# Patient Record
Sex: Female | Born: 1945 | Race: White | Hispanic: No | Smoking: Never smoker
Health system: Southern US, Community
[De-identification: ages and names within clinical notes are randomized; demographics above are authoritative.]

## PROBLEM LIST (undated history)

## (undated) DIAGNOSIS — G4730 Sleep apnea, unspecified: Secondary | ICD-10-CM

## (undated) DIAGNOSIS — M3500 Sicca syndrome, unspecified: Secondary | ICD-10-CM

## (undated) DIAGNOSIS — I509 Heart failure, unspecified: Secondary | ICD-10-CM

## (undated) DIAGNOSIS — E039 Hypothyroidism, unspecified: Secondary | ICD-10-CM

## (undated) DIAGNOSIS — F419 Anxiety disorder, unspecified: Secondary | ICD-10-CM

## (undated) DIAGNOSIS — H9190 Unspecified hearing loss, unspecified ear: Secondary | ICD-10-CM

## (undated) DIAGNOSIS — K219 Gastro-esophageal reflux disease without esophagitis: Secondary | ICD-10-CM

## (undated) DIAGNOSIS — M797 Fibromyalgia: Secondary | ICD-10-CM

## (undated) DIAGNOSIS — K629 Disease of anus and rectum, unspecified: Secondary | ICD-10-CM

## (undated) DIAGNOSIS — I7300 Raynaud's syndrome without gangrene: Secondary | ICD-10-CM

## (undated) DIAGNOSIS — R87629 Unspecified abnormal cytological findings in specimens from vagina: Secondary | ICD-10-CM

## (undated) DIAGNOSIS — R5383 Other fatigue: Secondary | ICD-10-CM

## (undated) DIAGNOSIS — F319 Bipolar disorder, unspecified: Secondary | ICD-10-CM

## (undated) DIAGNOSIS — M069 Rheumatoid arthritis, unspecified: Secondary | ICD-10-CM

## (undated) DIAGNOSIS — R102 Pelvic and perineal pain: Secondary | ICD-10-CM

## (undated) DIAGNOSIS — E785 Hyperlipidemia, unspecified: Secondary | ICD-10-CM

## (undated) DIAGNOSIS — I519 Heart disease, unspecified: Secondary | ICD-10-CM

## (undated) DIAGNOSIS — K589 Irritable bowel syndrome without diarrhea: Secondary | ICD-10-CM

## (undated) DIAGNOSIS — I209 Angina pectoris, unspecified: Secondary | ICD-10-CM

## (undated) DIAGNOSIS — F32A Depression, unspecified: Secondary | ICD-10-CM

## (undated) DIAGNOSIS — I2510 Atherosclerotic heart disease of native coronary artery without angina pectoris: Secondary | ICD-10-CM

## (undated) DIAGNOSIS — F329 Major depressive disorder, single episode, unspecified: Secondary | ICD-10-CM

## (undated) HISTORY — DX: Disease of anus and rectum, unspecified: K62.9

## (undated) HISTORY — DX: Hyperlipidemia, unspecified: E78.5

## (undated) HISTORY — DX: Pelvic and perineal pain: R10.2

## (undated) HISTORY — DX: Sleep apnea, unspecified: G47.30

## (undated) HISTORY — DX: Hypothyroidism, unspecified: E03.9

## (undated) HISTORY — DX: Heart failure, unspecified: I50.9

## (undated) HISTORY — DX: Bipolar disorder, unspecified: F31.9

## (undated) HISTORY — DX: Raynaud's syndrome without gangrene: I73.00

## (undated) HISTORY — DX: Heart disease, unspecified: I51.9

## (undated) HISTORY — DX: Fibromyalgia: M79.7

## (undated) HISTORY — DX: Gastro-esophageal reflux disease without esophagitis: K21.9

## (undated) HISTORY — DX: Other fatigue: R53.83

## (undated) HISTORY — DX: Major depressive disorder, single episode, unspecified: F32.9

## (undated) HISTORY — DX: Rheumatoid arthritis, unspecified: M06.9

## (undated) HISTORY — PX: TONSILLECTOMY: SUR1361

## (undated) HISTORY — DX: Depression, unspecified: F32.A

## (undated) HISTORY — PX: EYE SURGERY: SHX253

## (undated) HISTORY — DX: Unspecified abnormal cytological findings in specimens from vagina: R87.629

## (undated) HISTORY — DX: Anxiety disorder, unspecified: F41.9

---

## 2003-12-28 ENCOUNTER — Ambulatory Visit: Payer: Self-pay | Admitting: Internal Medicine

## 2004-04-24 ENCOUNTER — Ambulatory Visit: Payer: Self-pay | Admitting: Psychiatry

## 2004-05-14 ENCOUNTER — Ambulatory Visit: Payer: Self-pay | Admitting: Internal Medicine

## 2004-05-22 ENCOUNTER — Ambulatory Visit: Payer: Self-pay | Admitting: Internal Medicine

## 2004-05-28 ENCOUNTER — Ambulatory Visit: Payer: Self-pay | Admitting: Psychiatry

## 2004-06-03 ENCOUNTER — Ambulatory Visit: Payer: Self-pay | Admitting: Psychiatry

## 2004-06-04 ENCOUNTER — Inpatient Hospital Stay: Payer: Self-pay | Admitting: Psychiatry

## 2004-06-28 ENCOUNTER — Ambulatory Visit: Payer: Self-pay | Admitting: Psychiatry

## 2004-07-16 ENCOUNTER — Ambulatory Visit: Payer: Self-pay | Admitting: Psychiatry

## 2004-08-21 ENCOUNTER — Ambulatory Visit: Payer: Self-pay | Admitting: Psychiatry

## 2004-09-24 ENCOUNTER — Ambulatory Visit: Payer: Self-pay | Admitting: Psychiatry

## 2004-10-06 ENCOUNTER — Ambulatory Visit: Payer: Self-pay | Admitting: Internal Medicine

## 2004-12-01 ENCOUNTER — Ambulatory Visit: Payer: Self-pay | Admitting: Psychiatry

## 2004-12-04 ENCOUNTER — Ambulatory Visit: Payer: Self-pay | Admitting: Psychiatry

## 2004-12-22 ENCOUNTER — Ambulatory Visit: Payer: Self-pay

## 2005-01-05 ENCOUNTER — Ambulatory Visit: Payer: Self-pay | Admitting: Internal Medicine

## 2005-01-22 ENCOUNTER — Ambulatory Visit: Payer: Self-pay | Admitting: Internal Medicine

## 2005-01-22 ENCOUNTER — Ambulatory Visit: Payer: Self-pay | Admitting: Psychiatry

## 2005-02-24 ENCOUNTER — Ambulatory Visit: Payer: Self-pay

## 2005-03-18 ENCOUNTER — Ambulatory Visit: Payer: Self-pay | Admitting: Psychiatry

## 2005-03-23 ENCOUNTER — Inpatient Hospital Stay: Payer: Self-pay | Admitting: Psychiatry

## 2005-04-24 ENCOUNTER — Ambulatory Visit: Payer: Self-pay

## 2005-04-24 ENCOUNTER — Ambulatory Visit: Payer: Self-pay | Admitting: Internal Medicine

## 2005-04-30 ENCOUNTER — Ambulatory Visit: Payer: Self-pay | Admitting: Internal Medicine

## 2005-05-14 ENCOUNTER — Inpatient Hospital Stay: Payer: Self-pay | Admitting: Psychiatry

## 2005-05-27 ENCOUNTER — Ambulatory Visit: Payer: Self-pay | Admitting: Psychiatry

## 2005-07-25 ENCOUNTER — Ambulatory Visit: Payer: Self-pay | Admitting: Internal Medicine

## 2005-07-31 ENCOUNTER — Ambulatory Visit: Payer: Self-pay | Admitting: Internal Medicine

## 2005-08-27 ENCOUNTER — Ambulatory Visit: Payer: Self-pay | Admitting: Psychiatry

## 2005-09-02 ENCOUNTER — Ambulatory Visit: Payer: Self-pay

## 2005-09-29 ENCOUNTER — Ambulatory Visit: Payer: Self-pay | Admitting: Psychiatry

## 2005-10-27 ENCOUNTER — Ambulatory Visit: Payer: Self-pay | Admitting: Psychiatry

## 2005-11-17 ENCOUNTER — Ambulatory Visit: Payer: Self-pay | Admitting: Internal Medicine

## 2005-12-04 ENCOUNTER — Ambulatory Visit: Payer: Self-pay | Admitting: Psychiatry

## 2006-01-06 ENCOUNTER — Inpatient Hospital Stay: Payer: Self-pay | Admitting: Psychiatry

## 2006-01-19 ENCOUNTER — Emergency Department: Payer: Self-pay | Admitting: Emergency Medicine

## 2006-02-16 ENCOUNTER — Ambulatory Visit: Payer: Self-pay | Admitting: Internal Medicine

## 2006-03-29 ENCOUNTER — Ambulatory Visit: Payer: Self-pay | Admitting: Internal Medicine

## 2006-04-23 ENCOUNTER — Ambulatory Visit: Payer: Self-pay

## 2006-05-21 ENCOUNTER — Ambulatory Visit: Payer: Self-pay | Admitting: Psychiatry

## 2006-07-14 ENCOUNTER — Ambulatory Visit: Payer: Self-pay | Admitting: Psychiatry

## 2006-08-27 ENCOUNTER — Ambulatory Visit: Payer: Self-pay | Admitting: Internal Medicine

## 2006-10-09 ENCOUNTER — Inpatient Hospital Stay: Payer: Self-pay | Admitting: Unknown Physician Specialty

## 2006-10-28 ENCOUNTER — Inpatient Hospital Stay: Payer: Self-pay | Admitting: Psychiatry

## 2006-10-28 ENCOUNTER — Other Ambulatory Visit: Payer: Self-pay

## 2006-11-19 ENCOUNTER — Ambulatory Visit: Payer: Self-pay | Admitting: Podiatry

## 2006-11-26 ENCOUNTER — Ambulatory Visit: Payer: Self-pay | Admitting: Internal Medicine

## 2007-02-13 ENCOUNTER — Inpatient Hospital Stay: Payer: Self-pay | Admitting: Psychiatry

## 2007-03-21 ENCOUNTER — Ambulatory Visit: Payer: Self-pay

## 2007-04-11 ENCOUNTER — Ambulatory Visit: Payer: Self-pay

## 2007-04-17 ENCOUNTER — Emergency Department: Payer: Self-pay | Admitting: Emergency Medicine

## 2007-05-31 ENCOUNTER — Encounter: Payer: Self-pay | Admitting: Sports Medicine

## 2007-06-07 ENCOUNTER — Ambulatory Visit: Payer: Self-pay | Admitting: Neurology

## 2007-06-08 ENCOUNTER — Encounter: Payer: Self-pay | Admitting: Sports Medicine

## 2007-07-08 ENCOUNTER — Encounter: Payer: Self-pay | Admitting: Sports Medicine

## 2007-08-02 ENCOUNTER — Ambulatory Visit: Payer: Self-pay | Admitting: Cardiovascular Disease

## 2007-08-06 ENCOUNTER — Emergency Department: Payer: Self-pay | Admitting: Emergency Medicine

## 2007-08-06 ENCOUNTER — Other Ambulatory Visit: Payer: Self-pay

## 2007-09-02 ENCOUNTER — Inpatient Hospital Stay: Payer: Self-pay | Admitting: Podiatry

## 2008-02-23 ENCOUNTER — Ambulatory Visit: Payer: Self-pay | Admitting: Internal Medicine

## 2008-03-27 ENCOUNTER — Ambulatory Visit: Payer: Self-pay | Admitting: Internal Medicine

## 2008-06-27 ENCOUNTER — Inpatient Hospital Stay: Payer: Self-pay | Admitting: Psychiatry

## 2008-07-20 ENCOUNTER — Ambulatory Visit: Payer: Self-pay | Admitting: Urology

## 2008-07-25 ENCOUNTER — Ambulatory Visit: Payer: Self-pay | Admitting: Pain Medicine

## 2008-08-16 ENCOUNTER — Ambulatory Visit: Payer: Self-pay | Admitting: Internal Medicine

## 2008-08-21 ENCOUNTER — Ambulatory Visit: Payer: Self-pay | Admitting: Pain Medicine

## 2008-08-23 ENCOUNTER — Ambulatory Visit: Payer: Self-pay | Admitting: Unknown Physician Specialty

## 2008-09-14 ENCOUNTER — Inpatient Hospital Stay: Payer: Self-pay | Admitting: Podiatry

## 2008-10-23 ENCOUNTER — Ambulatory Visit: Payer: Self-pay | Admitting: Pain Medicine

## 2008-10-28 ENCOUNTER — Emergency Department: Payer: Self-pay | Admitting: Unknown Physician Specialty

## 2008-11-07 ENCOUNTER — Ambulatory Visit: Payer: Self-pay | Admitting: Physician Assistant

## 2008-11-27 ENCOUNTER — Ambulatory Visit: Payer: Self-pay | Admitting: Pain Medicine

## 2009-03-05 ENCOUNTER — Ambulatory Visit: Payer: Self-pay | Admitting: Internal Medicine

## 2010-04-07 ENCOUNTER — Ambulatory Visit: Payer: Self-pay | Admitting: Internal Medicine

## 2010-10-03 ENCOUNTER — Observation Stay: Payer: Self-pay | Admitting: Internal Medicine

## 2011-04-01 ENCOUNTER — Encounter: Payer: Self-pay | Admitting: Rheumatology

## 2011-04-10 ENCOUNTER — Encounter: Payer: Self-pay | Admitting: Rheumatology

## 2011-04-22 ENCOUNTER — Ambulatory Visit: Payer: Self-pay | Admitting: Internal Medicine

## 2011-05-08 ENCOUNTER — Encounter: Payer: Self-pay | Admitting: Rheumatology

## 2011-10-15 DIAGNOSIS — S61209A Unspecified open wound of unspecified finger without damage to nail, initial encounter: Secondary | ICD-10-CM | POA: Insufficient documentation

## 2011-10-15 DIAGNOSIS — L259 Unspecified contact dermatitis, unspecified cause: Secondary | ICD-10-CM | POA: Insufficient documentation

## 2011-12-14 ENCOUNTER — Ambulatory Visit: Payer: Self-pay | Admitting: Internal Medicine

## 2011-12-14 DIAGNOSIS — R05 Cough: Secondary | ICD-10-CM | POA: Insufficient documentation

## 2011-12-14 DIAGNOSIS — R059 Cough, unspecified: Secondary | ICD-10-CM | POA: Insufficient documentation

## 2012-01-07 DIAGNOSIS — Z23 Encounter for immunization: Secondary | ICD-10-CM | POA: Insufficient documentation

## 2012-01-07 DIAGNOSIS — E039 Hypothyroidism, unspecified: Secondary | ICD-10-CM | POA: Insufficient documentation

## 2012-03-15 ENCOUNTER — Ambulatory Visit: Payer: Self-pay | Admitting: Internal Medicine

## 2012-03-17 ENCOUNTER — Ambulatory Visit: Payer: Self-pay | Admitting: Internal Medicine

## 2012-04-28 ENCOUNTER — Ambulatory Visit: Payer: Self-pay | Admitting: Internal Medicine

## 2012-05-05 DIAGNOSIS — K219 Gastro-esophageal reflux disease without esophagitis: Secondary | ICD-10-CM | POA: Insufficient documentation

## 2012-07-19 ENCOUNTER — Encounter: Payer: Self-pay | Admitting: Rheumatology

## 2013-05-01 ENCOUNTER — Ambulatory Visit: Payer: Self-pay | Admitting: Family Medicine

## 2013-05-30 ENCOUNTER — Ambulatory Visit: Payer: Self-pay | Admitting: Gastroenterology

## 2013-06-22 DIAGNOSIS — M069 Rheumatoid arthritis, unspecified: Secondary | ICD-10-CM | POA: Insufficient documentation

## 2013-06-29 DIAGNOSIS — Z79899 Other long term (current) drug therapy: Secondary | ICD-10-CM | POA: Insufficient documentation

## 2014-01-10 DIAGNOSIS — I10 Essential (primary) hypertension: Secondary | ICD-10-CM | POA: Insufficient documentation

## 2014-01-10 DIAGNOSIS — I2510 Atherosclerotic heart disease of native coronary artery without angina pectoris: Secondary | ICD-10-CM | POA: Insufficient documentation

## 2014-05-02 ENCOUNTER — Ambulatory Visit: Payer: Self-pay | Admitting: Family Medicine

## 2014-05-28 ENCOUNTER — Emergency Department: Payer: Self-pay | Admitting: Emergency Medicine

## 2014-08-29 ENCOUNTER — Other Ambulatory Visit: Payer: Self-pay

## 2014-08-29 DIAGNOSIS — G894 Chronic pain syndrome: Secondary | ICD-10-CM | POA: Insufficient documentation

## 2014-08-29 DIAGNOSIS — M797 Fibromyalgia: Secondary | ICD-10-CM | POA: Insufficient documentation

## 2014-08-29 DIAGNOSIS — Z8719 Personal history of other diseases of the digestive system: Secondary | ICD-10-CM | POA: Insufficient documentation

## 2014-08-29 DIAGNOSIS — M069 Rheumatoid arthritis, unspecified: Secondary | ICD-10-CM | POA: Insufficient documentation

## 2014-08-29 DIAGNOSIS — M3500 Sicca syndrome, unspecified: Secondary | ICD-10-CM | POA: Insufficient documentation

## 2014-08-29 DIAGNOSIS — F411 Generalized anxiety disorder: Secondary | ICD-10-CM | POA: Insufficient documentation

## 2014-08-29 DIAGNOSIS — F3112 Bipolar disorder, current episode manic without psychotic features, moderate: Secondary | ICD-10-CM | POA: Insufficient documentation

## 2014-08-29 DIAGNOSIS — F3174 Bipolar disorder, in full remission, most recent episode manic: Secondary | ICD-10-CM | POA: Insufficient documentation

## 2014-08-29 DIAGNOSIS — F605 Obsessive-compulsive personality disorder: Secondary | ICD-10-CM | POA: Insufficient documentation

## 2014-08-29 DIAGNOSIS — F3173 Bipolar disorder, in partial remission, most recent episode manic: Secondary | ICD-10-CM | POA: Insufficient documentation

## 2014-08-29 DIAGNOSIS — F450 Somatization disorder: Secondary | ICD-10-CM | POA: Insufficient documentation

## 2014-08-29 DIAGNOSIS — Z8639 Personal history of other endocrine, nutritional and metabolic disease: Secondary | ICD-10-CM | POA: Insufficient documentation

## 2014-08-29 DIAGNOSIS — F319 Bipolar disorder, unspecified: Secondary | ICD-10-CM

## 2014-08-29 DIAGNOSIS — G4700 Insomnia, unspecified: Secondary | ICD-10-CM | POA: Insufficient documentation

## 2014-08-29 MED ORDER — DIVALPROEX SODIUM ER 500 MG PO TB24: 500.0000 mg | ORAL_TABLET | Freq: Two times a day (BID) | ORAL | Status: DC

## 2014-08-29 MED ORDER — CARBAMAZEPINE 100 MG PO CHEW: 600.0000 mg | CHEWABLE_TABLET | Freq: Every day | ORAL | Status: DC

## 2014-08-29 MED ORDER — GABAPENTIN 300 MG PO CAPS: 300.0000 mg | ORAL_CAPSULE | Freq: Two times a day (BID) | ORAL | Status: DC

## 2014-08-29 NOTE — Telephone Encounter (Signed)
Have provided 90 day refills on patient's gabapentin, carbamazepine and divalproex the patient has follow-up appointments scheduled.

## 2014-08-29 NOTE — Telephone Encounter (Signed)
pt needs refill on gabapentin , carbamazepine, divalproex.  pt made appt for 10-03-14

## 2014-08-30 ENCOUNTER — Other Ambulatory Visit: Payer: Self-pay

## 2014-08-30 MED ORDER — GABAPENTIN 300 MG PO CAPS: 300.0000 mg | ORAL_CAPSULE | Freq: Two times a day (BID) | ORAL | Status: DC

## 2014-08-30 MED ORDER — DIVALPROEX SODIUM ER 500 MG PO TB24: 500.0000 mg | ORAL_TABLET | Freq: Two times a day (BID) | ORAL | Status: DC

## 2014-08-30 MED ORDER — ILOPERIDONE 6 MG PO TABS: 0.5000 | ORAL_TABLET | Freq: Every day | ORAL | Status: DC

## 2014-08-30 NOTE — Telephone Encounter (Signed)
needs refill on divalproex sodium er 500mg   take 1 table two times a day

## 2014-09-12 ENCOUNTER — Telehealth: Payer: Self-pay | Admitting: Obstetrics and Gynecology

## 2014-09-12 NOTE — Telephone Encounter (Signed)
WANTED TO FIND OUT HER DIAGNOSES OF WHAT DR DE TOLD HER ABOUT HER BX. [place] PHONE FIRST

## 2014-09-12 NOTE — Telephone Encounter (Signed)
Pt given info about her gluteal bx from 04/2013. Per her request I mailed a copy of the pathology.

## 2014-09-24 ENCOUNTER — Telehealth: Payer: Self-pay

## 2014-09-24 DIAGNOSIS — R0681 Apnea, not elsewhere classified: Secondary | ICD-10-CM | POA: Insufficient documentation

## 2014-09-24 NOTE — Telephone Encounter (Signed)
pt called states that she has a up coming dental appt where she had got to get some work done and she states she did not have a good experience with one she had done before but she going to see a different dentist this time but she still feels increase anxiety, fatigue, and weakness and she wonders if it is her anxiety working on her can you advise if she needs to increase her medication during this time period ?

## 2014-09-25 NOTE — Telephone Encounter (Signed)
Spoke with patient today about her issue. She indicated her plan would be to use the lorazepam twice a day as it is written. He states she might make this increase sometime around July 21 because her dental appointment is on July 22. She states in the past she was typically taking 1 pill once a day. She stated as the message indicated that she is having some increased anxiety from her upcoming dental appointment given she has had bad experience with a dentist recently. She does state she is under the care of her urologist and they're planning some workup fair. She states that her symptoms of anxiety are fatigue, anxiety. Was aware that the symptoms she is having could be symptoms of both, however she has some upcoming tests with her cardiologist for which she states her be a chemically induced stress test. I encouraged her to call any questions concerns prior to her next point. AW

## 2014-10-03 ENCOUNTER — Encounter: Payer: Self-pay | Admitting: Psychiatry

## 2014-10-03 ENCOUNTER — Ambulatory Visit (INDEPENDENT_AMBULATORY_CARE_PROVIDER_SITE_OTHER): Payer: 59 | Admitting: Psychiatry

## 2014-10-03 VITALS — BP 104/70 | HR 78 | Temp 98.5°F | Ht 60.0 in | Wt 114.4 lb

## 2014-10-03 DIAGNOSIS — E782 Mixed hyperlipidemia: Secondary | ICD-10-CM | POA: Insufficient documentation

## 2014-10-03 DIAGNOSIS — F3110 Bipolar disorder, current episode manic without psychotic features, unspecified: Secondary | ICD-10-CM | POA: Diagnosis not present

## 2014-10-03 NOTE — Progress Notes (Signed)
[facility] MD/PA/NP OP Progress Note  10/03/2014 11:06 AM Stephanie [name]  MRN:  [id number]  Subjective:  Patient returns for follow-up of her bipolar disorder and generalized anxiety disorder. She states overall things are going well and she denies any side effects or medications. She stated the most recent stressor is that last week on the 21st she fell in a ditch while she was trying to retrieve her mail. She states that the mailboxes and a ditch. I did spend some time discussing with her benzodiazepines and potentially affecting balance. However, it appears the current situation was secondary to positioning issue related to her mailbox. She asked about perhaps getting some physician support for having her mailbox moved. I encouraged her to investigate with the post office what options there are and if there possibilities for handicapped access to [place]. I stated if she did discover any paperwork we would then have to decide whether it would make more sense for her primary care to address any physical limitations versus this Probation officer.  He related that her dental experience last week was one of the best of her life. She states that she felt like the [place] she saw saved one of her teeth and cleaned stains off of some other teeth without any charge. She states she was pleased with the experience. She states otherwise she continues her routine of swimming and being active. Chief Complaint: "fell" Chief Complaint    Follow-up; Anxiety     Visit Diagnosis:  No diagnosis found.  Past Medical History:  Past Medical History  Diagnosis Date  . Anxiety   . Bipolar disorder     Past Surgical History  Procedure Laterality Date  . Tonsillectomy     Family History:  Family History  Problem Relation Age of Onset  . Heart failure Father   . Parkinson's disease Mother   . Bipolar disorder Mother   . Depression Sister   . Schizophrenia Paternal Grandmother    Social History:  History   Social History   . Marital Status: Divorced    Spouse Name: N/A  . Number of Children: N/A  . Years of Education: N/A   Social History Main Topics  . Smoking status: Never Smoker   . Smokeless tobacco: Former Systems developer  . Alcohol Use: No  . Drug Use: No  . Sexual Activity: No   Other Topics Concern  . None   Social History Narrative  . None   Additional History:   Assessment:   Musculoskeletal: Strength & Muscle Tone: within normal limits Gait & Station: unsteady, Walking with a cane Patient leans: N/A  Psychiatric Specialty Exam: HPI  ROS  Blood pressure 104/70, pulse 78, temperature 98.5 F (36.9 C), temperature source Tympanic, height 5' (1.524 m), weight 114 lb 6.4 oz (51.891 kg), SpO2 92 %.Body mass index is 22.34 kg/(m^2).  General Appearance: Neat and Well Groomed  Eye Contact:  Good  Speech:  Normal Rate  Volume:  Normal  Mood:  Good  Affect:  Congruent  Thought Process:  Linear and Logical  Orientation:  Full (Time, Place, and Person)  Thought Content:  Negative  Suicidal Thoughts:  No  Homicidal Thoughts:  No  Memory:  Immediate;   Good Recent;   Good Remote;   Good  Judgement:  Good  Insight:  Good  Psychomotor Activity:  Negative  Concentration:  Good  Recall:  Good  Fund of Knowledge: Good  Language: Good  Akathisia:  Negative  Handed:  Right unknown  AIMS (if indicated):  Done in April 2016, normal  Assets:  Armed forces logistics/support/administrative officer Desire for Improvement Social Support  ADL's:  Intact  Cognition: WNL  Sleep:  good   Is the patient at risk to self?  No. Has the patient been a risk to self in the past 6 months?  No. Has the patient been a risk to self within the distant past?  No. Is the patient a risk to others?  No. Has the patient been a risk to others in the past 6 months?  No. Has the patient been a risk to others within the distant past?  No.  Current Medications: Current Outpatient Prescriptions  Medication Sig Dispense Refill  . abatacept  (ORENCIA) 250 MG injection Inject into the vein.    Marland Kitchen amoxicillin (AMOXIL) 500 MG capsule     . aspirin 81 MG tablet Take by mouth.    Marland Kitchen atorvastatin (LIPITOR) 20 MG tablet Take by mouth.    . Calcium Citrate-Vitamin D ([employer] CALCIUM CITRATE +D3 MINI) 200-250 MG-UNIT TABS Take by mouth.    Marland Kitchen CALCIUM CITRATE-VITAMIN D Take by mouth.    . carbamazepine (TEGRETOL) 100 MG chewable tablet Chew 6 tablets (600 mg total) by mouth at bedtime. 540 tablet 0  . divalproex (DEPAKOTE ER) 500 MG 24 hr tablet Take 1 tablet (500 mg total) by mouth 2 (two) times daily. 465 tablet 0  . folic acid (FOLVITE) 1 MG tablet     . gabapentin (NEURONTIN) 300 MG capsule Take 1 capsule (300 mg total) by mouth 2 (two) times daily. 180 capsule 0  . Iloperidone (FANAPT) 6 MG TABS Take 0.5 tablets (3 mg total) by mouth at bedtime. 45 tablet 0  . levothyroxine (SYNTHROID) 75 MCG tablet Take by mouth.    Marland Kitchen LORazepam (ATIVAN) 0.5 MG tablet Take by mouth.    . methotrexate 2.5 MG tablet Take by mouth.    . MULTIPLE VITAMIN PO Take by mouth.    . nitroGLYCERIN (NITROSTAT) 0.4 MG SL tablet Place under the tongue.    . ondansetron (ZOFRAN) 8 MG tablet Take by mouth.    . ranitidine (ZANTAC) 150 MG tablet Take by mouth.    . risedronate (ACTONEL) 150 MG tablet Take by mouth.    . traZODone (DESYREL) 50 MG tablet Take by mouth.    . carbamazepine (TEGRETOL-XR) 100 MG 12 hr tablet Take by mouth.     No current facility-administered medications for this visit.    Medical Decision Making:  Established Problem, Stable/Improving (1) and Review of Medication Regimen & Side Effects (2)  Treatment Plan Summary:Medication management and Plan We'll continue patient on her medications without any changes. She will continue the trazodone 75 mg at bedtime, Neurontin 300 mg twice daily, Ativan 0.5 mg twice daily as needed, Fanapt 3 mg daily, Tegretol chewable tablet 600 mg at bedtime and she'll follow up in 3 months. She's been encouraged call  any questions or concerns prior to her next appointment.   [name] 10/03/2014, 11:06 AM

## 2014-10-15 ENCOUNTER — Other Ambulatory Visit: Payer: Self-pay

## 2014-10-15 DIAGNOSIS — G4700 Insomnia, unspecified: Secondary | ICD-10-CM

## 2014-10-15 MED ORDER — TRAZODONE HCL 50 MG PO TABS: ORAL_TABLET | ORAL | Status: DC

## 2014-10-15 NOTE — Telephone Encounter (Signed)
Received another request for trazondone.

## 2014-10-15 NOTE — Telephone Encounter (Signed)
Ordered 90 day supply of trazodone. Trazodone 50 mg #135, no refills.

## 2014-10-15 NOTE — Telephone Encounter (Signed)
received a request for medication refill on trazodone hcl 50 mg tabe #135 take 1 and 1/2 tabets by mouth at bedtimes. pt last seen o 10-03-14 next appt  01-02-15.

## 2014-10-17 ENCOUNTER — Other Ambulatory Visit: Payer: Self-pay

## 2014-10-17 MED ORDER — ILOPERIDONE 6 MG PO TABS: 0.5000 | ORAL_TABLET | Freq: Every day | ORAL | Status: DC

## 2014-10-17 NOTE — Telephone Encounter (Signed)
left message that rx was sent in

## 2014-10-17 NOTE — Telephone Encounter (Signed)
pt called left message on lea phone that she needed refill on fanapt -generic illiperidone.  Pt last seen on 10-03-14 next appt 01-02-15

## 2014-10-18 ENCOUNTER — Telehealth: Payer: Self-pay | Admitting: Psychiatry

## 2014-10-19 MED ORDER — ILOPERIDONE 6 MG PO TABS: 0.5000 | ORAL_TABLET | Freq: Every day | ORAL | Status: DC

## 2014-10-19 NOTE — Telephone Encounter (Signed)
Patient has indicated she's aware that this is generic available for her [place]. He would like to try generic due to cost saving issues. I've put a note to use generic on her prescription. She does not need any of the medication at this time as she recently got it refilled one week ago. However the note is on her prescription when it is sent through the next time. AW

## 2014-10-22 ENCOUNTER — Telehealth: Payer: Self-pay

## 2014-10-22 MED ORDER — QUETIAPINE FUMARATE 25 MG PO TABS: 25.0000 mg | ORAL_TABLET | Freq: Every day | ORAL | Status: DC

## 2014-10-22 NOTE — Telephone Encounter (Signed)
pt states that the fanapt it is $120 and she can not afford it.  the pharmacy said that seroquel maybe more affordable if dr. Jimmye Norman wanted to try that instead.  please advise what to do about pt fanapt can not afford.?

## 2014-10-22 NOTE — Telephone Encounter (Signed)
Spoke with patient and she indicated that the cost of the [place] is too much for her. She explored the other tier 1 options with her insurance coming in these included olanzapine, haloperidol and quetiapine. 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 I discussed that of the 3 at this time the best choice would be quetiapine. We'll start 25 mg at bedtime. Patient is been encouraged to contact the clinic and make an earlier appointment than are currently scheduled on October appointment. She indicated she would do this. AW

## 2014-10-22 NOTE — Telephone Encounter (Signed)
the prior authorization was approved but the tier reductions was not approved. Pt will have to pay full price for medication

## 2014-10-22 NOTE — Telephone Encounter (Signed)
spoke with patient she has about a 8 day supply left.

## 2014-11-02 ENCOUNTER — Telehealth: Payer: Self-pay

## 2014-11-02 NOTE — Telephone Encounter (Signed)
pt wants to find out if she should stay on the ativan or start tapering off of it.

## 2014-11-05 NOTE — Telephone Encounter (Signed)
pt was given instructions

## 2014-11-08 ENCOUNTER — Other Ambulatory Visit: Payer: Self-pay

## 2014-11-08 MED ORDER — CARBAMAZEPINE 100 MG PO CHEW: 600.0000 mg | CHEWABLE_TABLET | Freq: Every day | ORAL | Status: DC

## 2014-11-08 NOTE — Telephone Encounter (Signed)
request for refill on carbamazepine 100mg 

## 2014-11-08 NOTE — Telephone Encounter (Signed)
Medication has been ordered, carbamazepine chewable 100 mg, #540 with no refills. AW

## 2014-11-09 NOTE — Telephone Encounter (Signed)
busy no message could be left.

## 2014-12-05 ENCOUNTER — Ambulatory Visit (INDEPENDENT_AMBULATORY_CARE_PROVIDER_SITE_OTHER): Payer: 59 | Admitting: Psychiatry

## 2014-12-05 ENCOUNTER — Encounter: Payer: Self-pay | Admitting: Psychiatry

## 2014-12-05 VITALS — BP 118/76 | HR 67 | Temp 97.5°F | Ht 60.0 in | Wt 116.6 lb

## 2014-12-05 DIAGNOSIS — G4700 Insomnia, unspecified: Secondary | ICD-10-CM

## 2014-12-05 MED ORDER — TRAZODONE HCL 50 MG PO TABS
ORAL_TABLET | ORAL | Status: DC
Start: 2014-12-05 — End: 2015-02-20

## 2014-12-05 MED ORDER — QUETIAPINE FUMARATE 25 MG PO TABS: 25.0000 mg | ORAL_TABLET | Freq: Every day | ORAL | Status: DC

## 2014-12-05 MED ORDER — LORAZEPAM 0.5 MG PO TABS: ORAL_TABLET | ORAL | Status: DC

## 2014-12-05 NOTE — Progress Notes (Signed)
[facility] MD/PA/NP OP Progress Note  12/05/2014 10:53 AM Stephanie [name]  MRN:  [id number]  Subjective:  Patient returns for follow-up of her bipolar disorder and generalized anxiety disorder. She states things continue to go well. She states she continues to be physically active doing swimming 5 times a week. She states that her sleep is good. Due to cost we had switched the patient from her for nap to [place] ". She states things are going well with the Seroquel. She does state that when she took the Seroquel with her trazodone she was too sedated and groggy the next day and that she cut her trazodone dose down to 25 mg at bedtime.  I discussed with her that ideally it would be best if we could get her off of the Ativan. We had increased the dose when she was dealing with significant anxiety related to dental care. However she states she's found a new dentist and is no longer having that issue. I discussed with her that she could perhaps take a half of her 0.5 mg tablet for 2 weeks and then simply have it to use as needed.  I discussed with her I need to get some metabolic labs, Depakote level and Tegretol level. I've given her a laboratory slip to do so. She did she has an upcoming physical with her primary care physician and perhaps could get these done then. I stated that if she had this exam coming up within the next month or 2 that would be fine but if not she should go have the labs assessed regardless of the appointment. Chief Complaint: good  Visit Diagnosis:     ICD-9-CM ICD-10-CM   1. 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 Anxiety   . Bipolar disorder     Past Surgical History  Procedure Laterality Date  . Tonsillectomy     Family History:  Family History  Problem Relation Age of Onset  . Heart failure Father   . Parkinson's disease Mother   . Bipolar  disorder Mother   . Depression Sister   . Schizophrenia Paternal Grandmother    Social History:  Social History   Social History  . Marital Status: Divorced    Spouse Name: N/A  . Number of Children: N/A  . Years of Education: N/A   Social History Main Topics  . Smoking status: Never Smoker   . Smokeless tobacco: Former Systems developer  . Alcohol Use: No  . Drug Use: No  . Sexual Activity: No   Other Topics Concern  . None   Social History Narrative   Additional History:   Assessment:   Musculoskeletal: Strength & Muscle Tone: within normal limits Gait & Station: unsteady, Walking with a cane Patient leans: N/A  Psychiatric Specialty Exam: HPI  Review of Systems  Psychiatric/Behavioral: Negative for depression, suicidal ideas, hallucinations, memory loss and substance abuse. The patient is not nervous/anxious and does not have insomnia.     Blood pressure 118/76, pulse 67, temperature 97.5 F (36.4 C), temperature source Tympanic, height 5' (1.524 m), weight 116 lb 9.6 oz (52.889 kg), SpO2 93 %.Body mass index is 22.77 kg/(m^2).  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  No. Has the patient been a risk to self in the past 6 months?  No. Has the patient been a risk  to self within the distant past?  No. Is the patient a risk to others?  No. Has the patient been a risk to others in the past 6 months?  No. Has the patient been a risk to others within the distant past?  No.  Current Medications: Current Outpatient Prescriptions  Medication Sig Dispense Refill  . abatacept (ORENCIA) 250 MG injection Inject into the vein.    Marland Kitchen aspirin 81 MG tablet Take by mouth.    . Calcium Citrate-Vitamin D (CVS CALCIUM CITRATE +D3 MINI) 200-250 MG-UNIT TABS Take by mouth.    . carbamazepine (TEGRETOL) 100 MG chewable tablet Chew 6 tablets (600 mg total) by mouth at bedtime. 540 tablet 0  . divalproex (DEPAKOTE ER) 500 MG 24 hr tablet Take 1 tablet (500 mg total) by mouth 2 (two) times daily. 161 tablet 0  . folic acid (FOLVITE) 1 MG tablet     . gabapentin (NEURONTIN) 300 MG capsule Take 1 capsule (300 mg total) by mouth 2 (two) times daily. 180 capsule 0  . levothyroxine (SYNTHROID) 75 MCG tablet Take by mouth.    . methotrexate 2.5 MG tablet Take by mouth.    . MULTIPLE VITAMIN PO Take by mouth.    . nitroGLYCERIN (NITROSTAT) 0.4 MG SL tablet Place under the tongue.    . ondansetron (ZOFRAN) 8 MG tablet Take by mouth.    . QUEtiapine (SEROQUEL) 25 MG tablet Take 1 tablet (25 mg total) by mouth at bedtime. 90 tablet 0  . ranitidine (ZANTAC) 150 MG tablet Take by mouth.    . risedronate (ACTONEL) 150 MG tablet Take by mouth.    . traZODone (DESYREL) 50 MG tablet Take one half a tablet at bedtime as needed for sleep. 90 tablet 0  . LORazepam (ATIVAN) 0.5 MG tablet Take one half a tablet daily for 14 days, THEN use one half a tablet daily AS NEEDED 90 tablet 0   No current facility-administered medications for this visit.    Medical Decision Making:  Established Problem, Stable/Improving (1) and Review of Medication Regimen & Side Effects (2)  Treatment Plan Summary:Medication management and Plan We'll continue patient on her medications without any  changes.  Bipolar disorder- is artery decreased her trazodone 25 mg at bedtime. Continue Neurontin 300 mg twice daily,  Fanapt 3 mg daily, Tegretol chewable tablet 600 mg at bedtime, Depakote 500 mg twice daily and she'll follow up in 3 months. We will continue her at [place] 25 mg at bedtime. Continue this low dose that she reports some sedation and she is on several other mood stabilizers at this time for adequate mood stability.  GAD-I've instructed her to decrease her dose of lorazepam  from 0.5 mg daily to 0.25 millions daily for 14 days and then discontinue regular use of it but continue with a 0.25 dose as needed  Given her a laboratory slip for metabolic labs, Depakote level and Tegretol level.  She's been encouraged call any questions or concerns prior to her next appointment.   [name] 12/05/2014, 10:53 AM

## 2014-12-26 ENCOUNTER — Ambulatory Visit (INDEPENDENT_AMBULATORY_CARE_PROVIDER_SITE_OTHER): Payer: 59 | Admitting: Obstetrics and Gynecology

## 2014-12-26 ENCOUNTER — Encounter: Payer: Self-pay | Admitting: Obstetrics and Gynecology

## 2014-12-26 VITALS — BP 102/68 | HR 68 | Ht 61.0 in | Wt 114.1 lb

## 2014-12-26 DIAGNOSIS — D071 Carcinoma in situ of vulva: Secondary | ICD-10-CM

## 2014-12-26 NOTE — Patient Instructions (Signed)
1. Normal vulvar colposcopy. 2. Return in 1 year for final colposcopy.

## 2014-12-26 NOTE — Progress Notes (Signed)
Chief complaint: 1.  History of severe vulvar dysplasia, status post wide local excision.  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  The patient remains asymptomatic without vulvar itching, burning, or pain.  She has not noted any new lesions on her perineum.  Past medical history, past surgical history, problem list, medications, and allergies are reviewed.  [place] it.  Review of systems: Per HPI.  OBJECTIVE: BP 102/68 mmHg  Pulse 68  Ht 5\' 1"  (1.549 m)  Wt 114 lb 1.6 oz (51.755 kg)  BMI 21.57 kg/m2 Patient is a pleasant elderly female in no acute distress. Pelvic exam: External genitalia-with atrophic changes; no gross lesions. BUS: Normal. Vagina: Atrophic changes.  PROCEDURE: Vulvar colposcopy. Indications: History of VIN 3; status post wide local excision 2015 Findings: No lesions on gross inspection; normal vulvar colposcopy without evidence of abnormality  IMPRESSION: 1.  History of VIN 3, status post wide local excision. 2.  Adequate colposcopy today without evidence of disease.  PLAN: 1.  Return in 1 year for final vulvar colposcopy.  If normal, will return patient to routine annual screening.  A total of 15 minutes were spent face-to-face with the patient during this encounter and over half of that time dealt with counseling and coordination of care.   Brayton Mars, MD  Note: This dictation was prepared with Dragon dictation along with smaller phrase technology. Any transcriptional errors that result from this process are unintentional.

## 2015-01-02 ENCOUNTER — Ambulatory Visit: Payer: Self-pay | Admitting: Psychiatry

## 2015-01-07 ENCOUNTER — Telehealth: Payer: Self-pay | Admitting: Psychiatry

## 2015-01-21 NOTE — Telephone Encounter (Signed)
the [facility] fax # [phone]

## 2015-01-21 NOTE — Telephone Encounter (Signed)
lab orders faxed - confirmed

## 2015-02-05 ENCOUNTER — Telehealth: Payer: Self-pay

## 2015-02-05 NOTE — Telephone Encounter (Signed)
pt wanted to make sure that it was ok to increase her trazodone to 50mg  . pt states that she wake up at night and can not get back to sleep.  pt states that her mind is racing and she has racing thoughts.

## 2015-02-07 NOTE — Telephone Encounter (Signed)
spoke with patient.  pt states that she will increase only on the night she can get to sleep good.

## 2015-02-20 ENCOUNTER — Encounter: Payer: Self-pay | Admitting: Psychiatry

## 2015-02-20 ENCOUNTER — Ambulatory Visit (INDEPENDENT_AMBULATORY_CARE_PROVIDER_SITE_OTHER): Payer: 59 | Admitting: Psychiatry

## 2015-02-20 ENCOUNTER — Telehealth: Payer: Self-pay

## 2015-02-20 ENCOUNTER — Telehealth: Payer: Self-pay | Admitting: Psychiatry

## 2015-02-20 ENCOUNTER — Ambulatory Visit: Payer: 59 | Admitting: Psychiatry

## 2015-02-20 VITALS — BP 122/78 | HR 77 | Temp 97.7°F | Ht 61.0 in | Wt 112.2 lb

## 2015-02-20 DIAGNOSIS — G4700 Insomnia, unspecified: Secondary | ICD-10-CM | POA: Diagnosis not present

## 2015-02-20 DIAGNOSIS — M3500 Sicca syndrome, unspecified: Secondary | ICD-10-CM | POA: Insufficient documentation

## 2015-02-20 MED ORDER — CARBAMAZEPINE 100 MG PO CHEW: 600.0000 mg | CHEWABLE_TABLET | Freq: Every day | ORAL | Status: DC

## 2015-02-20 MED ORDER — TRAZODONE HCL 50 MG PO TABS: ORAL_TABLET | ORAL | Status: DC

## 2015-02-20 MED ORDER — LORAZEPAM 0.5 MG PO TABS: 0.2500 mg | ORAL_TABLET | Freq: Every evening | ORAL | Status: DC | PRN

## 2015-02-20 MED ORDER — QUETIAPINE FUMARATE 25 MG PO TABS: 25.0000 mg | ORAL_TABLET | Freq: Every day | ORAL | Status: DC

## 2015-02-20 MED ORDER — GABAPENTIN 300 MG PO CAPS: 300.0000 mg | ORAL_CAPSULE | Freq: Two times a day (BID) | ORAL | Status: DC

## 2015-02-20 MED ORDER — DIVALPROEX SODIUM ER 500 MG PO TB24: 500.0000 mg | ORAL_TABLET | Freq: Two times a day (BID) | ORAL | Status: DC

## 2015-02-20 NOTE — Progress Notes (Signed)
BH MD/PA/NP OP Progress Note  02/20/2015 12:10 PM Stephanie [name]  MRN:  [id number]  Subjective:  Patient returns for follow-up of her bipolar disorder and generalized anxiety disorder. Psych she feels like the medications have kept her mood stable. She discusses some stress from medical issues. She states that she was having some pain and she thought it was her osteoporosis however she states she's found out it scoliosis. She is getting follow-up for that and does have appointments for some physical therapy.  She states that at this time she is continue take her carbamazepine and Depakote. She states that the trazodone she is only requiring 25 mg at bedtime. We discussed the lorazepam. She states she is only taking a half of a 0.5 mg tablet at bedtime. Because of her issues with falls we have discussed in the past that ultimately been good for her to get off of the medicine. We have agreed that she would continue to have a prescription but only use the 0.25 mg as needed. Patient is agreeable to this and feels since the trazodone is working she may not need any lorazepam at night. Chief Complaint: good Chief Complaint    Follow-up; Medication Refill     Visit Diagnosis:     ICD-9-CM ICD-10-CM   1. 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 Anxiety   . Bipolar disorder ([place])   . Fibromyalgia   . Hypothyroidism   . Heart failure ([place])   . Sleep apnea   . Fatigue   . Vaginal Pap smear, abnormal   . Heart disease   . HLD (hyperlipidemia)   . Raynaud phenomenon   . Bipolar 1 disorder ([place])   . Depression   . GERD (gastroesophageal reflux disease)   . RA (rheumatoid arthritis) ([place])   . Perianal lesion     high grade sil lesion- keratinizing type  . Pelvic pain in female     Past Surgical History  Procedure Laterality Date  . Tonsillectomy     Family  History:  Family History  Problem Relation Age of Onset  . Heart failure Father   . Parkinson's disease Mother   . Bipolar disorder Mother   . Depression Sister   . Schizophrenia Paternal Grandmother   . Diabetes Maternal Grandmother   . Pancreatic cancer Maternal Grandmother   . Cancer Neg Hx   . Heart disease Neg Hx    Social History:  Social History   Social History  . Marital Status: Divorced    Spouse Name: N/A  . Number of Children: N/A  . Years of Education: N/A   Social History Main Topics  . Smoking status: Never Smoker   . Smokeless tobacco: Former Systems developer  . Alcohol Use: No  . Drug Use: No  . Sexual Activity: No   Other Topics Concern  . None   Social History Narrative   Additional History:   Assessment:   Musculoskeletal: Strength & Muscle Tone: within normal limits Gait & Station: unsteady, Walking with a cane Patient leans: N/A  Psychiatric Specialty Exam: HPI  Review of Systems  Psychiatric/Behavioral: Negative for depression, suicidal ideas, hallucinations, memory loss and substance abuse. The patient is not nervous/anxious and does not have insomnia.   All other systems reviewed and are negative.   Blood pressure 122/78, pulse 77, temperature 97.7 F (36.5 C), temperature source Tympanic, height  5\' 1"  (1.549 m), weight 112 lb 3.2 oz (50.894 kg), SpO2 96 %.Body mass index is 21.21 kg/(m^2).  General Appearance: Neat and Well Groomed  Eye Contact:  Good  Speech:  Normal Rate  Volume:  Normal  Mood:  Good  Affect:  Congruent  Thought Process:  Linear and Logical  Orientation:  Full (Time, Place, and Person)  Thought Content:  Negative  Suicidal Thoughts:  No  Homicidal Thoughts:  No  Memory:  Immediate;   Good Recent;   Good Remote;   Good  Judgement:  Good  Insight:  Good  Psychomotor Activity:  Negative  Concentration:  Good  Recall:  Good  Fund of Knowledge: Good  Language: Good  Akathisia:  Negative  Handed:  Right unknown    AIMS (if indicated):  Done in April 2016, normal  Assets:  Armed forces logistics/support/administrative officer Desire for Improvement Social Support  ADL's:  Intact  Cognition: WNL  Sleep:  good   Is the patient at risk to self?  No. Has the patient been a risk to self in the past 6 months?  No. Has the patient been a risk to self within the distant past?  No. Is the patient a risk to others?  No. Has the patient been a risk to others in the past 6 months?  No. Has the patient been a risk to others within the distant past?  No.  Current Medications: Current Outpatient Prescriptions  Medication Sig Dispense Refill  . abatacept (ORENCIA) 250 MG injection Inject into the vein.    Marland Kitchen aspirin 81 MG tablet Take by mouth.    . Calcium Citrate-Vitamin D ([employer] CALCIUM CITRATE +D3 MINI) 200-250 MG-UNIT TABS Take by mouth.    . carbamazepine (TEGRETOL) 100 MG chewable tablet Chew 6 tablets (600 mg total) by mouth at bedtime. 540 tablet 1  . divalproex (DEPAKOTE ER) 500 MG 24 hr tablet Take 1 tablet (500 mg total) by mouth 2 (two) times daily. [id number] tablet 1  . folic acid (FOLVITE) 1 MG tablet     . gabapentin (NEURONTIN) 300 MG capsule Take 1 capsule (300 mg total) by mouth 2 (two) times daily. 180 capsule 0  . levothyroxine (SYNTHROID) 75 MCG tablet Take by mouth.    Marland Kitchen LORazepam (ATIVAN) 0.5 MG tablet Take 0.5 tablets (0.25 mg total) by mouth at bedtime as needed for anxiety. 45 tablet 1  . methotrexate 2.5 MG tablet Take by mouth.    . MULTIPLE VITAMIN PO Take by mouth.    . nitroGLYCERIN (NITROSTAT) 0.4 MG SL tablet Place under the tongue.    . ondansetron (ZOFRAN) 8 MG tablet Take by mouth.    . QUEtiapine (SEROQUEL) 25 MG tablet Take 1 tablet (25 mg total) by mouth at bedtime. 90 tablet 1  . ranitidine (ZANTAC) 150 MG tablet Take by mouth.    . traZODone (DESYREL) 50 MG tablet Take one half a tablet at bedtime as needed for sleep. 45 tablet 1   No current facility-administered medications for this visit.    Medical  Decision Making:  Established Problem, Stable/Improving (1) and Review of Medication Regimen & Side Effects (2)  Treatment Plan Summary:Medication management and Plan We'll continue patient on her medications without any changes.  Bipolar disorder- is artery decreased her trazodone 25 mg at bedtime. Continue Neurontin 300 mg twice daily,  Tegretol chewable tablet 600 mg at bedtime, Depakote 500 mg twice daily and she'll follow up in 3 months. We will continue her Seroquel 25  mg at bedtime. Continue this low dose that she reports some sedation and she is on several other mood stabilizers at this time for adequate mood stability.  GAD-I've instructed her to decrease her dose of lorazepam 0.25 mg at bedtime as needed.  She's been encouraged call any questions or concerns prior to her next appointment.   [name] 02/20/2015, 12:10 PM

## 2015-02-20 NOTE — Telephone Encounter (Signed)
faxed and confirmed rx for ativan .5mg  id # [id number] order # [id number] sent to pharmacy.

## 2015-02-25 DIAGNOSIS — M810 Age-related osteoporosis without current pathological fracture: Secondary | ICD-10-CM | POA: Insufficient documentation

## 2015-02-25 NOTE — Telephone Encounter (Signed)
left patient a message to call our office back.  (need to find out if patient had labwork done at [facility] if so pt need to sign a release)

## 2015-02-25 NOTE — Telephone Encounter (Signed)
check the care everywhere labwork to see if it is there

## 2015-02-25 NOTE — Telephone Encounter (Signed)
called again about labwork looking for a liver / lipid panel and epakoke level.  per the lab. pt had not had it done there.  the only thing they have is on  01-25-15 a carbamazepine and then on aug 6 and 9th pt had a urine culture done.

## 2015-02-25 NOTE — Telephone Encounter (Signed)
called [facility] about labwork results, they are suppose to be faxing again

## 2015-02-25 NOTE — Telephone Encounter (Signed)
labwork received- pending your review

## 2015-02-25 NOTE — Telephone Encounter (Signed)
pt was wanting to find out if you got the results of her labwork yet.

## 2015-02-26 ENCOUNTER — Telehealth: Payer: Self-pay | Admitting: Psychiatry

## 2015-02-26 NOTE — Telephone Encounter (Signed)
Spoke with patient discussed her laboratory values. Her carbamazepine was in the normal range at 8.1, her Depakote level was 73. CBC was within normal range except for slightly elevated MCV at 104. However it appears she's had a slightly elevated MCV over the past year. Her LFTs were within normal range. Her glucose was 83. Her lipid panel was within normal limits except for elevated total cholesterol 243. He indicated her primary care was aware of the elevated cholesterol levels. AW

## 2015-02-27 ENCOUNTER — Ambulatory Visit: Payer: 59 | Admitting: Psychiatry

## 2015-03-06 ENCOUNTER — Ambulatory Visit: Payer: Medicare Other | Attending: Rheumatology

## 2015-03-06 ENCOUNTER — Ambulatory Visit: Payer: 59 | Admitting: Psychiatry

## 2015-03-06 DIAGNOSIS — M545 Low back pain, unspecified: Secondary | ICD-10-CM

## 2015-03-06 NOTE — Therapy (Signed)
[place] MAIN [facility] SERVICES [address] [place], [place], [address] Phone: [phone]   Fax:  [phone]  Physical Therapy Evaluation  Patient Details  Name: Stephanie [name] MRN: [id number] Date of Birth: 1945-09-11 Referring Provider: Merita Norton   Encounter Date: 03/06/2015      PT End of Session - 03/06/15 1258    Visit Number 1   Number of Visits 9   Date for PT Re-Evaluation 04/03/15   Authorization Type 1/10   PT Start Time 1104   PT Stop Time 1205   PT Time Calculation (min) 61 min   Activity Tolerance Patient tolerated treatment well   Behavior During Therapy [facility] for tasks assessed/performed      Past Medical History  Diagnosis Date  . Anxiety   . Bipolar disorder ([place])   . Fibromyalgia   . Hypothyroidism   . Heart failure ([place])   . Sleep apnea   . Fatigue   . Vaginal Pap smear, abnormal   . Heart disease   . HLD (hyperlipidemia)   . Raynaud phenomenon   . Bipolar 1 disorder ([place])   . Depression   . GERD (gastroesophageal reflux disease)   . RA (rheumatoid arthritis) ([place])   . Perianal lesion     high grade sil lesion- keratinizing type  . Pelvic pain in female     Past Surgical History  Procedure Laterality Date  . Tonsillectomy      There were no vitals filed for this visit.  Visit Diagnosis:  Midline low back pain without sciatica - Plan: PT plan of care cert/re-cert      Subjective Assessment - 03/06/15 1114    Subjective pt has history of RA and chroninc hand/neck and wrist pain. she reports no particular injury in particular. she reports she does swim 2 x / week and walks 3 x / week for fitness. pt reports no change in bowel and bladder. pt denies any numbness/ tingling and no significant sciatic- like pain. pt describes the lower back pain  as a dull ache in the middle part of her lower back.    Currently in Pain? Yes   Pain Score 5    Pain Location Back   Pain Orientation Lower   Aggravating  Factors  standing long periods: about 10 minutes. sitting long periods. bending    Pain Relieving Factors heat, biofreeze            [facility] PT Assessment - 03/06/15 1127    Assessment   Medical Diagnosis chronic lower back pain with sciatica   Referring Provider [facility]    Onset Date/Surgical Date 12/05/14   Precautions   Precautions None   Balance Screen   Has the patient fallen in the past 6 months No   Has the patient had a decrease in activity level because of a fear of falling?  No   Is the patient reluctant to leave their home because of a fear of falling?  No   Home Social worker Private residence   Living Arrangements Alone   Prior Function   Level of Independence Independent  without back pain    Cognition   Overall Cognitive Status Within Functional Limits for tasks assessed   Attention Focused   Sensation   Light Touch Appears Intact   Posture/Postural Control   Posture/Postural Control Postural limitations   Postural Limitations Increased thoracic kyphosis  R convex curve of T spine  POSTURE/OBSERVATION: Slight kyphotic posture. R convex thoracic curve. + rib hump with forward flexion on the R  PROM/AROM: lumbars flexion WNL repeated flexion x 5 increased pain slightly Lumbar extension with moderate loss. Reduced ROM by 50% Side-bending WNL bilaterally   STRENGTH:  Graded on a 0-5 scale Muscle Group Left Right  Hip Flex 4 4  Hip Abd 4 4  Hip Add 4 4  Hip Ext 3+ 3+  Hip IR/ER 4 4  Hamstrings  4 4  Knee Ext 4 4  Ankle DF 4 4  Rectus abdominis 2-/5    SENSATION: Dermatomes WNL bilaterally  Myotomes WNL bilaterally  Reflexes 2+ patellar and achilles bilaterally (-) clonus  SPECIAL TESTS: + SLR test for reduced hamstring length bilateraly - figure 4 test + sacral thrust  R PSIS and ASIS is elevated.   FUNCTIONAL MOBILITY: Pt is independent with bed mobility and transfers  BALANCE: good  GAIT: mildly  shuffled  OUTCOME MEASURES: Modified oswestry 48% disability  moderate                                            PT Education - 03/06/15 1257    Education provided Yes   Education Details plan of care, exam findings, heel lift use   Person(s) Educated Patient   Methods Explanation   Comprehension Verbalized understanding             PT Long Term Goals - 03/06/15 1303    PT LONG TERM GOAL #1   Title pt will be able to stand x 20 min without increase LBP to return to church duties    Time 4   Period Weeks   Status New   PT LONG TERM GOAL #2   Title pt will reduce oswestry disability score by 15%    Baseline 48% disability at eval   Time 4   Period Weeks   Status New   PT LONG TERM GOAL #3   Title pt will be able to sit x 1 hr without incresaed LBP   Time 4   Period Weeks   Status New   PT LONG TERM GOAL #4   Title pt will demonstrate proper lifting mechanics to lift 10lb item off the floor with pain <3/10 to the lower back   Time 4   Period Weeks   Status New               Plan - 03/06/15 1259    Clinical Impression Statement pt presents with 3 mo history of back pain with no MOI. pt has history of RA and scoliosis. She is relatively active. upon exam pt is limited with lumbar extension and tender L4-S1. she does have pelvic malalignment likely due to scoliosis with R hip elevated ~1/2 in. pt was issued heel lift today. pt also demonstrates fair hip strength, but poor core strength. pt would benefit from skilled PT services to improve impairments and reduce pain to maximize function,    Pt will benefit from skilled therapeutic intervention in order to improve on the following deficits Decreased range of motion;Hypomobility;Decreased strength;Impaired flexibility;Pain;Improper body mechanics   Rehab Potential Good   Clinical Impairments Affecting Rehab Potential co-morbidities    PT Frequency 2x / week   PT Duration 4 weeks   PT  Treatment/Interventions Aquatic Therapy;Cryotherapy;Electrical Stimulation;Moist Heat;Patient/family education;Neuromuscular re-education;Balance training;Therapeutic exercise;Therapeutic activities;Functional mobility training;Stair  training;Gait training;Manual techniques;Dry needling;Taping   PT Next Visit Plan HEP          G-Codes - 04-01-15 1305    Functional Assessment Tool Used oswestry, clinical judgement   Functional Limitation Changing and maintaining body position   Changing and Maintaining Body Position Current Status [id number]) At least 40 percent but less than 60 percent impaired, limited or restricted   Changing and Maintaining Body Position Goal Status [id number]) At least 1 percent but less than 20 percent impaired, limited or restricted       Problem List Patient Active Problem List   Diagnosis Date Noted  . Sjogren's syndrome ([place]) 02/20/2015  . Combined fat and carbohydrate induced hyperlipemia 10/03/2014  . Breathlessness on exertion 09/24/2014  . Bipolar 1 disorder with moderate mania ([place]) 08/29/2014  . Affective bipolar disorder ([place]) 08/29/2014  . H/O gastrointestinal disease 08/29/2014  . Anxiety, generalized 08/29/2014  . Fibromyalgia 08/29/2014  . H/O elevated lipids 08/29/2014  . H/O: hypothyroidism 08/29/2014  . Insomnia, persistent 08/29/2014  . 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 Chronic pain associated with significant psychosocial dysfunction 08/29/2014  . 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 Rheumatoid arthritis ([place]) 08/29/2014  . Benign essential HTN 01/10/2014  . 3-vessel CAD 01/10/2014  . Polypharmacy 06/29/2013  . Other long term (current) drug therapy 06/29/2013  . Arthritis or polyarthritis, rheumatoid ([place]) 06/22/2013  . Acid reflux 05/05/2012  . Adult hypothyroidism 01/07/2012  . Flu vaccine need 01/07/2012  . Cough 12/14/2011  . CD (contact dermatitis) 10/15/2011  . Finger  wound, simple, open 10/15/2011   Caryl Pina C. Ivin Rosenbloom, PT, DPT [phone]  Nakira Litzau 04-01-15, 1:07 PM  [place] MAIN [facility] SERVICES [address] [place], [place], [address] Phone: [phone]   Fax:  [phone]  Name: Stephanie [name] MRN: [id number] Date of Birth: 23-May-1945

## 2015-03-13 ENCOUNTER — Ambulatory Visit: Payer: Medicare Other | Attending: Rheumatology

## 2015-03-13 DIAGNOSIS — M545 Low back pain, unspecified: Secondary | ICD-10-CM

## 2015-03-13 NOTE — Patient Instructions (Signed)
[email or web address] Mini prone press up x 10 every other hour OR standing lumbar extension at counter top x 10 Lumbar pillow when sitting

## 2015-03-13 NOTE — Therapy (Signed)
[place] MAIN [facility] SERVICES [address] [place], [place], [address] Phone: [phone]   Fax:  [phone]  Physical Therapy Treatment  Patient Details  Name: Stephanie [name] MRN: [id number] Date of Birth: 1946/01/29 Referring Provider: Merita Norton   Encounter Date: 03/13/2015      PT End of Session - 03/13/15 1244    Visit Number 2   Number of Visits 9   Date for PT Re-Evaluation 04/03/15   Authorization Type 2/10   PT Start Time 1104   PT Stop Time 1156   PT Time Calculation (min) 52 min   Activity Tolerance Patient tolerated treatment well   Behavior During Therapy [facility] for tasks assessed/performed  apparent STM loss      Past Medical History  Diagnosis Date  . Anxiety   . Bipolar disorder ([place])   . Fibromyalgia   . Hypothyroidism   . Heart failure ([place])   . Sleep apnea   . Fatigue   . Vaginal Pap smear, abnormal   . Heart disease   . HLD (hyperlipidemia)   . Raynaud phenomenon   . Bipolar 1 disorder ([place])   . Depression   . GERD (gastroesophageal reflux disease)   . RA (rheumatoid arthritis) ([place])   . Perianal lesion     high grade sil lesion- keratinizing type  . Pelvic pain in female     Past Surgical History  Procedure Laterality Date  . Tonsillectomy      There were no vitals filed for this visit.  Visit Diagnosis:  Midline low back pain without sciatica      Subjective Assessment - 03/13/15 1110    Subjective pt reports her back pain is a little worse. she reports she has more pain in her R hip now in addition to her lower back. she identifies that bending particularily to care for her sick dog seems to be aggrevating her back. she also reports sitting is worse than standing today. she denies numbness/tingling and any pain extending past the hip.    How long can you sit comfortably? 20min   How long can you stand comfortably? 48min   Patient Stated Goals reduce pain   Pain Score 7    Pain Location --   back and R hip   Aggravating Factors  sitting long periods, bending, riding in the car      Therex: PT reassessed history.pt reports more pain and peripheral LE pain with sitting and bending ROM: today pt demonstrates severe loss of lumbar flexion prior to treatment with reported increased pain to 7-9/10 in the lower back with flexion ROM post treatment: improved lumbar flexion to mod loss (about 50%) with less pain (5/10) Posture correction with towel roll in place during pt education. Education included principles of peripheralization/centralization and directional preference as well as HEP x 8 min Prone lying with pillow under hips x 2 min, progressed to same position but on elbows 3x79min.  No pillow under hips, prone lying x 2 min. Progressed to prone on elbows 1 min x 3. Then mini prone press up 2x10 Pts symptoms constantly monitored. Prone lying immediately reduced pain from 7 to 6/10. Further reduced to 5/10 with therex with reduced hip pain. Pt able to perform sit to stand and had reduced pain with standing following therex in addition to improved ROM  PT Long Term Goals - 03/06/15 1303    PT LONG TERM GOAL #1   Title pt will be able to stand x 20 min without increase LBP to return to church duties    Time 4   Period Weeks   Status New   PT LONG TERM GOAL #2   Title pt will reduce oswestry disability score by 15%    Baseline 48% disability at eval   Time 4   Period Weeks   Status New   PT LONG TERM GOAL #3   Title pt will be able to sit x 1 hr without incresaed LBP   Time 4   Period Weeks   Status New   PT LONG TERM GOAL #4   Title pt will demonstrate proper lifting mechanics to lift 10lb item off the floor with pain <3/10 to the lower back   Time 4   Period Weeks   Status New               Plan - 03/13/15 1245    Clinical Impression Statement upon exam and treatment today pt demonstrates mechanical  lumbar extension preference. extension based exercises. centralized her pain to her back with resulting 5/10 back pain today with also immediate improvement in lumbar ROM following therex. pt recieved extensive education regarding extesion preference, precautions, activity modificaitons, POC during session as well, but did demonstrate STM loss by repeating her self and questions mutliple times. This may negatively impact her ability to be compliant with HEP.    Pt will benefit from skilled therapeutic intervention in order to improve on the following deficits Decreased range of motion;Hypomobility;Decreased strength;Impaired flexibility;Pain;Improper body mechanics   Rehab Potential Good   Clinical Impairments Affecting Rehab Potential co-morbidities    PT Frequency 2x / week   PT Duration 4 weeks   PT Treatment/Interventions Aquatic Therapy;Cryotherapy;Electrical Stimulation;Moist Heat;Patient/family education;Neuromuscular re-education;Balance training;Therapeutic exercise;Therapeutic activities;Functional mobility training;Stair training;Gait training;Manual techniques;Dry needling;Taping   PT Next Visit Plan reassess ROM, symptoms, progress therex as tolerated        Problem List Patient Active Problem List   Diagnosis Date Noted  . Sjogren's syndrome ([place]) 02/20/2015  . Combined fat and carbohydrate induced hyperlipemia 10/03/2014  . Breathlessness on exertion 09/24/2014  . Bipolar 1 disorder with moderate mania ([place]) 08/29/2014  . Affective bipolar disorder ([place]) 08/29/2014  . H/O gastrointestinal disease 08/29/2014  . Anxiety, generalized 08/29/2014  . Fibromyalgia 08/29/2014  . H/O elevated lipids 08/29/2014  . H/O: hypothyroidism 08/29/2014  . Insomnia, persistent 08/29/2014  . 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 Chronic pain associated with significant psychosocial dysfunction 08/29/2014  . 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 Severe somatoform disorder  08/29/2014  . Rheumatoid arthritis ([place]) 08/29/2014  . Benign essential HTN 01/10/2014  . 3-vessel CAD 01/10/2014  . Polypharmacy 06/29/2013  . Other long term (current) drug therapy 06/29/2013  . Arthritis or polyarthritis, rheumatoid ([place]) 06/22/2013  . Acid reflux 05/05/2012  . Adult hypothyroidism 01/07/2012  . Flu vaccine need 01/07/2012  . Cough 12/14/2011  . CD (contact dermatitis) 10/15/2011  . Finger wound, simple, open 10/15/2011   Caryl Pina C. Wylee Dorantes, PT, DPT [phone]  Caelie Remsburg 03/13/2015, 12:48 PM  [place] MAIN [facility] SERVICES [address] [place], [place], [address] Phone: [phone]   Fax:  [phone]  Name: Stephanie [name] MRN: [id number] Date of Birth: Jul 14, 1945

## 2015-03-18 ENCOUNTER — Ambulatory Visit: Payer: Medicare Other

## 2015-03-20 ENCOUNTER — Ambulatory Visit: Payer: Medicare Other

## 2015-03-20 DIAGNOSIS — M545 Low back pain, unspecified: Secondary | ICD-10-CM

## 2015-03-20 NOTE — Patient Instructions (Signed)
[email or web address]: Diaphragmatic breathing (initiating TA contraction) x 2 min Diaphragmatic breathing (initiating TA contraction) + alt LE march 2x10 Diaphragmatic breathing (initiating TA contraction) + bridge 2x10 scap retractions x 10

## 2015-03-20 NOTE — Therapy (Signed)
[place] MAIN [facility] SERVICES [address] [place], [place], [address] Phone: [phone]   Fax:  [phone]  Physical Therapy Treatment  Patient Details  Name: Stephanie [name] MRN: [id number] Date of Birth: [birth year] Referring Provider: Merita Norton   Encounter Date: 03/20/2015      PT End of Session - 03/20/15 1252    Visit Number 3   Number of Visits 9   Date for PT Re-Evaluation 04/03/15   Authorization Type 3/10   PT Start Time 1030   PT Stop Time 1125   PT Time Calculation (min) 55 min   Activity Tolerance Patient tolerated treatment well   Behavior During Therapy [facility] for tasks assessed/performed  apparent STM loss      Past Medical History  Diagnosis Date  . Anxiety   . Bipolar disorder ([place])   . Fibromyalgia   . Hypothyroidism   . Heart failure ([place])   . Sleep apnea   . Fatigue   . Vaginal Pap smear, abnormal   . Heart disease   . HLD (hyperlipidemia)   . Raynaud phenomenon   . Bipolar 1 disorder ([place])   . Depression   . GERD (gastroesophageal reflux disease)   . RA (rheumatoid arthritis) ([place])   . Perianal lesion     high grade sil lesion- keratinizing type  . Pelvic pain in female     Past Surgical History  Procedure Laterality Date  . Tonsillectomy      There were no vitals filed for this visit.  Visit Diagnosis:  Midline low back pain without sciatica      Subjective Assessment - 03/20/15 1037    Subjective pt reports most of her peripheral symptoms have resolved. she reports her back pain is less now at 5/10.    How long can you sit comfortably? 50min   How long can you stand comfortably? 69min   Patient Stated Goals reduce pain   Currently in Pain? Yes   Pain Score 5    Pain Location Back   Pain Orientation Lower      therex:   Diaphragmatic breathing (initiating TA contraction) x 10 min with pt education  Diaphragmatic breathing (initiating TA contraction) + alt LE march  2x10 Diaphragmatic breathing (initiating TA contraction) + bridge 2x10 scap retractions x 10 with yellow band (low row) 2x10 Pt requires mod to max verbal and tactile cues for proper exercise performance    lumbar extension ROM: mod loss Flexion min loss R/L sidebend WNL  Pt reported reduced pain to 4/10 following treatment                        PT Education - 03/20/15 1252    Education provided Yes   Education Details core activation importance for lumbar support and pain reduction   Person(s) Educated Patient   Methods Explanation   Comprehension Verbalized understanding             PT Long Term Goals - 03/06/15 1303    PT LONG TERM GOAL #1   Title pt will be able to stand x 20 min without increase LBP to return to church duties    Time 4   Period Weeks   Status New   PT LONG TERM GOAL #2   Title pt will reduce oswestry disability score by 15%    Baseline 48% disability at eval   Time 4   Period Weeks   Status New  PT LONG TERM GOAL #3   Title pt will be able to sit x 1 hr without incresaed LBP   Time 4   Period Weeks   Status New   PT LONG TERM GOAL #4   Title pt will demonstrate proper lifting mechanics to lift 10lb item off the floor with pain <3/10 to the lower back   Time 4   Period Weeks   Status New               Plan - 03/20/15 1253    Clinical Impression Statement extensive cues and education needed for pt do perform exercises properly with emphasis on TA contraction with diaphragmatic breathing during exercises. pt has good motivation, but difficutly with body awareness and multitasking, she will need further instruction. pt responded well to mechanical therapy with extension preference with reduced sciatic pain and improved ROM.    Pt will benefit from skilled therapeutic intervention in order to improve on the following deficits Decreased range of motion;Hypomobility;Decreased strength;Impaired flexibility;Pain;Improper  body mechanics   Rehab Potential Good   Clinical Impairments Affecting Rehab Potential co-morbidities    PT Frequency 2x / week   PT Duration 4 weeks   PT Treatment/Interventions Aquatic Therapy;Cryotherapy;Electrical Stimulation;Moist Heat;Patient/family education;Neuromuscular re-education;Balance training;Therapeutic exercise;Therapeutic activities;Functional mobility training;Stair training;Gait training;Manual techniques;Dry needling;Taping   PT Next Visit Plan reassess ROM, symptoms, progress therex as tolerated        Problem List Patient Active Problem List   Diagnosis Date Noted  . Sjogren's syndrome ([place]) 02/20/2015  . Combined fat and carbohydrate induced hyperlipemia 10/03/2014  . Breathlessness on exertion 09/24/2014  . Bipolar 1 disorder with moderate mania ([place]) 08/29/2014  . Affective bipolar disorder ([place]) 08/29/2014  . H/O gastrointestinal disease 08/29/2014  . Anxiety, generalized 08/29/2014  . Fibromyalgia 08/29/2014  . H/O elevated lipids 08/29/2014  . H/O: hypothyroidism 08/29/2014  . Insomnia, persistent 08/29/2014  . 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 Chronic pain associated with significant psychosocial dysfunction 08/29/2014  . 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 Rheumatoid arthritis ([place]) 08/29/2014  . Benign essential HTN 01/10/2014  . 3-vessel CAD 01/10/2014  . Polypharmacy 06/29/2013  . Other long term (current) drug therapy 06/29/2013  . Arthritis or polyarthritis, rheumatoid ([place]) 06/22/2013  . Acid reflux 05/05/2012  . Adult hypothyroidism 01/07/2012  . Flu vaccine need 01/07/2012  . Cough 12/14/2011  . CD (contact dermatitis) 10/15/2011  . Finger wound, simple, open 10/15/2011    Stephanie [name] 03/20/2015, 12:55 PM  [place] MAIN [facility] SERVICES [address] [place], [place], [address] Phone: [phone]   Fax:   [phone]  Name: Stephanie [name] MRN: [id number] Date of Birth: February 13, 1946

## 2015-03-25 ENCOUNTER — Ambulatory Visit: Payer: Medicare Other

## 2015-03-25 DIAGNOSIS — M545 Low back pain, unspecified: Secondary | ICD-10-CM

## 2015-03-26 NOTE — Therapy (Signed)
[place] MAIN [facility] SERVICES [address] [place], [place], [address] Phone: [phone]   Fax:  [phone]  Physical Therapy Treatment  Patient Details  Name: Stephanie [name] MRN: [id number] Date of Birth: Aug 03, 1945 Referring Provider: Merita Norton   Encounter Date: 03/25/2015      PT End of Session - 03/26/15 0849    Visit Number 4   Number of Visits 9   Date for PT Re-Evaluation 04/03/15   Authorization Type 4/10   PT Start Time 1030   PT Stop Time 1115   PT Time Calculation (min) 45 min   Activity Tolerance Patient tolerated treatment well   Behavior During Therapy [facility] for tasks assessed/performed      Past Medical History  Diagnosis Date  . Anxiety   . Bipolar disorder ([place])   . Fibromyalgia   . Hypothyroidism   . Heart failure ([place])   . Sleep apnea   . Fatigue   . Vaginal Pap smear, abnormal   . Heart disease   . HLD (hyperlipidemia)   . Raynaud phenomenon   . Bipolar 1 disorder ([place])   . Depression   . GERD (gastroesophageal reflux disease)   . RA (rheumatoid arthritis) ([place])   . Perianal lesion     high grade sil lesion- keratinizing type  . Pelvic pain in female     Past Surgical History  Procedure Laterality Date  . Tonsillectomy      There were no vitals filed for this visit.  Visit Diagnosis:  Midline low back pain without sciatica      Subjective Assessment - 03/25/15 1049    Subjective Patient reports she continues to have lower back pain with functional activities such as sit to stand, exiting bed, and ascending steps. She reports pain initially with moving around if she has been in one position for too long.    How long can you sit comfortably? 21min   How long can you stand comfortably? 52min   Patient Stated Goals reduce pain   Currently in Pain? Yes   Pain Score 6    Pain Location Back   Pain Orientation Lower   Pain Onset More than a month ago   Pain Frequency Intermittent   Aggravating  Factors  Sitting for too long, standing for too long.    Pain Relieving Factors Moving around, heat, biofreeze.         Supine bridging 2 sets x 10 (cuing for abdominal contraction and pushing through her heels). Felt appropriately in hamstring, hip extensors, not in lumbar paraspinals.   Sit to stands with bodyweight x 10, with 2# DB x 12, with 3# DB x 5 (painful in back) x 7 with 2# DB  Lateral step ups 2 sets x 10 repetitions bilaterally progressing from rail assist to 1 finger.   Educated patient on log rolling to enter/exit bed to decrease abdominal strain.  Step ups - forward x 12, x 10 bilaterally decreasing from rail assist to 1 finger, to HHA with minimal if any pressure applied.                           PT Education - 03/26/15 0856    Education provided Yes   Education Details Log-rolling for supine to sit transfer as well as progression of ther-ex in this session and techniques to improve NM control.    Person(s) Educated Patient   Methods Explanation;Demonstration;Verbal cues  Comprehension Verbalized understanding;Returned demonstration;Verbal cues required             PT Long Term Goals - 03/06/15 1303    PT LONG TERM GOAL #1   Title pt will be able to stand x 20 min without increase LBP to return to church duties    Time 4   Period Weeks   Status New   PT LONG TERM GOAL #2   Title pt will reduce oswestry disability score by 15%    Baseline 48% disability at eval   Time 4   Period Weeks   Status New   PT LONG TERM GOAL #3   Title pt will be able to sit x 1 hr without incresaed LBP   Time 4   Period Weeks   Status New   PT LONG TERM GOAL #4   Title pt will demonstrate proper lifting mechanics to lift 10lb item off the floor with pain <3/10 to the lower back   Time 4   Period Weeks   Status New               Plan - 03/26/15 0849    Clinical Impression Statement Patient appears to have altered NM control with  preferential activation of lumbar paraspinals vs hip extensors for common tasks such as sit to stand and initially with ascending steps. Patient cued for pushing through heels with appropriate trunk positiion and noted decreased symptoms with the above activities. Patient also noted to perform abdominal crunch with supine to sit transfer, educated patient on technique and benefits of logrolling to minimize anterior stress on her lumbar vertebra.    Pt will benefit from skilled therapeutic intervention in order to improve on the following deficits Decreased range of motion;Hypomobility;Decreased strength;Impaired flexibility;Pain;Improper body mechanics   Rehab Potential Good   Clinical Impairments Affecting Rehab Potential co-morbidities    PT Frequency 2x / week   PT Duration 4 weeks   PT Treatment/Interventions Aquatic Therapy;Cryotherapy;Electrical Stimulation;Moist Heat;Patient/family education;Neuromuscular re-education;Balance training;Therapeutic exercise;Therapeutic activities;Functional mobility training;Stair training;Gait training;Manual techniques;Dry needling;Taping   PT Next Visit Plan reassess ROM, symptoms, progress therex as tolerated        Problem List Patient Active Problem List   Diagnosis Date Noted  . Sjogren's syndrome ([place]) 02/20/2015  . Combined fat and carbohydrate induced hyperlipemia 10/03/2014  . Breathlessness on exertion 09/24/2014  . Bipolar 1 disorder with moderate mania ([place]) 08/29/2014  . Affective bipolar disorder ([place]) 08/29/2014  . H/O gastrointestinal disease 08/29/2014  . Anxiety, generalized 08/29/2014  . Fibromyalgia 08/29/2014  . H/O elevated lipids 08/29/2014  . H/O: hypothyroidism 08/29/2014  . Insomnia, persistent 08/29/2014  . 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 Chronic pain associated with significant psychosocial dysfunction 08/29/2014  . 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  Rheumatoid arthritis ([place]) 08/29/2014  . Benign essential HTN 01/10/2014  . 3-vessel CAD 01/10/2014  . Polypharmacy 06/29/2013  . Other long term (current) drug therapy 06/29/2013  . Arthritis or polyarthritis, rheumatoid ([place]) 06/22/2013  . Acid reflux 05/05/2012  . Adult hypothyroidism 01/07/2012  . Flu vaccine need 01/07/2012  . Cough 12/14/2011  . CD (contact dermatitis) 10/15/2011  . Finger wound, simple, open 10/15/2011    Kerman Passey, PT, DPT    03/26/2015, 9:00 AM  [place] MAIN [facility] SERVICES [address] [place], [place], [address] Phone: [phone]   Fax:  [phone]  Name: Stephanie [name] MRN: [id number] Date of Birth:  01/07/1946     

## 2015-03-27 ENCOUNTER — Ambulatory Visit: Payer: Medicare Other

## 2015-04-01 ENCOUNTER — Ambulatory Visit: Payer: Medicare Other

## 2015-04-01 DIAGNOSIS — M545 Low back pain, unspecified: Secondary | ICD-10-CM

## 2015-04-01 NOTE — Therapy (Signed)
[place] MAIN [facility] SERVICES [address] [place], [place], [address] Phone: [phone]   Fax:  [phone]  Physical Therapy Treatment  Patient Details  Name: Stephanie [name] MRN: [id number] Date of Birth: May 26, 1945 Referring Provider: Merita Norton   Encounter Date: 04/01/2015      PT End of Session - 04/01/15 1142    Visit Number 5   Number of Visits 9   Date for PT Re-Evaluation 04/03/15   Authorization Type 5/10   PT Start Time 1030   PT Stop Time 1114   PT Time Calculation (min) 44 min   Activity Tolerance Patient tolerated treatment well   Behavior During Therapy [facility] for tasks assessed/performed      Past Medical History  Diagnosis Date  . Anxiety   . Bipolar disorder ([place])   . Fibromyalgia   . Hypothyroidism   . Heart failure ([place])   . Sleep apnea   . Fatigue   . Vaginal Pap smear, abnormal   . Heart disease   . HLD (hyperlipidemia)   . Raynaud phenomenon   . Bipolar 1 disorder ([place])   . Depression   . GERD (gastroesophageal reflux disease)   . RA (rheumatoid arthritis) ([place])   . Perianal lesion     high grade sil lesion- keratinizing type  . Pelvic pain in female     Past Surgical History  Procedure Laterality Date  . Tonsillectomy      There were no vitals filed for this visit.  Visit Diagnosis:  Midline low back pain without sciatica      Subjective Assessment - 04/01/15 1140    Subjective pt reports she has been stressed out with having to put her dog to sleep which she thinks has increased her LBP to a degree. pt reports she has not had sciatic symptoms however.    How long can you sit comfortably? 82min   How long can you stand comfortably? 54min   Patient Stated Goals reduce pain   Currently in Pain? Yes   Pain Score 4    Pain Location Back   Pain Orientation Lower   Pain Onset More than a month ago      Therex: Diaphragmatic breathing (initiating TA contraction) x 2 min Diaphragmatic  breathing (initiating TA contraction) + bridging 2x10 Diaphragmatic breathing (initiating TA contraction) + alt Le march 2x10 Diaphragmatic breathing (initiating TA contraction) + sidelying clamshell x 10 each leg Supine LTR x10 Standing Diaphragmatic breathing (initiating TA contraction) with hip ext and abduction x 10 each bilaterally Diaphragmatic breathing (initiating TA contraction) with scapular retraction no resistance 2x10 Pt requires mod-max verbal and tactile cues for proper exercise performance   shoulder horiz abduction- initialy with yellow band which was too hard, reduced to no resistance 2 x 10 Pt did not have increased pain with therex.  She does have difficulty with short term recall of exercises and multitasking.                           PT Education - 04/01/15 1141    Education provided Yes   Education Details HEP review. Re-instruction of diaphragmatic breathing to initiate TA activty   Person(s) Educated Patient   Methods Explanation   Comprehension Verbalized understanding;Tactile cues required;Verbal cues required;Need further instruction             PT Long Term Goals - 03/06/15 1303    PT LONG TERM GOAL #1  Title pt will be able to stand x 20 min without increase LBP to return to church duties    Time 4   Period Weeks   Status New   PT LONG TERM GOAL #2   Title pt will reduce oswestry disability score by 15%    Baseline 48% disability at eval   Time 4   Period Weeks   Status New   PT LONG TERM GOAL #3   Title pt will be able to sit x 1 hr without incresaed LBP   Time 4   Period Weeks   Status New   PT LONG TERM GOAL #4   Title pt will demonstrate proper lifting mechanics to lift 10lb item off the floor with pain <3/10 to the lower back   Time 4   Period Weeks   Status New               Plan - 04/01/15 1143    Clinical Impression Statement pt needed re-instruction of HEP to include diaphragmatic breathing with  bridging, alt LE marching and scapular retractions. PT also progressed hip and core strengtheing to reduce LBP and improve strength. pt in general needs mod-verbal and tactile cuing.    Pt will benefit from skilled therapeutic intervention in order to improve on the following deficits Decreased range of motion;Hypomobility;Decreased strength;Impaired flexibility;Pain;Improper body mechanics   Rehab Potential Good   Clinical Impairments Affecting Rehab Potential co-morbidities    PT Frequency 2x / week   PT Duration 4 weeks   PT Treatment/Interventions Aquatic Therapy;Cryotherapy;Electrical Stimulation;Moist Heat;Patient/family education;Neuromuscular re-education;Balance training;Therapeutic exercise;Therapeutic activities;Functional mobility training;Stair training;Gait training;Manual techniques;Dry needling;Taping   PT Next Visit Plan reassess ROM, symptoms, progress therex as tolerated        Problem List Patient Active Problem List   Diagnosis Date Noted  . Sjogren's syndrome ([place]) 02/20/2015  . Combined fat and carbohydrate induced hyperlipemia 10/03/2014  . Breathlessness on exertion 09/24/2014  . Bipolar 1 disorder with moderate mania ([place]) 08/29/2014  . Affective bipolar disorder ([place]) 08/29/2014  . H/O gastrointestinal disease 08/29/2014  . Anxiety, generalized 08/29/2014  . Fibromyalgia 08/29/2014  . H/O elevated lipids 08/29/2014  . H/O: hypothyroidism 08/29/2014  . Insomnia, persistent 08/29/2014  . 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 Chronic pain associated with significant psychosocial dysfunction 08/29/2014  . 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 Rheumatoid arthritis ([place]) 08/29/2014  . Benign essential HTN 01/10/2014  . 3-vessel CAD 01/10/2014  . Polypharmacy 06/29/2013  . Other long term (current) drug therapy 06/29/2013  . Arthritis or polyarthritis, rheumatoid ([place]) 06/22/2013  . Acid reflux  05/05/2012  . Adult hypothyroidism 01/07/2012  . Flu vaccine need 01/07/2012  . Cough 12/14/2011  . CD (contact dermatitis) 10/15/2011  . Finger wound, simple, open 10/15/2011   Caryl Pina C. Ryler Laskowski, PT, DPT [phone]  Eloisa Chokshi 04/01/2015, 11:51 AM  [place] MAIN [facility] SERVICES [address] [place], [place], [address] Phone: [phone]   Fax:  [phone]  Name: Stephanie [name] MRN: [id number] Date of Birth: 1945/05/01

## 2015-04-03 ENCOUNTER — Ambulatory Visit: Payer: Medicare Other

## 2015-04-03 DIAGNOSIS — M545 Low back pain, unspecified: Secondary | ICD-10-CM

## 2015-04-03 NOTE — Therapy (Signed)
[place] MAIN [facility] SERVICES [address] [place], [place], [address] Phone: [phone]   Fax:  [phone]  Physical Therapy Treatment  Patient Details  Name: Stephanie [name] MRN: [id number] Date of Birth: [birth year] Referring Provider: Merita Norton   Encounter Date: 04/03/2015      PT End of Session - 04/03/15 1204    Visit Number 6   Number of Visits 9   Date for PT Re-Evaluation 04/03/15   Authorization Type 6/10   PT Start Time 1101   PT Stop Time 1147   PT Time Calculation (min) 46 min   Activity Tolerance Patient tolerated treatment well   Behavior During Therapy [facility] for tasks assessed/performed      Past Medical History  Diagnosis Date  . Anxiety   . Bipolar disorder ([place])   . Fibromyalgia   . Hypothyroidism   . Heart failure ([place])   . Sleep apnea   . Fatigue   . Vaginal Pap smear, abnormal   . Heart disease   . HLD (hyperlipidemia)   . Raynaud phenomenon   . Bipolar 1 disorder ([place])   . Depression   . GERD (gastroesophageal reflux disease)   . RA (rheumatoid arthritis) ([place])   . Perianal lesion     high grade sil lesion- keratinizing type  . Pelvic pain in female     Past Surgical History  Procedure Laterality Date  . Tonsillectomy      There were no vitals filed for this visit.  Visit Diagnosis:  Midline low back pain without sciatica      Subjective Assessment - 04/03/15 1151    Subjective pt reports she has started attending silver sneakers and is swimming a few times each week. pt reports feeling a little sore from these activities. pt reports doing some of the breathing exercises at home.   How long can you sit comfortably? 70min   How long can you stand comfortably? 25min   Patient Stated Goals reduce pain   Currently in Pain? Yes   Pain Score 4    Pain Location --  LBP   Pain Onset More than a month ago         Therapeutic exercise:  Diaphragmatic breathing in supine with bridges with  yellow theraband at knees 10 x 2 sets pt required mod cues in breathing pattern Prone with pillow under hips and knee bent hip extension with diaphragmatic breathing 10 x 2 sets pt required min cues in breathing pattern and appropriate movement  Sit to stand with yellow theraband at knees and diaphragmatic breathing 10 x 3 sets pt required min cues in breathing pattern and appropriate form Low row with yellow theraband at door 10 x 2 sets pt required mod cues for decreased UT activation, keeping arms straight and chin tucked Standing shoulder abduction with yellow band 5 x 3 sets pt required mod cues for UT activation, arm movement and chin tucked            PT Long Term Goals - 03/06/15 1303    PT LONG TERM GOAL #1   Title pt will be able to stand x 20 min without increase LBP to return to church duties    Time 4   Period Weeks   Status New   PT LONG TERM GOAL #2   Title pt will reduce oswestry disability score by 15%    Baseline 48% disability at eval   Time 4   Period Weeks  Status New   PT LONG TERM GOAL #3   Title pt will be able to sit x 1 hr without incresaed LBP   Time 4   Period Weeks   Status New   PT LONG TERM GOAL #4   Title pt will demonstrate proper lifting mechanics to lift 10lb item off the floor with pain <3/10 to the lower back   Time 4   Period Weeks   Status New               Plan - 04/03/15 1205    Clinical Impression Statement pt is continuing progression of exercises and demonstrates good carry over of correct breathing pattern from previous session. Pt is making a gradual improvement in her lower back pain level as well   Pt will benefit from skilled therapeutic intervention in order to improve on the following deficits Decreased range of motion;Hypomobility;Decreased strength;Impaired flexibility;Pain;Improper body mechanics   Rehab Potential Good   Clinical Impairments Affecting Rehab Potential co-morbidities    PT Frequency 2x / week    PT Duration 4 weeks   PT Treatment/Interventions Aquatic Therapy;Cryotherapy;Electrical Stimulation;Moist Heat;Patient/family education;Neuromuscular re-education;Balance training;Therapeutic exercise;Therapeutic activities;Functional mobility training;Stair training;Gait training;Manual techniques;Dry needling;Taping   PT Next Visit Plan reassess ROM, symptoms, progress therex as tolerated        Problem List Patient Active Problem List   Diagnosis Date Noted  . Sjogren's syndrome ([place]) 02/20/2015  . Combined fat and carbohydrate induced hyperlipemia 10/03/2014  . Breathlessness on exertion 09/24/2014  . Bipolar 1 disorder with moderate mania ([place]) 08/29/2014  . Affective bipolar disorder ([place]) 08/29/2014  . H/O gastrointestinal disease 08/29/2014  . Anxiety, generalized 08/29/2014  . Fibromyalgia 08/29/2014  . H/O elevated lipids 08/29/2014  . H/O: hypothyroidism 08/29/2014  . Insomnia, persistent 08/29/2014  . 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 Chronic pain associated with significant psychosocial dysfunction 08/29/2014  . 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 Rheumatoid arthritis ([place]) 08/29/2014  . Benign essential HTN 01/10/2014  . 3-vessel CAD 01/10/2014  . Polypharmacy 06/29/2013  . Other long term (current) drug therapy 06/29/2013  . Arthritis or polyarthritis, rheumatoid ([place]) 06/22/2013  . Acid reflux 05/05/2012  . Adult hypothyroidism 01/07/2012  . Flu vaccine need 01/07/2012  . Cough 12/14/2011  . CD (contact dermatitis) 10/15/2011  . Finger wound, simple, open 10/15/2011   Domingo Pulse, SPT This entire session was performed under direct supervision and direction of a licensed therapist/therapist assistant . I have personally read, edited and approve of the note as written. Gorden Harms. Tortorici, PT, DPT [phone]  Tortorici,Ashley 04/03/2015, 1:14 PM  [place] MAIN  [facility] SERVICES [address] [place], [place], [address] Phone: [phone]   Fax:  [phone]  Name: Stephanie [name] MRN: [id number] Date of Birth: January 09, 1946

## 2015-04-08 ENCOUNTER — Telehealth: Payer: Self-pay

## 2015-04-08 ENCOUNTER — Other Ambulatory Visit: Payer: Self-pay

## 2015-04-08 ENCOUNTER — Ambulatory Visit: Payer: Medicare Other

## 2015-04-08 DIAGNOSIS — M545 Low back pain, unspecified: Secondary | ICD-10-CM

## 2015-04-08 DIAGNOSIS — G4700 Insomnia, unspecified: Secondary | ICD-10-CM

## 2015-04-08 MED ORDER — TRAZODONE HCL 50 MG PO TABS: ORAL_TABLET | ORAL | Status: DC

## 2015-04-08 MED ORDER — LORAZEPAM 0.5 MG PO TABS: 0.2500 mg | ORAL_TABLET | Freq: Every evening | ORAL | Status: DC | PRN

## 2015-04-08 NOTE — Telephone Encounter (Signed)
Busy

## 2015-04-08 NOTE — Telephone Encounter (Signed)
received a fax about the trazodone.  looks like rx was sent (not printed)(not confirmed) but for trazodone 50mg  take one half a tablet at bedtime as needed for sleep.  was it suppose to be take one and half  1 1/2 tablet at bedtime as needed for sleep. #45?

## 2015-04-08 NOTE — Telephone Encounter (Signed)
rx faxed and confirmed- ativan .5mg  id # [id number] order # [id number]

## 2015-04-08 NOTE — Telephone Encounter (Signed)
pt left a message on voice mail that she was almost out of medication and that she needed refills she will not have enought to make until for her next appt.

## 2015-04-08 NOTE — Therapy (Signed)
[place] MAIN [facility] SERVICES [address] [place], [place], [address] Phone: [phone]   Fax:  [phone]  Physical Therapy Treatment/ Physical Therapy Progress Note 03/05/16 to 04/08/15 / Patient Details  Name: Stephanie [name] MRN: [id number] Date of Birth: Dec 25, 1945 Referring Provider: Merita Norton   Encounter Date: 04/08/2015      PT End of Session - 04/08/15 1325    Visit Number 7   Number of Visits 9   Date for PT Re-Evaluation 04/03/15   Authorization Type 1/10   PT Start Time 1030   PT Stop Time 1115   PT Time Calculation (min) 45 min   Activity Tolerance Patient tolerated treatment well   Behavior During Therapy [facility] for tasks assessed/performed      Past Medical History  Diagnosis Date  . Anxiety   . Bipolar disorder ([place])   . Fibromyalgia   . Hypothyroidism   . Heart failure ([place])   . Sleep apnea   . Fatigue   . Vaginal Pap smear, abnormal   . Heart disease   . HLD (hyperlipidemia)   . Raynaud phenomenon   . Bipolar 1 disorder ([place])   . Depression   . GERD (gastroesophageal reflux disease)   . RA (rheumatoid arthritis) ([place])   . Perianal lesion     high grade sil lesion- keratinizing type  . Pelvic pain in female     Past Surgical History  Procedure Laterality Date  . Tonsillectomy      There were no vitals filed for this visit.  Visit Diagnosis:  Midline low back pain without sciatica - Plan: PT plan of care cert/re-cert      Subjective Assessment - 04/08/15 1324    Subjective pt reports her pain is slowly improving. she is compliant with her HEP   How long can you sit comfortably? 71mn   How long can you stand comfortably? 127m   Patient Stated Goals reduce pain   Currently in Pain? Yes   Pain Score 5    Pain Location Back   Pain Orientation Lower   Pain Onset More than a month ago      ThereX:    Diaphragmatic breathing in supine with Tball  bridges10 x 2 sets pt required min cues in  breathing pattern Standing alternating hip extension with diaphragmatic breathing 10 x 2 sets with yellow band pt required min cues in breathing pattern and appropriate movement  Low row with yellow theraband at door 10 x 2 sets pt required mod cues for decreased UT activation, keeping arms straight and chin tucked Standing shoulder horiz abduction with yellow band 2x10 sets pt required mod cues for UT activation, arm movement and chin tucked Tball LTR x 10 each side Tball alt LE march on Tball 2x10 Pt overall requiring less cues for therex                             PT Long Term Goals - 04/08/15 1327    PT LONG TERM GOAL #1   Title pt will be able to stand x 20 min without increase LBP to return to church duties    Time 4   Period Weeks   Status Partially Met   PT LONG TERM GOAL #2   Title pt will reduce oswestry disability score by 15%    Baseline 48% disability at eval   Time 4   Period Weeks  Status Partially Met   PT LONG TERM GOAL #3   Title pt will be able to sit x 1 hr without incresaed LBP   Time 4   Period Weeks   Status Partially Met   PT LONG TERM GOAL #4   Title pt will demonstrate proper lifting mechanics to lift 10lb item off the floor with pain <3/10 to the lower back   Time 4   Period Weeks   Status Partially Met               Plan - 2015-05-08 1326    Clinical Impression Statement pt is gradually making progress towards goals. she is gradually improving her ability to perform therex needing less cues. her pain is also slowly improving as well and she is no longer having sciatic pain. pt would benefit from conitnued skilled PT services to further address pain, strength to return to PLOF   Pt will benefit from skilled therapeutic intervention in order to improve on the following deficits Decreased range of motion;Hypomobility;Decreased strength;Impaired flexibility;Pain;Improper body mechanics   Rehab Potential Good   Clinical  Impairments Affecting Rehab Potential co-morbidities    PT Frequency 2x / week   PT Duration 4 weeks   PT Treatment/Interventions Aquatic Therapy;Cryotherapy;Electrical Stimulation;Moist Heat;Patient/family education;Neuromuscular re-education;Balance training;Therapeutic exercise;Therapeutic activities;Functional mobility training;Stair training;Gait training;Manual techniques;Dry needling;Taping   PT Next Visit Plan reassess ROM, symptoms, progress therex as tolerated          G-Codes - 08-May-2015 1328    Functional Assessment Tool Used oswestry, clinical judgement   Functional Limitation Changing and maintaining body position   Changing and Maintaining Body Position Current Status (V6720) At least 40 percent but less than 60 percent impaired, limited or restricted   Changing and Maintaining Body Position Goal Status (N4709) At least 20 percent but less than 40 percent impaired, limited or restricted      Problem List Patient Active Problem List   Diagnosis Date Noted  . Sjogren's syndrome ([place]) 02/20/2015  . Combined fat and carbohydrate induced hyperlipemia 10/03/2014  . Breathlessness on exertion 09/24/2014  . Bipolar 1 disorder with moderate mania ([place]) 08/29/2014  . Affective bipolar disorder ([place]) 08/29/2014  . H/O gastrointestinal disease 08/29/2014  . Anxiety, generalized 08/29/2014  . Fibromyalgia 08/29/2014  . H/O elevated lipids 08/29/2014  . H/O: hypothyroidism 08/29/2014  . Insomnia, persistent 08/29/2014  . 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 Chronic pain associated with significant psychosocial dysfunction 08/29/2014  . 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 Rheumatoid arthritis ([place]) 08/29/2014  . Benign essential HTN 01/10/2014  . 3-vessel CAD 01/10/2014  . Polypharmacy 06/29/2013  . Other long term (current) drug therapy 06/29/2013  . Arthritis or polyarthritis, rheumatoid ([place]) 06/22/2013  . Acid  reflux 05/05/2012  . Adult hypothyroidism 01/07/2012  . Flu vaccine need 01/07/2012  . Cough 12/14/2011  . CD (contact dermatitis) 10/15/2011  . Finger wound, simple, open 10/15/2011    Stephanie [name] May 08, 2015, 1:29 PM  [place] MAIN [facility] SERVICES [address] [place], [place], [address] Phone: [phone]   Fax:  [phone]  Name: ELENE DOWNUM MRN: [id number] Date of Birth: 11-04-1945

## 2015-04-08 NOTE — Telephone Encounter (Signed)
received a fax request for refill on both trazodone and on  lorazepam for a 90 day supply.  pt last seen on  02-20-15 next appt  05-22-15

## 2015-04-08 NOTE — Telephone Encounter (Signed)
left message that rx faxed over.

## 2015-04-09 MED ORDER — TRAZODONE HCL 50 MG PO TABS: ORAL_TABLET | ORAL | Status: DC

## 2015-04-10 ENCOUNTER — Ambulatory Visit: Payer: Medicare Other | Attending: Rheumatology

## 2015-04-10 DIAGNOSIS — M545 Low back pain, unspecified: Secondary | ICD-10-CM

## 2015-04-10 NOTE — Therapy (Signed)
[place] MAIN [facility] SERVICES [address] [place], [place], [address] Phone: [phone]   Fax:  [phone]  Physical Therapy Treatment/Physical Therapy Progress Note 03/05/16 to 04/10/15  Patient Details  Name: Stephanie [name] MRN: [id number] Date of Birth: 1945/12/23 Referring Provider: Merita Norton   Encounter Date: 04/10/2015      PT End of Session - 04/10/15 1203    Visit Number 8   Number of Visits 17   Date for PT Re-Evaluation 05/07/15   Authorization Type 1/10   PT Start Time 1101   PT Stop Time 1150   PT Time Calculation (min) 49 min   Activity Tolerance Patient tolerated treatment well   Behavior During Therapy [facility] for tasks assessed/performed      Past Medical History  Diagnosis Date  . Anxiety   . Bipolar disorder ([place])   . Fibromyalgia   . Hypothyroidism   . Heart failure ([place])   . Sleep apnea   . Fatigue   . Vaginal Pap smear, abnormal   . Heart disease   . HLD (hyperlipidemia)   . Raynaud phenomenon   . Bipolar 1 disorder ([place])   . Depression   . GERD (gastroesophageal reflux disease)   . RA (rheumatoid arthritis) ([place])   . Perianal lesion     high grade sil lesion- keratinizing type  . Pelvic pain in female     Past Surgical History  Procedure Laterality Date  . Tonsillectomy      There were no vitals filed for this visit.  Visit Diagnosis:  Midline low back pain without sciatica      Subjective Assessment - 04/10/15 1200    Subjective pt reports feeling some fatigue from silver sneakers and swimming earlier in the day.   How long can you sit comfortably? 38mn   How long can you stand comfortably? 174m   Patient Stated Goals reduce pain   Currently in Pain? Yes   Pain Score 5    Pain Location --  low back   Pain Orientation Lower   Pain Descriptors / Indicators --   Pain Onset More than a month ago      SPT re-assessed outcome measures and progress toward goals as  follows:  POSTURE/OBSERVATION: Pt demonstrates moderate kyphotic posture  PROM/AROM:  AROM of lumbar RSB: 12 deg LSB: 15 deg Flex: 35 deg Ext: 22 deg  STRENGTH:  Graded on a 0-5 scale Muscle Group  Left  Right    Shoulder Flex       Shoulder Abd       Shoulder Ex       Horizontal Abd       Horizontal Add       Elbow Flex      Elbow Ex    Wrist Flex    Rectus Abd 4   Hip Flex  4 4+  Hip Abd  4+ 4+  Hip Add  4+ 4+  Hip Ext  3+ 3+  Hip IR/ER   4-  4-  Knee Flex  4 4+  Knee Ext  4+ 5  Ankle DF  4 4-  Ankle PF         OUTCOME MEASURES:  Oswestry at 28% disability           PT Education - 04/10/15 1203    Education provided Yes   Education Details patient was educated on progress note findings and plan of care   Person(s) Educated Patient  Methods Explanation   Comprehension Verbalized understanding             PT Long Term Goals - 04/10/15 1110    PT LONG TERM GOAL #1   Title pt will be able to stand x 20 min without increase LBP to return to church duties    Time 4   Period Weeks   Status Achieved   PT LONG TERM GOAL #2   Title pt will reduce oswestry disability score by 15%    Baseline 48% disability at eval   Time 4   Period Weeks   Status Partially Met   PT LONG TERM GOAL #3   Title pt will be able to sit x 1 hr without incresaed LBP   Time 4   Period Weeks   Status Achieved   PT LONG TERM GOAL #4   Title pt will demonstrate proper lifting mechanics to lift 10lb item off the floor with pain <3/10 to the lower back   Time 4   Period Weeks   Status Achieved   PT LONG TERM GOAL #5   Title pt will have decreased LBP to <3/10 at rest in order to return to PLOF   Baseline 5/10   Time 4   Period Weeks   Status New   Additional Long Term Goals   Additional Long Term Goals Yes   PT LONG TERM GOAL #6   Title pt will be able to bend to put her socks on with <4/10 low back pain   Time 4   Period Weeks   Status New                Plan - 04/10/15 1205    Clinical Impression Statement progress note was performed as per protocol. pt has demonstrated progress and achievement of all goals with decreased pain in ADLs and improved percieved disability. pt has also demonstrated improved ROM of lumbar flexion, extension and bilateral sidebending with improved strength of core musculature. pt continues to experience some LBP  and limitations in ROM and will benefit from continued threapy   Pt will benefit from skilled therapeutic intervention in order to improve on the following deficits Decreased range of motion;Hypomobility;Decreased strength;Impaired flexibility;Pain;Improper body mechanics   Rehab Potential Good   Clinical Impairments Affecting Rehab Potential co-morbidities    PT Frequency 2x / week   PT Duration 4 weeks   PT Treatment/Interventions Aquatic Therapy;Cryotherapy;Electrical Stimulation;Moist Heat;Patient/family education;Neuromuscular re-education;Balance training;Therapeutic exercise;Therapeutic activities;Functional mobility training;Stair training;Gait training;Manual techniques;Dry needling;Taping   PT Next Visit Plan reassess ROM, symptoms, progress therex as tolerated   Consulted and Agree with Plan of Care Patient          G-Codes - 04/20/15 0839    Functional Assessment Tool Used oswestry, clinical judgement   Functional Limitation Changing and maintaining body position   Changing and Maintaining Body Position Current Status [phone]) At least 20 percent but less than 40 percent impaired, limited or restricted   Changing and Maintaining Body Position Goal Status (U5427) At least 1 percent but less than 20 percent impaired, limited or restricted      Problem List Patient Active Problem List   Diagnosis Date Noted  . Sjogren's syndrome ([place]) 02/20/2015  . Combined fat and carbohydrate induced hyperlipemia 10/03/2014  . Breathlessness on exertion 09/24/2014  . Bipolar 1 disorder with moderate mania  ([place]) 08/29/2014  . Affective bipolar disorder ([place]) 08/29/2014  . H/O gastrointestinal disease 08/29/2014  . Anxiety, generalized 08/29/2014  .  Fibromyalgia 08/29/2014  . H/O elevated lipids 08/29/2014  . H/O: hypothyroidism 08/29/2014  . Insomnia, persistent 08/29/2014  . 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 Chronic pain associated with significant psychosocial dysfunction 08/29/2014  . 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 Rheumatoid arthritis ([place]) 08/29/2014  . Benign essential HTN 01/10/2014  . 3-vessel CAD 01/10/2014  . Polypharmacy 06/29/2013  . Other long term (current) drug therapy 06/29/2013  . Arthritis or polyarthritis, rheumatoid ([place]) 06/22/2013  . Acid reflux 05/05/2012  . Adult hypothyroidism 01/07/2012  . Flu vaccine need 01/07/2012  . Cough 12/14/2011  . CD (contact dermatitis) 10/15/2011  . Finger wound, simple, open 10/15/2011   Domingo Pulse, SPT This entire session was performed under direct supervision and direction of a licensed therapist/therapist assistant . I have personally read, edited and approve of the note as written. Gorden Harms. Tortorici, PT, DPT [phone]  Tortorici,Ashley 04/11/2015, 8:40 AM  [place] MAIN [facility] SERVICES [address] [place], [place], [address] Phone: [phone]   Fax:  [phone]  Name: Stephanie [name] MRN: [id number] Date of Birth: Sep 29, 1945

## 2015-04-11 ENCOUNTER — Telehealth: Payer: Self-pay

## 2015-04-11 NOTE — Telephone Encounter (Signed)
Spoke with patient today. She indicated that her dog of 8 years died on April 10, 2022. Patient did inquire as to whether instead of taking lorazepam 0.25 mg at bedtime she could on occasion take 0.5 mg at bedtime. He indicates she does have support through her church and from family. AW

## 2015-04-11 NOTE — Telephone Encounter (Signed)
pt states that she got her rx for the lorazepam, but she was wondering if it was ok to take a whole pill instead of takin a half of a pill.  pt states that she had to put her dog to sleep and she is having a hard time with the loss of her dog.

## 2015-04-17 ENCOUNTER — Ambulatory Visit: Payer: Medicare Other

## 2015-04-17 DIAGNOSIS — M545 Low back pain, unspecified: Secondary | ICD-10-CM

## 2015-04-17 NOTE — Patient Instructions (Addendum)
[email or web address] SKTC 2x10 LTR 2x10  Diaphragmatic breathing in supine with bridges10 x 2 sets pt required min cues in breathing pattern Resisted hip ER with yellow band in hooklying with Diaphragmatic breathing (initiating TA contraction) 2x10 Standing alternating hip extension with diaphragmatic breathing 10 x 2 sets with yellow band pt required min cues in breathing pattern and appropriate movement  Low row with yellow theraband at door 10 x 2 sets pt required mod cues for decreased UT activation, keeping arms straight and chin tucked Standing shoulder horiz abduction with yellow band 2x10 sets pt required mod cues for UT activation, arm movement and chin tucked LTR x 10 each side Tball alt LE march on Tball 2x10 Pt overall requiring less cues for therex

## 2015-04-17 NOTE — Therapy (Signed)
[place] MAIN [facility] SERVICES [address] [place], [place], [address] Phone: [phone]   Fax:  [phone]  Physical Therapy Treatment  Patient Details  Name: Stephanie [name] MRN: [id number] Date of Birth: October 16, 1945 Referring Provider: Merita Norton   Encounter Date: 04/17/2015      PT End of Session - 04/17/15 1032    Visit Number 9   Number of Visits 17   Date for PT Re-Evaluation 05/07/15   Authorization Type 2/10   PT Start Time 0932   PT Stop Time 1020   PT Time Calculation (min) 48 min   Activity Tolerance Patient tolerated treatment well   Behavior During Therapy [facility] for tasks assessed/performed      Past Medical History  Diagnosis Date  . Anxiety   . Bipolar disorder ([place])   . Fibromyalgia   . Hypothyroidism   . Heart failure ([place])   . Sleep apnea   . Fatigue   . Vaginal Pap smear, abnormal   . Heart disease   . HLD (hyperlipidemia)   . Raynaud phenomenon   . Bipolar 1 disorder ([place])   . Depression   . GERD (gastroesophageal reflux disease)   . RA (rheumatoid arthritis) ([place])   . Perianal lesion     high grade sil lesion- keratinizing type  . Pelvic pain in female     Past Surgical History  Procedure Laterality Date  . Tonsillectomy      There were no vitals filed for this visit.  Visit Diagnosis:  Midline low back pain without sciatica      Subjective Assessment - 04/17/15 0932    Subjective pt reports she had a bad day with her back over the weekend but its better now.    How long can you sit comfortably? 61mn   How long can you stand comfortably? 177m   Patient Stated Goals reduce pain   Pain Score 4    Pain Location --  lower back   Pain Onset More than a month ago     Therex:   SKTC 2x10 LTR 2x10  Diaphragmatic breathing in supine with bridges10 x 2 sets pt required min cues in breathing pattern Resisted hip ER with yellow band in hooklying with Diaphragmatic breathing (initiating TA  contraction) 2x10 Standing alternating hip extension with diaphragmatic breathing 10 x 2 sets with yellow band pt required min cues in breathing pattern and appropriate movement  Standing hip abduction with yellow band 2x10 Low row with yellow theraband at door 10 x 2 sets pt required mod cues for decreased UT activation, keeping arms straight and chin tucked LTR x 10 each side Pt requires min-mod cues for proper exercise performance, however has poor recall of the exercises she performed today after finishing                                PT Long Term Goals - 04/10/15 1110    PT LONG TERM GOAL #1   Title pt will be able to stand x 20 min without increase LBP to return to church duties    Time 4   Period Weeks   Status Achieved   PT LONG TERM GOAL #2   Title pt will reduce oswestry disability score by 15%    Baseline 48% disability at eval   Time 4   Period Weeks   Status Partially Met   PT LONG TERM  GOAL #3   Title pt will be able to sit x 1 hr without incresaed LBP   Time 4   Period Weeks   Status Achieved   PT LONG TERM GOAL #4   Title pt will demonstrate proper lifting mechanics to lift 10lb item off the floor with pain <3/10 to the lower back   Time 4   Period Weeks   Status Achieved   PT LONG TERM GOAL #5   Title pt will have decreased LBP to <3/10 at rest in order to return to PLOF   Baseline 5/10   Time 4   Period Weeks   Status New   Additional Long Term Goals   Additional Long Term Goals Yes   PT LONG TERM GOAL #6   Title pt will be able to bend to put her socks on with <4/10 low back pain   Time 4   Period Weeks   Status New               Plan - 04/17/15 1032    Clinical Impression Statement progressed HEP today with extensive instruction, visual demonstration, verbal and tactile cuing needed. pt was issued adv. HEP with T bands with instruction to return next visit with any questions or concerns. HEP will likely need review-  re-instruction next visit. pt will be DC once indepenent proper HEP is achieved. pt has difficulty with this due to short term memory loss. pt has overall reduced pain and no longer has sciatic pain. re-introduced flexion into the lumbar spine for stretching today without increased pain.    Pt will benefit from skilled therapeutic intervention in order to improve on the following deficits Decreased range of motion;Hypomobility;Decreased strength;Impaired flexibility;Pain;Improper body mechanics   Rehab Potential Good   Clinical Impairments Affecting Rehab Potential co-morbidities    PT Frequency 2x / week   PT Duration 4 weeks   PT Treatment/Interventions Aquatic Therapy;Cryotherapy;Electrical Stimulation;Moist Heat;Patient/family education;Neuromuscular re-education;Balance training;Therapeutic exercise;Therapeutic activities;Functional mobility training;Stair training;Gait training;Manual techniques;Dry needling;Taping   PT Next Visit Plan review HEP   Consulted and Agree with Plan of Care Patient        Problem List Patient Active Problem List   Diagnosis Date Noted  . Sjogren's syndrome ([place]) 02/20/2015  . Combined fat and carbohydrate induced hyperlipemia 10/03/2014  . Breathlessness on exertion 09/24/2014  . Bipolar 1 disorder with moderate mania ([place]) 08/29/2014  . Affective bipolar disorder ([place]) 08/29/2014  . H/O gastrointestinal disease 08/29/2014  . Anxiety, generalized 08/29/2014  . Fibromyalgia 08/29/2014  . H/O elevated lipids 08/29/2014  . H/O: hypothyroidism 08/29/2014  . Insomnia, persistent 08/29/2014  . 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 Chronic pain associated with significant psychosocial dysfunction 08/29/2014  . 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 Rheumatoid arthritis ([place]) 08/29/2014  . Benign essential HTN 01/10/2014  . 3-vessel CAD 01/10/2014  . Polypharmacy 06/29/2013  . Other long term  (current) drug therapy 06/29/2013  . Arthritis or polyarthritis, rheumatoid ([place]) 06/22/2013  . Acid reflux 05/05/2012  . Adult hypothyroidism 01/07/2012  . Flu vaccine need 01/07/2012  . Cough 12/14/2011  . CD (contact dermatitis) 10/15/2011  . Finger wound, simple, open 10/15/2011   Caryl Pina C. Deserea Bordley, PT, DPT [phone]  Raahim Shartzer 04/17/2015, 10:37 AM  [place] MAIN [facility] SERVICES [address] [place], [place], [address] Phone: [phone]   Fax:  [phone]  Name: Stephanie [name] MRN: [id number] Date of Birth: 03/24/1945

## 2015-04-23 ENCOUNTER — Ambulatory Visit: Payer: Medicare Other

## 2015-04-23 DIAGNOSIS — M545 Low back pain, unspecified: Secondary | ICD-10-CM

## 2015-04-23 NOTE — Therapy (Signed)
[place] MAIN [facility] SERVICES [address] [place], [place], [address] Phone: [phone]   Fax:  [phone]  Physical Therapy Treatment  Patient Details  Name: Stephanie [name] MRN: [id number] Date of Birth: 09-01-1945 Referring Provider: Merita Norton   Encounter Date: 04/23/2015      PT End of Session - 04/23/15 1313    Visit Number 10   Number of Visits 17   Date for PT Re-Evaluation 05/07/15   Authorization Type 3/10   PT Start Time 0945   PT Stop Time 1020   PT Time Calculation (min) 35 min   Activity Tolerance Patient tolerated treatment well   Behavior During Therapy [facility] for tasks assessed/performed      Past Medical History  Diagnosis Date  . Anxiety   . Bipolar disorder ([place])   . Fibromyalgia   . Hypothyroidism   . Heart failure ([place])   . Sleep apnea   . Fatigue   . Vaginal Pap smear, abnormal   . Heart disease   . HLD (hyperlipidemia)   . Raynaud phenomenon   . Bipolar 1 disorder ([place])   . Depression   . GERD (gastroesophageal reflux disease)   . RA (rheumatoid arthritis) ([place])   . Perianal lesion     high grade sil lesion- keratinizing type  . Pelvic pain in female     Past Surgical History  Procedure Laterality Date  . Tonsillectomy      There were no vitals filed for this visit.  Visit Diagnosis:  Midline low back pain without sciatica      Subjective Assessment - 04/23/15 1312    Subjective pt reports she has been compliant with her new HEP, and "it has really helped!" she reports she has very minimal LBP and understands her exercises well   How long can you sit comfortably? 53min   How long can you stand comfortably? 59min   Patient Stated Goals reduce pain   Currently in Pain? Yes   Pain Score 1    Pain Location --  lower back   Pain Onset More than a month ago      Therex:   SKTC 2x10 LTR 2x10 Diaphragmatic breathing in supine with bridges10 x 2 sets pt required min cues in breathing  pattern Resisted hip ER with yellow band in hooklying with Diaphragmatic breathing (initiating TA contraction) 2x10 Standing alternating hip extension with diaphragmatic breathing 10 x 2 sets with yellow band Standing hip abduction with yellow band 2x10 Low row with yellow theraband at door 10 x 2 sets pt required very little LTR x 10 each side pt required very little cuing with exercise performance today. Did req. Re- instruction of resisted hip ER.                         PT Education - 04/23/15 1313    Education provided Yes   Education Details progression of therex regarding sets and reps in the future   Person(s) Educated Patient   Methods Explanation   Comprehension Verbalized understanding             PT Long Term Goals - 04/23/15 1316    PT LONG TERM GOAL #1   Title pt will be able to stand x 20 min without increase LBP to return to church duties    Time 4   Period Weeks   Status Achieved   PT LONG TERM GOAL #2  Title pt will reduce oswestry disability score by 15%    Baseline 20% 2022-05-17   Time 4   Period Weeks   Status Achieved   PT LONG TERM GOAL #3   Title pt will be able to sit x 1 hr without incresaed LBP   Time 4   Period Weeks   Status Achieved   PT LONG TERM GOAL #4   Title pt will demonstrate proper lifting mechanics to lift 10lb item off the floor with pain <3/10 to the lower back   Time 4   Period Weeks   Status Achieved   PT LONG TERM GOAL #5   Title pt will have decreased LBP to <3/10 at rest in order to return to PLOF   Baseline 5/10   Time 4   Period Weeks   Status New   PT LONG TERM GOAL #6   Title pt will be able to bend to put her socks on with <4/10 low back pain   Time 4   Period Weeks   Status New               Plan - 18-May-2015 1313    Clinical Impression Statement pt has achieved PT goals at this time and is independent with HEP. pt has had great reduction in pain symptoms since beginning PT. pt will be  DC to independent home program at this time as skilled PT is no longer needed.    Pt will benefit from skilled therapeutic intervention in order to improve on the following deficits Decreased range of motion;Hypomobility;Decreased strength;Impaired flexibility;Pain;Improper body mechanics   Rehab Potential Good   Clinical Impairments Affecting Rehab Potential co-morbidities    PT Frequency 2x / week   PT Duration 4 weeks   PT Treatment/Interventions Aquatic Therapy;Cryotherapy;Electrical Stimulation;Moist Heat;Patient/family education;Neuromuscular re-education;Balance training;Therapeutic exercise;Therapeutic activities;Functional mobility training;Stair training;Gait training;Manual techniques;Dry needling;Taping   PT Next Visit Plan review HEP   Consulted and Agree with Plan of Care Patient          G-Codes - May 18, 2015 1316    Functional Assessment Tool Used oswestry, clinical judgement   Functional Limitation Changing and maintaining body position   Changing and Maintaining Body Position Current Status [id number]) At least 1 percent but less than 20 percent impaired, limited or restricted   Changing and Maintaining Body Position Goal Status [id number]) At least 1 percent but less than 20 percent impaired, limited or restricted   Changing and Maintaining Body Position Discharge Status [id number]) At least 1 percent but less than 20 percent impaired, limited or restricted      Problem List Patient Active Problem List   Diagnosis Date Noted  . Sjogren's syndrome ([place]) 02/20/2015  . Combined fat and carbohydrate induced hyperlipemia 10/03/2014  . Breathlessness on exertion 09/24/2014  . Bipolar 1 disorder with moderate mania ([place]) 08/29/2014  . Affective bipolar disorder ([place]) 08/29/2014  . H/O gastrointestinal disease 08/29/2014  . Anxiety, generalized 08/29/2014  . Fibromyalgia 08/29/2014  . H/O elevated lipids 08/29/2014  . H/O: hypothyroidism 08/29/2014  . Insomnia, persistent 08/29/2014   . 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 Chronic pain associated with significant psychosocial dysfunction 08/29/2014  . 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 Rheumatoid arthritis ([place]) 08/29/2014  . Benign essential HTN 01/10/2014  . 3-vessel CAD 01/10/2014  . Polypharmacy 06/29/2013  . Other long term (current) drug therapy 06/29/2013  . Arthritis or polyarthritis, rheumatoid ([place]) 06/22/2013  . Acid reflux 05/05/2012  . Adult  hypothyroidism 01/07/2012  . Flu vaccine need 01/07/2012  . Cough 12/14/2011  . CD (contact dermatitis) 10/15/2011  . Finger wound, simple, open 10/15/2011   Stephanie [name], PT, DPT [phone]  Stephanie [name] 04/23/2015, 1:19 PM  [place] MAIN [facility] SERVICES [address] [place], [place], [address] Phone: [phone]   Fax:  [phone]  Name: Stephanie [name] MRN: [id number] Date of Birth: [birth year]

## 2015-04-25 ENCOUNTER — Ambulatory Visit: Payer: Medicare Other

## 2015-05-16 ENCOUNTER — Emergency Department: Payer: Medicare Other

## 2015-05-16 ENCOUNTER — Emergency Department
Admission: EM | Admit: 2015-05-16 | Discharge: 2015-05-17 | Disposition: A | Discharge status: Home / Self Care | Payer: Medicare Other | Attending: Emergency Medicine | Admitting: Emergency Medicine

## 2015-05-16 ENCOUNTER — Encounter: Payer: Self-pay | Admitting: Emergency Medicine

## 2015-05-16 DIAGNOSIS — Z7982 Long term (current) use of aspirin: Secondary | ICD-10-CM | POA: Diagnosis not present

## 2015-05-16 DIAGNOSIS — Z880 Allergy status to penicillin: Secondary | ICD-10-CM | POA: Diagnosis not present

## 2015-05-16 DIAGNOSIS — Z79899 Other long term (current) drug therapy: Secondary | ICD-10-CM | POA: Insufficient documentation

## 2015-05-16 DIAGNOSIS — R079 Chest pain, unspecified: Secondary | ICD-10-CM | POA: Insufficient documentation

## 2015-05-16 DIAGNOSIS — I10 Essential (primary) hypertension: Secondary | ICD-10-CM | POA: Insufficient documentation

## 2015-05-16 DIAGNOSIS — I509 Heart failure, unspecified: Secondary | ICD-10-CM | POA: Diagnosis not present

## 2015-05-16 LAB — BASIC METABOLIC PANEL
ANION GAP: 6 (ref 5–15)
BUN: 16 mg/dL (ref 6–20)
CHLORIDE: 103 mmol/L (ref 101–111)
CO2: 30 mmol/L (ref 22–32)
Calcium: 9 mg/dL (ref 8.9–10.3)
Creatinine, Ser: 0.53 mg/dL (ref 0.44–1.00)
GFR calc Af Amer: >60 mL/min (ref >60)
GLUCOSE: 104 mg/dL — AB (ref 65–99)
POTASSIUM: 3.6 mmol/L (ref 3.5–5.1)
Sodium: 139 mmol/L (ref 135–145)

## 2015-05-16 LAB — CBC
HEMATOCRIT: 41.3 % (ref 35.0–47.0)
HEMOGLOBIN: 13.9 g/dL (ref 12.0–16.0)
MCH: 34.2 pg — ABNORMAL HIGH (ref 26.0–34.0)
MCHC: 33.7 g/dL (ref 32.0–36.0)
MCV: 101.5 fL — AB (ref 80.0–100.0)
Platelets: 138 10*3/uL — ABNORMAL LOW (ref 150–440)
RBC: 4.07 MIL/uL (ref 3.80–5.20)
RDW: 14.1 % (ref 11.5–14.5)
WBC: 6.2 10*3/uL (ref 3.6–11.0)

## 2015-05-16 LAB — TROPONIN I: Troponin I: <0.03 ng/mL (ref <0.031)

## 2015-05-16 MED ORDER — GI COCKTAIL ~~LOC~~
30.0000 mL | Freq: Once | ORAL | Status: AC
  Administered 2015-05-16: 30 mL via ORAL
  Filled 2015-05-16: qty 30

## 2015-05-16 NOTE — ED Provider Notes (Signed)
[facility] Emergency Department Provider Note    ____________________________________________  Time seen: ~2205  I have reviewed the triage vital signs and the nursing notes.   HISTORY  Chief Complaint Chest Pain   History limited by: Not Limited   HPI Stephanie [name] is a 70 y.o. female who presents to the emergency department today because of concerns for chest pain. She describes as being located in the center chest. She describes it as sharp. She states that it did improve slightly with nitroglycerin however it did come back. She denied any significant associated shortness of breath. No diaphoresis. Patient states that she has had this pain in the past and was told she had angina. She denies any history of heart attacks. Denies any recent illnesses, fevers, nausea vomiting or diarrhea.    Past Medical History  Diagnosis Date  . Anxiety   . Bipolar disorder ([place])   . Fibromyalgia   . Hypothyroidism   . Heart failure ([place])   . Sleep apnea   . Fatigue   . Vaginal Pap smear, abnormal   . Heart disease   . HLD (hyperlipidemia)   . Raynaud phenomenon   . Bipolar 1 disorder ([place])   . Depression   . GERD (gastroesophageal reflux disease)   . RA (rheumatoid arthritis) ([place])   . Perianal lesion     high grade sil lesion- keratinizing type  . Pelvic pain in female     Patient Active Problem List   Diagnosis Date Noted  . Sjogren's syndrome ([place]) 02/20/2015  . Combined fat and carbohydrate induced hyperlipemia 10/03/2014  . Breathlessness on exertion 09/24/2014  . Bipolar 1 disorder with moderate mania ([place]) 08/29/2014  . Affective bipolar disorder ([place]) 08/29/2014  . H/O gastrointestinal disease 08/29/2014  . Anxiety, generalized 08/29/2014  . Fibromyalgia 08/29/2014  . H/O elevated lipids 08/29/2014  . H/O: hypothyroidism 08/29/2014  . Insomnia, persistent 08/29/2014  . 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 Chronic pain associated  with significant psychosocial dysfunction 08/29/2014  . 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 Rheumatoid arthritis ([place]) 08/29/2014  . Benign essential HTN 01/10/2014  . 3-vessel CAD 01/10/2014  . Polypharmacy 06/29/2013  . Other long term (current) drug therapy 06/29/2013  . Arthritis or polyarthritis, rheumatoid ([place]) 06/22/2013  . Acid reflux 05/05/2012  . Adult hypothyroidism 01/07/2012  . Flu vaccine need 01/07/2012  . Cough 12/14/2011  . CD (contact dermatitis) 10/15/2011  . Finger wound, simple, open 10/15/2011    Past Surgical History  Procedure Laterality Date  . Tonsillectomy      Current Outpatient Rx  Name  Route  Sig  Dispense  Refill  . abatacept (ORENCIA) 250 MG injection   Intravenous   Inject into the vein.         Marland Kitchen aspirin 81 MG tablet   Oral   Take by mouth.         . Calcium Citrate-Vitamin D ([employer] CALCIUM CITRATE +D3 MINI) 200-250 MG-UNIT TABS   Oral   Take by mouth.         . carbamazepine (TEGRETOL) 100 MG chewable tablet   Oral   Chew 6 tablets (600 mg total) by mouth at bedtime.   540 tablet   1   . divalproex (DEPAKOTE ER) 500 MG 24 hr tablet   Oral   Take 1 tablet (500 mg total) by mouth 2 (two) times daily.   180 tablet   1   .  folic acid (FOLVITE) 1 MG tablet               . gabapentin (NEURONTIN) 300 MG capsule   Oral   Take 1 capsule (300 mg total) by mouth 2 (two) times daily.   180 capsule   1   . levothyroxine (SYNTHROID) 75 MCG tablet   Oral   Take by mouth.         Marland Kitchen LORazepam (ATIVAN) 0.5 MG tablet   Oral   Take 0.5 tablets (0.25 mg total) by mouth at bedtime as needed for anxiety.   45 tablet   1   . methotrexate 2.5 MG tablet   Oral   Take by mouth.         . MULTIPLE VITAMIN PO   Oral   Take by mouth.         . nitroGLYCERIN (NITROSTAT) 0.4 MG SL tablet   Sublingual   Place under the tongue.         . ondansetron (ZOFRAN) 8 MG  tablet   Oral   Take by mouth.         . QUEtiapine (SEROQUEL) 25 MG tablet   Oral   Take 1 tablet (25 mg total) by mouth at bedtime.   90 tablet   1   . ranitidine (ZANTAC) 150 MG tablet   Oral   Take by mouth.         . traZODone (DESYREL) 50 MG tablet      Take one half a tablet at bedtime as needed for sleep.   45 tablet   1     Allergies Amoxicillin-pot clavulanate; Erythromycin; Azithromycin; Codeine; Lamotrigine; Macrolides and ketolides; Oxycodone-acetaminophen; Penicillins; Sulfa antibiotics; and Tramadol  Family History  Problem Relation Age of Onset  . Heart failure Father   . Parkinson's disease Mother   . Bipolar disorder Mother   . Depression Sister   . Schizophrenia Paternal Grandmother   . Diabetes Maternal Grandmother   . Pancreatic cancer Maternal Grandmother   . Cancer Neg Hx   . Heart disease Neg Hx     Social History Social History  Substance Use Topics  . Smoking status: Never Smoker   . Smokeless tobacco: Former Systems developer  . Alcohol Use: No    Review of Systems  Constitutional: Negative for fever. Cardiovascular: Positive for chest pain. Respiratory: Negative for shortness of breath. Gastrointestinal: Negative for abdominal pain, vomiting and diarrhea. Neurological: Negative for headaches, focal weakness or numbness.  10-point ROS otherwise negative.  ____________________________________________   PHYSICAL EXAM:  VITAL SIGNS: ED Triage Vitals  Enc Vitals Group     BP 05/16/15 2105 117/75 mmHg     Pulse Rate 05/16/15 2105 66     Resp 05/16/15 2105 10     Temp 05/16/15 2105 97.8 F (36.6 C)     Temp src --      SpO2 05/16/15 2105 99 %     Weight 05/16/15 2105 107 lb (48.535 kg)     Height 05/16/15 2105 5' (1.524 m)     Head Cir --      Peak Flow --      Pain Score 05/16/15 2108 4   Constitutional: Alert and oriented. Well appearing and in no distress. Eyes: Conjunctivae are normal. PERRL. Normal extraocular  movements. ENT   Head: Normocephalic and atraumatic.   Nose: No congestion/rhinnorhea.   Mouth/Throat: Mucous membranes are moist.   Neck: No stridor. Hematological/Lymphatic/Immunilogical: No cervical lymphadenopathy. Cardiovascular: Normal  rate, regular rhythm.  No murmurs, rubs, or gallops. Respiratory: Normal respiratory effort without tachypnea nor retractions. Breath sounds are clear and equal bilaterally. No wheezes/rales/rhonchi. Gastrointestinal: Soft and nontender. No distention. There is no CVA tenderness. Genitourinary: Deferred Musculoskeletal: Normal range of motion in all extremities. No joint effusions.  No lower extremity tenderness nor edema. Neurologic:  Normal speech and language. No gross focal neurologic deficits are appreciated.  Skin:  Skin is warm, dry and intact. No rash noted. Psychiatric: Mood and affect are normal. Speech and behavior are normal. Patient exhibits appropriate insight and judgment.  ____________________________________________    LABS (pertinent positives/negatives)  Labs Reviewed  BASIC METABOLIC PANEL - Abnormal; Notable for the following:    Glucose, Bld 104 (*)    All other components within normal limits  CBC - Abnormal; Notable for the following:    MCV 101.5 (*)    MCH 34.2 (*)    Platelets 138 (*)    All other components within normal limits  TROPONIN I  TROPONIN I    ____________________________________________   EKG  I, Nance Pear, attending physician, personally viewed and interpreted this EKG  EKG Time: 2111 Rate: 64 Rhythm: normal sinus rhythm Axis: normal Intervals: qtc 420 QRS: narrow, q waves V1 ST changes: no st elevation Impression: abnormal ekg   ____________________________________________    RADIOLOGY  CXR IMPRESSION: Hyperinflation and bronchitic changes.  ____________________________________________   PROCEDURES  Procedure(s) performed: None  Critical Care performed:  No  ____________________________________________   INITIAL IMPRESSION / ASSESSMENT AND PLAN / ED COURSE  Pertinent labs & imaging results that were available during my care of the patient were reviewed by me and considered in my medical decision making (see chart for details).  Patient presents to the emergency department today because of concerns for central sharp chest pain. On exam patient appears well. EKG without any acute findings. Initial troponin was negative. Patient does not have a history of heart attack in the past. The patient is suitable for a second troponin. If negative at the patient is safe for discharge to follow up with established cardiologist.  ____________________________________________   FINAL CLINICAL IMPRESSION(S) / ED DIAGNOSES  Final diagnoses:  Chest pain, unspecified chest pain type     Nance Pear, MD 05/16/15 2351

## 2015-05-16 NOTE — Discharge Instructions (Signed)
Please seek medical attention for any high fevers, chest pain, shortness of breath, change in behavior, persistent vomiting, bloody stool or any other new or concerning symptoms. ° ° °Nonspecific Chest Pain °It is often hard to find the cause of chest pain. There is always a chance that your pain could be related to something serious, such as a heart attack or a blood clot in your lungs. Chest pain can also be caused by conditions that are not life-threatening. If you have chest pain, it is very important to follow up with your doctor. ° °HOME CARE °· If you were prescribed an antibiotic medicine, finish it all even if you start to feel better. °· Avoid any activities that cause chest pain. °· Do not use any tobacco products, including cigarettes, chewing tobacco, or electronic cigarettes. If you need help quitting, ask your doctor. °· Do not drink alcohol. °· Take medicines only as told by your doctor. °· Keep all follow-up visits as told by your doctor. This is important. This includes any further testing if your chest pain does not go away. °· Your doctor may tell you to keep your head raised (elevated) while you sleep. °· Make lifestyle changes as told by your doctor. These may include: °¨ Getting regular exercise. Ask your doctor to suggest some activities that are safe for you. °¨ Eating a heart-healthy diet. Your doctor or a diet specialist (dietitian) can help you to learn healthy eating options. °¨ Maintaining a healthy weight. °¨ Managing diabetes, if necessary. °¨ Reducing stress. °GET HELP IF: °· Your chest pain does not go away, even after treatment. °· You have a rash with blisters on your chest. °· You have a fever. °GET HELP RIGHT AWAY IF: °· Your chest pain is worse. °· You have an increasing cough, or you cough up blood. °· You have severe belly (abdominal) pain. °· You feel extremely weak. °· You pass out (faint). °· You have chills. °· You have sudden, unexplained chest discomfort. °· You have  sudden, unexplained discomfort in your arms, back, neck, or jaw. °· You have shortness of breath at any time. °· You suddenly start to sweat, or your skin gets clammy. °· You feel nauseous. °· You vomit. °· You suddenly feel light-headed or dizzy. °· Your heart begins to beat quickly, or it feels like it is skipping beats. °These symptoms may be an emergency. Do not wait to see if the symptoms will go away. Get medical help right away. Call your local emergency services (911 in the [place] Do not drive yourself to the hospital. °  °This information is not intended to replace advice given to you by your health care provider. Make sure you discuss any questions you have with your health care provider. °  °Document Released: 08/12/2007 Document Revised: 03/16/2014 Document Reviewed: 09/29/2013 °Elsevier Interactive Patient Education ©2016 [employer]vier Inc. ° °

## 2015-05-16 NOTE — ED Notes (Signed)
AAOx3.  Skin warm and dry.  Respirations regular and non labored.  Called to patient room for c/o chest pain.  C/O right sided chest pain that is reproducible with light palpation.  No SOB/ DOE.   Will alert Dr. Archie Balboa.  Continue to monitor.

## 2015-05-16 NOTE — ED Notes (Signed)
Assisted pt to the restroom 

## 2015-05-16 NOTE — ED Notes (Signed)
Pt to rm 18 via EMS from home.  Pt report central CP starting about 40 min ago.  Pt took 1 nitro at home.  EMS report unable to complete 12 lead due to tremor, which is normal per pt, states essential tremor.  Pt NAD upon arrival, respirations equal and unlabored, skin warm and dry.

## 2015-05-17 DIAGNOSIS — R079 Chest pain, unspecified: Secondary | ICD-10-CM | POA: Insufficient documentation

## 2015-05-17 LAB — TROPONIN I

## 2015-05-17 NOTE — ED Provider Notes (Signed)
-----------------------------------------   1:18 AM on 05/17/2015 -----------------------------------------  Patient care assumed from Dr. Archie Balboa. Second troponin negative. We will discharge patient with cardiology follow-up as soon as possible. Patient states she will call later today for an appointment.  Harvest Dark, MD 05/17/15 [phone]

## 2015-05-22 ENCOUNTER — Encounter: Payer: Self-pay | Admitting: Psychiatry

## 2015-05-22 ENCOUNTER — Ambulatory Visit (INDEPENDENT_AMBULATORY_CARE_PROVIDER_SITE_OTHER): Payer: 59 | Admitting: Psychiatry

## 2015-05-22 DIAGNOSIS — F411 Generalized anxiety disorder: Secondary | ICD-10-CM | POA: Diagnosis not present

## 2015-05-22 DIAGNOSIS — F319 Bipolar disorder, unspecified: Secondary | ICD-10-CM

## 2015-05-22 MED ORDER — CARBAMAZEPINE ER 300 MG PO CP12: 300.0000 mg | ORAL_CAPSULE | Freq: Two times a day (BID) | ORAL | Status: DC

## 2015-05-22 MED ORDER — QUETIAPINE FUMARATE 50 MG PO TABS: 25.0000 mg | ORAL_TABLET | Freq: Every day | ORAL | Status: DC

## 2015-05-22 MED ORDER — QUETIAPINE FUMARATE 50 MG PO TABS: 50.0000 mg | ORAL_TABLET | Freq: Every day | ORAL | Status: DC

## 2015-05-22 MED ORDER — DIVALPROEX SODIUM ER 500 MG PO TB24: 500.0000 mg | ORAL_TABLET | Freq: Two times a day (BID) | ORAL | Status: DC

## 2015-05-22 MED ORDER — GABAPENTIN 300 MG PO CAPS: 300.0000 mg | ORAL_CAPSULE | Freq: Two times a day (BID) | ORAL | Status: DC

## 2015-05-22 NOTE — Addendum Note (Signed)
Addended byElvin So on: 05/22/2015 12:07 PM   Modules accepted: Orders

## 2015-05-22 NOTE — Progress Notes (Signed)
Patient ID: Stephanie [name], female   DOB: [birth year], 70 y.o.   MRN: [id number] [facility] MD/PA/NP OP Progress Note  05/22/2015 11:04 AM Stephanie [name]  MRN:  [id number]  Subjective:  Patient returns for follow-up of her bipolar disorder and generalized anxiety disorder. Patient was previously seen by Dr. Jimmye Norman. This is the first visit for this patient with this clinician. Patient reports that though she was initially depressed since her dog died in 04-05-2022. States that she is trying to get over that. However most recently she feels like she is having more manic symptoms. Her children have noticed that she is been writing long [employer]. Patient also reports she's been having trouble with sleeping more than usual. She also stated that at church she got up and started dancing. Stated that it was not wild but this is not like her usual behavior. She denies feeling grandiose or spending money excessively. She denies any irritability. She states that at this time she continues take her carbamazepine and Depakote.  She reports that the Seroquel has been helping her to feel calmer and more stabilized. She also states it helps somewhat with her sleep. Discussed with her that Xanax could lead to falls and increased sedation with memory impairment. She is okay with discontinuing the Xanax. Also discussed holding off on the trazodone since she's been feeling somewhat groggy throughout the day.   Chief Complaint: having manic symptoms Chief Complaint    Follow-up; Medication Refill     Visit Diagnosis:   No diagnosis found.  Past Medical History:  Past Medical History  Diagnosis Date  . Anxiety   . Bipolar disorder ([place])   . Fibromyalgia   . Hypothyroidism   . Heart failure ([place])   . Sleep apnea   . Fatigue   . Vaginal Pap smear, abnormal   . Heart disease   . HLD (hyperlipidemia)   . Raynaud phenomenon   . Bipolar 1 disorder ([place])   . Depression   . GERD (gastroesophageal reflux disease)   .  RA (rheumatoid arthritis) ([place])   . Perianal lesion     high grade sil lesion- keratinizing type  . Pelvic pain in female     Past Surgical History  Procedure Laterality Date  . Tonsillectomy     Family History:  Family History  Problem Relation Age of Onset  . Heart failure Father   . Parkinson's disease Mother   . Bipolar disorder Mother   . Depression Sister   . Schizophrenia Paternal Grandmother   . Diabetes Maternal Grandmother   . Pancreatic cancer Maternal Grandmother   . Cancer Neg Hx   . Heart disease Neg Hx    Social History:  Social History   Social History  . Marital Status: Divorced    Spouse Name: N/A  . Number of Children: N/A  . Years of Education: N/A   Social History Main Topics  . Smoking status: Never Smoker   . Smokeless tobacco: Former Systems developer  . Alcohol Use: No  . Drug Use: No  . Sexual Activity: No   Other Topics Concern  . None   Social History Narrative   Additional History:   Assessment:   Musculoskeletal: Strength & Muscle Tone: within normal limits Gait & Station: unsteady, Walking with a cane Patient leans: N/A  Psychiatric Specialty Exam: HPI  Review of Systems  Psychiatric/Behavioral: Negative for depression, suicidal ideas, hallucinations, memory loss and substance abuse. The patient is not nervous/anxious and does not have  insomnia.   All other systems reviewed and are negative.   Blood pressure 122/76, pulse 69, temperature 97.7 F (36.5 C), temperature source Tympanic, height 5' (1.524 m), weight 107 lb 6.4 oz (48.716 kg), SpO2 98 %.Body mass index is 20.97 kg/(m^2).  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  No. Has the patient been a risk to self in the past 6 months?  No. Has the patient been a risk to self within the distant past?  No. Is the patient a risk to others?  No. Has the patient been a risk to others in the past 6 months?  No. Has the patient been a risk to others within the distant past?  No.  Current Medications: Current Outpatient Prescriptions  Medication Sig Dispense Refill  . abatacept (ORENCIA) 250 MG injection Inject into the vein.    Marland Kitchen aspirin 81 MG tablet Take by mouth.    . Calcium Citrate-Vitamin D ([employer] CALCIUM CITRATE +D3 MINI) 200-250 MG-UNIT TABS Take by mouth.    . carbamazepine (TEGRETOL) 100 MG chewable tablet Chew 6 tablets (600 mg total) by mouth at bedtime. 540 tablet 1  . divalproex (DEPAKOTE ER) 500 MG 24 hr tablet Take 1 tablet (500 mg total) by mouth 2 (two) times daily. 99991111 tablet 1  . folic acid (FOLVITE) 1 MG tablet     . gabapentin (NEURONTIN) 300 MG capsule Take 1 capsule (300 mg total) by mouth 2 (two) times daily. 180 capsule 1  . levothyroxine (SYNTHROID) 75 MCG tablet Take by mouth.    Marland Kitchen LORazepam (ATIVAN) 0.5 MG tablet Take 0.5 tablets (0.25 mg total) by mouth at bedtime as needed for anxiety. 45 tablet 1  . methotrexate 2.5 MG tablet Take by mouth.    . MULTIPLE VITAMIN PO Take by mouth.    . nitroGLYCERIN (NITROSTAT) 0.4 MG SL tablet Place under the tongue.    . ondansetron (ZOFRAN) 8 MG tablet Take by mouth.    . QUEtiapine (SEROQUEL) 25 MG tablet Take 1 tablet (25 mg total) by mouth at bedtime. 90 tablet 1  . ranitidine (ZANTAC) 150 MG  tablet Take by mouth.    . traZODone (DESYREL) 50 MG tablet Take one half a tablet at bedtime as needed for sleep. 45 tablet 1   No current facility-administered medications for this visit.    Medical Decision Making:  Established Problem, Stable/Improving (1) and Review of Medication Regimen & Side Effects (2)  Treatment Plan Summary:Medication management and Plan We'll continue patient on her medications without any changes.  Bipolar disorder-   Continue Neurontin 300 mg twice daily,  Change the tegretol to 300mg  po bid,  Depakote 500 mg twice daily.  Increase  Seroquel to 50 mg at bedtime.  Discontinue the Alprazolam and Trazodone. Patient given a lab slip to obtain Depakote level and Tegretol level at her primary care physician's office. Patient reports this is where she gets some labs done. She is also also given a lab slip to get her liver function tests done. Return to clinic in 2 weeks time or call before if necessary.  She's been encouraged call any questions or concerns prior to her next appointment.   Alliene Klugh 05/22/2015, 11:04 AM

## 2015-05-24 ENCOUNTER — Telehealth: Payer: Self-pay

## 2015-05-24 NOTE — Telephone Encounter (Signed)
spoke with patient and advised pt on what to do.  pt wanted me to call pharmacy and let them know

## 2015-05-24 NOTE — Telephone Encounter (Signed)
Called dr. Einar Grad and explained what was going on  and she advised to tell patient to take seroquel 50mg  bid instead of at bedtime and if she needed to she can take a ativan only if she really needed it

## 2015-05-24 NOTE — Telephone Encounter (Signed)
called the [facility] office to see if Stephanie [name] could advise since we have no doctor here in the office today.  Per Stephanie [name] they would wait until they got the labwork should call dr.ravi

## 2015-05-24 NOTE — Telephone Encounter (Signed)
pt called again and ast that she not doing good. she feels wired up not sleeping.

## 2015-05-24 NOTE — Telephone Encounter (Signed)
pt called left message with answering services that she was losing track of time and that she is manic and that her medication change was not working.  She has been chronic fatigue and verbal diarrhea keeps texting and talking to friends.  thank that her medications need to be adjusted.

## 2015-05-24 NOTE — Telephone Encounter (Signed)
spoke with pharmacy they will fix patient medication with the changes.

## 2015-06-06 ENCOUNTER — Encounter: Payer: Self-pay | Admitting: Psychiatry

## 2015-06-06 ENCOUNTER — Ambulatory Visit (INDEPENDENT_AMBULATORY_CARE_PROVIDER_SITE_OTHER): Payer: 59 | Admitting: Psychiatry

## 2015-06-06 VITALS — BP 122/86 | HR 69 | Temp 97.9°F | Ht 60.0 in | Wt 109.0 lb

## 2015-06-06 DIAGNOSIS — F3110 Bipolar disorder, current episode manic without psychotic features, unspecified: Secondary | ICD-10-CM

## 2015-06-06 NOTE — Progress Notes (Signed)
Patient ID: Stephanie [name], female   DOB: Oct 20, 1945, 70 y.o.   MRN: [id number] [facility] MD/PA/NP OP Progress Note  06/06/2015 11:51 AM Stephanie [name]  MRN:  [id number]  Subjective:  Patient returns for follow-up of her bipolar disorder and generalized anxiety disorder.  Patient reports she was able to tolerate the Seroquel . It has helped with decreasing the manic symptoms. States she took the ativan sometimes though she was told not to. She states that at this time she continues to take her carbamazepine and Depakote. She got her labs done.  States they were normal. She just got started on Pravachol and states she has had several side effects with muscle aches, fatigue , dry mouth and difficulty sleeping. Patient reports that she ordered something off the Internet called [employer], and then she took it she started having diarrhea and stomach pains. She reports reporting this medication to [facility].  Overall she reports doing better. She denies any suicidal thoughts. She did get her lab work done.  Chief Complaint: doing better Chief Complaint    Follow-up; Medication Refill     Visit Diagnosis:   No diagnosis found.  Past Medical History:  Past Medical History  Diagnosis Date  . Anxiety   . Bipolar disorder ([place])   . Fibromyalgia   . Hypothyroidism   . Heart failure ([place])   . Sleep apnea   . Fatigue   . Vaginal Pap smear, abnormal   . Heart disease   . HLD (hyperlipidemia)   . Raynaud phenomenon   . Bipolar 1 disorder ([place])   . Depression   . GERD (gastroesophageal reflux disease)   . RA (rheumatoid arthritis) ([place])   . Perianal lesion     high grade sil lesion- keratinizing type  . Pelvic pain in female     Past Surgical History  Procedure Laterality Date  . Tonsillectomy     Family History:  Family History  Problem Relation Age of Onset  . Heart failure Father   . Parkinson's disease Mother   . Bipolar disorder Mother   . Depression Sister   . Schizophrenia  Paternal Grandmother   . Diabetes Maternal Grandmother   . Pancreatic cancer Maternal Grandmother   . Cancer Neg Hx   . Heart disease Neg Hx    Social History:  Social History   Social History  . Marital Status: Divorced    Spouse Name: N/A  . Number of Children: N/A  . Years of Education: N/A   Social History Main Topics  . Smoking status: Never Smoker   . Smokeless tobacco: Former Systems developer  . Alcohol Use: No  . Drug Use: No  . Sexual Activity: No   Other Topics Concern  . None   Social History Narrative   Additional History:   Assessment:   Musculoskeletal: Strength & Muscle Tone: within normal limits Gait & Station: unsteady, Walking with a cane Patient leans: N/A  Psychiatric Specialty Exam: HPI  Review of Systems  Psychiatric/Behavioral: Negative for depression, suicidal ideas, hallucinations, memory loss and substance abuse. The patient is not nervous/anxious and does not have insomnia.   All other systems reviewed and are negative.   Blood pressure 122/86, pulse 69, temperature 97.9 F (36.6 C), temperature source Tympanic, height 5' (1.524 m), weight 109 lb (49.442 kg), SpO2 93 %.Body mass index is 21.29 kg/(m^2).  General Appearance: Neat and Well Groomed  Eye Contact:  Good  Speech:  Normal Rate  Volume:  Normal  Mood:  Good  Affect:  Congruent  Thought Process:  Linear and Logical  Orientation:  Full (Time, Place, and Person)  Thought Content:  Negative  Suicidal Thoughts:  No  Homicidal Thoughts:  No  Memory:  Immediate;   Good Recent;   Good Remote;   Good  Judgement:  Good  Insight:  Good  Psychomotor Activity:  Negative  Concentration:  Good  Recall:  Good  Fund of Knowledge: Good  Language: Good  Akathisia:  Negative  Handed:  Right unknown   AIMS (if indicated):    Assets:  Communication Skills Desire for Improvement Social Support  ADL's:  Intact  Cognition: WNL  Sleep:  good   Is the patient at risk to self?  No. Has the  patient been a risk to self in the past 6 months?  No. Has the patient been a risk to self within the distant past?  No. Is the patient a risk to others?  No. Has the patient been a risk to others in the past 6 months?  No. Has the patient been a risk to others within the distant past?  No.  Current Medications: Current Outpatient Prescriptions  Medication Sig Dispense Refill  . abatacept (ORENCIA) 250 MG injection Inject into the vein.    Marland Kitchen aspirin 81 MG tablet Take by mouth.    . Calcium Citrate-Vitamin D ([employer]S CALCIUM CITRATE +D3 MINI) 200-250 MG-UNIT TABS Take by mouth.    . carbamazepine (CARBATROL) 300 MG 12 hr capsule Take 1 capsule (300 mg total) by mouth 2 (two) times daily. 60 capsule 2  . divalproex (DEPAKOTE ER) 500 MG 24 hr tablet Take 1 tablet (500 mg total) by mouth 2 (two) times daily. 60 tablet 2  . folic acid (FOLVITE) 1 MG tablet     . gabapentin (NEURONTIN) 300 MG capsule Take 1 capsule (300 mg total) by mouth 2 (two) times daily. 60 capsule 2  . levothyroxine (SYNTHROID) 75 MCG tablet Take by mouth.    Marland Kitchen LORazepam (ATIVAN) 0.5 MG tablet     . methotrexate 2.5 MG tablet Take by mouth.    . MULTIPLE VITAMIN PO Take by mouth.    . nitroGLYCERIN (NITROSTAT) 0.4 MG SL tablet Place under the tongue.    . ondansetron (ZOFRAN) 8 MG tablet Take by mouth.    . pravastatin (PRAVACHOL) 10 MG tablet     . QUEtiapine (SEROQUEL) 50 MG tablet Take 1 tablet (50 mg total) by mouth at bedtime. (Patient taking differently: Take 50 mg by mouth 2 (two) times daily. ) 30 tablet 2  . ranitidine (ZANTAC) 150 MG tablet Take by mouth.     No current facility-administered medications for this visit.    Medical Decision Making:  Established Problem, Stable/Improving (1) and Review of Medication Regimen & Side Effects (2)  Treatment Plan Summary:Medication management and Plan We'll continue patient on her medications without any changes.  Bipolar disorder-   Continue Neurontin 300 mg twice  daily,  Change the tegretol to 300mg  po bid, Depakote 500 mg twice daily.  Continue  Seroquel to 50 mg po bid. Patient states that she got her lab work done. We will obtain her lab work results.  Return to clinic in 2 months.  She's been encouraged call any questions or concerns prior to her next appointment.   Aidon Klemens 06/06/2015, 11:51 AM

## 2015-06-12 ENCOUNTER — Other Ambulatory Visit: Payer: Self-pay

## 2015-06-12 NOTE — Telephone Encounter (Signed)
breanna called from [facility], they states that they need a new rx for pt ativan

## 2015-06-13 NOTE — Telephone Encounter (Signed)
She has been discontinued on the ativan. I will not be prescribing this medication any more.

## 2015-07-08 ENCOUNTER — Other Ambulatory Visit: Payer: Self-pay | Admitting: Psychiatry

## 2015-07-08 ENCOUNTER — Other Ambulatory Visit: Payer: Self-pay

## 2015-07-08 NOTE — Telephone Encounter (Signed)
PT STATES SHE DOESN'T HAVE ANY REFILL ON MEDICATION,  RX FOR QUEtiapine (SEROQUEL) 50 MG tablet

## 2015-07-09 ENCOUNTER — Other Ambulatory Visit (HOSPITAL_COMMUNITY): Payer: Self-pay | Admitting: Psychiatry

## 2015-07-09 MED ORDER — QUETIAPINE FUMARATE 50 MG PO TABS: 50.0000 mg | ORAL_TABLET | Freq: Two times a day (BID) | ORAL | Status: DC

## 2015-07-09 NOTE — Telephone Encounter (Signed)
per dr. Einar Grad order ok to call in rx for seroquel 50mg  bid #60 . rx called in

## 2015-08-06 ENCOUNTER — Ambulatory Visit: Payer: Medicare Other | Attending: Rheumatology | Admitting: Occupational Therapy

## 2015-08-06 ENCOUNTER — Encounter: Payer: Self-pay | Admitting: Occupational Therapy

## 2015-08-06 DIAGNOSIS — M79641 Pain in right hand: Secondary | ICD-10-CM | POA: Insufficient documentation

## 2015-08-06 DIAGNOSIS — R278 Other lack of coordination: Secondary | ICD-10-CM | POA: Diagnosis present

## 2015-08-06 DIAGNOSIS — R609 Edema, unspecified: Secondary | ICD-10-CM | POA: Insufficient documentation

## 2015-08-06 DIAGNOSIS — R600 Localized edema: Secondary | ICD-10-CM

## 2015-08-06 DIAGNOSIS — M25541 Pain in joints of right hand: Secondary | ICD-10-CM | POA: Diagnosis present

## 2015-08-06 NOTE — Patient Instructions (Signed)
Flexor Tendon Gliding (Active Full Fist)    Straighten all fingers, then make a fist, bending all joints. Repeat ____ times. Do ____ sessions per day.  Flexor Tendon Gliding (Active Hook Fist)    With fingers and knuckles straight, bend middle and tip joints. Do not bend large knuckles. Repeat ____ times. Do ____ sessions per day.  Copyright  VHI. All rights reserved.  Opposition (Active)    Touch tip of thumb to nail tip of each finger in turn, making an "O" shape. Repeat ____ times. Do ____ sessions per day.   Thumb RD exercises with hand flat on table - See handout as issued in clinic.

## 2015-08-06 NOTE — Therapy (Signed)
[facility] [address] [place], [address] Phone: [phone]   Fax:  [phone]  Occupational Therapy Evaluation  Patient Details  Name: Stephanie [name] MRN: [id number] Date of Birth: 06/24/1945 Referring Provider: Dr Sammuel Bailiff  Encounter Date: 08/06/2015      OT End of Session - 08/06/15 1247    Equipment Utilized During Treatment Paraffin right hand   Activity Tolerance Patient tolerated treatment well   Behavior During Therapy [facility] for tasks assessed/performed      Past Medical History  Diagnosis Date  . Anxiety   . Bipolar disorder ([place])   . Fibromyalgia   . Hypothyroidism   . Heart failure ([place])   . Sleep apnea   . Fatigue   . Vaginal Pap smear, abnormal   . Heart disease   . HLD (hyperlipidemia)   . Raynaud phenomenon   . Bipolar 1 disorder ([place])   . Depression   . GERD (gastroesophageal reflux disease)   . RA (rheumatoid arthritis) ([place])   . Perianal lesion     high grade sil lesion- keratinizing type  . Pelvic pain in female     Past Surgical History  Procedure Laterality Date  . Tonsillectomy      There were no vitals filed for this visit.      Subjective Assessment - 08/06/15 0959    Subjective  Pt reports several weeks of right hand pain. She has a PMH of RA and expresses difficulty with difficulty palying piano, writing and dressing etc.   Currently in Pain? Yes   Pain Score 5    Pain Location Hand   Pain Orientation Right   Pain Descriptors / Indicators Aching;Sore;Radiating   Pain Onset 1 to 4 weeks ago   Pain Frequency Intermittent   Pain Relieving Factors Pain medication (naproxen or Alieve)   Effect of Pain on Daily Activities Increased functional use right hand; daily activities (writing, playing piano)           [facility] OT Assessment - 08/06/15 0001    Assessment   Diagnosis RA with right hand pain   Referring Provider Dr Sammuel Bailiff   Onset Date  07/22/15   Prior Therapy Pt reports that she has had hand therapy in the past, she states that current symptoms began about 2 weeks ago and was referred today for OT Assessment   Precautions   Required Braces or Orthoses Other Brace/Splint  PRN "I have some splints, but I didn't think to put one on"   Home  Environment   Lives With Alone   Prior Function   Level of Independence Independent with basic ADLs;Needs assistance with homemaking  "I have a housekeeper for moping, vaccumming"   Vocation Retired;On disability   Leisure Go to movies, goes out to eat   ADL   ADL comments Mod I basic ADL's (Pain with writing, dressing)   Written Expression   Dominant Hand Right   Handwriting --  Pt reports increased pain with handwriting   Cognition   Overall Cognitive Status --  See PMH   Observation/Other Assessments   Quick DASH  77.27% impaired   Outcome Measures 14/20 Optional performing arts module of Quick DASH   Sensation   Light Touch Appears Intact   Semmes Weinstein Monofilament Scale Normal   Edema   Edema Yes  Related to RA per pt report   ROM / Strength   AROM / PROM / Strength  AROM   AROM   Overall AROM  Due to pain;Deficits   Overall AROM Comments Pt is able to make 75% of fist with right hand. Flat fist is WFL's, unable to make composite fist right.   AROM Assessment Site Finger   Right/Left Finger Right   Right Composite Finger Extension 75%   Right Composite Finger Flexion 75%  Impaired secondary to RA   Hand Function   Right Hand Gross Grasp Impaired  Pt has h/o RA bilateral hands   Right Hand Grip (lbs) 10  R = 10# vs L = 20#   Right Hand Lateral Pinch 7 lbs  R = 7# vs L = 9#   Right Hand 3 Point Pinch 6 lbs  6# R vs L = 9#   Comment Opposition to                   OT Treatments/Exercises (OP) - 08/06/15 0001    Fine Motor Coordination   Other Fine Motor Exercises Tendon gliding; palm flat on table and performing RD one digit at a time starting  with thumb; opposition.  VC's, tc's required   Modalities   Modalities Paraffin   RUE Paraffin   Number Minutes Paraffin 10 Minutes   RUE Paraffin Location Hand   Comments To assist with joint pain/relief right hand      Flexor Tendon Gliding (Active Full Fist)    Straighten all fingers, then make a fist, bending all joints. Repeat ____ times. Do ____ sessions per day.  Flexor Tendon Gliding (Active Hook Fist)    With fingers and knuckles straight, bend middle and tip joints. Do not bend large knuckles. Repeat ____ times. Do ____ sessions per day.  Copyright  VHI. All rights reserved.  Opposition (Active)    Touch tip of thumb to nail tip of each finger in turn, making an "O" shape. Repeat ____ times. Do ____ sessions per day.   Thumb RD exercises with hand flat on table - See handout as issued in clinic.            OT Education - 08/06/15 1245    Education provided Yes   Education Details HEP/AROM; Pt may use paraffin unit that she has at home PRN for joint pain relief; bring gripper & splint to next visit (pt states that she was previously issued these & has at home).   Person(s) Educated Patient   Methods Explanation;Demonstration;Tactile cues;Verbal cues;Handout   Comprehension Verbalized understanding;Returned demonstration;Verbal cues required;Tactile cues required;Need further instruction          OT Short Term Goals - 08/06/15 1254    OT SHORT TERM GOAL #1   Title Pt wil lbe Mod I AROM/HEP ex's right hand   Time 3   Period Weeks   Status New   OT SHORT TERM GOAL #2   Title Pt will be Mod I stating 1-2 joint protection techniques that she could perform at home while doing ADL's to minimize stress and pain on joints   Time 3   Period Weeks   Status New           OT Long Term Goals - 08/06/15 1256    OT LONG TERM GOAL #1   Title Pt will be Mod I joint protection techniques for use during ADL's   Time 6   Period Weeks   Status New   OT  LONG TERM GOAL #2   Title Pt will be mod I stating energy  conservation techniques and implement during simulated ADL   Time 6   Period Weeks   Status New   OT LONG TERM GOAL #3   Title Pt will report decreased pain during functional hand use as seen by pain rating as 3/10 or less   Time 6   Period Weeks   Status New   OT LONG TERM GOAL #4   Title Pt will show improved Quick DASH rating as 50% or less   Time 6   Period Weeks   Status New   OT LONG TERM GOAL #5   Title Pt will be Mod I upgraded home program related to RA   Time 6   Period Weeks   Status New               Plan - 08/06/15 1247    Clinical Impression Statement Pt is a 70 y/o RHD female w/ a h/o RA and is currently experiencing "a flare". Due to recent flare up of symptoms, she has had increased pain, decreased functional use of right dominant hand and difficulty wrighting and playing her piano. She also reports significant fatigue. All of her symptoms are affecting her ability to perform ADL's and leisure activities. She should benefit from out-pt OT to address deficits and assist in mazimizing independence with ADL's and right hand functional use.   Rehab Potential Good   OT Frequency 1x / week  1-2x/week for 6 weeks   OT Duration 6 weeks   OT Treatment/Interventions Self-care/ADL training;Therapeutic exercise;Functional Mobility Training;Patient/family education;Splinting;Manual Therapy;Ultrasound;Energy conservation;Therapeutic exercises;Therapeutic activities;DME and/or AE instruction;Parrafin;Fluidtherapy;Moist Heat   Plan Paraffin right hand; review HEP/AROM e'x's. Splint and gripper check (pt has at home & uses, therapist should check to see if recommend d/c or cont to use during current flare-up).   OT Home Exercise Plan See pt instruction   Consulted and Agree with Plan of Care Patient      Patient will benefit from skilled therapeutic intervention in order to improve the following deficits and  impairments:  Decreased coordination, Decreased range of motion, Increased edema, Decreased activity tolerance, Pain, Impaired UE functional use, Decreased knowledge of use of DME, Decreased strength, Impaired perceived functional ability  Visit Diagnosis: Hand pain, right - Plan: Ot plan of care cert/re-cert  Other lack of coordination - Plan: Ot plan of care cert/re-cert  Pain in joint of right hand - Plan: Ot plan of care cert/re-cert  Hand edema - Plan: Ot plan of care cert/re-cert      G-Codes - [id number]    Functional Assessment Tool Used Quick DASH   Functional Limitation Carrying, moving and handling objects   Carrying, Moving and Handling Objects Current Status [id number]) At least 60 percent but less than 80 percent impaired, limited or restricted   Carrying, Moving and Handling Objects Goal Status [id number]) At least 20 percent but less than 40 percent impaired, limited or restricted      Problem List Patient Active Problem List   Diagnosis Date Noted  . Chest pain 05/17/2015  . OP (osteoporosis) 02/25/2015  . Sjogren's syndrome ([place]) 02/20/2015  . Combined fat and carbohydrate induced hyperlipemia 10/03/2014  . Breathlessness on exertion 09/24/2014  . Bipolar 1 disorder with moderate mania ([place]) 08/29/2014  . Affective bipolar disorder ([place]) 08/29/2014  . H/O gastrointestinal disease 08/29/2014  . Anxiety, generalized 08/29/2014  . Fibromyalgia 08/29/2014  . H/O elevated lipids 08/29/2014  . H/O: hypothyroidism 08/29/2014  . Insomnia, persistent 08/29/2014  . 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  Chronic pain associated with significant psychosocial dysfunction 08/29/2014  . 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 Rheumatoid arthritis ([place]) 08/29/2014  . Benign essential HTN 01/10/2014  . 3-vessel CAD 01/10/2014  . Polypharmacy 06/29/2013  . Other long term (current) drug therapy 06/29/2013  . Arthritis or  polyarthritis, rheumatoid ([place]) 06/22/2013  . Acid reflux 05/05/2012  . Adult hypothyroidism 01/07/2012  . Flu vaccine need 01/07/2012  . Cough 12/14/2011  . CD (contact dermatitis) 10/15/2011  . Finger wound, simple, open 10/15/2011    Journi [employer], OTR/L 08/06/2015, 1:05 PM  [facility] [address], [place], [address] Phone: [phone]   Fax:  [phone]  Name: Stephanie [name] MRN: [id number] Date of Birth: 09-May-1945

## 2015-08-07 ENCOUNTER — Encounter: Payer: Self-pay | Admitting: Psychiatry

## 2015-08-07 ENCOUNTER — Ambulatory Visit (INDEPENDENT_AMBULATORY_CARE_PROVIDER_SITE_OTHER): Payer: 59 | Admitting: Psychiatry

## 2015-08-07 VITALS — BP 122/86 | HR 69 | Temp 97.4°F | Ht 60.0 in | Wt 107.8 lb

## 2015-08-07 DIAGNOSIS — F3110 Bipolar disorder, current episode manic without psychotic features, unspecified: Secondary | ICD-10-CM

## 2015-08-07 MED ORDER — DIVALPROEX SODIUM ER 500 MG PO TB24: 500.0000 mg | ORAL_TABLET | Freq: Two times a day (BID) | ORAL | Status: DC

## 2015-08-07 MED ORDER — QUETIAPINE FUMARATE 50 MG PO TABS: 50.0000 mg | ORAL_TABLET | Freq: Two times a day (BID) | ORAL | Status: DC

## 2015-08-07 MED ORDER — CARBAMAZEPINE ER 300 MG PO CP12: 300.0000 mg | ORAL_CAPSULE | Freq: Two times a day (BID) | ORAL | Status: DC

## 2015-08-07 NOTE — Progress Notes (Signed)
Patient ID: Stephanie [name], female   DOB: 1945-05-14, 70 y.o.   MRN: [id number] [facility] MD/PA/NP OP Progress Note  08/07/2015 10:31 AM Stephanie [name]  MRN:  [id number]  Subjective:  Patient returns for follow-up of her bipolar disorder and generalized anxiety disorder. Patient reports that she has been having a bout of arthritis in her hands and it has been very painful. States that the pain has restricted her activities and that has made her depressed. States that overall her mood has been okay but down that the restricted activity is making her feel down. She is been sleeping okay and eating okay. She has been started on some new medication for her on cholesterol and it is now under control at 195. She feels that the Seroquel has been a stabilizing her mood. Denies any suicidal thoughts.  Chief Complaint: Somewhat depressed due to restricted activity from arthritis  Visit Diagnosis:     ICD-9-CM ICD-10-CM   1. Bipolar I disorder, most recent episode (or current) manic ([place]) 296.40 F31.10     Past Medical History:  Past Medical History  Diagnosis Date  . Anxiety   . Bipolar disorder ([place])   . Fibromyalgia   . Hypothyroidism   . Heart failure ([place])   . Sleep apnea   . Fatigue   . Vaginal Pap smear, abnormal   . Heart disease   . HLD (hyperlipidemia)   . Raynaud phenomenon   . Bipolar 1 disorder ([place])   . Depression   . GERD (gastroesophageal reflux disease)   . RA (rheumatoid arthritis) ([place])   . Perianal lesion     high grade sil lesion- keratinizing type  . Pelvic pain in female     Past Surgical History  Procedure Laterality Date  . Tonsillectomy     Family History:  Family History  Problem Relation Age of Onset  . Heart failure Father   . Parkinson's disease Mother   . Bipolar disorder Mother   . Depression Sister   . Schizophrenia Paternal Grandmother   . Diabetes Maternal Grandmother   . Pancreatic cancer Maternal Grandmother   . Cancer Neg Hx   . Heart disease Neg  Hx    Social History:  Social History   Social History  . Marital Status: Divorced    Spouse Name: N/A  . Number of Children: N/A  . Years of Education: N/A   Social History Main Topics  . Smoking status: Never Smoker   . Smokeless tobacco: Former Systems developer  . Alcohol Use: No  . Drug Use: No  . Sexual Activity: No   Other Topics Concern  . Not on file   Social History Narrative   Additional History:   Assessment:   Musculoskeletal: Strength & Muscle Tone: within normal limits Gait & Station: unsteady, Walking with a cane Patient leans: N/A  Psychiatric Specialty Exam: HPI  Review of Systems  Psychiatric/Behavioral: Negative for depression, suicidal ideas, hallucinations, memory loss and substance abuse. The patient is not nervous/anxious and does not have insomnia.   All other systems reviewed and are negative.   There were no vitals taken for this visit.There is no weight on file to calculate BMI.  General Appearance: Neat and Well Groomed  Eye Contact:  Good  Speech:  Normal Rate  Volume:  Normal  Mood:  Feeling down due to arthritis   Affect:  Congruent  Thought Process:  Linear and Logical  Orientation:  Full (Time, Place, and Person)  Thought Content:  Negative  Suicidal Thoughts:  No  Homicidal Thoughts:  No  Memory:  Immediate;   Good Recent;   Good Remote;   Good  Judgement:  Good  Insight:  Good  Psychomotor Activity:  Negative  Concentration:  Good  Recall:  Good  Fund of Knowledge: Good  Language: Good  Akathisia:  Negative  Handed:  Right unknown   AIMS (if indicated):    Assets:  Communication Skills Desire for Improvement Social Support  ADL's:  Intact  Cognition: WNL  Sleep:  good   Is the patient at risk to self?  No. Has the patient been a risk to self in the past 6 months?  No. Has the patient been a risk to self within the distant past?  No. Is the patient a risk to others?  No. Has the patient been a risk to others in the past 6  months?  No. Has the patient been a risk to others within the distant past?  No.  Current Medications: Current Outpatient Prescriptions  Medication Sig Dispense Refill  . abatacept (ORENCIA) 250 MG injection Inject into the vein.    Marland Kitchen aspirin 81 MG tablet Take by mouth.    . Calcium Citrate-Vitamin D ([employer] CALCIUM CITRATE +D3 MINI) 200-250 MG-UNIT TABS Take by mouth.    . carbamazepine (CARBATROL) 300 MG 12 hr capsule Take 1 capsule (300 mg total) by mouth 2 (two) times daily. 60 capsule 2  . divalproex (DEPAKOTE ER) 500 MG 24 hr tablet Take 1 tablet (500 mg total) by mouth 2 (two) times daily. 60 tablet 2  . folic acid (FOLVITE) 1 MG tablet     . gabapentin (NEURONTIN) 300 MG capsule Take 1 capsule (300 mg total) by mouth 2 (two) times daily. 60 capsule 2  . levothyroxine (SYNTHROID) 75 MCG tablet Take by mouth.    Marland Kitchen LORazepam (ATIVAN) 0.5 MG tablet     . methotrexate 2.5 MG tablet Take by mouth.    . MULTIPLE VITAMIN PO Take by mouth.    . nitroGLYCERIN (NITROSTAT) 0.4 MG SL tablet Place under the tongue.    . ondansetron (ZOFRAN) 8 MG tablet Take by mouth.    . pravastatin (PRAVACHOL) 10 MG tablet     . QUEtiapine (SEROQUEL) 50 MG tablet Take 1 tablet (50 mg total) by mouth 2 (two) times daily. 60 tablet 0  . ranitidine (ZANTAC) 150 MG tablet Take by mouth.     No current facility-administered medications for this visit.    Medical Decision Making:  Established Problem, Stable/Improving (1) and Review of Medication Regimen & Side Effects (2)  Treatment Plan Summary:Medication management and Plan We'll continue patient on her medications without any changes.  Bipolar disorder-   Continue Neurontin 300 mg twice daily,  Continue tegretol at 300mg  po bid, Depakote 500 mg twice daily.  Continue  Seroquel to 50 mg po bid. Lab results not received from her primary care and we will make a request again.  Arthritis patient working with her doctors and also using a paraffin wrap  to help  with the pain  Return to clinic in 2 months.  She's been encouraged call any questions or concerns prior to her next appointment.   Bardia Wangerin 08/07/2015, 10:31 AM

## 2015-08-09 ENCOUNTER — Ambulatory Visit: Payer: Medicare Other | Admitting: Occupational Therapy

## 2015-08-13 ENCOUNTER — Other Ambulatory Visit: Payer: Self-pay

## 2015-08-13 ENCOUNTER — Other Ambulatory Visit: Payer: Self-pay | Admitting: Psychiatry

## 2015-08-13 NOTE — Telephone Encounter (Signed)
called in ok for carbamazepine 100mg  chewable tablets take 6 tablets by mouth at bedtime per Dr. Einar Grad

## 2015-08-13 NOTE — Telephone Encounter (Signed)
pharmacy sent over a fax stating that pt was not taking carbatrol but she is taking carbamazepine 100mg  chewable tablet directions chew 6 tablets by mouth at bedtime.  they need a corrected rx sent in

## 2015-08-14 ENCOUNTER — Ambulatory Visit: Payer: Medicare Other | Attending: Rheumatology | Admitting: Occupational Therapy

## 2015-08-14 DIAGNOSIS — M79641 Pain in right hand: Secondary | ICD-10-CM | POA: Insufficient documentation

## 2015-08-14 DIAGNOSIS — R278 Other lack of coordination: Secondary | ICD-10-CM | POA: Diagnosis present

## 2015-08-14 DIAGNOSIS — M25541 Pain in joints of right hand: Secondary | ICD-10-CM | POA: Diagnosis present

## 2015-08-14 DIAGNOSIS — R609 Edema, unspecified: Secondary | ICD-10-CM | POA: Insufficient documentation

## 2015-08-14 DIAGNOSIS — R600 Localized edema: Secondary | ICD-10-CM

## 2015-08-14 NOTE — Patient Instructions (Signed)
joint protection - hand out  Picking and carrying bags, holding books, painting and piano As well as dog leash that can be attach to waist and not hold in hand  Can opener for pop caps   tendon glides Need to block intrinsic fist  Stop when feeling pull  Opposition  And RD of digits walking with palm on table  8 reps  2x day after parafin at home

## 2015-08-14 NOTE — Therapy (Signed)
[place] [facility] [address], [place], [address] Phone: [phone]   Fax:  [phone]  Occupational Therapy Treatment  Patient Details  Name: Stephanie [name] MRN: [id number] Date of Birth: 20-Jun-1945 Referring Provider: Dr Sammuel Bailiff  Encounter Date: 08/14/2015      OT End of Session - 08/14/15 1054    Visit Number 2   Number of Visits 4   Date for OT Re-Evaluation 09/17/15   Authorization Type UHC Medicare needs G Code every 10 visits   OT Start Time [phone]   OT Stop Time 1034   OT Time Calculation (min) 51 min   Equipment Utilized During Treatment Paraffin right hand   Activity Tolerance Patient tolerated treatment well   Behavior During Therapy [facility] for tasks assessed/performed      Past Medical History  Diagnosis Date  . Anxiety   . Bipolar disorder ([place])   . Fibromyalgia   . Hypothyroidism   . Heart failure ([place])   . Sleep apnea   . Fatigue   . Vaginal Pap smear, abnormal   . Heart disease   . HLD (hyperlipidemia)   . Raynaud phenomenon   . Bipolar 1 disorder ([place])   . Depression   . GERD (gastroesophageal reflux disease)   . RA (rheumatoid arthritis) ([place])   . Perianal lesion     high grade sil lesion- keratinizing type  . Pelvic pain in female     Past Surgical History  Procedure Laterality Date  . Tonsillectomy      There were no vitals filed for this visit.      Subjective Assessment - 08/14/15 1051    Subjective  I think it flared up because of few wks ago trid to adopt dog and he was just to much for me to handle -  I did bring that gripper for you to look at - if it is good for me or to much - did see Dr Jefm Bryant yesterday    Patient Stated Goals Need to get my hand pain better again so I can use it better and do home program at home    Pain Score 7    Pain Location Hand   Pain Orientation Right   Pain Descriptors / Indicators Aching;Sore   Pain Onset 1 to 4 weeks ago                      OT Treatments/Exercises (OP) - 08/14/15 0001    RUE Paraffin   Number Minutes Paraffin 10 Minutes   RUE Paraffin Location Hand   Comments at [facility] to decrease pain and increase ROM at digits       After parafin  Reviewed with pt joint protection - hand out  Picking and carrying bags, holding books, painting and piano As well as dog leash that can be attach to waist and not hold in hand  Can opener for pop caps   Reviewed HEP for tendon glides Need to block intrinsic fist  Stop when feeling pull  Opposition  And RD of digits walking with palm on table  8 reps  2x day after parafin at home            OT Education - 08/14/15 1054    Education provided Yes   Education Details HEP , joint protection prinicples    Person(s) Educated Patient   Methods Explanation;Demonstration;Tactile cues;Verbal cues;Handout   Comprehension Verbal cues required;Returned  demonstration;Verbalized understanding          OT Short Term Goals - 08/06/15 1254    OT SHORT TERM GOAL #1   Title Pt wil lbe Mod I AROM/HEP ex's right hand   Time 3   Period Weeks   Status New   OT SHORT TERM GOAL #2   Title Pt will be Mod I stating 1-2 joint protection techniques that she could perform at home while doing ADL's to minimize stress and pain on joints   Time 3   Period Weeks   Status New           OT Long Term Goals - 08/06/15 1256    OT LONG TERM GOAL #1   Title Pt will be Mod I joint protection techniques for use during ADL's   Time 6   Period Weeks   Status New   OT LONG TERM GOAL #2   Title Pt will be mod I stating energy conservation techniques and implement during simulated ADL   Time 6   Period Weeks   Status New   OT LONG TERM GOAL #3   Title Pt will report decreased pain during functional hand use as seen by pain rating as 3/10 or less   Time 6   Period Weeks   Status New   OT LONG TERM GOAL #4   Title Pt will show improved Quick DASH rating  as 50% or less   Time 6   Period Weeks   Status New   OT LONG TERM GOAL #5   Title Pt will be Mod I upgraded home program related to RA   Time 6   Period Weeks   Status New               Plan - 08/14/15 1055    Clinical Impression Statement Pt report pain better after parafin - but R hand cont to show decrease PIP's ROM - hyper extention - and some dislocation at [address] [facility] that is new from few years ago - pt also carrying this date 2 bags in hands - reviewed  joint protection   - pt did bring in hand gripper tthat daughters gave her - but  told pt to hold off on that  because of flare up and subluxation of 3rd at [facility]  - need several cueing verbally  - and tactile - and  repeat -   Rehab Potential Good   OT Frequency 1x / week   OT Duration 4 weeks   OT Treatment/Interventions Self-care/ADL training;Therapeutic exercise;Functional Mobility Training;Patient/family education;Splinting;Manual Therapy;Ultrasound;Energy conservation;Therapeutic exercises;Therapeutic activities;DME and/or AE instruction;Parrafin;Fluidtherapy;Moist Heat   Plan assess pain improve and using paraffin at home - splint to bring next time and review HEP and joint protection    OT Home Exercise Plan See pt instruction   Consulted and Agree with Plan of Care Patient      Patient will benefit from skilled therapeutic intervention in order to improve the following deficits and impairments:  Decreased coordination, Decreased range of motion, Increased edema, Decreased activity tolerance, Pain, Impaired UE functional use, Decreased knowledge of use of DME, Decreased strength, Impaired perceived functional ability  Visit Diagnosis: Hand pain, right  Pain in joint of right hand  Hand edema  Other lack of coordination    Problem List Patient Active Problem List   Diagnosis Date Noted  . Chest pain 05/17/2015  . OP (osteoporosis) 02/25/2015  . Sjogren's syndrome ([place]) 02/20/2015  . Combined fat and  carbohydrate induced hyperlipemia 10/03/2014  . Breathlessness on exertion 09/24/2014  . Bipolar 1 disorder with moderate mania ([place]) 08/29/2014  . Affective bipolar disorder ([place]) 08/29/2014  . H/O gastrointestinal disease 08/29/2014  . Anxiety, generalized 08/29/2014  . Fibromyalgia 08/29/2014  . H/O elevated lipids 08/29/2014  . H/O: hypothyroidism 08/29/2014  . Insomnia, persistent 08/29/2014  . 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 Chronic pain associated with significant psychosocial dysfunction 08/29/2014  . 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 Rheumatoid arthritis ([place]) 08/29/2014  . Benign essential HTN 01/10/2014  . 3-vessel CAD 01/10/2014  . Polypharmacy 06/29/2013  . Other long term (current) drug therapy 06/29/2013  . Arthritis or polyarthritis, rheumatoid ([place]) 06/22/2013  . Acid reflux 05/05/2012  . Adult hypothyroidism 01/07/2012  . Flu vaccine need 01/07/2012  . Cough 12/14/2011  . CD (contact dermatitis) 10/15/2011  . Finger wound, simple, open 10/15/2011    Rosalyn Gess OTR/L,CLT  08/14/2015, 11:01 AM  [place] [facility] [address], [place], [address] Phone: [phone]   Fax:  [phone]  Name: ERENE FEENSTRA MRN: [id number] Date of Birth: 04/09/1945

## 2015-08-21 ENCOUNTER — Ambulatory Visit: Payer: Medicare Other | Admitting: Occupational Therapy

## 2015-08-21 DIAGNOSIS — R278 Other lack of coordination: Secondary | ICD-10-CM

## 2015-08-21 DIAGNOSIS — M79641 Pain in right hand: Secondary | ICD-10-CM | POA: Diagnosis not present

## 2015-08-21 DIAGNOSIS — M25541 Pain in joints of right hand: Secondary | ICD-10-CM

## 2015-08-21 NOTE — Therapy (Signed)
[place] [facility] [address], [place], [address] Phone: [phone]   Fax:  [phone]  Occupational Therapy Treatment  Patient Details  Name: Stephanie [name] MRN: [id number] Date of Birth: [birth year] Referring Provider: Dr Sammuel Bailiff  Encounter Date: 08/21/2015      OT End of Session - 08/21/15 1011    Visit Number 3   Number of Visits 4   Date for OT Re-Evaluation 09/17/15   Authorization Type UHC Medicare needs G Code every 10 visits   OT Start Time 1001   OT Stop Time 1045   OT Time Calculation (min) 44 min   Activity Tolerance Patient tolerated treatment well   Behavior During Therapy [facility] for tasks assessed/performed      Past Medical History  Diagnosis Date  . Anxiety   . Bipolar disorder ([place])   . Fibromyalgia   . Hypothyroidism   . Heart failure ([place])   . Sleep apnea   . Fatigue   . Vaginal Pap smear, abnormal   . Heart disease   . HLD (hyperlipidemia)   . Raynaud phenomenon   . Bipolar 1 disorder ([place])   . Depression   . GERD (gastroesophageal reflux disease)   . RA (rheumatoid arthritis) ([place])   . Perianal lesion     high grade sil lesion- keratinizing type  . Pelvic pain in female     Past Surgical History  Procedure Laterality Date  . Tonsillectomy      There were no vitals filed for this visit.      Subjective Assessment - 08/21/15 1001    Subjective  I am more fatigue, but did start the parafin bath- but I did keep up with my arthritis -  Ido have some questions about your notes   Patient Stated Goals Need to get my hand pain better again so I can use it better and do home program at home    Currently in Pain? Yes   Pain Score 6    Pain Location Hand   Pain Orientation Right;Left   Pain Descriptors / Indicators Aching;Sore   Pain Type Chronic pain   Pain Onset 1 to 4 weeks ago                      OT Treatments/Exercises (OP) - 08/21/15 0001    RUE Paraffin   Number Minutes Paraffin 10 Minutes   RUE Paraffin Location Hand   Comments At [facility] to decrease pain and increase ROM       After parafin  Reviewed during parafin and afterwards with pt  joint protection - hand out  Pt had questions on use of sponge and cloth in kitchen, pizza cutter, cutter board with nails,  Using cart at home for transport, enlarge kitchen utencils, levers for doorknobs,  Carrying bags   Reviewed HEP for tendon glides- pt needed mod A and max v/c for  block intrinsic fist during tendon glides  (Stop when feeling pull ) Opposition  - to do pad on pad And RD of digits walking with palm on table - pt compensate with flexion - use little pieces of paper under each   8 reps  2x day after parafin at home              OT Education - 08/21/15 1011    Education provided Yes   Education Details HEP and joint protection   Person(s) Educated Patient  Methods Explanation;Demonstration;Tactile cues;Verbal cues   Comprehension Verbalized understanding;Returned demonstration;Verbal cues required          OT Short Term Goals - 08/06/15 1254    OT SHORT TERM GOAL #1   Title Pt wil lbe Mod I AROM/HEP ex's right hand   Time 3   Period Weeks   Status New   OT SHORT TERM GOAL #2   Title Pt will be Mod I stating 1-2 joint protection techniques that she could perform at home while doing ADL's to minimize stress and pain on joints   Time 3   Period Weeks   Status New           OT Long Term Goals - 08/06/15 1256    OT LONG TERM GOAL #1   Title Pt will be Mod I joint protection techniques for use during ADL's   Time 6   Period Weeks   Status New   OT LONG TERM GOAL #2   Title Pt will be mod I stating energy conservation techniques and implement during simulated ADL   Time 6   Period Weeks   Status New   OT LONG TERM GOAL #3   Title Pt will report decreased pain during functional hand use as seen by pain rating as 3/10 or less   Time 6    Period Weeks   Status New   OT LONG TERM GOAL #4   Title Pt will show improved Quick DASH rating as 50% or less   Time 6   Period Weeks   Status New   OT LONG TERM GOAL #5   Title Pt will be Mod I upgraded home program related to RA   Time 6   Period Weeks   Status New               Plan - 08/21/15 1012    Clinical Impression Statement Pt making progress in pain but needed some assistance with HEP - mod A and max v/c for intirinsic fist and RD of digits- as well as education and answer some questions on Jiont protection and AE - pt to return in week    Rehab Potential Good   OT Frequency 1x / week   OT Duration 2 weeks   OT Treatment/Interventions Self-care/ADL training;Therapeutic exercise;Functional Mobility Training;Patient/family education;Splinting;Manual Therapy;Ultrasound;Energy conservation;Therapeutic exercises;Therapeutic activities;DME and/or AE instruction;Parrafin;Fluidtherapy;Moist Heat   Plan assess how doing wth HEP and joint protection - assess AROM at PIP's R hand    OT Home Exercise Plan See pt instruction   Consulted and Agree with Plan of Care Patient      Patient will benefit from skilled therapeutic intervention in order to improve the following deficits and impairments:  Decreased coordination, Decreased range of motion, Increased edema, Decreased activity tolerance, Pain, Impaired UE functional use, Decreased knowledge of use of DME, Decreased strength, Impaired perceived functional ability  Visit Diagnosis: Hand pain, right  Pain in joint of right hand  Other lack of coordination    Problem List Patient Active Problem List   Diagnosis Date Noted  . Chest pain 05/17/2015  . OP (osteoporosis) 02/25/2015  . Sjogren's syndrome ([place]) 02/20/2015  . Combined fat and carbohydrate induced hyperlipemia 10/03/2014  . Breathlessness on exertion 09/24/2014  . Bipolar 1 disorder with moderate mania ([place]) 08/29/2014  . Affective bipolar disorder  ([place]) 08/29/2014  . H/O gastrointestinal disease 08/29/2014  . Anxiety, generalized 08/29/2014  . Fibromyalgia 08/29/2014  . H/O elevated lipids 08/29/2014  . H/O:  hypothyroidism 08/29/2014  . Insomnia, persistent 08/29/2014  . 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 Chronic pain associated with significant psychosocial dysfunction 08/29/2014  . 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 Rheumatoid arthritis ([place]) 08/29/2014  . Benign essential HTN 01/10/2014  . 3-vessel CAD 01/10/2014  . Polypharmacy 06/29/2013  . Other long term (current) drug therapy 06/29/2013  . Arthritis or polyarthritis, rheumatoid ([place]) 06/22/2013  . Acid reflux 05/05/2012  . Adult hypothyroidism 01/07/2012  . Flu vaccine need 01/07/2012  . Cough 12/14/2011  . CD (contact dermatitis) 10/15/2011  . Finger wound, simple, open 10/15/2011    Rosalyn Gess OTR/L,CLT  08/21/2015, 11:37 AM  [place] [facility] [address] [place], [address] Phone: [phone]   Fax:  [phone]  Name: Stephanie [name] MRN: [id number] Date of Birth: 18-Apr-1945

## 2015-08-21 NOTE — Patient Instructions (Addendum)
Same HEP  

## 2015-08-27 ENCOUNTER — Other Ambulatory Visit: Payer: Self-pay

## 2015-08-27 NOTE — Telephone Encounter (Signed)
received fax requesting refill on carbamazepine 100mg  chew 6 tablets by mouth at bedtime

## 2015-08-28 ENCOUNTER — Ambulatory Visit: Payer: Medicare Other | Admitting: Occupational Therapy

## 2015-08-28 DIAGNOSIS — R278 Other lack of coordination: Secondary | ICD-10-CM

## 2015-08-28 DIAGNOSIS — R600 Localized edema: Secondary | ICD-10-CM

## 2015-08-28 DIAGNOSIS — M79641 Pain in right hand: Secondary | ICD-10-CM | POA: Diagnosis not present

## 2015-08-28 DIAGNOSIS — M25541 Pain in joints of right hand: Secondary | ICD-10-CM

## 2015-08-28 MED ORDER — CARBAMAZEPINE 100 MG PO CHEW: CHEWABLE_TABLET | ORAL | Status: DC

## 2015-08-28 NOTE — Patient Instructions (Addendum)
Cont with same HEP  And joint protection /AE  And pool workout

## 2015-08-28 NOTE — Therapy (Signed)
[place] [facility] [address] [place], [address] Phone: [phone]   Fax:  [phone]  Occupational Therapy Treatment  Patient Details  Name: Stephanie [name] MRN: [id number] Date of Birth: December 19, 1945 Referring Provider: Dr Sammuel Bailiff  Encounter Date: 08/28/2015      OT End of Session - 08/28/15 1010    Visit Number 4   Number of Visits 5   Date for OT Re-Evaluation 09/11/15   OT Start Time 1002   OT Stop Time 1049   OT Time Calculation (min) 47 min   Activity Tolerance Patient tolerated treatment well   Behavior During Therapy [facility] for tasks assessed/performed      Past Medical History  Diagnosis Date  . Anxiety   . Bipolar disorder ([place])   . Fibromyalgia   . Hypothyroidism   . Heart failure ([place])   . Sleep apnea   . Fatigue   . Vaginal Pap smear, abnormal   . Heart disease   . HLD (hyperlipidemia)   . Raynaud phenomenon   . Bipolar 1 disorder ([place])   . Depression   . GERD (gastroesophageal reflux disease)   . RA (rheumatoid arthritis) ([place])   . Perianal lesion     high grade sil lesion- keratinizing type  . Pelvic pain in female     Past Surgical History  Procedure Laterality Date  . Tonsillectomy      There were no vitals filed for this visit.      Subjective Assessment - 08/28/15 1005    Subjective  My hands did good this past week - I could not do the paraffin - mine was to hot- but I think the exercises helping - and pay attention how  [place] and carry    Patient Stated Goals Need to get my hand pain better again so I can use it better and do home program at home    Currently in Pain? Yes   Pain Score 5    Pain Location Hand   Pain Orientation Right;Left   Pain Descriptors / Indicators Aching            [facility] OT Assessment - 08/28/15 0001    Strength   Right Hand Grip (lbs) 25   Right Hand Lateral Pinch 10 lbs   Right Hand 3 Point Pinch 8 lbs   Left Hand Grip (lbs)  27   Left Hand Lateral Pinch 11 lbs   Left Hand 3 Point Pinch 9 lbs                  OT Treatments/Exercises (OP) - 08/28/15 0001    RUE Paraffin   Number Minutes Paraffin 10 Minutes   RUE Paraffin Location Hand   Comments At [facility] to increaes PIP flexion and decreaes pain       Measurements taken - PIP 's improved from 75% to about 85% Mc's WNL -  Grip and prehension assess - see flowsheet    After parafin  Reviewed during parafin and afterwards with pt joint protection  And pt had questions on working out in pool the last 3 days  Feeling better reviewed again  hand out  Pt had questions on use of  pizza cutter, cooking with pans in oven, ed on holding with 90 degrees grip with knuckles to decrease UD of MC's   Using cart at home for transport, enlarge kitchen utencils, levers for doorknobs, Carrying bags- using paper bags  Reviewed HEP for tendon glides- pt needed min A and min v/c  for  block intrinsic fist during tendon glides  (Stop when feeling pull ) Opposition  - to do pad on pad - need min v/c for making oval And RD of digits walking with palm on table - pt compensate with flexion - use little pieces of paper under 3rd - had decrease pain  8 reps             OT Education - 08/28/15 1009    Education provided Yes   Education Details HEP reviewed    Person(s) Educated Patient   Methods Explanation;Demonstration;Tactile cues;Verbal cues   Comprehension Returned demonstration;Verbalized understanding;Verbal cues required          OT Short Term Goals - 08/28/15 1412    OT SHORT TERM GOAL #1   Title Pt wil lbe Mod I AROM/HEP ex's right hand   Baseline min A   Time 2   Period Weeks   Status On-going   OT SHORT TERM GOAL #2   Title Pt will be Mod I stating 1-2 joint protection techniques that she could perform at home while doing ADL's to minimize stress and pain on joints   Status Achieved           OT Long Term Goals - 08/28/15  1413    OT LONG TERM GOAL #1   Title Pt will be Mod I joint protection techniques for use during ADL's   Status Achieved   OT LONG TERM GOAL #2   Title Pt will be mod I stating energy conservation techniques and implement during simulated ADL   Status Achieved   OT LONG TERM GOAL #3   Title Pt will report decreased pain during functional hand use as seen by pain rating as 3/10 or less   Status Achieved   OT LONG TERM GOAL #4   Title Pt will show improved Quick DASH rating as 50% or less   Status Deferred   OT LONG TERM GOAL #5   Title Pt will be Mod I upgraded home program related to RA   Status Achieved               Plan - 08/28/15 1409    Clinical Impression Statement Pt showed some progress in PIP's flexion - MC's WNL - pt showed grip  that is abve 25lbs , and prhension that is [facility] - pt ed on joint protection principles and AE - and very motivated to make changes - report decrease pain  the last week  - pt to cont with homeprogram and to phone me if need to see me in the next 2 wks - other wise will discharge her    Rehab Potential Good   OT Frequency 1x / week   OT Duration 2 weeks   OT Treatment/Interventions Self-care/ADL training;Therapeutic exercise;Functional Mobility Training;Patient/family education;Splinting;Manual Therapy;Ultrasound;Energy conservation;Therapeutic exercises;Therapeutic activities;DME and/or AE instruction;Parrafin;Fluidtherapy;Moist Heat   Plan phone in next 2 wks if need to be seen one more time - cnt with home program   OT Home Exercise Plan See pt instruction   Consulted and Agree with Plan of Care Patient      Patient will benefit from skilled therapeutic intervention in order to improve the following deficits and impairments:  Decreased coordination, Decreased range of motion, Increased edema, Decreased activity tolerance, Pain, Impaired UE functional use, Decreased knowledge of use of DME, Decreased strength, Impaired perceived functional  ability  Visit Diagnosis: Hand  pain, right  Pain in joint of right hand  Other lack of coordination  Hand edema    Problem List Patient Active Problem List   Diagnosis Date Noted  . Chest pain 05/17/2015  . OP (osteoporosis) 02/25/2015  . Sjogren's syndrome ([place]) 02/20/2015  . Combined fat and carbohydrate induced hyperlipemia 10/03/2014  . Breathlessness on exertion 09/24/2014  . Bipolar 1 disorder with moderate mania ([place]) 08/29/2014  . Affective bipolar disorder ([place]) 08/29/2014  . H/O gastrointestinal disease 08/29/2014  . Anxiety, generalized 08/29/2014  . Fibromyalgia 08/29/2014  . H/O elevated lipids 08/29/2014  . H/O: hypothyroidism 08/29/2014  . Insomnia, persistent 08/29/2014  . 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 Chronic pain associated with significant psychosocial dysfunction 08/29/2014  . 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 Rheumatoid arthritis ([place]) 08/29/2014  . Benign essential HTN 01/10/2014  . 3-vessel CAD 01/10/2014  . Polypharmacy 06/29/2013  . Other long term (current) drug therapy 06/29/2013  . Arthritis or polyarthritis, rheumatoid ([place]) 06/22/2013  . Acid reflux 05/05/2012  . Adult hypothyroidism 01/07/2012  . Flu vaccine need 01/07/2012  . Cough 12/14/2011  . CD (contact dermatitis) 10/15/2011  . Finger wound, simple, open 10/15/2011    Rosalyn Gess OTR/L,CLT  08/28/2015, 2:14 PM  [place] [facility] [address], [place], [address] Phone: [phone]   Fax:  [phone]  Name: CORTNY SWANIGAN MRN: [id number] Date of Birth: 1946/01/25

## 2015-09-03 NOTE — Telephone Encounter (Signed)
This issue was addressed and medication changed.

## 2015-09-25 ENCOUNTER — Other Ambulatory Visit: Payer: Self-pay

## 2015-09-25 MED ORDER — GABAPENTIN 300 MG PO CAPS: 300.0000 mg | ORAL_CAPSULE | Freq: Two times a day (BID) | ORAL | Status: DC

## 2015-09-25 NOTE — Telephone Encounter (Signed)
rx faxed and confirmed gabapentin 300mg  id # [id number] order # [id number]

## 2015-09-25 NOTE — Telephone Encounter (Signed)
request for a refill on gabapentin 300mg  this is a dr. Einar Grad patient she has appt on  11-06-15

## 2015-10-01 ENCOUNTER — Other Ambulatory Visit: Payer: Self-pay

## 2015-10-01 NOTE — Telephone Encounter (Signed)
pt needs a 90 day supply of seroquel. pt is a dr. Einar Grad patient has an appt for end of next month.

## 2015-10-02 ENCOUNTER — Other Ambulatory Visit: Payer: Self-pay

## 2015-10-02 NOTE — Telephone Encounter (Signed)
pt called insurance nept called insurance need 90 day supply of seroquel .  Pt has appt with dr. Einar Grad  On  11-06-15.  Dr. Einar Grad patient.

## 2015-10-03 MED ORDER — QUETIAPINE FUMARATE 50 MG PO TABS: 50.0000 mg | ORAL_TABLET | Freq: Two times a day (BID) | ORAL | 0 refills | Status: DC

## 2015-10-07 ENCOUNTER — Emergency Department: Payer: Medicare Other

## 2015-10-07 ENCOUNTER — Emergency Department
Admission: EM | Admit: 2015-10-07 | Discharge: 2015-10-07 | Disposition: A | Discharge status: Home / Self Care | Payer: Medicare Other | Attending: Emergency Medicine | Admitting: Emergency Medicine

## 2015-10-07 DIAGNOSIS — Z79899 Other long term (current) drug therapy: Secondary | ICD-10-CM | POA: Diagnosis not present

## 2015-10-07 DIAGNOSIS — I110 Hypertensive heart disease with heart failure: Secondary | ICD-10-CM | POA: Diagnosis not present

## 2015-10-07 DIAGNOSIS — I509 Heart failure, unspecified: Secondary | ICD-10-CM | POA: Insufficient documentation

## 2015-10-07 DIAGNOSIS — E039 Hypothyroidism, unspecified: Secondary | ICD-10-CM | POA: Diagnosis not present

## 2015-10-07 DIAGNOSIS — Z87891 Personal history of nicotine dependence: Secondary | ICD-10-CM | POA: Insufficient documentation

## 2015-10-07 DIAGNOSIS — W1800XA Striking against unspecified object with subsequent fall, initial encounter: Secondary | ICD-10-CM | POA: Insufficient documentation

## 2015-10-07 DIAGNOSIS — W19XXXA Unspecified fall, initial encounter: Secondary | ICD-10-CM

## 2015-10-07 DIAGNOSIS — Y999 Unspecified external cause status: Secondary | ICD-10-CM | POA: Diagnosis not present

## 2015-10-07 DIAGNOSIS — S0990XA Unspecified injury of head, initial encounter: Secondary | ICD-10-CM | POA: Diagnosis not present

## 2015-10-07 DIAGNOSIS — Y939 Activity, unspecified: Secondary | ICD-10-CM | POA: Insufficient documentation

## 2015-10-07 DIAGNOSIS — E785 Hyperlipidemia, unspecified: Secondary | ICD-10-CM | POA: Insufficient documentation

## 2015-10-07 DIAGNOSIS — Y92009 Unspecified place in unspecified non-institutional (private) residence as the place of occurrence of the external cause: Secondary | ICD-10-CM | POA: Diagnosis not present

## 2015-10-07 DIAGNOSIS — Z7982 Long term (current) use of aspirin: Secondary | ICD-10-CM | POA: Insufficient documentation

## 2015-10-07 LAB — CBC
HCT: 44.1 % (ref 35.0–47.0)
Hemoglobin: 15.2 g/dL (ref 12.0–16.0)
MCH: 35.4 pg — AB (ref 26.0–34.0)
MCHC: 34.4 g/dL (ref 32.0–36.0)
MCV: 102.9 fL — ABNORMAL HIGH (ref 80.0–100.0)
PLATELETS: 114 10*3/uL — AB (ref 150–440)
RBC: 4.28 MIL/uL (ref 3.80–5.20)
RDW: 14.2 % (ref 11.5–14.5)
WBC: 6 10*3/uL (ref 3.6–11.0)

## 2015-10-07 LAB — COMPREHENSIVE METABOLIC PANEL
ALBUMIN: 3.9 g/dL (ref 3.5–5.0)
ALK PHOS: 50 U/L (ref 38–126)
ALT: 15 U/L (ref 14–54)
ANION GAP: 7 (ref 5–15)
AST: 29 U/L (ref 15–41)
BUN: 17 mg/dL (ref 6–20)
CALCIUM: 9 mg/dL (ref 8.9–10.3)
CHLORIDE: 104 mmol/L (ref 101–111)
CO2: 27 mmol/L (ref 22–32)
Creatinine, Ser: 0.52 mg/dL (ref 0.44–1.00)
GFR calc Af Amer: >60 mL/min (ref >60)
GFR calc non Af Amer: >60 mL/min (ref >60)
GLUCOSE: 89 mg/dL (ref 65–99)
Potassium: 4.4 mmol/L (ref 3.5–5.1)
SODIUM: 138 mmol/L (ref 135–145)
Total Bilirubin: 0.4 mg/dL (ref 0.3–1.2)
Total Protein: 6.4 g/dL — ABNORMAL LOW (ref 6.5–8.1)

## 2015-10-07 LAB — ETHANOL: Alcohol, Ethyl (B): <5 mg/dL (ref <5)

## 2015-10-07 LAB — DIFFERENTIAL
Basophils Absolute: 0 10*3/uL (ref 0–0.1)
Basophils Relative: 1 %
EOS PCT: 3 %
Eosinophils Absolute: 0.2 10*3/uL (ref 0–0.7)
LYMPHS PCT: 42 %
Lymphs Abs: 2.5 10*3/uL (ref 1.0–3.6)
Monocytes Absolute: 0.5 10*3/uL (ref 0.2–0.9)
Monocytes Relative: 9 %
NEUTROS ABS: 2.7 10*3/uL (ref 1.4–6.5)
NEUTROS PCT: 45 %

## 2015-10-07 LAB — PROTIME-INR
INR: 0.93
PROTHROMBIN TIME: 12.5 s (ref 11.4–15.2)

## 2015-10-07 LAB — APTT: aPTT: 25 seconds (ref 24–36)

## 2015-10-07 LAB — GLUCOSE, CAPILLARY: GLUCOSE-CAPILLARY: 95 mg/dL (ref 65–99)

## 2015-10-07 LAB — TROPONIN I: Troponin I: <0.03 ng/mL (ref <0.03)

## 2015-10-07 MED ORDER — ONDANSETRON 4 MG PO TBDP
4.0000 mg | ORAL_TABLET | Freq: Once | ORAL | Status: AC
  Administered 2015-10-07: 4 mg via ORAL
  Filled 2015-10-07: qty 1

## 2015-10-07 NOTE — ED Triage Notes (Signed)
Pt brought directly to ed room 4 from CT. Arrived to ed via private vehicle with c/o a fall this am around 6am at home and now c/o  Headache, nausea and blurred vision.  Pt reports blurred vision is getting better. No loc at time of fall.  Pt took aleve around 9am.

## 2015-10-07 NOTE — ED Notes (Signed)
Awaiting CT results.

## 2015-10-07 NOTE — ED Notes (Signed)
Assisted patient to the restroom and put a sample on the counter because I did not see an order at this time.

## 2015-10-07 NOTE — ED Provider Notes (Signed)
[facility] Emergency Department Provider Note   ____________________________________________   First MD Initiated Contact with Patient 10/07/15 0920     (approximate)  I have reviewed the triage vital signs and the nursing notes.   HISTORY  Chief Complaint Fall; Migraine; and Blurred Vision    HPI Stephanie [name] is a 70 y.o. female presents after falling. Patient reports that she tripped while getting up this morning at about 6 AM, she fell backwards and struck the back of her head. She reports she is having a mild headache and soreness over the back right scalp. She's had some mild nausea as well. She called her friend, and called her primary care doctor who recommended she come to the hospital.  She reports her pain and headache have gone away after taking Aleve at home. Her nausea is very minimal, and she reports she has nausea almost every day anyway. She denies any vision changes other then felt a little blurry for a few minutes after falling. No neck pain.  No fevers chills or recent illness. No chest pain or trouble breathing. Reports otherwise feeling well. She is concerned she may have a "concussion"   Past Medical History:  Diagnosis Date  . Anxiety   . Bipolar 1 disorder ([place])   . Bipolar disorder ([place])   . Depression   . Fatigue   . Fibromyalgia   . GERD (gastroesophageal reflux disease)   . Heart disease   . Heart failure ([place])   . HLD (hyperlipidemia)   . Hypothyroidism   . Pelvic pain in female   . Perianal lesion    high grade sil lesion- keratinizing type  . RA (rheumatoid arthritis) ([place])   . Raynaud phenomenon   . Sleep apnea   . Vaginal Pap smear, abnormal     Patient Active Problem List   Diagnosis Date Noted  . Chest pain 05/17/2015  . OP (osteoporosis) 02/25/2015  . Sjogren's syndrome ([place]) 02/20/2015  . Combined fat and carbohydrate induced hyperlipemia 10/03/2014  . Breathlessness on exertion 09/24/2014  .  Bipolar 1 disorder with moderate mania ([place]) 08/29/2014  . Affective bipolar disorder ([place]) 08/29/2014  . H/O gastrointestinal disease 08/29/2014  . Anxiety, generalized 08/29/2014  . Fibromyalgia 08/29/2014  . H/O elevated lipids 08/29/2014  . H/O: hypothyroidism 08/29/2014  . Insomnia, persistent 08/29/2014  . 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 Chronic pain associated with significant psychosocial dysfunction 08/29/2014  . Gougerout-Sjoegren syndrome ([place]) 08/29/2014  . 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 Rheumatoid arthritis ([place]) 08/29/2014  . Benign essential HTN 01/10/2014  . 3-vessel CAD 01/10/2014  . Polypharmacy 06/29/2013  . Other long term (current) drug therapy 06/29/2013  . Arthritis or polyarthritis, rheumatoid ([place]) 06/22/2013  . Acid reflux 05/05/2012  . Adult hypothyroidism 01/07/2012  . Flu vaccine need 01/07/2012  . Cough 12/14/2011  . CD (contact dermatitis) 10/15/2011  . Finger wound, simple, open 10/15/2011    Past Surgical History:  Procedure Laterality Date  . TONSILLECTOMY      Prior to Admission medications   Medication Sig Start Date End Date Taking? Authorizing Provider  aspirin 81 MG tablet Take 81 mg by mouth daily.    Yes Historical Provider, MD  Calcium Citrate-Vitamin D ([employer] CALCIUM CITRATE +D3 MINI) 200-250 MG-UNIT TABS Take 1 tablet by mouth daily.    Yes Historical Provider, MD  carbamazepine (TEGRETOL) 100 MG chewable tablet Take 6 chewable tablets by mouth at bedtime 08/28/15  Yes Rainey Pines, MD  divalproex (DEPAKOTE ER) 500 MG 24 hr tablet Take 1 tablet (500 mg total) by mouth 2 (two) times daily. 08/07/15  Yes Himabindu Ravi, MD  folic acid (FOLVITE) 1 MG tablet Take 1 mg by mouth daily.  08/29/14  Yes Historical Provider, MD  gabapentin (NEURONTIN) 300 MG capsule Take 1 capsule (300 mg total) by mouth 2 (two) times daily. 09/25/15  Yes Rainey Pines, MD  levothyroxine (SYNTHROID) 75 MCG tablet Take 75 mcg by mouth daily before  breakfast.    Yes Historical Provider, MD  nitroGLYCERIN (NITROSTAT) 0.4 MG SL tablet Place 0.4 mg under the tongue every 5 (five) minutes as needed.  10/23/11  Yes Historical Provider, MD  pravastatin (PRAVACHOL) 10 MG tablet Take 10 mg by mouth daily.  06/03/15  Yes Historical Provider, MD  QUEtiapine (SEROQUEL) 50 MG tablet Take 1 tablet (50 mg total) by mouth 2 (two) times daily. 10/03/15  Yes Rainey Pines, MD  ranitidine (ZANTAC) 150 MG tablet Take 150 mg by mouth 2 (two) times daily.    Yes Historical Provider, MD  methotrexate 2.5 MG tablet Take 2.5 mg by mouth daily.     Historical Provider, MD    Allergies Amoxicillin-pot clavulanate; Oxycodone-acetaminophen; Erythromycin; Azithromycin; Codeine; Lamotrigine; Macrolides and ketolides; Oxycodone-acetaminophen; Penicillins; Sulfa antibiotics; and Tramadol  Family History  Problem Relation Age of Onset  . Heart failure Father   . Parkinson's disease Mother   . Bipolar disorder Mother   . Depression Sister   . Schizophrenia Paternal Grandmother   . Diabetes Maternal Grandmother   . Pancreatic cancer Maternal Grandmother   . Cancer Neg Hx   . Heart disease Neg Hx     Social History Social History  Substance Use Topics  . Smoking status: Never Smoker  . Smokeless tobacco: Former Systems developer  . Alcohol use No    Review of Systems Constitutional: No fever/chills Eyes: See history of present illness, reports vision is normal now ENT: No sore throat. Cardiovascular: Denies chest pain. Respiratory: Denies shortness of breath. Gastrointestinal: No abdominal pain.  No vomiting.  No diarrhea.  No constipation. Genitourinary: Negative for dysuria. Musculoskeletal: Negative for back pain. Skin: Negative for rash. Neurological: Negative for focal weakness or numbness.  10-point ROS otherwise negative.  ____________________________________________   PHYSICAL EXAM:  VITAL SIGNS: ED Triage Vitals  Enc Vitals Group     BP 10/07/15 1015  (!) 146/124     Pulse Rate 10/07/15 1015 68     Resp 10/07/15 1015 14     Temp 10/07/15 1015 97.9 F (36.6 C)     Temp Source 10/07/15 1015 Oral     SpO2 10/07/15 1015 99 %     Weight 10/07/15 1020 107 lb (48.5 kg)     Height 10/07/15 1020 5' (1.524 m)     Head Circumference --      Peak Flow --      Pain Score --      Pain Loc --      Pain Edu? --      Excl. in [place]? --     Constitutional: Alert and oriented. Well appearing and in no acute distress. Eyes: Conjunctivae are normal. PERRL. EOMI. Head: Atraumatic. Nose: No congestion/rhinnorhea. Mouth/Throat: Mucous membranes are moist.  Oropharynx non-erythematous. Neck: No stridor.  No midline cervical tenderness. Cardiovascular: Normal rate, regular rhythm. Grossly normal heart sounds.  Good peripheral circulation. Respiratory: Normal respiratory effort.  No retractions. Lungs CTAB. Gastrointestinal: Soft and nontender. No distention. No abdominal bruits. No CVA  tenderness. Musculoskeletal: No lower extremity tenderness nor edema.   Neurologic:  Normal speech and language. No gross focal neurologic deficits are appreciated.   NIH score equals 0, performed by me at bedside. The patient has no pronator drift. The patient has normal cranial nerve exam. Extraocular movements are normal. Visual fields are normal. Patient has 5 out of 5 strength in all extremities. There is no numbness or gross, acute sensory abnormality in the extremities bilaterally. No speech disturbance. No dysarthria. No aphasia. No ataxia. Normal finger nose finger bilat. Patient speaking in full and clear sentences.   Skin:  Skin is warm, dry and intact. No rash noted. Psychiatric: Mood and affect are normal. Speech and behavior are normal.  ____________________________________________   LABS (all labs ordered are listed, but only abnormal results are displayed)  Labs Reviewed  CBC - Abnormal; Notable for the following:       Result Value   MCV  102.9 (*)    MCH 35.4 (*)    Platelets 114 (*)    All other components within normal limits  COMPREHENSIVE METABOLIC PANEL - Abnormal; Notable for the following:    Total Protein 6.4 (*)    All other components within normal limits  GLUCOSE, CAPILLARY  ETHANOL  PROTIME-INR  APTT  DIFFERENTIAL  TROPONIN I  CBG MONITORING, ED   ____________________________________________  Suanne Marker and interpreted by me at 10:15 AM  Normal sinus rhythm, heart rate 70 PR 180 QTc 450 Normal-appearing 12-lea except minimal incomplete right bundle-branch block ____________________________________________  RADIOLOGY  Ct Head Code Stroke W/o Cm  Result Date: 10/07/2015 CLINICAL DATA:  Pain following fall.  Blurred vision and headache EXAM: CT HEAD WITHOUT CONTRAST TECHNIQUE: Contiguous axial images were obtained from the base of the skull through the vertex without intravenous contrast. COMPARISON:  May 28, 2014 FINDINGS: There is mild diffuse atrophy, stable. There is no intracranial mass, hemorrhage, extra-axial fluid collection, or midline shift. There is stable slight small vessel disease in the centra semiovale bilaterally. No acute infarct evident. No new gray-white compartment lesion. There is no hyperdense vessel or appreciable vascular calcification. The bony calvarium appears intact. The mastoid air cells are clear. There is a right parietal scalp hematoma. The visualized paranasal sinuses are clear. Visualized orbits are symmetric bilaterally. IMPRESSION: Stable mild atrophy with mild periventricular small vessel disease. No intracranial mass, hemorrhage, or extra-axial fluid collection. No acute infarct evident. There is a right parietal scalp hematoma. No bony fracture evident. Critical Value/emergent results were called by telephone at the time of interpretation on 10/07/2015 at 10:19 am to Dr. Harvest Dark , who verbally acknowledged these results. Electronically Signed   By: Lowella Grip III M.D.   On: 10/07/2015 10:20   ____________________________________________   PROCEDURES  Procedure(s) performed: None  Procedures  Critical Care performed: No  ____________________________________________   INITIAL IMPRESSION / ASSESSMENT AND PLAN / ED COURSE  Pertinent labs & imaging results that were available during my care of the patient were reviewed by me and considered in my medical decision making (see chart for details).  Patient presents for evaluation after falling backwards and striking her head. Very reassuring clinical exam, Nexus negative. Based on the patient's age and ongoing nausea with a mild headache reported we'll obtain a CT to further evaluate. The patient was initially seen as a possible code stroke, however I do not find any evidence to support this diagnosis, rather sounds more like she fell and struck the back of her head. She  denies any cardiac or pulmonary symptoms. She appears very stable and in no distress.  At the time of discharge, patient reports she feels fine. No complaints. She'll follow-up closely with Dr. Loney Hering. Return precautions and treatment recommendations and follow-up discussed with the patient who is agreeable with the plan.   Clinical Course     ____________________________________________   FINAL CLINICAL IMPRESSION(S) / ED DIAGNOSES  Final diagnoses:  Fall, initial encounter  Minor head injury, initial encounter      NEW MEDICATIONS STARTED DURING THIS VISIT:  New Prescriptions   No medications on file     Note:  This document was prepared using Dragon voice recognition software and may include unintentional dictation errors.     Delman Kitten, MD 10/07/15 1320

## 2015-10-07 NOTE — ED Notes (Signed)
Pt assisted to the bathroom w/o incident and placed back into her bed. Pt had a chief complaint of she has been here since 9:30 am and has yet to see a doctor.

## 2015-10-07 NOTE — Discharge Instructions (Signed)

## 2015-10-18 ENCOUNTER — Encounter: Payer: Self-pay | Admitting: Emergency Medicine

## 2015-10-18 ENCOUNTER — Emergency Department
Admission: EM | Admit: 2015-10-18 | Discharge: 2015-10-18 | Discharge status: Against Medical Advice | Payer: Medicare Other | Attending: Student | Admitting: Student

## 2015-10-18 ENCOUNTER — Emergency Department: Payer: Medicare Other

## 2015-10-18 DIAGNOSIS — Z7982 Long term (current) use of aspirin: Secondary | ICD-10-CM | POA: Insufficient documentation

## 2015-10-18 DIAGNOSIS — I110 Hypertensive heart disease with heart failure: Secondary | ICD-10-CM | POA: Insufficient documentation

## 2015-10-18 DIAGNOSIS — I509 Heart failure, unspecified: Secondary | ICD-10-CM | POA: Diagnosis not present

## 2015-10-18 DIAGNOSIS — Z87891 Personal history of nicotine dependence: Secondary | ICD-10-CM | POA: Diagnosis not present

## 2015-10-18 DIAGNOSIS — Z79899 Other long term (current) drug therapy: Secondary | ICD-10-CM | POA: Diagnosis not present

## 2015-10-18 DIAGNOSIS — R0789 Other chest pain: Secondary | ICD-10-CM | POA: Diagnosis present

## 2015-10-18 DIAGNOSIS — E039 Hypothyroidism, unspecified: Secondary | ICD-10-CM | POA: Insufficient documentation

## 2015-10-18 DIAGNOSIS — R079 Chest pain, unspecified: Secondary | ICD-10-CM

## 2015-10-18 LAB — CBC WITH DIFFERENTIAL/PLATELET
Basophils Absolute: 0 10*3/uL (ref 0–0.1)
Basophils Relative: 1 %
EOS ABS: 0.1 10*3/uL (ref 0–0.7)
Eosinophils Relative: 2 %
HCT: 40.6 % (ref 35.0–47.0)
Hemoglobin: 14.3 g/dL (ref 12.0–16.0)
Lymphocytes Relative: 44 %
Lymphs Abs: 2.6 10*3/uL (ref 1.0–3.6)
MCH: 35.9 pg — AB (ref 26.0–34.0)
MCHC: 35.3 g/dL (ref 32.0–36.0)
MCV: 101.6 fL — AB (ref 80.0–100.0)
MONO ABS: 0.4 10*3/uL (ref 0.2–0.9)
MONOS PCT: 6 %
Neutro Abs: 2.8 10*3/uL (ref 1.4–6.5)
Neutrophils Relative %: 47 %
PLATELETS: 111 10*3/uL — AB (ref 150–440)
RBC: 4 MIL/uL (ref 3.80–5.20)
RDW: 14.1 % (ref 11.5–14.5)
WBC: 5.9 10*3/uL (ref 3.6–11.0)

## 2015-10-18 LAB — COMPREHENSIVE METABOLIC PANEL
ALK PHOS: 45 U/L (ref 38–126)
ALT: 14 U/L (ref 14–54)
ANION GAP: 6 (ref 5–15)
AST: 28 U/L (ref 15–41)
Albumin: 3.7 g/dL (ref 3.5–5.0)
BUN: 19 mg/dL (ref 6–20)
CALCIUM: 9.1 mg/dL (ref 8.9–10.3)
CHLORIDE: 103 mmol/L (ref 101–111)
CO2: 29 mmol/L (ref 22–32)
CREATININE: 0.49 mg/dL (ref 0.44–1.00)
Glucose, Bld: 134 mg/dL — ABNORMAL HIGH (ref 65–99)
Potassium: 3.9 mmol/L (ref 3.5–5.1)
Sodium: 138 mmol/L (ref 135–145)
Total Bilirubin: 0.8 mg/dL (ref 0.3–1.2)
Total Protein: 6.2 g/dL — ABNORMAL LOW (ref 6.5–8.1)

## 2015-10-18 LAB — TROPONIN I

## 2015-10-18 MED ORDER — ASPIRIN 81 MG PO CHEW
324.0000 mg | CHEWABLE_TABLET | Freq: Once | ORAL | Status: AC
  Administered 2015-10-18: 324 mg via ORAL
  Filled 2015-10-18: qty 4

## 2015-10-18 NOTE — ED Notes (Signed)
Patient returned from radiology

## 2015-10-18 NOTE — ED Provider Notes (Signed)
[facility] Emergency Department Provider Note   ____________________________________________   First MD Initiated Contact with Patient 10/18/15 1428     (approximate)  I have reviewed the triage vital signs and the nursing notes.   HISTORY  Chief Complaint Chest Pain    HPI Stephanie [name] is a 70 y.o. female with history of known three-vessel coronary artery disease with a "60% blockage in an unknown vessel documented in 2009", medically managed, hyperlipidemia, hypertension, hypothyroidism, followed by Dr. Ubaldo Glassing who presents for evaluation of 3 episodes of substernal chest pressure at rest today, gradual onset, intermittent, severe, radiating into the left chest, currently resolved. Patient reports that this feels similar to her angina pain that she has had previously. She took a subungual nitroglycerin tablet which helped her symptoms, called her cardiologist's office and they recommended that she come to the emergency department. She denies any shortness of breath, no nausea, vomiting, diarrhea, fevers or chills.   Past Medical History:  Diagnosis Date  . Anxiety   . Bipolar 1 disorder ([place])   . Bipolar disorder ([place])   . Depression   . Fatigue   . Fibromyalgia   . GERD (gastroesophageal reflux disease)   . Heart disease   . Heart failure ([place])   . HLD (hyperlipidemia)   . Hypothyroidism   . Pelvic pain in female   . Perianal lesion    high grade sil lesion- keratinizing type  . RA (rheumatoid arthritis) ([place])   . Raynaud phenomenon   . Sleep apnea   . Vaginal Pap smear, abnormal     Patient Active Problem List   Diagnosis Date Noted  . Chest pain 05/17/2015  . OP (osteoporosis) 02/25/2015  . Sjogren's syndrome ([place]) 02/20/2015  . Combined fat and carbohydrate induced hyperlipemia 10/03/2014  . Breathlessness on exertion 09/24/2014  . Bipolar 1 disorder with moderate mania ([place]) 08/29/2014  . Affective bipolar disorder ([place]) 08/29/2014   . H/O gastrointestinal disease 08/29/2014  . Anxiety, generalized 08/29/2014  . Fibromyalgia 08/29/2014  . H/O elevated lipids 08/29/2014  . H/O: hypothyroidism 08/29/2014  . Insomnia, persistent 08/29/2014  . 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 Chronic pain associated with significant psychosocial dysfunction 08/29/2014  . Gougerout-Sjoegren syndrome ([place]) 08/29/2014  . 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 Rheumatoid arthritis ([place]) 08/29/2014  . Benign essential HTN 01/10/2014  . 3-vessel CAD 01/10/2014  . Polypharmacy 06/29/2013  . Other long term (current) drug therapy 06/29/2013  . Arthritis or polyarthritis, rheumatoid ([place]) 06/22/2013  . Acid reflux 05/05/2012  . Adult hypothyroidism 01/07/2012  . Flu vaccine need 01/07/2012  . Cough 12/14/2011  . CD (contact dermatitis) 10/15/2011  . Finger wound, simple, open 10/15/2011    Past Surgical History:  Procedure Laterality Date  . TONSILLECTOMY      Prior to Admission medications   Medication Sig Start Date End Date Taking? Authorizing Provider  aspirin 81 MG tablet Take 81 mg by mouth daily.     Historical Provider, MD  Calcium Citrate-Vitamin D (CVS CALCIUM CITRATE +D3 MINI) 200-250 MG-UNIT TABS Take 1 tablet by mouth daily.     Historical Provider, MD  carbamazepine (TEGRETOL) 100 MG chewable tablet Take 6 chewable tablets by mouth at bedtime 08/28/15   Rainey Pines, MD  divalproex (DEPAKOTE ER) 500 MG 24 hr tablet Take 1 tablet (500 mg total) by mouth 2 (two) times daily. 08/07/15   Himabindu Ravi, MD  folic acid (FOLVITE) 1 MG tablet Take 1 mg by mouth daily.  08/29/14   Historical Provider, MD  gabapentin (NEURONTIN) 300 MG capsule Take 1 capsule (300 mg total) by mouth 2 (two) times daily. 09/25/15   Rainey Pines, MD  levothyroxine (SYNTHROID) 75 MCG tablet Take 75 mcg by mouth daily before breakfast.     Historical Provider, MD  methotrexate 2.5 MG tablet Take 2.5 mg by mouth daily.     Historical Provider,  MD  nitroGLYCERIN (NITROSTAT) 0.4 MG SL tablet Place 0.4 mg under the tongue every 5 (five) minutes as needed.  10/23/11   Historical Provider, MD  pravastatin (PRAVACHOL) 10 MG tablet Take 10 mg by mouth daily.  06/03/15   Historical Provider, MD  QUEtiapine (SEROQUEL) 50 MG tablet Take 1 tablet (50 mg total) by mouth 2 (two) times daily. 10/03/15   Rainey Pines, MD  ranitidine (ZANTAC) 150 MG tablet Take 150 mg by mouth 2 (two) times daily.     Historical Provider, MD    Allergies Amoxicillin-pot clavulanate; Oxycodone-acetaminophen; Erythromycin; Azithromycin; Codeine; Lamotrigine; Macrolides and ketolides; Oxycodone-acetaminophen; Penicillins; Sulfa antibiotics; and Tramadol  Family History  Problem Relation Age of Onset  . Heart failure Father   . Parkinson's disease Mother   . Bipolar disorder Mother   . Depression Sister   . Schizophrenia Paternal Grandmother   . Diabetes Maternal Grandmother   . Pancreatic cancer Maternal Grandmother   . Cancer Neg Hx   . Heart disease Neg Hx     Social History Social History  Substance Use Topics  . Smoking status: Never Smoker  . Smokeless tobacco: Former Systems developer  . Alcohol use No    Review of Systems Constitutional: No fever/chills Eyes: No visual changes. ENT: No sore throat. Cardiovascular: + chest pain. Respiratory: Denies shortness of breath. Gastrointestinal: No abdominal pain.  No nausea, no vomiting.  No diarrhea.  No constipation. Genitourinary: Negative for dysuria. Musculoskeletal: Negative for back pain. Skin: Negative for rash. Neurological: Negative for headaches, focal weakness or numbness.  10-point ROS otherwise negative.  ____________________________________________   PHYSICAL EXAM:  VITAL SIGNS: ED Triage Vitals [10/18/15 1411]  Enc Vitals Group     BP 131/90     Pulse Rate 71     Resp 18     Temp 98.3 F (36.8 C)     Temp Source Oral     SpO2 100 %     Weight 107 lb (48.5 kg)     Height 5' (1.524 m)       Head Circumference      Peak Flow      Pain Score      Pain Loc      Pain Edu?      Excl. in [place]?     Constitutional: Alert and oriented. Well appearing and in no acute distress. Eyes: Conjunctivae are normal. PERRL. EOMI. Head: Atraumatic. Nose: No congestion/rhinnorhea. Mouth/Throat: Mucous membranes are moist.  Oropharynx non-erythematous. Neck: No stridor. Supple without meningismus. Cardiovascular: Normal rate, regular rhythm. Grossly normal heart sounds.  Good peripheral circulation. Respiratory: Normal respiratory effort.  No retractions. Lungs CTAB. Gastrointestinal: Soft and nontender. No distention. No CVA tenderness. Genitourinary: deferred Musculoskeletal: No lower extremity tenderness nor edema.  No joint effusions. Neurologic:  Normal speech and language. No gross focal neurologic deficits are appreciated. No gait instability. Skin:  Skin is warm, dry and intact. No rash noted. Psychiatric: Mood and affect are normal. Speech and behavior are normal.  ____________________________________________   LABS (all labs ordered are listed, but only abnormal results are displayed)  Labs Reviewed  CBC WITH DIFFERENTIAL/PLATELET - Abnormal; Notable for the following:       Result Value   MCV 101.6 (*)    MCH 35.9 (*)    Platelets 111 (*)    All other components within normal limits  COMPREHENSIVE METABOLIC PANEL - Abnormal; Notable for the following:    Glucose, Bld 134 (*)    Total Protein 6.2 (*)    All other components within normal limits  TROPONIN I   ____________________________________________  EKG  ED ECG REPORT I, Joanne Gavel, the attending physician, personally viewed and interpreted this ECG.   Date: 10/18/2015  EKG Time: 14:16  Rate: 63  Rhythm: normal sinus rhythm  Axis: normal  Intervals:none  ST&T Change: No acute ST elevation or acute ST depression. Q wave in V2 but accounting for differences in lead placement, the EKG is generally  unchanged from prior.  ____________________________________________  RADIOLOGY  CXR  CLINICAL DATA: Chest pain  EXAM: CHEST 2 VIEW  COMPARISON: 05/16/2015  FINDINGS: Cardiac shadow is within normal limits. The lungs are clear. Scoliosis of the thoracolumbar spine is noted and stable. No acute bony abnormality is seen.  IMPRESSION: No active cardiopulmonary disease.      ____________________________________________   PROCEDURES  Procedure(s) performed: None  Procedures  Critical Care performed: No  ____________________________________________   INITIAL IMPRESSION / ASSESSMENT AND PLAN / ED COURSE  Pertinent labs & imaging results that were available during my care of the patient were reviewed by me and considered in my medical decision making (see chart for details).  Stephanie [name] is a 70 y.o. female with history of known three-vessel coronary artery disease with a "60% blockage in an unknown vessel documented in 2009", medically managed, hyperlipidemia, hypertension, hypothyroidism, followed by Dr. Ubaldo Glassing who presents for evaluation of 3 episodes of substernal chest pressure at rest today, currently chest pain-free. On exam she is generally well-appearing and in no acute distress. Vital signs stable, she is afebrile. EKG shows Q waves in the anterior leads but accounting for changes in lead placement, the EKG is generally unchanged from prior. Concern for ACS. No shortness of breath or tachypnea, no leg swelling, not consistent with PE. Pain not ripping or tearing in nature, does not radiate to the back or down towards the feet, not consistent with acute aortic dissection. we'll obtain screening cardiac labs, chest x-ray and anticipate admission.  ----------------------------------------- 3:40 PM on 10/18/2015 ----------------------------------------- Labs reviewed. Unremarkable CBC, and CMP. Initial troponin is negative. EKG is generally unchanged from prior  however I discussed with the patient that she is at high risk for ACS and given the recurrence of pain several times today, I have recommended admission. She is collected her belongings and reports that she wants to leave immediately, she does not want admission stating "this happens to be all the time". I discussed risks of leaving [place] including the possibility that this could present a heart attack which could cause heart failure, permanent heart damage, sudden cardiac death, prolonged neurological disability, loss of current lifestyle. She voices understanding of this and states "I will just see my doctor on Monday". She does not want to stay any longer. She has reviewed and signed the [facility] paperwork. I discussed with her that if she changes her mind at any time or has any worsening pain or new symptoms, she should return and we are happy to treat her. She is comfortable with that.   Clinical Course  ____________________________________________   FINAL CLINICAL IMPRESSION(S) / ED DIAGNOSES  Final diagnoses:  Chest pain, unspecified chest pain type      NEW MEDICATIONS STARTED DURING THIS VISIT:  New Prescriptions   No medications on file     Note:  This document was prepared using Dragon voice recognition software and may include unintentional dictation errors.    Joanne Gavel, MD 10/18/15 [id number]

## 2015-10-18 NOTE — ED Notes (Signed)
Patient transported to x-ray. ?

## 2015-10-18 NOTE — ED Triage Notes (Signed)
Pt comes into the ED via EMS from home c/o central chest pain that starts to radiate to the left.  CP is intermittent and only lasts for about 5 seconds.  EKG during pain and no pain are same with no change.  Patient took nitro at home with some ease of pain.  H/o angina, anxiety, bipolar, and osteoporosis.  Patient present in NAD at this time with even and unlabored respirations.

## 2015-10-18 NOTE — ED Notes (Signed)
MD at bedside. 

## 2015-10-18 NOTE — ED Notes (Signed)
Patient came out of room yelling "I would like to see Dr. Edd Fabian".  RN went into patient room to assist and see what she needed.  Patient stated "Dr. Edd Fabian would like me to stay to r/o possible heart attack, I do not want to stay.  I came here 6 months ago for the same thing and they did not find a heart attack so I would like to go home now".  RN showed patient where call bell can be found and informed the patient that the MD would be updated.  MD notified of change in patient care.

## 2015-10-23 ENCOUNTER — Telehealth: Payer: Self-pay

## 2015-10-23 NOTE — Telephone Encounter (Signed)
pt called wanted to see if she can have something for anxiety.  pt states she lives alone and that her dog had died. and someone keeps ringing her doorbell and so she having alot of anxiety.  Also wanted to let dr. Einar Grad know that she has fall twice

## 2015-10-24 NOTE — Telephone Encounter (Signed)
Pt need to make an appt with Dr Einar Grad next week

## 2015-10-31 NOTE — Telephone Encounter (Signed)
pt has appt fo see dr. Einar Grad 11-06-15

## 2015-11-04 ENCOUNTER — Other Ambulatory Visit: Payer: Self-pay

## 2015-11-04 NOTE — Telephone Encounter (Signed)
received a fax requesting a 90 day supply of divalproes and gabapentin

## 2015-11-06 ENCOUNTER — Ambulatory Visit: Payer: 59 | Admitting: Psychiatry

## 2015-11-06 ENCOUNTER — Ambulatory Visit (INDEPENDENT_AMBULATORY_CARE_PROVIDER_SITE_OTHER): Payer: 59 | Admitting: Psychiatry

## 2015-11-06 ENCOUNTER — Encounter: Payer: Self-pay | Admitting: Psychiatry

## 2015-11-06 VITALS — BP 124/85 | HR 77 | Temp 97.8°F | Ht 60.0 in | Wt 106.6 lb

## 2015-11-06 DIAGNOSIS — F3110 Bipolar disorder, current episode manic without psychotic features, unspecified: Secondary | ICD-10-CM | POA: Diagnosis not present

## 2015-11-06 MED ORDER — QUETIAPINE FUMARATE 50 MG PO TABS: 50.0000 mg | ORAL_TABLET | Freq: Every day | ORAL | 1 refills | Status: DC

## 2015-11-06 MED ORDER — DIVALPROEX SODIUM ER 500 MG PO TB24: 500.0000 mg | ORAL_TABLET | Freq: Two times a day (BID) | ORAL | 1 refills | Status: DC

## 2015-11-06 MED ORDER — CARBAMAZEPINE 100 MG PO CHEW: CHEWABLE_TABLET | ORAL | 0 refills | Status: DC

## 2015-11-06 NOTE — Progress Notes (Signed)
Patient ID: Stephanie [name], female   DOB: 10-14-1945, 70 y.o.   MRN: [id number] [facility] MD/PA/NP OP Progress Note  11/06/2015 8:55 AM Stephanie [name]  MRN:  [id number]  Subjective:  Patient returns for follow-up of her bipolar disorder and generalized anxiety disorder. Patient reports that she had a fall and had to go to the ER, she hit her head and was worked up. She had a CT scan but no injury found. Reports the falls were due to her shoes and not medication. Reports she is taking the seroquel once daily at night, not taking the daytime dose and her mood has been stable. She has been exercising daily by swimming or walking.  Chief Complaint: doing well moodwise. Chief Complaint    Follow-up; Medication Refill     Visit Diagnosis:   No diagnosis found.  Past Medical History:  Past Medical History:  Diagnosis Date  . Anxiety   . Bipolar 1 disorder ([place])   . Bipolar disorder ([place])   . Depression   . Fatigue   . Fibromyalgia   . GERD (gastroesophageal reflux disease)   . Heart disease   . Heart failure ([place])   . HLD (hyperlipidemia)   . Hypothyroidism   . Pelvic pain in female   . Perianal lesion    high grade sil lesion- keratinizing type  . RA (rheumatoid arthritis) ([place])   . Raynaud phenomenon   . Sleep apnea   . Vaginal Pap smear, abnormal     Past Surgical History:  Procedure Laterality Date  . TONSILLECTOMY     Family History:  Family History  Problem Relation Age of Onset  . Heart failure Father   . Parkinson's disease Mother   . Bipolar disorder Mother   . Depression Sister   . Schizophrenia Paternal Grandmother   . Diabetes Maternal Grandmother   . Pancreatic cancer Maternal Grandmother   . Cancer Neg Hx   . Heart disease Neg Hx    Social History:  Social History   Social History  . Marital status: Divorced    Spouse name: N/A  . Number of children: N/A  . Years of education: N/A   Social History Main Topics  . Smoking status: Never Smoker  .  Smokeless tobacco: Former Systems developer  . Alcohol use No  . Drug use: No  . Sexual activity: No   Other Topics Concern  . None   Social History Narrative  . None   Additional History:   Assessment:   Musculoskeletal: Strength & Muscle Tone: within normal limits Gait & Station: unsteady, Walking with a cane Patient leans: N/A  Psychiatric Specialty Exam: HPI  Review of Systems  Psychiatric/Behavioral: Negative for depression, hallucinations, memory loss, substance abuse and suicidal ideas. The patient is not nervous/anxious and does not have insomnia.   All other systems reviewed and are negative.   Blood pressure 124/85, pulse 77, temperature 97.8 F (36.6 C), temperature source Oral, height 5' (1.524 m), weight 106 lb 9.6 oz (48.4 kg).Body mass index is 20.82 kg/m.  General Appearance: Neat and Well Groomed  Eye Contact:  Good  Speech:  Normal Rate  Volume:  Normal  Mood: good  Affect:  Congruent  Thought Process:  Linear and Logical  Orientation:  Full (Time, Place, and Person)  Thought Content:  Negative  Suicidal Thoughts:  No  Homicidal Thoughts:  No  Memory:  Immediate;   Good Recent;   Good Remote;   Good  Judgement:  Good  Insight:  Good  Psychomotor Activity:  Negative  Concentration:  Good  Recall:  Good  Fund of Knowledge: Good  Language: Good  Akathisia:  Negative  Handed:  Right unknown   AIMS (if indicated):    Assets:  Communication Skills Desire for Improvement Social Support  ADL's:  Intact  Cognition: WNL  Sleep:  good   Is the patient at risk to self?  No. Has the patient been a risk to self in the past 6 months?  No. Has the patient been a risk to self within the distant past?  No. Is the patient a risk to others?  No. Has the patient been a risk to others in the past 6 months?  No. Has the patient been a risk to others within the distant past?  No.  Current Medications: Current Outpatient Prescriptions  Medication Sig Dispense Refill   . aspirin 81 MG tablet Take 81 mg by mouth daily.     . Calcium Citrate-Vitamin D ([employer] CALCIUM CITRATE +D3 MINI) 200-250 MG-UNIT TABS Take 1 tablet by mouth daily.     . carbamazepine (TEGRETOL) 100 MG chewable tablet Take 6 chewable tablets by mouth at bedtime 540 tablet 0  . divalproex (DEPAKOTE ER) 500 MG 24 hr tablet Take 1 tablet (500 mg total) by mouth 2 (two) times daily. 60 tablet 2  . folic acid (FOLVITE) 1 MG tablet Take 1 mg by mouth daily.     Marland Kitchen gabapentin (NEURONTIN) 300 MG capsule Take 1 capsule (300 mg total) by mouth 2 (two) times daily. 60 capsule 0  . levothyroxine (SYNTHROID) 75 MCG tablet Take 75 mcg by mouth daily before breakfast.     . methotrexate 2.5 MG tablet Take 2.5 mg by mouth daily.     . nitroGLYCERIN (NITROSTAT) 0.4 MG SL tablet Place 0.4 mg under the tongue every 5 (five) minutes as needed.     . pravastatin (PRAVACHOL) 10 MG tablet Take 10 mg by mouth daily.     . QUEtiapine (SEROQUEL) 50 MG tablet Take 1 tablet (50 mg total) by mouth 2 (two) times daily. 180 tablet 0  . ranitidine (ZANTAC) 150 MG tablet Take 150 mg by mouth 2 (two) times daily.      No current facility-administered medications for this visit.     Medical Decision Making:  Established Problem, Stable/Improving (1) and Review of Medication Regimen & Side Effects (2)  Treatment Plan Summary:Medication management and Plan We'll continue patient on her medications without any changes.  Bipolar disorder-   Continue Neurontin 300 mg twice daily,  Continue tegretol at 300mg  po bid, Depakote 500 mg twice daily.  Continue  Seroquel to 50 mg po qhs.  Labs show her levels were within normal limits.  Return to clinic in 2 months.  She's been encouraged call any questions or concerns prior to her next appointment.   Shreena Baines 11/06/2015, 8:55 AM

## 2015-11-08 NOTE — Telephone Encounter (Signed)
called in ok to do a 90 day supply on gabapentin and on seroquel.

## 2015-12-13 ENCOUNTER — Telehealth: Payer: Self-pay

## 2015-12-13 NOTE — Telephone Encounter (Signed)
pt called stated that she called the after hours number last night and that she was told to call today and make an appt to be seen.  pt having difficulty sleeping, racing thoughts, impuslve, moody.  pt thinks she needs to go to 100mg  of seroquel.   I gave patient an appt for Tuesday.

## 2015-12-16 NOTE — Telephone Encounter (Signed)
Patient to be called and she can increase the Seroquel 200 mg until she can come for an appointment.

## 2015-12-17 ENCOUNTER — Encounter: Payer: Self-pay | Admitting: Psychiatry

## 2015-12-17 ENCOUNTER — Ambulatory Visit (INDEPENDENT_AMBULATORY_CARE_PROVIDER_SITE_OTHER): Payer: 59 | Admitting: Psychiatry

## 2015-12-17 VITALS — BP 129/76 | HR 83 | Wt 108.6 lb

## 2015-12-17 DIAGNOSIS — G4700 Insomnia, unspecified: Secondary | ICD-10-CM | POA: Diagnosis not present

## 2015-12-17 DIAGNOSIS — F3110 Bipolar disorder, current episode manic without psychotic features, unspecified: Secondary | ICD-10-CM

## 2015-12-17 MED ORDER — QUETIAPINE FUMARATE 100 MG PO TABS: 100.0000 mg | ORAL_TABLET | Freq: Every day | ORAL | 0 refills | Status: DC

## 2015-12-17 NOTE — Progress Notes (Signed)
Patient ID: Stephanie [name], female   DOB: [birth year], 70 y.o.   MRN: [id number] [facility] MD/PA/NP OP Progress Note  12/17/2015 2:40 PM Stephanie [name]  MRN:  [id number]  Subjective:  Patient returns for follow-up of her bipolar disorder and generalized anxiety disorder. Patient reports she has been more manic , states she was dancing down the aisle in her church. She has not been sleeping well for about a week. She has racing thoughts, lack of focus and concentration. Has been talking too much. Impaired memory and cognition. She has been more impulsive and irritability.  Chief Complaint: doing well moodwise. Chief Complaint    Follow-up; Medication Refill     Visit Diagnosis:     ICD-9-CM ICD-10-CM   1. Bipolar I disorder, most recent episode (or current) manic ([place]) 296.40 F31.10   2. Persistent disorder of initiating or maintaining sleep 307.42 G47.00     Past Medical History:  Past Medical History:  Diagnosis Date  . Anxiety   . Bipolar 1 disorder ([place])   . Bipolar disorder ([place])   . Depression   . Fatigue   . Fibromyalgia   . GERD (gastroesophageal reflux disease)   . Heart disease   . Heart failure ([place])   . HLD (hyperlipidemia)   . Hypothyroidism   . Pelvic pain in female   . Perianal lesion    high grade sil lesion- keratinizing type  . RA (rheumatoid arthritis) ([place])   . Raynaud phenomenon   . Sleep apnea   . Vaginal Pap smear, abnormal     Past Surgical History:  Procedure Laterality Date  . TONSILLECTOMY     Family History:  Family History  Problem Relation Age of Onset  . Heart failure Father   . Parkinson's disease Mother   . Bipolar disorder Mother   . Depression Sister   . Schizophrenia Paternal Grandmother   . Diabetes Maternal Grandmother   . Pancreatic cancer Maternal Grandmother   . Cancer Neg Hx   . Heart disease Neg Hx    Social History:  Social History   Social History  . Marital status: Divorced    Spouse name: N/A  . Number of children:  N/A  . Years of education: N/A   Social History Main Topics  . Smoking status: Never Smoker  . Smokeless tobacco: Former Systems developer  . Alcohol use No  . Drug use: No  . Sexual activity: No   Other Topics Concern  . None   Social History Narrative  . None   Additional History:   Assessment:   Musculoskeletal: Strength & Muscle Tone: within normal limits Gait & Station: unsteady, Walking with a cane Patient leans: N/A  Psychiatric Specialty Exam: Medication Refill     Review of Systems  Psychiatric/Behavioral: Negative for depression, hallucinations, memory loss, substance abuse and suicidal ideas. The patient is not nervous/anxious and does not have insomnia.   All other systems reviewed and are negative.   Blood pressure 129/76, pulse 83, weight 108 lb 9.6 oz (49.3 kg).Body mass index is 21.21 kg/m.  General Appearance: Neat and Well Groomed  Eye Contact:  Good  Speech:  Normal Rate  Volume:  Normal  Mood: too good  Affect:  Congruent  Thought Process:  Linear and Logical  Orientation:  Full (Time, Place, and Person)  Thought Content:  Negative  Suicidal Thoughts:  No  Homicidal Thoughts:  No  Memory:  Immediate;   Good Recent;   Good Remote;   Good  Judgement:  Good  Insight:  Good  Psychomotor Activity:  Negative  Concentration:  Good  Recall:  Good  Fund of Knowledge: Good  Language: Good  Akathisia:  Negative  Handed:  Right unknown   AIMS (if indicated):    Assets:  Communication Skills Desire for Improvement Social Support  ADL's:  Intact  Cognition: WNL  Sleep:  poor   Is the patient at risk to self?  No. Has the patient been a risk to self in the past 6 months?  No. Has the patient been a risk to self within the distant past?  No. Is the patient a risk to others?  No. Has the patient been a risk to others in the past 6 months?  No. Has the patient been a risk to others within the distant past?  No.  Current Medications: Current Outpatient  Prescriptions  Medication Sig Dispense Refill  . aspirin 81 MG tablet Take 81 mg by mouth daily.     . Calcium Citrate-Vitamin D ([employer]S CALCIUM CITRATE +D3 MINI) 200-250 MG-UNIT TABS Take 1 tablet by mouth daily.     . carbamazepine (TEGRETOL) 100 MG chewable tablet Take 6 chewable tablets by mouth at bedtime 540 tablet 0  . divalproex (DEPAKOTE ER) 500 MG 24 hr tablet Take 1 tablet (500 mg total) by mouth 2 (two) times daily. 99991111 tablet 1  . folic acid (FOLVITE) 1 MG tablet Take 1 mg by mouth daily.     Marland Kitchen gabapentin (NEURONTIN) 300 MG capsule Take 1 capsule (300 mg total) by mouth 2 (two) times daily. 60 capsule 0  . levothyroxine (SYNTHROID) 75 MCG tablet Take 75 mcg by mouth daily before breakfast.     . methotrexate 2.5 MG tablet Take 2.5 mg by mouth daily.     . nitroGLYCERIN (NITROSTAT) 0.4 MG SL tablet Place 0.4 mg under the tongue every 5 (five) minutes as needed.     . pravastatin (PRAVACHOL) 10 MG tablet Take 10 mg by mouth daily.     . QUEtiapine (SEROQUEL) 50 MG tablet Take 1 tablet (50 mg total) by mouth at bedtime. 90 tablet 1  . ranitidine (ZANTAC) 150 MG tablet Take 150 mg by mouth 2 (two) times daily.      No current facility-administered medications for this visit.     Medical Decision Making:  Established Problem, Stable/Improving (1) and Review of Medication Regimen & Side Effects (2)  Treatment Plan Summary:Medication management and Plan We'll continue patient on her medications without any changes.  Bipolar disorder-   Continue Neurontin 300 mg twice daily,  Continue tegretol at 300mg  po bid, Depakote 500 mg twice daily.  Increase Seroquel to 100 mg po qhs.  Return to clinic in 1 months.  She's been encouraged call any questions or concerns prior to her next appointment.   Heleena Miceli 12/17/2015, 2:40 PM

## 2016-01-01 ENCOUNTER — Ambulatory Visit: Payer: Self-pay | Admitting: Obstetrics and Gynecology

## 2016-01-15 ENCOUNTER — Ambulatory Visit (INDEPENDENT_AMBULATORY_CARE_PROVIDER_SITE_OTHER): Payer: 59 | Admitting: Psychiatry

## 2016-01-15 ENCOUNTER — Encounter: Payer: Self-pay | Admitting: Psychiatry

## 2016-01-15 VITALS — BP 124/72 | HR 77 | Ht 59.75 in | Wt 106.0 lb

## 2016-01-15 DIAGNOSIS — F3110 Bipolar disorder, current episode manic without psychotic features, unspecified: Secondary | ICD-10-CM

## 2016-01-15 MED ORDER — DIVALPROEX SODIUM ER 500 MG PO TB24: 500.0000 mg | ORAL_TABLET | Freq: Two times a day (BID) | ORAL | 1 refills | Status: DC

## 2016-01-15 MED ORDER — CARBAMAZEPINE 100 MG PO CHEW: CHEWABLE_TABLET | ORAL | 0 refills | Status: DC

## 2016-01-15 MED ORDER — QUETIAPINE FUMARATE 100 MG PO TABS: 100.0000 mg | ORAL_TABLET | Freq: Every day | ORAL | 0 refills | Status: DC
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
## 2016-01-22 ENCOUNTER — Encounter: Payer: Self-pay | Admitting: Obstetrics and Gynecology

## 2016-01-22 ENCOUNTER — Ambulatory Visit: Payer: 59 | Admitting: Psychiatry

## 2016-01-22 ENCOUNTER — Ambulatory Visit (INDEPENDENT_AMBULATORY_CARE_PROVIDER_SITE_OTHER): Payer: Medicare Other | Admitting: Obstetrics and Gynecology

## 2016-01-22 VITALS — BP 117/77 | HR 73 | Ht 59.75 in | Wt 104.5 lb

## 2016-01-22 DIAGNOSIS — D071 Carcinoma in situ of vulva: Secondary | ICD-10-CM

## 2016-01-22 NOTE — Progress Notes (Signed)
Chief complaint: 1. Vulvar colposcopy 2. 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 Patient remains asymptomatic without vulvar itching burning or pain she has not noted any new lesions on her perineum. Colposcopy 1 year ago was negative for abnormality  Past medical history, past surgical history, problem list, medications, and allergies are reviewed  OBJECTIVE: BP 117/77   Pulse 73   Ht 4' 11.75" (1.518 m)   Wt 104 lb 8 oz (47.4 kg)   BMI 20.58 kg/m  Pleasant elderly female in no acute distress Pelvic exam: External genitalia-with atrophic changes; no gross lesions Vagina-atrophic changes   PROCEDURE: Vulvar colposcopy. Indications: History of VIN 3; status post wide local excision 2015 Findings: No lesions on gross inspection; normal vulvar colposcopy without evidence of abnormality  IMPRESSION: 1.  History of VIN 3, status post wide local excision. 2.  Adequate colposcopy today without evidence of disease.  PLAN: 1.  Return in 1 year for final vulvar colposcopy.  If normal, will return patient to routine annual screening.  Brayton Mars, MD  Note: This dictation was prepared with Dragon dictation along with smaller phrase technology. Any transcriptional errors that result from this process are unintentional.

## 2016-02-04 ENCOUNTER — Other Ambulatory Visit: Payer: Self-pay | Admitting: Family Medicine

## 2016-02-04 DIAGNOSIS — Z1231 Encounter for screening mammogram for malignant neoplasm of breast: Secondary | ICD-10-CM

## 2016-02-27 ENCOUNTER — Telehealth: Payer: Self-pay

## 2016-02-27 NOTE — Telephone Encounter (Signed)
per the pharmacy pt is taking gabapentin.    A 90 day supply was called in of her medication on  11-04-15 pt was seen on  11-06-15, 12-17-15 and  01-15-16 were it is noted that she is taking gabapentin but there is no mention that she was to stop taking.  Pt is still taking gabapentin.

## 2016-02-27 NOTE — Telephone Encounter (Signed)
request for refill on gabapentin 300mg  take 1 capsule by mouth twice daily

## 2016-02-28 ENCOUNTER — Other Ambulatory Visit (HOSPITAL_COMMUNITY): Payer: Self-pay | Admitting: Psychiatry

## 2016-02-28 MED ORDER — GABAPENTIN 300 MG PO CAPS: 300.0000 mg | ORAL_CAPSULE | Freq: Two times a day (BID) | ORAL | 1 refills | Status: DC

## 2016-02-28 NOTE — Telephone Encounter (Signed)
I did call in a one-month supply with one refill. I also called the patient's home phone and left a message that if she was taking the Neurontin at this dosage she can continue taking it if she has not been taking it on she does not have to take this. It still not clear if the pharmacy called for the medication or the patient herself called

## 2016-03-19 ENCOUNTER — Ambulatory Visit
Admission: RE | Admit: 2016-03-19 | Discharge: 2016-03-19 | Disposition: A | Discharge status: Home / Self Care | Payer: Medicare Other | Source: Ambulatory Visit | Attending: Family Medicine | Admitting: Family Medicine

## 2016-03-19 DIAGNOSIS — Z1231 Encounter for screening mammogram for malignant neoplasm of breast: Secondary | ICD-10-CM | POA: Diagnosis present

## 2016-03-20 ENCOUNTER — Ambulatory Visit: Payer: Medicare Other

## 2016-03-25 ENCOUNTER — Ambulatory Visit: Payer: Medicare Other | Admitting: Occupational Therapy

## 2016-04-01 ENCOUNTER — Ambulatory Visit: Payer: Medicare Other | Attending: Rheumatology | Admitting: Occupational Therapy

## 2016-04-01 DIAGNOSIS — M79641 Pain in right hand: Secondary | ICD-10-CM | POA: Insufficient documentation

## 2016-04-01 DIAGNOSIS — M25541 Pain in joints of right hand: Secondary | ICD-10-CM | POA: Insufficient documentation

## 2016-04-01 DIAGNOSIS — R278 Other lack of coordination: Secondary | ICD-10-CM | POA: Insufficient documentation

## 2016-04-01 DIAGNOSIS — R600 Localized edema: Secondary | ICD-10-CM | POA: Insufficient documentation

## 2016-04-01 NOTE — Therapy (Signed)
[facility] [address], [place], [address] Phone: [phone]   Fax:  [phone]  Occupational Therapy Evaluation  Patient Details  Name: Stephanie [name] MRN: [id number] Date of Birth: Nov 09, 1945 Referring Provider: Dr Merita Norton  Encounter Date: 04/01/2016      OT End of Session - 04/01/16 1310    Visit Number 1   Number of Visits 4   Date for OT Re-Evaluation 04/29/16   OT Start Time 1120   OT Stop Time 1217   OT Time Calculation (min) 57 min   Activity Tolerance Patient tolerated treatment well   Behavior During Therapy [facility] for tasks assessed/performed      Past Medical History:  Diagnosis Date  . Anxiety   . Bipolar 1 disorder ([place])   . Bipolar disorder ([place])   . Depression   . Fatigue   . Fibromyalgia   . GERD (gastroesophageal reflux disease)   . Heart disease   . Heart failure ([place])   . HLD (hyperlipidemia)   . Hypothyroidism   . Pelvic pain in female   . Perianal lesion    high grade sil lesion- keratinizing type  . RA (rheumatoid arthritis) ([place])   . Raynaud phenomenon   . Sleep apnea   . Vaginal Pap smear, abnormal     Past Surgical History:  Procedure Laterality Date  . TONSILLECTOMY      There were no vitals filed for this visit.      Subjective Assessment - 04/01/16 1135    Subjective  The MD thought to send me - because of my middle finger on the R hand is dropping down and pushing my other 2 fingers  - pain still worse in the R hand than L hand -    Patient Stated Goals I want to prevent my my fingers to go sideways - I do not want surgery    Currently in Pain? Yes   Pain Score 5    Pain Location Hand   Pain Orientation Right   Pain Descriptors / Indicators Aching   Pain Type Chronic pain   Pain Onset More than a month ago           [facility] OT Assessment - 04/01/16 0001      Assessment   Diagnosis Pain in R hand   Referring Provider Dr Merita Norton   Onset Date 03/16/16   Prior Therapy Had last 6/18 OT     Home  Environment   Lives With Alone     Prior Function   Level of [place] Retired;On disability   Leisure Live alone , watch movies , go out to eat, paint ,     AROM   Overall AROM Comments Pt is able to make 75% of fist with right hand. Flat fist is WFL's, unable to make composite fist right.   Right/Left Finger Right   Right Composite Finger Extension 75%   Right Composite Finger Flexion 75%     Strength   Right Hand Grip (lbs) 27   Right Hand Lateral Pinch 9 lbs   Right Hand 3 Point Pinch 8 lbs   Left Hand Grip (lbs) 35   Left Hand Lateral Pinch 11 lbs   Left Hand 3 Point Pinch 10 lbs     Right Hand AROM   R Long  MCP 0-90 --  extention lag - unable to tapping  Pt fitted with Neoprene anti ulnar deviation splint - pt to gradually increase from 30 min at time - to 2-3 hrs  Few times during day - in the next week  Check for skin issues in webspace  Pt demo donning and doffing   Cont with HEP from last year  Tendon glides Opposition  RD of digits - with paper under 3rd digit                OT Education - 04/01/16 1310    Education provided Yes   Education Details HEP reviewed   Person(s) Educated Patient   Methods Explanation;Demonstration;Tactile cues;Verbal cues;Handout   Comprehension Verbal cues required;Returned demonstration;Verbalized understanding          OT Short Term Goals - 04/01/16 1313      OT SHORT TERM GOAL #1   Title Pt wil be Mod I AROM/HEP ex's right hand   Baseline did not do it for few months    Time 2   Period Weeks   Status New           OT Long Term Goals - 04/01/16 1314      OT LONG TERM GOAL #1   Title Pt will be verbalizing into getting 1-3 AE to increase ease and take pressure of hands during ADL's   Baseline HOld book with lateral grip - increase UD of digits    Time 4   Period Weeks   Status New     OT  LONG TERM GOAL #2   Title Pt to be ind in donning and doffing of  anti ulnar deviation splint - and wearing them with no issues    Baseline increase UD and decrease extention of 3rd digit on R hand    Time 4   Period Weeks   Status New               Plan - 04/01/16 1311    Clinical Impression Statement Pt refer to cont increase R hand pain more than L - decrease 3rd digit extention and more ulnar deviation - pushing 4th and 5th into UD - pt also in need for review of HEP from last year - and  fitting with appropriate splint to decrease progressing of UD of digits     Rehab Potential Good   OT Frequency 1x / week   OT Duration 4 weeks   OT Treatment/Interventions Self-care/ADL training;Splinting;Patient/family education;Therapeutic exercises;Parrafin;Manual Therapy   Plan assess use of splint and HEP    OT Home Exercise Plan See pt instruction   Consulted and Agree with Plan of Care Patient      Patient will benefit from skilled therapeutic intervention in order to improve the following deficits and impairments:  Decreased range of motion, Impaired flexibility, Impaired UE functional use, Pain, Decreased knowledge of use of DME  Visit Diagnosis: Hand pain, right - Plan: Ot plan of care cert/re-cert  Pain in joint of right hand - Plan: Ot plan of care cert/re-cert    Problem List Patient Active Problem List   Diagnosis Date Noted  . Chest pain 05/17/2015  . OP (osteoporosis) 02/25/2015  . Sjogren's syndrome ([place]) 02/20/2015  . Combined fat and carbohydrate induced hyperlipemia 10/03/2014  . Breathlessness on exertion 09/24/2014  . Bipolar 1 disorder with moderate mania ([place]) 08/29/2014  . Affective bipolar disorder ([place]) 08/29/2014  . H/O gastrointestinal disease 08/29/2014  . Anxiety, generalized 08/29/2014  . Fibromyalgia 08/29/2014  . H/O elevated lipids 08/29/2014  .  H/O: hypothyroidism 08/29/2014  . Insomnia, persistent 08/29/2014  . Anankastic personality  disorder 08/29/2014  . Chronic pain associated with significant psychosocial dysfunction 08/29/2014  . 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 Rheumatoid arthritis ([place]) 08/29/2014  . Benign essential HTN 01/10/2014  . 3-vessel CAD 01/10/2014  . Polypharmacy 06/29/2013  . Other long term (current) drug therapy 06/29/2013  . Arthritis or polyarthritis, rheumatoid ([place]) 06/22/2013  . Acid reflux 05/05/2012  . Adult hypothyroidism 01/07/2012  . Flu vaccine need 01/07/2012  . Cough 12/14/2011  . CD (contact dermatitis) 10/15/2011  . Finger wound, simple, open 10/15/2011    Megin Consalvo OTR/L,CLT 04/01/2016, 1:20 PM  [facility] [address], [place], [address] Phone: [phone]   Fax:  [phone]  Name: Stephanie [name] MRN: [id number] Date of Birth: 09-23-1945

## 2016-04-01 NOTE — Patient Instructions (Signed)
Pt fitted with Neoprene anti ulnar deviation splint - pt to gradually increase from 30 min at time - to 2-3 hrs  Few times during day - in the next week  Check for skin issues in webspace  Pt demo donning and doffing   Cont with HEP from last year  Tendon glides Opposition  RD of digits - with paper under 3rd digit

## 2016-04-08 ENCOUNTER — Ambulatory Visit: Payer: Medicare Other | Admitting: Occupational Therapy

## 2016-04-08 DIAGNOSIS — R600 Localized edema: Secondary | ICD-10-CM

## 2016-04-08 DIAGNOSIS — M79641 Pain in right hand: Secondary | ICD-10-CM

## 2016-04-08 DIAGNOSIS — M25541 Pain in joints of right hand: Secondary | ICD-10-CM

## 2016-04-08 DIAGNOSIS — R278 Other lack of coordination: Secondary | ICD-10-CM

## 2016-04-08 NOTE — Patient Instructions (Signed)
Same HEP  

## 2016-04-08 NOTE — Therapy (Signed)
[facility] [address] [place], [address] Phone: [phone]   Fax:  [phone]  Occupational Therapy Treatment  Patient Details  Name: Stephanie [name] MRN: [id number] Date of Birth: 09-03-1945 Referring Provider: Dr Merita Norton  Encounter Date: 04/08/2016      OT End of Session - 04/08/16 1503    Visit Number 2   Number of Visits 4   Date for OT Re-Evaluation 04/29/16   OT Start Time 1118   OT Stop Time 1201   OT Time Calculation (min) 43 min   Activity Tolerance Patient tolerated treatment well   Behavior During Therapy [facility] for tasks assessed/performed      Past Medical History:  Diagnosis Date  . Anxiety   . Bipolar 1 disorder ([place])   . Bipolar disorder ([place])   . Depression   . Fatigue   . Fibromyalgia   . GERD (gastroesophageal reflux disease)   . Heart disease   . Heart failure ([place])   . HLD (hyperlipidemia)   . Hypothyroidism   . Pelvic pain in female   . Perianal lesion    high grade sil lesion- keratinizing type  . RA (rheumatoid arthritis) ([place])   . Raynaud phenomenon   . Sleep apnea   . Vaginal Pap smear, abnormal     Past Surgical History:  Procedure Laterality Date  . TONSILLECTOMY      There were no vitals filed for this visit.      Subjective Assessment - 04/08/16 1458    Subjective  Do I have my splint on correctly - my fingers turning purple - and I cannot wear it 2 hrs -  I did do some of the exercises - did get bookstand - but  I am looking into getting tablet or something    Patient Stated Goals I want to prevent my my fingers to go sideways - I do not want surgery    Currently in Pain? Yes   Pain Score 4    Pain Location Hand   Pain Orientation Right   Pain Descriptors / Indicators Aching   Pain Type Chronic pain      Reviewed 5 x with pt and provided written instructions for donning  Neoprene anti ulnar deviation splint - pt to gradually increase from 30  min at time - to 2-3 hrs  Few times during day - in the next week  Check for skin issues in webspace  Pt demo donning and doffing  Correctly   Reviewed with pt  Tendon glides Opposition  RD of digits - with paper under 3rd digit 10 reps   need mod v/c and min A  Pt lift hand off table during RD  And cannot make Oval with 4th and 5th digits during opposition  Reviewed with pt use of hands in book or tablet - pt had several questions on options to help her hand pain  Ed on options of [place] [employer] and [employer] provided                           OT Education - 04/08/16 1502    Education provided Yes   Education Details splint use, AE and joint protection , HEP    Person(s) Educated Patient   Methods Explanation;Demonstration;Tactile cues;Verbal cues;Handout   Comprehension Verbal cues required;Returned demonstration;Verbalized understanding          OT  Short Term Goals - 04/01/16 1313      OT SHORT TERM GOAL #1   Title Pt wil be Mod I AROM/HEP ex's right hand   Baseline did not do it for few months    Time 2   Period Weeks   Status New           OT Long Term Goals - 04/01/16 1314      OT LONG TERM GOAL #1   Title Pt will be verbalizing into getting 1-3 AE to increase ease and take pressure of hands during ADL's   Baseline HOld book with lateral grip - increase UD of digits    Time 4   Period Weeks   Status New     OT LONG TERM GOAL #2   Title Pt to be ind in donning and doffing of  anti ulnar deviation splint - and wearing them with no issues    Baseline increase UD and decrease extention of 3rd digit on R hand    Time 4   Period Weeks   Status New               Plan - 04/08/16 1503    Clinical Impression Statement Pt arrive with anti ulnar devation splint - pt had it around PIP's and to tight cutting her circulation - reviewed and written instructions provided - also ed  on tablet use , answer some questions - AE trng -  and needed min A for HEP for ROM - pt to be seen one more session   Rehab Potential Good   OT Frequency 1x / week   OT Duration 2 weeks   OT Treatment/Interventions Self-care/ADL training;Splinting;Patient/family education;Therapeutic exercises;Parrafin;Manual Therapy   Plan Review donning of splint , use of Kindle , HEP for ROM review   OT Home Exercise Plan See pt instruction   Consulted and Agree with Plan of Care Patient      Patient will benefit from skilled therapeutic intervention in order to improve the following deficits and impairments:  Decreased range of motion, Impaired flexibility, Impaired UE functional use, Pain, Decreased knowledge of use of DME  Visit Diagnosis: Hand pain, right  Pain in joint of right hand  Other lack of coordination  Hand edema    Problem List Patient Active Problem List   Diagnosis Date Noted  . Chest pain 05/17/2015  . OP (osteoporosis) 02/25/2015  . Sjogren's syndrome ([place]) 02/20/2015  . Combined fat and carbohydrate induced hyperlipemia 10/03/2014  . Breathlessness on exertion 09/24/2014  . Bipolar 1 disorder with moderate mania ([place]) 08/29/2014  . Affective bipolar disorder ([place]) 08/29/2014  . H/O gastrointestinal disease 08/29/2014  . Anxiety, generalized 08/29/2014  . Fibromyalgia 08/29/2014  . H/O elevated lipids 08/29/2014  . H/O: hypothyroidism 08/29/2014  . Insomnia, persistent 08/29/2014  . 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 Chronic pain associated with significant psychosocial dysfunction 08/29/2014  . 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 Rheumatoid arthritis ([place]) 08/29/2014  . Benign essential HTN 01/10/2014  . 3-vessel CAD 01/10/2014  . Polypharmacy 06/29/2013  . Other long term (current) drug therapy 06/29/2013  . Arthritis or polyarthritis, rheumatoid ([place]) 06/22/2013  . Acid reflux 05/05/2012  . Adult hypothyroidism 01/07/2012  . Flu vaccine need  01/07/2012  . Cough 12/14/2011  . CD (contact dermatitis) 10/15/2011  . Finger wound, simple, open 10/15/2011    Rosalyn Gess OTR/L,CLT 04/08/2016, 3:06 PM  [facility] [address], [place], [address] Phone: [phone]   Fax:  [phone]  Name: CONSUELLO UNDERHILL MRN: [id number] Date of Birth: 07/21/1945

## 2016-04-15 ENCOUNTER — Encounter: Payer: Medicare Other | Admitting: Occupational Therapy

## 2016-04-15 ENCOUNTER — Encounter: Payer: Self-pay | Admitting: Psychiatry

## 2016-04-15 ENCOUNTER — Ambulatory Visit (INDEPENDENT_AMBULATORY_CARE_PROVIDER_SITE_OTHER): Payer: 59 | Admitting: Psychiatry

## 2016-04-15 VITALS — BP 115/74 | HR 70 | Temp 97.5°F | Wt 103.6 lb

## 2016-04-15 DIAGNOSIS — F3110 Bipolar disorder, current episode manic without psychotic features, unspecified: Secondary | ICD-10-CM

## 2016-04-15 MED ORDER — QUETIAPINE FUMARATE 100 MG PO TABS: 100.0000 mg | ORAL_TABLET | Freq: Every day | ORAL | 0 refills | Status: DC

## 2016-04-15 MED ORDER — DIVALPROEX SODIUM ER 500 MG PO TB24: 500.0000 mg | ORAL_TABLET | Freq: Two times a day (BID) | ORAL | 1 refills | Status: DC

## 2016-04-15 MED ORDER — GABAPENTIN 300 MG PO CAPS: 300.0000 mg | ORAL_CAPSULE | Freq: Two times a day (BID) | ORAL | 2 refills | Status: DC

## 2016-04-15 MED ORDER — CARBAMAZEPINE 100 MG PO CHEW: CHEWABLE_TABLET | ORAL | 0 refills | Status: DC

## 2016-04-15 NOTE — Progress Notes (Signed)
Patient ID: Stephanie [name], female   DOB: 05-12-1945, 71 y.o.   MRN: [id number] [facility] MD/PA/NP OP Progress Note  04/15/2016 10:52 AM MARIELOUISE PAJARES  MRN:  [id number]  Subjective:  Patient returns for follow-up of her bipolar disorder and generalized anxiety disorder. Patient reports that she has been doing quite well mood wise. Reports that she takes the Seroquel at 50 mg some nights and increase this at 200 mg as needed. States this regimen is working well for her. Reports that she had a recent episode off work to go which was unsettling but she is doing better now. Her primary care doctor had put her on Dramamine which she took for a few days and she is doing much better. Patient states that her daughter has asked her to visit an account and she has some anxiety around it but looking forward to the trip. She is sleeping well and eating well. She denies any manic symptoms.    Chief Complaint: doing well moodwise. Chief Complaint    Follow-up; Medication Refill     Visit Diagnosis:     ICD-9-CM ICD-10-CM   1. Bipolar I disorder, most recent episode (or current) manic ([place]) 296.40 F31.10     Past Medical History:  Past Medical History:  Diagnosis Date  . Anxiety   . Bipolar 1 disorder ([place])   . Bipolar disorder ([place])   . Depression   . Fatigue   . Fibromyalgia   . GERD (gastroesophageal reflux disease)   . Heart disease   . Heart failure ([place])   . HLD (hyperlipidemia)   . Hypothyroidism   . Pelvic pain in female   . Perianal lesion    high grade sil lesion- keratinizing type  . RA (rheumatoid arthritis) ([place])   . Raynaud phenomenon   . Sleep apnea   . Vaginal Pap smear, abnormal     Past Surgical History:  Procedure Laterality Date  . TONSILLECTOMY     Family History:  Family History  Problem Relation Age of Onset  . Heart failure Father   . Parkinson's disease Mother   . Bipolar disorder Mother   . Depression Sister   . Schizophrenia Paternal Grandmother   . Diabetes  Maternal Grandmother   . Pancreatic cancer Maternal Grandmother   . Cancer Neg Hx   . Heart disease Neg Hx    Social History:  Social History   Social History  . Marital status: Divorced    Spouse name: N/A  . Number of children: N/A  . Years of education: N/A   Social History Main Topics  . Smoking status: Never Smoker  . Smokeless tobacco: Former Systems developer  . Alcohol use No  . Drug use: No  . Sexual activity: No   Other Topics Concern  . None   Social History Narrative  . None   Additional History:   Assessment:   Musculoskeletal: Strength & Muscle Tone: within normal limits Gait & Station: unsteady, Walking with a cane Patient leans: N/A  Psychiatric Specialty Exam: Medication Refill     Review of Systems  Psychiatric/Behavioral: Negative for depression, hallucinations, memory loss, substance abuse and suicidal ideas. The patient is not nervous/anxious and does not have insomnia.   All other systems reviewed and are negative.   Blood pressure 115/74, pulse 70, temperature 97.5 F (36.4 C), temperature source Oral, weight 103 lb 9.6 oz (47 kg).Body mass index is 20.4 kg/m.  General Appearance: Neat and Well Groomed  Eye Contact:  Good  Speech:  Normal Rate  Volume:  Normal  Mood: Stable   Affect:  Congruent  Thought Process:  Linear and Logical  Orientation:  Full (Time, Place, and Person)  Thought Content:  Negative  Suicidal Thoughts:  No  Homicidal Thoughts:  No  Memory:  Immediate;   Good Recent;   Good Remote;   Good  Judgement:  Good  Insight:  Good  Psychomotor Activity:  Negative  Concentration:  Good  Recall:  Good  Fund of Knowledge: Good  Language: Good  Akathisia:  Negative  Handed:  Right unknown   AIMS (if indicated):    Assets:  Communication Skills Desire for Improvement Social Support  ADL's:  Intact  Cognition: WNL  Sleep:  Improved significantly    Is the patient at risk to self?  No. Has the patient been a risk to self in  the past 6 months?  No. Has the patient been a risk to self within the distant past?  No. Is the patient a risk to others?  No. Has the patient been a risk to others in the past 6 months?  No. Has the patient been a risk to others within the distant past?  No.  Current Medications: Current Outpatient Prescriptions  Medication Sig Dispense Refill  . aspirin 81 MG tablet Take 81 mg by mouth daily.     . Calcium Citrate-Vitamin D ([employer] CALCIUM CITRATE +D3 MINI) 200-250 MG-UNIT TABS Take 1 tablet by mouth daily.     . carbamazepine (TEGRETOL) 100 MG chewable tablet Take 6 chewable tablets by mouth at bedtime 540 tablet 0  . divalproex (DEPAKOTE ER) 500 MG 24 hr tablet Take 1 tablet (500 mg total) by mouth 2 (two) times daily. [id number] tablet 1  . folic acid (FOLVITE) 1 MG tablet Take 1 mg by mouth daily.     Marland Kitchen gabapentin (NEURONTIN) 300 MG capsule Take 1 capsule (300 mg total) by mouth 2 (two) times daily. 60 capsule 1  . levothyroxine (SYNTHROID, LEVOTHROID) 75 MCG tablet TAKE 1 TABLET BY MOUTH EACH DAY ON EMPTYSTOMACH WITH A GLASS OF WATERAT LEAST 30 TO 60 MINUTES BEFORE BREAKFAST IN THE MORNING    . methotrexate 2.5 MG tablet Take 2.5 mg by mouth daily.     . nitroGLYCERIN (NITROSTAT) 0.4 MG SL tablet Place 0.4 mg under the tongue every 5 (five) minutes as needed.     . ondansetron (ZOFRAN) 8 MG tablet TAKE 1 TABLET BY MOUTH EVERY 12 HOURS ASNEEDED FOR NAUSEA    . pravastatin (PRAVACHOL) 10 MG tablet Take 10 mg by mouth daily.     . QUEtiapine (SEROQUEL) 100 MG tablet Take 1 tablet (100 mg total) by mouth at bedtime. 90 tablet 0  . ranitidine (ZANTAC) 150 MG tablet Take 150 mg by mouth 2 (two) times daily.      No current facility-administered medications for this visit.     Medical Decision Making:  Established Problem, Stable/Improving (1) and Review of Medication Regimen & Side Effects (2)  Treatment Plan Summary:Medication management and Plan We'll continue patient on her medications without  any changes.  Bipolar disorder-   Continue Neurontin 300 mg twice daily,  Continue tegretol at 300mg  po bid, Depakote 500 mg twice daily.  Continue Seroquel at 100 mg po qhs.  Return to clinic in 3 months. We will like check her labs at the time with carbamazepine level and Depakote level  She's been encouraged call any questions or concerns prior to her next appointment.  Jerrit Horen 04/15/2016, 10:52 AM

## 2016-04-16 ENCOUNTER — Ambulatory Visit: Payer: Medicare Other | Attending: Rheumatology | Admitting: Occupational Therapy

## 2016-04-16 DIAGNOSIS — M25541 Pain in joints of right hand: Secondary | ICD-10-CM | POA: Diagnosis present

## 2016-04-16 DIAGNOSIS — R278 Other lack of coordination: Secondary | ICD-10-CM | POA: Insufficient documentation

## 2016-04-16 DIAGNOSIS — M79641 Pain in right hand: Secondary | ICD-10-CM | POA: Diagnosis present

## 2016-04-16 NOTE — Therapy (Signed)
[facility] [address], [place], [address] Phone: [phone]   Fax:  [phone]  Occupational Therapy Treatment  Patient Details  Name: Stephanie [name] MRN: [id number] Date of Birth: 1945/11/20 Referring Provider: Dr Merita Norton  Encounter Date: 04/16/2016      OT End of Session - 04/16/16 1307    Visit Number 3   Number of Visits 4   Date for OT Re-Evaluation 04/29/16   OT Start Time 1122   OT Stop Time 1200   OT Time Calculation (min) 38 min   Activity Tolerance Patient tolerated treatment well   Behavior During Therapy [facility] for tasks assessed/performed      Past Medical History:  Diagnosis Date  . Anxiety   . Bipolar 1 disorder ([place])   . Bipolar disorder ([place])   . Depression   . Fatigue   . Fibromyalgia   . GERD (gastroesophageal reflux disease)   . Heart disease   . Heart failure ([place])   . HLD (hyperlipidemia)   . Hypothyroidism   . Pelvic pain in female   . Perianal lesion    high grade sil lesion- keratinizing type  . RA (rheumatoid arthritis) ([place])   . Raynaud phenomenon   . Sleep apnea   . Vaginal Pap smear, abnormal     Past Surgical History:  Procedure Laterality Date  . TONSILLECTOMY      There were no vitals filed for this visit.      Subjective Assessment - 04/16/16 1123    Subjective  Splint is doing okay - I am looking into getting smart phone -but I don't know what will be the best for my hands - I did bring my splint    Patient Stated Goals I want to prevent my my fingers to go sideways - I do not want surgery    Currently in Pain? Yes   Pain Score 3    Pain Location Hand   Pain Orientation Right   Pain Descriptors / Indicators Aching   Pain Type Chronic pain      Pt was wearing splint again with tabs in palm and pulling digits into UD  Reviewed 5 x with pt and provided written instructions for donning  Neoprene anti ulnar deviation splint - pt to gradually  increase from 30 min at time - to 2-3 hrs  Few times during day - in the next week  Check for skin issues in webspace  Pt demo donning and doffing  Correctly   Reviewed with pt  Tendon glides - need Max A and v/c for intrinsic fist  Opposition  RD of digits - with paper under 3rd digit 10 reps    Reviewed with pt use of hands using smart phone - had larger Samsung and 2  Iphones - with and without case Pt ed and assess using stylus - but recommend pt using larger one or switch between digits  Not using thumbs - and using case or pop socket to let it stand  If looking for reading her books needs stand or  tablet - pt had several questions again on options to help her hand pain  Info provided - pt need to discuss needs with family                            OT Education - 04/16/16 1306    Education provided Yes  Education Details AE and joint protetion and splint wearing   Person(s) Educated Patient   Methods Explanation;Demonstration;Tactile cues;Verbal cues   Comprehension Verbal cues required;Returned demonstration;Verbalized understanding          OT Short Term Goals - 04/01/16 1313      OT SHORT TERM GOAL #1   Title Pt wil be Mod I AROM/HEP ex's right hand   Baseline did not do it for few months    Time 2   Period Weeks   Status New           OT Long Term Goals - 04/01/16 1314      OT LONG TERM GOAL #1   Title Pt will be verbalizing into getting 1-3 AE to increase ease and take pressure of hands during ADL's   Baseline HOld book with lateral grip - increase UD of digits    Time 4   Period Weeks   Status New     OT LONG TERM GOAL #2   Title Pt to be ind in donning and doffing of  anti ulnar deviation splint - and wearing them with no issues    Baseline increase UD and decrease extention of 3rd digit on R hand    Time 4   Period Weeks   Status New               Plan - 04/16/16 1307    Clinical Impression Statement Pt  needed reed again on donning anti ulnar deviation splint correctly - and  use of smartphone or tablet and how to hold and use - as well as stylus use - and HEP - pt to return in 2 wks    Rehab Potential Good   OT Frequency 1x / week   OT Duration 2 weeks   OT Treatment/Interventions Self-care/ADL training;Splinting;Patient/family education;Therapeutic exercises;Parrafin;Manual Therapy   Plan assess splint wearing and use of AE and joint protection   OT Home Exercise Plan See pt instruction   Consulted and Agree with Plan of Care Patient      Patient will benefit from skilled therapeutic intervention in order to improve the following deficits and impairments:  Decreased range of motion, Impaired flexibility, Impaired UE functional use, Pain, Decreased knowledge of use of DME  Visit Diagnosis: Hand pain, right  Pain in joint of right hand  Other lack of coordination    Problem List Patient Active Problem List   Diagnosis Date Noted  . Chest pain 05/17/2015  . OP (osteoporosis) 02/25/2015  . Sjogren's syndrome ([place]) 02/20/2015  . Combined fat and carbohydrate induced hyperlipemia 10/03/2014  . Breathlessness on exertion 09/24/2014  . Bipolar 1 disorder with moderate mania ([place]) 08/29/2014  . Affective bipolar disorder ([place]) 08/29/2014  . H/O gastrointestinal disease 08/29/2014  . Anxiety, generalized 08/29/2014  . Fibromyalgia 08/29/2014  . H/O elevated lipids 08/29/2014  . H/O: hypothyroidism 08/29/2014  . Insomnia, persistent 08/29/2014  . 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 Chronic pain associated with significant psychosocial dysfunction 08/29/2014  . 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 Rheumatoid arthritis ([place]) 08/29/2014  . Benign essential HTN 01/10/2014  . 3-vessel CAD 01/10/2014  . Polypharmacy 06/29/2013  . Other long term (current) drug therapy 06/29/2013  . Arthritis or polyarthritis, rheumatoid  ([place]) 06/22/2013  . Acid reflux 05/05/2012  . Adult hypothyroidism 01/07/2012  . Flu vaccine need 01/07/2012  . Cough 12/14/2011  . CD (contact dermatitis) 10/15/2011  . Finger wound, simple, open 10/15/2011  Rosalyn Gess OTR/L,CLT 04/16/2016, 1:17 PM  [facility] [address] [place], [address] Phone: [phone]   Fax:  [phone]  Name: Stephanie [name] MRN: [id number] Date of Birth: 03-12-1945

## 2016-04-16 NOTE — Patient Instructions (Addendum)
Same

## 2016-05-05 ENCOUNTER — Ambulatory Visit: Payer: Medicare Other | Admitting: Occupational Therapy

## 2016-05-05 DIAGNOSIS — M25541 Pain in joints of right hand: Secondary | ICD-10-CM

## 2016-05-05 DIAGNOSIS — M79641 Pain in right hand: Secondary | ICD-10-CM

## 2016-05-05 DIAGNOSIS — R278 Other lack of coordination: Secondary | ICD-10-CM

## 2016-05-05 NOTE — Patient Instructions (Addendum)
Cont with her anti ulnar dev splint  HEP for ROM - tendon glides, opposition ,RD of digits  Gym 6 x day - including pool 3 days   Discuss and recommend not picking up to many exercises at gym - pt has history to over do act because of her Bipolar diagnosis  Pt do show understanding for using joint protection - using larger joints - carrying her bags and box this date - avoid tight and sustained grip

## 2016-05-05 NOTE — Therapy (Signed)
[facility] [address], [place], [address] Phone: [phone]   Fax:  [phone]  Occupational Therapy Treatment/discharge  Patient Details  Name: Stephanie [name] MRN: [id number] Date of Birth: [birth year] Referring Provider: Dr Merita Norton  Encounter Date: 05/05/2016      OT End of Session - 05/05/16 1550    Visit Number 4   Number of Visits 4   Date for OT Re-Evaluation 05/05/16   OT Start Time 1115   OT Stop Time 1200   OT Time Calculation (min) 45 min   Activity Tolerance Patient tolerated treatment well   Behavior During Therapy [facility] for tasks assessed/performed      Past Medical History:  Diagnosis Date  . Anxiety   . Bipolar 1 disorder ([place])   . Bipolar disorder ([place])   . Depression   . Fatigue   . Fibromyalgia   . GERD (gastroesophageal reflux disease)   . Heart disease   . Heart failure ([place])   . HLD (hyperlipidemia)   . Hypothyroidism   . Pelvic pain in female   . Perianal lesion    high grade sil lesion- keratinizing type  . RA (rheumatoid arthritis) ([place])   . Raynaud phenomenon   . Sleep apnea   . Vaginal Pap smear, abnormal     Past Surgical History:  Procedure Laterality Date  . TONSILLECTOMY      There were no vitals filed for this visit.      Subjective Assessment - 05/05/16 1119    Subjective  I did get android phone , and this new book stand - I liked this one better - it keeps my book better in place - My splint is fitting better - I can do it - look what you think     Patient Stated Goals I want to prevent my my fingers to go sideways - I do not want surgery    Currently in Pain? Yes   Pain Score 5    Pain Location Hand   Pain Orientation Right   Pain Descriptors / Indicators Aching   Pain Type Chronic pain   Pain Onset More than a month ago             Pt was able to donn  Anti ulnar deviation splint on correctly - except the last strap Few times  during day wearing with now issues per pt  Check for skin issues in webspace  Pt demo donning and doffing Correctly   Pt felt she can do her exercises for hands at home Join gym to do treadmill  and cont with pool 3 days -  Smart phone - reviewed holding correctly and using digits ,  Pt ed and assess using stylus - but recommend pt using larger one or switch between digits  Not using thumbs - and using case or pop socket to let it stand   Discuss and recommend not picking up to many exercises at gym - pt has history to over do act because of her Bipolar diagnosis  Pt do show understanding for using joint protection - using larger joints - carrying her bags and box this date - avoid tight and sustained grip                     OT Education - 05/05/16 1550    Education provided Yes   Education Details discharge instructions    Person(s) Educated Patient  Methods Explanation;Demonstration;Tactile cues;Verbal cues   Comprehension Verbal cues required;Returned demonstration;Verbalized understanding          OT Short Term Goals - 05/05/16 1554      OT SHORT TERM GOAL #1   Title Pt wil be Mod I AROM/HEP ex's right hand   Status Achieved     OT SHORT TERM GOAL #2   Title Pt will be Mod I stating 1-2 joint protection techniques that she could perform at home while doing ADL's to minimize stress and pain on joints   Status Achieved           OT Long Term Goals - 05/05/16 1554      OT LONG TERM GOAL #1   Title Pt will be verbalizing into getting 1-3 AE to increase ease and take pressure of hands during ADL's   Status Achieved     OT LONG TERM GOAL #2   Status Achieved     OT LONG TERM GOAL #3   Title Pt will report decreased pain during functional hand use as seen by pain rating as 3/10 or less   Status Achieved     OT LONG TERM GOAL #4   Title Pt will show improved Quick DASH rating as 50% or less   Status Deferred               Plan -  05/05/16 1551    Clinical Impression Statement Pt show donning and wearing of anti ulnar deviation splint correctly - report ind in HEP for ROM - pt got some AE to make act easier on her hands and show joint  protection - pt has program to cont with working out at pool or gym 6 x week - she has a diagnosis of Bipolar and history of  over schedule or book her self with act - discharge at this time   OT Treatment/Interventions Self-care/ADL training;Splinting;Patient/family education;Therapeutic exercises;Parrafin;Manual Therapy   Plan discharge with HEP    OT Home Exercise Plan See pt instruction   Consulted and Agree with Plan of Care Patient      Patient will benefit from skilled therapeutic intervention in order to improve the following deficits and impairments:     Visit Diagnosis: Hand pain, right - Plan: Ot plan of care cert/re-cert  Pain in joint of right hand - Plan: Ot plan of care cert/re-cert  Other lack of coordination - Plan: Ot plan of care cert/re-cert    Problem List Patient Active Problem List   Diagnosis Date Noted  . Chest pain 05/17/2015  . OP (osteoporosis) 02/25/2015  . Sjogren's syndrome ([place]) 02/20/2015  . Combined fat and carbohydrate induced hyperlipemia 10/03/2014  . Breathlessness on exertion 09/24/2014  . Bipolar 1 disorder with moderate mania ([place]) 08/29/2014  . Affective bipolar disorder ([place]) 08/29/2014  . H/O gastrointestinal disease 08/29/2014  . Anxiety, generalized 08/29/2014  . Fibromyalgia 08/29/2014  . H/O elevated lipids 08/29/2014  . H/O: hypothyroidism 08/29/2014  . Insomnia, persistent 08/29/2014  . 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 Chronic pain associated with significant psychosocial dysfunction 08/29/2014  . 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 Rheumatoid arthritis ([place]) 08/29/2014  . Benign essential HTN 01/10/2014  . 3-vessel CAD 01/10/2014  . Polypharmacy  06/29/2013  . Other long term (current) drug therapy 06/29/2013  . Arthritis or polyarthritis, rheumatoid ([place]) 06/22/2013  . Acid reflux 05/05/2012  . Adult hypothyroidism 01/07/2012  . Flu vaccine need 01/07/2012  . Cough 12/14/2011  .  CD (contact dermatitis) 10/15/2011  . Finger wound, simple, open 10/15/2011    Rosalyn Gess OTR/L,CLT 05/05/2016, 3:57 PM  [facility] [address] [place], [address] Phone: [phone]   Fax:  [phone]  Name: ALELI BEED MRN: [id number] Date of Birth: 04/06/1945

## 2016-05-14 ENCOUNTER — Telehealth: Payer: Self-pay

## 2016-05-14 NOTE — Telephone Encounter (Signed)
spoke with patient told pt to check her bottle that the last rx was for 100mg  pt thought she was taking 50mg .  pt was instructed to check her bottle and the mg on it.   Pt check her bottle and she stated that the bottle she had states 50mg  pt was told to call pharmacy because the last rx was 100mg .

## 2016-05-14 NOTE — Telephone Encounter (Signed)
pt called spt called states she needs to increase her seroquel to 100mg  that is manic.

## 2016-05-25 ENCOUNTER — Encounter: Payer: Self-pay | Admitting: Psychiatry

## 2016-05-25 ENCOUNTER — Ambulatory Visit (INDEPENDENT_AMBULATORY_CARE_PROVIDER_SITE_OTHER): Payer: 59 | Admitting: Psychiatry

## 2016-05-25 VITALS — BP 129/87 | HR 93 | Temp 98.1°F | Wt 123.2 lb

## 2016-05-25 DIAGNOSIS — F3110 Bipolar disorder, current episode manic without psychotic features, unspecified: Secondary | ICD-10-CM | POA: Diagnosis not present

## 2016-05-25 NOTE — Progress Notes (Signed)
Patient ID: Stephanie [name], female   DOB: [birth year], 71 y.o.   MRN: [id number] [facility] MD/PA/NP OP Progress Note  05/25/2016 10:11 AM Stephanie [name]  MRN:  [id number]  Subjective:  Patient returns for follow-up of her bipolar disorder and generalized anxiety disorder. Patient reports that she has been doing quite well mood wise. Reports that her friends have been concerned that she may be having some manic symptoms. She states that she is been sleeping well and eating well feet. Doing well overall. She has been busy with her new dog that she has had for a week. States she is also purchased a couple of items that she is been planning to for a long time. She has been getting acquainted with her smart phone and feels that her friends may have misconstrued her normal energy for her manic symptoms. She has also started doing her artwork. Overall she feels she is doing quite well and has been compliant with her medications.   Chief Complaint: doing well moodwise. Chief Complaint    Follow-up; Medication Refill; Medication Problem; Manic Behavior     Visit Diagnosis:     ICD-9-CM ICD-10-CM   1. Bipolar I disorder, most recent episode (or current) manic ([place]) 296.40 F31.10     Past Medical History:  Past Medical History:  Diagnosis Date  . Anxiety   . Bipolar 1 disorder ([place])   . Bipolar disorder ([place])   . Depression   . Fatigue   . Fibromyalgia   . GERD (gastroesophageal reflux disease)   . Heart disease   . Heart failure ([place])   . HLD (hyperlipidemia)   . Hypothyroidism   . Pelvic pain in female   . Perianal lesion    high grade sil lesion- keratinizing type  . RA (rheumatoid arthritis) ([place])   . Raynaud phenomenon   . Sleep apnea   . Vaginal Pap smear, abnormal     Past Surgical History:  Procedure Laterality Date  . TONSILLECTOMY     Family History:  Family History  Problem Relation Age of Onset  . Heart failure Father   . Parkinson's disease Mother   . Bipolar disorder Mother    . Depression Sister   . Schizophrenia Paternal Grandmother   . Diabetes Maternal Grandmother   . Pancreatic cancer Maternal Grandmother   . Cancer Neg Hx   . Heart disease Neg Hx    Social History:  Social History   Social History  . Marital status: Divorced    Spouse name: N/A  . Number of children: N/A  . Years of education: N/A   Social History Main Topics  . Smoking status: Never Smoker  . Smokeless tobacco: Former Systems developer  . Alcohol use No  . Drug use: No  . Sexual activity: No   Other Topics Concern  . None   Social History Narrative  . None   Additional History:   Assessment:   Musculoskeletal: Strength & Muscle Tone: within normal limits Gait & Station: unsteady, Walking with a cane Patient leans: N/A  Psychiatric Specialty Exam: Medication Refill     Review of Systems  Psychiatric/Behavioral: Negative for depression, hallucinations, memory loss, substance abuse and suicidal ideas. The patient is not nervous/anxious and does not have insomnia.   All other systems reviewed and are negative.   Blood pressure 129/87, pulse 93, temperature 98.1 F (36.7 C), temperature source Oral, weight 123 lb 3.2 oz (55.9 kg).Body mass index is 24.26 kg/m.  General Appearance: Neat and  Well Groomed  Eye Contact:  Good  Speech:  Normal Rate  Volume:  Normal  Mood: Stable   Affect:  Congruent  Thought Process:  Linear and Logical  Orientation:  Full (Time, Place, and Person)  Thought Content:  Negative  Suicidal Thoughts:  No  Homicidal Thoughts:  No  Memory:  Immediate;   Good Recent;   Good Remote;   Good  Judgement:  Good  Insight:  Good  Psychomotor Activity:  Negative  Concentration:  Good  Recall:  Good  Fund of Knowledge: Good  Language: Good  Akathisia:  Negative  Handed:  Right unknown   AIMS (if indicated):    Assets:  Communication Skills Desire for Improvement Social Support  ADL's:  Intact  Cognition: WNL  Sleep:  Improved significantly     Is the patient at risk to self?  No. Has the patient been a risk to self in the past 6 months?  No. Has the patient been a risk to self within the distant past?  No. Is the patient a risk to others?  No. Has the patient been a risk to others in the past 6 months?  No. Has the patient been a risk to others within the distant past?  No.  Current Medications: Current Outpatient Prescriptions  Medication Sig Dispense Refill  . aspirin 81 MG tablet Take 81 mg by mouth daily.     . Calcium Citrate-Vitamin D ([employer] CALCIUM CITRATE +D3 MINI) 200-250 MG-UNIT TABS Take 1 tablet by mouth daily.     . carbamazepine (TEGRETOL) 100 MG chewable tablet Take 6 chewable tablets by mouth at bedtime 540 tablet 0  . divalproex (DEPAKOTE ER) 500 MG 24 hr tablet Take 1 tablet (500 mg total) by mouth 2 (two) times daily. 449 tablet 1  . folic acid (FOLVITE) 1 MG tablet Take 1 mg by mouth daily.     Marland Kitchen gabapentin (NEURONTIN) 300 MG capsule Take 1 capsule (300 mg total) by mouth 2 (two) times daily. 60 capsule 2  . levothyroxine (SYNTHROID, LEVOTHROID) 75 MCG tablet TAKE 1 TABLET BY MOUTH EACH DAY ON EMPTYSTOMACH WITH A GLASS OF WATERAT LEAST 30 TO 60 MINUTES BEFORE BREAKFAST IN THE MORNING    . methotrexate 2.5 MG tablet Take 2.5 mg by mouth daily.     . nitroGLYCERIN (NITROSTAT) 0.4 MG SL tablet Place 0.4 mg under the tongue every 5 (five) minutes as needed.     . ondansetron (ZOFRAN) 8 MG tablet TAKE 1 TABLET BY MOUTH EVERY 12 HOURS ASNEEDED FOR NAUSEA    . pravastatin (PRAVACHOL) 10 MG tablet Take 10 mg by mouth daily.     . QUEtiapine (SEROQUEL) 100 MG tablet Take 1 tablet (100 mg total) by mouth at bedtime. 90 tablet 0  . ranitidine (ZANTAC) 150 MG tablet Take 150 mg by mouth 2 (two) times daily.      No current facility-administered medications for this visit.     Medical Decision Making:  Established Problem, Stable/Improving (1) and Review of Medication Regimen & Side Effects (2)  Treatment Plan  Summary:Medication management and Plan We'll continue patient on her medications without any changes.  Bipolar disorder-   Continue Neurontin 300 mg twice daily,  Continue tegretol at 300mg  po bid, Depakote 500 mg twice daily.  Continue Seroquel at 100 mg po qhs. We discussed monitoring her mood for any manic symptoms.  Return to clinic in 1 months. We will like check her labs at the time with carbamazepine level  and Depakote level, patient given a lab slip.  She's been encouraged call any questions or concerns prior to her next appointment.   Mayan Kloepfer 05/25/2016, 10:11 AM

## 2016-05-29 ENCOUNTER — Telehealth: Payer: Self-pay

## 2016-05-29 NOTE — Telephone Encounter (Signed)
Called left a message for patient to increase seroquel to 150mg  and to make sure she has a follow up appt in 2-3 weeks.

## 2016-05-29 NOTE — Telephone Encounter (Signed)
She can increase Seroquel to 150mg . I will need to see her in 2-3 weeks.

## 2016-05-29 NOTE — Telephone Encounter (Signed)
pt called states she is not sleeping very well pt states she going about 3 hours of sleep.  pt wanted to know if she can increase her seroquel to 150mg 

## 2016-06-01 NOTE — Telephone Encounter (Signed)
Okay 

## 2016-06-23 ENCOUNTER — Ambulatory Visit (INDEPENDENT_AMBULATORY_CARE_PROVIDER_SITE_OTHER): Payer: 59 | Admitting: Psychiatry

## 2016-06-23 DIAGNOSIS — F3110 Bipolar disorder, current episode manic without psychotic features, unspecified: Secondary | ICD-10-CM

## 2016-06-23 MED ORDER — CARBAMAZEPINE 100 MG PO CHEW: CHEWABLE_TABLET | ORAL | 0 refills | Status: DC

## 2016-06-23 MED ORDER — QUETIAPINE FUMARATE 100 MG PO TABS: 100.0000 mg | ORAL_TABLET | Freq: Every day | ORAL | 0 refills | Status: DC

## 2016-06-23 NOTE — Progress Notes (Signed)
Patient ID: Stephanie [name], female   DOB: 1945/03/13, 71 y.o.   MRN: [id number] [facility] MD/PA/NP OP Progress Note  06/23/2016 10:16 AM Stephanie [name]  MRN:  [id number]  Subjective:  Patient returns for follow-up of her bipolar disorder and generalized anxiety disorder. Patient reports that she has been doing quite well mood wise.  She states that she is been sleeping well and eating well feet. Doing well overall.  She is enjoying spending time with her new dog. She is also been working on her art work. States that she did not get her labs and will get them done in June since insurance pays twice a year.  Overall she feels she is doing quite well and has been compliant with her medications.   Chief Complaint: doing well moodwise.  Visit Diagnosis:     ICD-9-CM ICD-10-CM   1. Bipolar I disorder, most recent episode (or current) manic ([place]) 296.40 F31.10     Past Medical History:  Past Medical History:  Diagnosis Date  . Anxiety   . Bipolar 1 disorder ([place])   . Bipolar disorder ([place])   . Depression   . Fatigue   . Fibromyalgia   . GERD (gastroesophageal reflux disease)   . Heart disease   . Heart failure ([place])   . HLD (hyperlipidemia)   . Hypothyroidism   . Pelvic pain in female   . Perianal lesion    high grade sil lesion- keratinizing type  . RA (rheumatoid arthritis) ([place])   . Raynaud phenomenon   . Sleep apnea   . Vaginal Pap smear, abnormal     Past Surgical History:  Procedure Laterality Date  . TONSILLECTOMY     Family History:  Family History  Problem Relation Age of Onset  . Heart failure Father   . Parkinson's disease Mother   . Bipolar disorder Mother   . Depression Sister   . Schizophrenia Paternal Grandmother   . Diabetes Maternal Grandmother   . Pancreatic cancer Maternal Grandmother   . Cancer Neg Hx   . Heart disease Neg Hx    Social History:  Social History   Social History  . Marital status: Divorced    Spouse name: N/A  . Number of children: N/A   . Years of education: N/A   Social History Main Topics  . Smoking status: Never Smoker  . Smokeless tobacco: Former Systems developer  . Alcohol use No  . Drug use: No  . Sexual activity: No   Other Topics Concern  . Not on file   Social History Narrative  . No narrative on file   Additional History:   Assessment:   Musculoskeletal: Strength & Muscle Tone: within normal limits Gait & Station: unsteady, Walking with a cane Patient leans: N/A  Psychiatric Specialty Exam: Medication Refill     Review of Systems  Psychiatric/Behavioral: Negative for depression, hallucinations, memory loss, substance abuse and suicidal ideas. The patient is not nervous/anxious and does not have insomnia.   All other systems reviewed and are negative.   There were no vitals taken for this visit.There is no height or weight on file to calculate BMI.  General Appearance: Neat and Well Groomed  Eye Contact:  Good  Speech:  Normal Rate  Volume:  Normal  Mood: Stable   Affect:  Congruent  Thought Process:  Linear and Logical  Orientation:  Full (Time, Place, and Person)  Thought Content:  Negative  Suicidal Thoughts:  No  Homicidal Thoughts:  No  Memory:  Immediate;   Good Recent;   Good Remote;   Good  Judgement:  Good  Insight:  Good  Psychomotor Activity:  Negative  Concentration:  Good  Recall:  Good  Fund of Knowledge: Good  Language: Good  Akathisia:  Negative  Handed:  Right unknown   AIMS (if indicated):    Assets:  Communication Skills Desire for Improvement Social Support  ADL's:  Intact  Cognition: WNL  Sleep:  Improved significantly    Is the patient at risk to self?  No. Has the patient been a risk to self in the past 6 months?  No. Has the patient been a risk to self within the distant past?  No. Is the patient a risk to others?  No. Has the patient been a risk to others in the past 6 months?  No. Has the patient been a risk to others within the distant past?   No.  Current Medications: Current Outpatient Prescriptions  Medication Sig Dispense Refill  . aspirin 81 MG tablet Take 81 mg by mouth daily.     . Calcium Citrate-Vitamin D ([employer] CALCIUM CITRATE +D3 MINI) 200-250 MG-UNIT TABS Take 1 tablet by mouth daily.     . carbamazepine (TEGRETOL) 100 MG chewable tablet Take 6 chewable tablets by mouth at bedtime 540 tablet 0  . divalproex (DEPAKOTE ER) 500 MG 24 hr tablet Take 1 tablet (500 mg total) by mouth 2 (two) times daily. 962 tablet 1  . folic acid (FOLVITE) 1 MG tablet Take 1 mg by mouth daily.     Marland Kitchen gabapentin (NEURONTIN) 300 MG capsule Take 1 capsule (300 mg total) by mouth 2 (two) times daily. 60 capsule 2  . levothyroxine (SYNTHROID, LEVOTHROID) 75 MCG tablet TAKE 1 TABLET BY MOUTH EACH DAY ON EMPTYSTOMACH WITH A GLASS OF WATERAT LEAST 30 TO 60 MINUTES BEFORE BREAKFAST IN THE MORNING    . methotrexate 2.5 MG tablet Take 2.5 mg by mouth daily.     . nitroGLYCERIN (NITROSTAT) 0.4 MG SL tablet Place 0.4 mg under the tongue every 5 (five) minutes as needed.     . ondansetron (ZOFRAN) 8 MG tablet TAKE 1 TABLET BY MOUTH EVERY 12 HOURS ASNEEDED FOR NAUSEA    . pravastatin (PRAVACHOL) 10 MG tablet Take 10 mg by mouth daily.     . QUEtiapine (SEROQUEL) 100 MG tablet Take 1 tablet (100 mg total) by mouth at bedtime. 90 tablet 0  . ranitidine (ZANTAC) 150 MG tablet Take 150 mg by mouth 2 (two) times daily.      No current facility-administered medications for this visit.     Medical Decision Making:  Established Problem, Stable/Improving (1) and Review of Medication Regimen & Side Effects (2)  Treatment Plan Summary:Medication management and Plan We'll continue patient on her medications without any changes.  Bipolar disorder-   Continue Neurontin 300 mg twice daily,  Continue tegretol at 300mg  po bid, Depakote 500 mg twice daily.  Continue Seroquel at 100 mg po qhs. We discussed monitoring her mood for any manic symptoms.  Obtain labs in June  including Tegretol level and Depakote level.  Return to clinic in 3 months.   She's been encouraged call any questions or concerns prior to her next appointment.   Janis Sol 06/23/2016, 10:16 AM

## 2016-07-08 ENCOUNTER — Ambulatory Visit: Payer: 59 | Admitting: Psychiatry

## 2016-08-12 ENCOUNTER — Other Ambulatory Visit: Payer: Self-pay | Admitting: Psychiatry

## 2016-08-13 NOTE — Telephone Encounter (Signed)
Ok please call in for the gabapentin

## 2016-08-13 NOTE — Telephone Encounter (Signed)
pharmacy called they states pt needs a refill on gabapentin.  pt gets in a bubble wrap and they would like to have ready for tomorrow. Pt was last seen on  06-23-16 next appt  09-22-16

## 2016-08-13 NOTE — Telephone Encounter (Signed)
Called in oked for gabapentin #60 with no additional refills per dr. Einar Grad order.

## 2016-08-26 ENCOUNTER — Telehealth (HOSPITAL_COMMUNITY): Payer: Self-pay

## 2016-08-26 NOTE — Telephone Encounter (Signed)
Patient called me this morning because she was told that there were no doctors in the [place] office. Patient states that she used to take Seroquel 50 mg and in April Dr. Einar Grad increased to 100 mg due to a manic episode. Patient states that she is now feeling better and the 100 mg is just too much, it makes her very groggy. Patient asked if she could go back to 50 mg. I discussed with Dr. Lovena Le and he agreed that she could go to 50 mg but if her symptoms returned to go back up to the 100 mg. I relayed this information to the patient and she understood and agreed with this plan. Patient has a follow up appointment on 7/19.

## 2016-09-04 ENCOUNTER — Other Ambulatory Visit: Payer: Self-pay | Admitting: Psychiatry

## 2016-09-11 NOTE — Telephone Encounter (Signed)
pharmacy called they needs to get a refill on pt medication they are working on her pre package medications and they need a response as soon as possilbe. the pharmacst was told that there were no doctors in the office this afternoon and that it would have to wait until Monday.  He states that they are going to give her a emergency refill. I told them that I was not authorizing it and that the doctor would have to ok. His response was to send doctor a message that an emergency supply was sent in and that the doctor needs to send in rx   Pt next appt is 09-22-16 and last seen on  06-23-16

## 2016-09-14 NOTE — Telephone Encounter (Signed)
Is it for both meds?

## 2016-09-22 ENCOUNTER — Encounter: Payer: Self-pay | Admitting: Psychiatry

## 2016-09-22 ENCOUNTER — Ambulatory Visit (INDEPENDENT_AMBULATORY_CARE_PROVIDER_SITE_OTHER): Payer: 59 | Admitting: Psychiatry

## 2016-09-22 VITALS — BP 115/85 | HR 77 | Temp 97.5°F | Wt 102.6 lb

## 2016-09-22 DIAGNOSIS — F3110 Bipolar disorder, current episode manic without psychotic features, unspecified: Secondary | ICD-10-CM

## 2016-09-22 MED ORDER — DIVALPROEX SODIUM ER 500 MG PO TB24: 500.0000 mg | ORAL_TABLET | Freq: Two times a day (BID) | ORAL | 1 refills | Status: DC

## 2016-09-22 MED ORDER — QUETIAPINE FUMARATE 50 MG PO TABS: 50.0000 mg | ORAL_TABLET | Freq: Every day | ORAL | 1 refills | Status: DC

## 2016-09-22 MED ORDER — GABAPENTIN 300 MG PO CAPS: 300.0000 mg | ORAL_CAPSULE | Freq: Two times a day (BID) | ORAL | 2 refills | Status: DC

## 2016-09-22 MED ORDER — CARBAMAZEPINE 100 MG PO CHEW: CHEWABLE_TABLET | ORAL | 0 refills | Status: DC

## 2016-09-22 NOTE — Progress Notes (Signed)
Patient ID: Stephanie [name], female   DOB: 11/08/1945, 71 y.o.   MRN: [id number] [facility] MD/PA/NP OP Progress Note  09/22/2016 10:10 AM Stephanie [name]  MRN:  [id number]  Subjective:  Patient returns for follow-up of her bipolar disorder and generalized anxiety disorder. Patient reports that she has been doing quite well mood wise.  She states that she is been sleeping well and eating well feet. Doing well overall.  She has decreased the seroquel to 50mg , feels this is working well for her during the season. She continues to enjoy her dog. She has been working on some of her art as well. Overall reports doing well.  Chief Complaint: doing well moodwise.  Visit Diagnosis:     ICD-10-CM   1. Bipolar I disorder, most recent episode (or current) manic ([place]) F31.10     Past Medical History:  Past Medical History:  Diagnosis Date  . Anxiety   . Bipolar 1 disorder ([place])   . Bipolar disorder ([place])   . Depression   . Fatigue   . Fibromyalgia   . GERD (gastroesophageal reflux disease)   . Heart disease   . Heart failure ([place])   . HLD (hyperlipidemia)   . Hypothyroidism   . Pelvic pain in female   . Perianal lesion    high grade sil lesion- keratinizing type  . RA (rheumatoid arthritis) ([place])   . Raynaud phenomenon   . Sleep apnea   . Vaginal Pap smear, abnormal     Past Surgical History:  Procedure Laterality Date  . TONSILLECTOMY     Family History:  Family History  Problem Relation Age of Onset  . Heart failure Father   . Parkinson's disease Mother   . Bipolar disorder Mother   . Depression Sister   . Schizophrenia Paternal Grandmother   . Diabetes Maternal Grandmother   . Pancreatic cancer Maternal Grandmother   . Cancer Neg Hx   . Heart disease Neg Hx    Social History:  Social History   Social History  . Marital status: Divorced    Spouse name: N/A  . Number of children: N/A  . Years of education: N/A   Social History Main Topics  . Smoking status: Never Smoker  .  Smokeless tobacco: Former Systems developer  . Alcohol use No  . Drug use: No  . Sexual activity: No   Other Topics Concern  . Not on file   Social History Narrative  . No narrative on file   Additional History:   Assessment:   Musculoskeletal: Strength & Muscle Tone: within normal limits Gait & Station: unsteady, Walking with a cane Patient leans: N/A  Psychiatric Specialty Exam: Medication Refill     Review of Systems  Psychiatric/Behavioral: Negative for depression, hallucinations, memory loss, substance abuse and suicidal ideas. The patient is not nervous/anxious and does not have insomnia.   All other systems reviewed and are negative.   There were no vitals taken for this visit.There is no height or weight on file to calculate BMI.  General Appearance: Neat and Well Groomed  Eye Contact:  Good  Speech:  Normal Rate  Volume:  Normal  Mood: Stable   Affect:  Congruent  Thought Process:  Linear and Logical  Orientation:  Full (Time, Place, and Person)  Thought Content:  Negative  Suicidal Thoughts:  No  Homicidal Thoughts:  No  Memory:  Immediate;   Good Recent;   Good Remote;   Good  Judgement:  Good  Insight:  Good  Psychomotor Activity:  Negative  Concentration:  Good  Recall:  Good  Fund of Knowledge: Good  Language: Good  Akathisia:  Negative  Handed:  Right unknown   AIMS (if indicated):    Assets:  Communication Skills Desire for Improvement Social Support  ADL's:  Intact  Cognition: WNL  Sleep:  Improved significantly    Is the patient at risk to self?  No. Has the patient been a risk to self in the past 6 months?  No. Has the patient been a risk to self within the distant past?  No. Is the patient a risk to others?  No. Has the patient been a risk to others in the past 6 months?  No. Has the patient been a risk to others within the distant past?  No.  Current Medications: Current Outpatient Prescriptions  Medication Sig Dispense Refill  . aspirin  81 MG tablet Take 81 mg by mouth daily.     . Calcium Citrate-Vitamin D ([employer]S CALCIUM CITRATE +D3 MINI) 200-250 MG-UNIT TABS Take 1 tablet by mouth daily.     . carbamazepine (TEGRETOL) 100 MG chewable tablet Take 6 chewable tablets by mouth at bedtime 540 tablet 0  . divalproex (DEPAKOTE ER) 500 MG 24 hr tablet Take 1 tablet (500 mg total) by mouth 2 (two) times daily. 275 tablet 1  . folic acid (FOLVITE) 1 MG tablet Take 1 mg by mouth daily.     Marland Kitchen gabapentin (NEURONTIN) 300 MG capsule Take 1 capsule (300 mg total) by mouth 2 (two) times daily. 60 capsule 2  . levothyroxine (SYNTHROID, LEVOTHROID) 75 MCG tablet TAKE 1 TABLET BY MOUTH EACH DAY ON EMPTYSTOMACH WITH A GLASS OF WATERAT LEAST 30 TO 60 MINUTES BEFORE BREAKFAST IN THE MORNING    . methotrexate 2.5 MG tablet Take 2.5 mg by mouth daily.     . nitroGLYCERIN (NITROSTAT) 0.4 MG SL tablet Place 0.4 mg under the tongue every 5 (five) minutes as needed.     . ondansetron (ZOFRAN) 8 MG tablet TAKE 1 TABLET BY MOUTH EVERY 12 HOURS ASNEEDED FOR NAUSEA    . pravastatin (PRAVACHOL) 10 MG tablet Take 10 mg by mouth daily.     . QUEtiapine (SEROQUEL) 100 MG tablet Take 1 tablet (100 mg total) by mouth at bedtime. 90 tablet 0  . ranitidine (ZANTAC) 150 MG tablet Take 150 mg by mouth 2 (two) times daily.      No current facility-administered medications for this visit.     Medical Decision Making:  Established Problem, Stable/Improving (1) and Review of Medication Regimen & Side Effects (2)  Treatment Plan Summary:Medication management and Plan We'll continue patient on her medications without any changes.  Bipolar disorder-   Continue Neurontin 300 mg twice daily,  Continue tegretol at 300mg  po bid, Depakote 500 mg twice daily.  decrease Seroquel to 50 mg po qhs. We discussed monitoring her mood for any manic symptoms.  Patient obtained her labs in June and Depakote level within therapeutic range at the 83 and the carbamazepine level in  therapeutic range at 7.2   Return to clinic in 3 months.   She's been encouraged call any questions or concerns prior to her next appointment.   Tylen Leverich 09/22/2016, 10:10 AM

## 2016-09-22 NOTE — Telephone Encounter (Signed)
Pt was seen today.

## 2016-09-23 NOTE — Telephone Encounter (Signed)
I sent her meds to the pharmacy.

## 2016-11-10 ENCOUNTER — Other Ambulatory Visit: Payer: Self-pay | Admitting: Psychiatry

## 2016-11-16 ENCOUNTER — Telehealth: Payer: Self-pay

## 2016-11-16 NOTE — Telephone Encounter (Signed)
pt called stated that she is feeling manic over the weekend so she increase her seroquel from 50mg  to 100mg . and she feel like it needs to go up to 150mg  . pt was made an appt for wednesday. pt states she is going on a trip on 12-06-16 to 12-09[place]16 and she would like to get this straighten out before her trip. Pt was told that dr. Einar Grad was out of the office today 11-16-16 and could make appt for Tuesday but pt states she already had a appt somewhere else for Tuesday so we ended up making appt for wed. Pt was advised to go to er but she states that she just increase her medications her self and feels better since the increase but feels that the 150 would be better.

## 2016-11-17 NOTE — Telephone Encounter (Signed)
ok 

## 2016-11-18 ENCOUNTER — Ambulatory Visit (INDEPENDENT_AMBULATORY_CARE_PROVIDER_SITE_OTHER): Payer: 59 | Admitting: Psychiatry

## 2016-11-18 ENCOUNTER — Encounter: Payer: Self-pay | Admitting: Psychiatry

## 2016-11-18 VITALS — BP 144/87 | HR 67 | Temp 98.7°F | Wt 104.8 lb

## 2016-11-18 DIAGNOSIS — F3110 Bipolar disorder, current episode manic without psychotic features, unspecified: Secondary | ICD-10-CM | POA: Diagnosis not present

## 2016-11-18 MED ORDER — DIVALPROEX SODIUM ER 500 MG PO TB24: 500.0000 mg | ORAL_TABLET | Freq: Two times a day (BID) | ORAL | 1 refills | Status: DC

## 2016-11-18 MED ORDER — QUETIAPINE FUMARATE 100 MG PO TABS: 100.0000 mg | ORAL_TABLET | Freq: Every day | ORAL | 1 refills | Status: DC

## 2016-11-18 MED ORDER — CARBAMAZEPINE 200 MG PO TABS: 400.0000 mg | ORAL_TABLET | Freq: Two times a day (BID) | ORAL | 2 refills | Status: DC

## 2016-11-18 NOTE — Progress Notes (Signed)
Patient ID: Stephanie [name], female   DOB: Apr 05, 1945, 71 y.o.   MRN: [id number] [facility] MD/PA/NP OP Progress Note  11/18/2016 11:06 AM LETISIA SCHWALB  MRN:  [id number]  Subjective:  Patient returns for follow-up of her bipolar disorder and generalized anxiety disorder. Patient today reports that she has been feeling more manic over the past month. She tried to increase the Seroquel 200 which she stated stated was good but then took 150 of the previous night. States that she felt somewhat dizzy and had to hold the railing for support. She also is reporting some blurred vision. We discussed that high doses of Seroquel can cause blurred vision and also increase in blood pressure. Her blood pressure today is higher than normal per patient. She was observed to be talking in a loud voice and talking continuously. We addressed this as well. And she is agreeable that down she has been manic. We discussed making some med changes.   Chief Complaint: most recently manic Chief Complaint    Follow-up; Medication Refill; Diplopia     Visit Diagnosis:     ICD-10-CM   1. Bipolar I disorder, most recent episode (or current) manic ([place]) F31.10     Past Medical History:  Past Medical History:  Diagnosis Date  . Anxiety   . Bipolar 1 disorder ([place])   . Bipolar disorder ([place])   . Depression   . Fatigue   . Fibromyalgia   . GERD (gastroesophageal reflux disease)   . Heart disease   . Heart failure ([place])   . HLD (hyperlipidemia)   . Hypothyroidism   . Pelvic pain in female   . Perianal lesion    high grade sil lesion- keratinizing type  . RA (rheumatoid arthritis) ([place])   . Raynaud phenomenon   . Sleep apnea   . Vaginal Pap smear, abnormal     Past Surgical History:  Procedure Laterality Date  . TONSILLECTOMY     Family History:  Family History  Problem Relation Age of Onset  . Heart failure Father   . Parkinson's disease Mother   . Bipolar disorder Mother   . Depression Sister   .  Schizophrenia Paternal Grandmother   . Diabetes Maternal Grandmother   . Pancreatic cancer Maternal Grandmother   . Cancer Neg Hx   . Heart disease Neg Hx    Social History:  Social History   Social History  . Marital status: Divorced    Spouse name: N/A  . Number of children: N/A  . Years of education: N/A   Social History Main Topics  . Smoking status: Never Smoker  . Smokeless tobacco: Former Systems developer  . Alcohol use No  . Drug use: No  . Sexual activity: No   Other Topics Concern  . None   Social History Narrative  . None   Additional History:   Assessment:   Musculoskeletal: Strength & Muscle Tone: within normal limits Gait & Station: unsteady, Walking with a cane Patient leans: N/A  Psychiatric Specialty Exam: Medication Refill     Review of Systems  Psychiatric/Behavioral: Negative for depression, hallucinations, memory loss, substance abuse and suicidal ideas. The patient is not nervous/anxious and does not have insomnia.   All other systems reviewed and are negative.   Blood pressure (!) 144/87, pulse 67, temperature 98.7 F (37.1 C), temperature source Oral, weight 104 lb 12.8 oz (47.5 kg).Body mass index is 20.64 kg/m.  General Appearance: Neat and Well Groomed  Eye Contact:  Good  Speech:  Normal Rate  Volume:  loud  Mood: mild mania  Affect:  Congruent  Thought Process:  Linear and Logical  Orientation:  Full (Time, Place, and Person)  Thought Content:  Negative  Suicidal Thoughts:  No  Homicidal Thoughts:  No  Memory:  Immediate;   Good Recent;   Good Remote;   Good  Judgement:  Good  Insight:  Good  Psychomotor Activity:  Negative  Concentration:  Good  Recall:  Good  Fund of Knowledge: Good  Language: Good  Akathisia:  Negative  Handed:  Right unknown   AIMS (if indicated):    Assets:  Communication Skills Desire for Improvement Social Support  ADL's:  Intact  Cognition: WNL  Sleep:  Was disturbed   Is the patient at risk to  self?  No. Has the patient been a risk to self in the past 6 months?  No. Has the patient been a risk to self within the distant past?  No. Is the patient a risk to others?  No. Has the patient been a risk to others in the past 6 months?  No. Has the patient been a risk to others within the distant past?  No.  Current Medications: Current Outpatient Prescriptions  Medication Sig Dispense Refill  . aspirin 81 MG tablet Take 81 mg by mouth daily.     . Calcium Citrate-Vitamin D (CVS CALCIUM CITRATE +D3 MINI) 200-250 MG-UNIT TABS Take 1 tablet by mouth daily.     . carbamazepine (TEGRETOL) 100 MG chewable tablet Take 6 chewable tablets by mouth at bedtime 540 tablet 0  . divalproex (DEPAKOTE ER) 500 MG 24 hr tablet Take 1 tablet (500 mg total) by mouth 2 (two) times daily. 315 tablet 1  . folic acid (FOLVITE) 1 MG tablet Take 1 mg by mouth daily.     Marland Kitchen gabapentin (NEURONTIN) 300 MG capsule Take 1 capsule (300 mg total) by mouth 2 (two) times daily. 60 capsule 2  . levothyroxine (SYNTHROID, LEVOTHROID) 75 MCG tablet TAKE 1 TABLET BY MOUTH EACH DAY ON EMPTYSTOMACH WITH A GLASS OF WATERAT LEAST 30 TO 60 MINUTES BEFORE BREAKFAST IN THE MORNING    . methotrexate 2.5 MG tablet Take 2.5 mg by mouth daily.     . nitroGLYCERIN (NITROSTAT) 0.4 MG SL tablet Place 0.4 mg under the tongue every 5 (five) minutes as needed.     . ondansetron (ZOFRAN) 8 MG tablet TAKE 1 TABLET BY MOUTH EVERY 12 HOURS ASNEEDED FOR NAUSEA    . pravastatin (PRAVACHOL) 10 MG tablet Take 10 mg by mouth daily.     . QUEtiapine (SEROQUEL) 50 MG tablet Take 1 tablet (50 mg total) by mouth at bedtime. 90 tablet 1  . ranitidine (ZANTAC) 150 MG tablet Take 150 mg by mouth 2 (two) times daily.      No current facility-administered medications for this visit.     Medical Decision Making:  Established Problem, Stable/Improving (1) and Review of Medication Regimen & Side Effects (2)  Treatment Plan Summary:Medication management and Plan  We'll continue patient on her medications without any changes.  Bipolar disorder-   Continue Neurontin 300 mg twice daily Increase tegretol to 400mg  po bid,  Depakote 500 mg twice daily.  Continue Seroquel at 100 mg po qhs. We discussed monitoring her for side effects and improvement in manic symptoms. Patient urged not to increase the Seroquel any further.  Return to clinic in 3 months. Patient urged to be careful when driving. We will  like check her labs at the time with carbamazepine level and Depakote level, patient given a lab slip.  She's been encouraged call any questions or concerns prior to her next appointment.   Kamaron Deskins 11/18/2016, 11:06 AM

## 2016-11-24 ENCOUNTER — Telehealth: Payer: Self-pay

## 2016-11-24 ENCOUNTER — Encounter: Payer: Self-pay | Admitting: Psychiatry

## 2016-11-24 ENCOUNTER — Ambulatory Visit (INDEPENDENT_AMBULATORY_CARE_PROVIDER_SITE_OTHER): Payer: 59 | Admitting: Psychiatry

## 2016-11-24 VITALS — BP 129/76 | HR 77 | Wt 106.4 lb

## 2016-11-24 DIAGNOSIS — F3110 Bipolar disorder, current episode manic without psychotic features, unspecified: Secondary | ICD-10-CM | POA: Diagnosis not present

## 2016-11-24 MED ORDER — TRAZODONE HCL 50 MG PO TABS: 50.0000 mg | ORAL_TABLET | Freq: Every day | ORAL | 1 refills | Status: DC

## 2016-11-24 NOTE — Telephone Encounter (Signed)
Ok, patient has appointment today

## 2016-11-24 NOTE — Progress Notes (Signed)
Patient ID: Stephanie [name], female   DOB: [birth year], 71 y.o.   MRN: [id number] [facility] MD/PA/NP OP Progress Note  11/24/2016 9:39 AM Stephanie [name]  MRN:  [id number]  Subjective:  Patient returns for follow-up of her bipolar disorder and generalized anxiety disorder. Patient reports she is having some side effects. States she has dizziness and had 2 falls. Reports nausea and insomnia. She reports this has not happened previously. We just increased her Tegretol to higher dosage last week. She has been taking the Seroquel at 100 mg. We looked into drug interactions and Seroquel can raise the levels of Tegretol which can in [place] 2 dizziness and nausea. We discussed discontinuing the Seroquel and starting her on trazodone to help her sleep if needed. Patient is going on a vacation next week and we discussed that down she will need to go to the ER if she continues to have these symptoms. Patient also recommended to see her primary care physician to rule out other reasons.  Chief Complaint: most recently manic  Visit Diagnosis:     ICD-10-CM   1. Bipolar I disorder, most recent episode (or current) manic ([place]) F31.10     Past Medical History:  Past Medical History:  Diagnosis Date  . Anxiety   . Bipolar 1 disorder ([place])   . Bipolar disorder ([place])   . Depression   . Fatigue   . Fibromyalgia   . GERD (gastroesophageal reflux disease)   . Heart disease   . Heart failure ([place])   . HLD (hyperlipidemia)   . Hypothyroidism   . Pelvic pain in female   . Perianal lesion    high grade sil lesion- keratinizing type  . RA (rheumatoid arthritis) ([place])   . Raynaud phenomenon   . Sleep apnea   . Vaginal Pap smear, abnormal     Past Surgical History:  Procedure Laterality Date  . TONSILLECTOMY     Family History:  Family History  Problem Relation Age of Onset  . Heart failure Father   . Parkinson's disease Mother   . Bipolar disorder Mother   . Depression Sister   . Schizophrenia Paternal  Grandmother   . Diabetes Maternal Grandmother   . Pancreatic cancer Maternal Grandmother   . Cancer Neg Hx   . Heart disease Neg Hx    Social History:  Social History   Social History  . Marital status: Divorced    Spouse name: N/A  . Number of children: N/A  . Years of education: N/A   Social History Main Topics  . Smoking status: Never Smoker  . Smokeless tobacco: Former Systems developer  . Alcohol use No  . Drug use: No  . Sexual activity: No   Other Topics Concern  . Not on file   Social History Narrative  . No narrative on file   Additional History:   Assessment:   Musculoskeletal: Strength & Muscle Tone: within normal limits Gait & Station: unsteady, Walking with a cane Patient leans: N/A  Psychiatric Specialty Exam: Medication Refill     Review of Systems  Psychiatric/Behavioral: Negative for depression, hallucinations, memory loss, substance abuse and suicidal ideas. The patient is not nervous/anxious and does not have insomnia.   All other systems reviewed and are negative.   There were no vitals taken for this visit.There is no height or weight on file to calculate BMI.  General Appearance: Neat and Well Groomed  Eye Contact:  Good  Speech:  Normal Rate  Volume:  loud  Mood: mild mania  Affect:  Congruent  Thought Process:  Linear and Logical  Orientation:  Full (Time, Place, and Person)  Thought Content:  Negative  Suicidal Thoughts:  No  Homicidal Thoughts:  No  Memory:  Immediate;   Good Recent;   Good Remote;   Good  Judgement:  Good  Insight:  Good  Psychomotor Activity:  Negative  Concentration:  Good  Recall:  Good  Fund of Knowledge: Good  Language: Good  Akathisia:  Negative  Handed:  Right unknown   AIMS (if indicated):    Assets:  Communication Skills Desire for Improvement Social Support  ADL's:  Intact  Cognition: WNL  Sleep:  Was disturbed   Is the patient at risk to self?  No. Has the patient been a risk to self in the past 6  months?  No. Has the patient been a risk to self within the distant past?  No. Is the patient a risk to others?  No. Has the patient been a risk to others in the past 6 months?  No. Has the patient been a risk to others within the distant past?  No.  Current Medications: Current Outpatient Prescriptions  Medication Sig Dispense Refill  . aspirin 81 MG tablet Take 81 mg by mouth daily.     . Calcium Citrate-Vitamin D ([employer] CALCIUM CITRATE +D3 MINI) 200-250 MG-UNIT TABS Take 1 tablet by mouth daily.     . carbamazepine (TEGRETOL) 200 MG tablet Take 2 tablets (400 mg total) by mouth 2 times daily at 12 noon and 4 pm. 120 tablet 2  . divalproex (DEPAKOTE ER) 500 MG 24 hr tablet Take 1 tablet (500 mg total) by mouth 2 (two) times daily. 60 tablet 1  . folic acid (FOLVITE) 1 MG tablet Take 1 mg by mouth daily.     Marland Kitchen gabapentin (NEURONTIN) 300 MG capsule Take 1 capsule (300 mg total) by mouth 2 (two) times daily. 60 capsule 2  . levothyroxine (SYNTHROID, LEVOTHROID) 75 MCG tablet TAKE 1 TABLET BY MOUTH EACH DAY ON EMPTYSTOMACH WITH A GLASS OF WATERAT LEAST 30 TO 60 MINUTES BEFORE BREAKFAST IN THE MORNING    . methotrexate 2.5 MG tablet Take 2.5 mg by mouth daily.     . nitroGLYCERIN (NITROSTAT) 0.4 MG SL tablet Place 0.4 mg under the tongue every 5 (five) minutes as needed.     . ondansetron (ZOFRAN) 8 MG tablet TAKE 1 TABLET BY MOUTH EVERY 12 HOURS ASNEEDED FOR NAUSEA    . pravastatin (PRAVACHOL) 10 MG tablet Take 10 mg by mouth daily.     . QUEtiapine (SEROQUEL) 100 MG tablet Take 1 tablet (100 mg total) by mouth at bedtime. 30 tablet 1  . ranitidine (ZANTAC) 150 MG tablet Take 150 mg by mouth 2 (two) times daily.      No current facility-administered medications for this visit.     Medical Decision Making:  Established Problem, Stable/Improving (1) and Review of Medication Regimen & Side Effects (2)  Treatment Plan Summary:Medication management and Plan We'll continue patient on her  medications without any changes.  Bipolar disorder-   Continue Neurontin 300 mg twice daily continue tegretol to 400mg  po bid,  Depakote 500 mg twice daily. Discontinue Seroquel We discussed monitoring her for side effects and improvement in manic symptoms.  Return to clinic in 2 weeks. Patient urged to be careful when driving. We will check her labs at the time with carbamazepine level and Depakote level, patient given a lab  slip.  She's been encouraged call any questions or concerns prior to her next appointment. Patient aware that this clinician will be away next week. She continues to have trouble with the nausea and dizziness she is to follow-up with her primary care clinician and rule out other reasons for these symptoms. If her manic symptoms continue she is to go to the ER or call 911.   Stephanie [name] 11/24/2016, 9:39 AM

## 2016-11-24 NOTE — Telephone Encounter (Signed)
pt called after hours and left a message with answering services pt states she having trouble falling asleep for the past several days. having mania with her bipolar disorder. she had some medications changes and thinks that maybe the problem.   Please advise

## 2016-12-03 ENCOUNTER — Other Ambulatory Visit: Payer: Self-pay | Admitting: Ophthalmology

## 2016-12-03 DIAGNOSIS — H532 Diplopia: Secondary | ICD-10-CM

## 2016-12-09 ENCOUNTER — Telehealth: Payer: Self-pay

## 2016-12-09 NOTE — Telephone Encounter (Signed)
pt called states she is on her trip. pt states that she believes the tegretol is giving her frequent urination which is becoming a issues while travling.

## 2016-12-09 NOTE — Telephone Encounter (Signed)
called and left message for pt to call office back to get more details. I looked up side effects of the tegretor and did not see that as a side effect. Wanted to get more info from patient if possible.

## 2016-12-10 NOTE — Telephone Encounter (Signed)
PT WAS CALLED AND TOLD THAT DR. RAVI ADVISED TO CONTACT PCP THAT. PT STATED THAT SHE WOULD CALL HER PCP  AND MAKE AN APPT

## 2016-12-10 NOTE — Telephone Encounter (Signed)
She will need to follow-up with her primary care physician on this

## 2016-12-10 NOTE — Telephone Encounter (Signed)
Frequent unirnation

## 2016-12-10 NOTE — Telephone Encounter (Signed)
What side effect is patient talking about

## 2016-12-16 ENCOUNTER — Encounter: Payer: Self-pay | Admitting: Psychiatry

## 2016-12-16 ENCOUNTER — Ambulatory Visit (INDEPENDENT_AMBULATORY_CARE_PROVIDER_SITE_OTHER): Payer: Medicare Other | Admitting: Psychiatry

## 2016-12-16 VITALS — BP 126/84 | HR 66 | Temp 97.6°F | Wt 101.8 lb

## 2016-12-16 DIAGNOSIS — G4700 Insomnia, unspecified: Secondary | ICD-10-CM | POA: Diagnosis not present

## 2016-12-16 DIAGNOSIS — F3110 Bipolar disorder, current episode manic without psychotic features, unspecified: Secondary | ICD-10-CM

## 2016-12-16 MED ORDER — CARBAMAZEPINE 200 MG PO TABS: 400.0000 mg | ORAL_TABLET | Freq: Two times a day (BID) | ORAL | 2 refills | Status: DC

## 2016-12-16 MED ORDER — DIVALPROEX SODIUM ER 500 MG PO TB24: 500.0000 mg | ORAL_TABLET | Freq: Two times a day (BID) | ORAL | 1 refills | Status: DC

## 2016-12-16 MED ORDER — TRAZODONE HCL 50 MG PO TABS: 50.0000 mg | ORAL_TABLET | Freq: Every day | ORAL | 1 refills | Status: DC

## 2016-12-16 NOTE — Progress Notes (Signed)
Patient ID: Stephanie [name], female   DOB: December 31, 1945, 71 y.o.   MRN: [id number] [facility] MD/PA/NP OP Progress Note  12/16/2016 10:54 AM Stephanie [name]  MRN:  [id number]  Subjective:  Patient returns for follow-up of her bipolar disorder and generalized anxiety disorder. Patient today walks into the clinic with the help of a cane. She reports that though she has not had any falls she has been worried about falling and has been using a cane for balance. States that her dizziness has improved significantly since stopping the Seroquel. She also reports that she has enjoyed her vacation with her friends to [place]. States that she feels rejuvenated emotionally, physically and spiritually. Patient appears to have hyperverbal speech and moving from topic to topic. We discussed this and she reports that she may be slightly manic but not like the last time. She does not recall having been given a lab slip to check her Depakote and Tegretol levels. She reports sleeping well with the help of the trazodone. She is enjoying her time. She is been spending time with friends and watching movies. She also reports being somewhat apprehensive about getting an MRI tomorrow.   Chief Complaint: most recently manic Chief Complaint    Follow-up; Medication Refill     Visit Diagnosis:     ICD-10-CM   1. Bipolar I disorder, most recent episode (or current) manic ([place]) F31.10   2. 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 Anxiety   . Bipolar 1 disorder ([place])   . Bipolar disorder ([place])   . Depression   . Fatigue   . Fibromyalgia   . GERD (gastroesophageal reflux disease)   . Heart disease   . Heart failure ([place])   . HLD (hyperlipidemia)   . Hypothyroidism   . Pelvic pain in female   . Perianal lesion    high grade sil lesion- keratinizing type  . RA (rheumatoid arthritis) ([place])   . Raynaud phenomenon   . Sleep apnea   .  Vaginal Pap smear, abnormal     Past Surgical History:  Procedure Laterality Date  . TONSILLECTOMY     Family History:  Family History  Problem Relation Age of Onset  . Heart failure Father   . Parkinson's disease Mother   . Bipolar disorder Mother   . Depression Sister   . Schizophrenia Paternal Grandmother   . Diabetes Maternal Grandmother   . Pancreatic cancer Maternal Grandmother   . Cancer Neg Hx   . Heart disease Neg Hx    Social History:  Social History   Social History  . Marital status: Divorced    Spouse name: N/A  . Number of children: N/A  . Years of education: N/A   Social History Main Topics  . Smoking status: Never Smoker  . Smokeless tobacco: Former Systems developer  . Alcohol use No  . Drug use: No  . Sexual activity: No   Other Topics Concern  . None   Social History Narrative  . None   Additional History:   Assessment:   Musculoskeletal: Strength & Muscle Tone: within normal limits Gait & Station: unsteady, Walking with a cane Patient leans: N/A  Psychiatric Specialty Exam: Medication Refill     Review of Systems  Psychiatric/Behavioral: Negative for depression, hallucinations, memory loss, substance abuse and suicidal ideas. The patient is not nervous/anxious and does not have insomnia.   All other  systems reviewed and are negative.   Blood pressure 126/84, pulse 66, temperature 97.6 F (36.4 C), temperature source Oral, weight 101 lb 12.8 oz (46.2 kg).Body mass index is 20.05 kg/m.  General Appearance: Neat and Well Groomed  Eye Contact:  Good  Speech:  Patient quickly moving from topic to topic   Volume:  loud  Mood: mild mania  Affect:  Congruent  Thought Process:  Linear and Logical  Orientation:  Full (Time, Place, and Person)  Thought Content:  Negative  Suicidal Thoughts:  No  Homicidal Thoughts:  No  Memory:  Immediate;   Good Recent;   Good Remote;   Good  Judgement:  Good  Insight:  Good  Psychomotor Activity:  Negative   Concentration:  Good  Recall:  Good  Fund of Knowledge: Good  Language: Good  Akathisia:  Negative  Handed:  Right unknown   AIMS (if indicated):    Assets:  Communication Skills Desire for Improvement Social Support  ADL's:  Intact  Cognition: WNL  Sleep:  Was disturbed   Is the patient at risk to self?  No. Has the patient been a risk to self in the past 6 months?  No. Has the patient been a risk to self within the distant past?  No. Is the patient a risk to others?  No. Has the patient been a risk to others in the past 6 months?  No. Has the patient been a risk to others within the distant past?  No.  Current Medications: Current Outpatient Prescriptions  Medication Sig Dispense Refill  . aspirin 81 MG tablet Take 81 mg by mouth daily.     . Calcium Citrate-Vitamin D ([employer]S CALCIUM CITRATE +D3 MINI) 200-250 MG-UNIT TABS Take 1 tablet by mouth daily.     . carbamazepine (TEGRETOL) 200 MG tablet Take 2 tablets (400 mg total) by mouth 2 times daily at 12 noon and 4 pm. 120 tablet 2  . divalproex (DEPAKOTE ER) 500 MG 24 hr tablet Take 1 tablet (500 mg total) by mouth 2 (two) times daily. 60 tablet 1  . folic acid (FOLVITE) 1 MG tablet Take 1 mg by mouth daily.     Marland Kitchen gabapentin (NEURONTIN) 300 MG capsule Take 1 capsule (300 mg total) by mouth 2 (two) times daily. 60 capsule 2  . levothyroxine (SYNTHROID, LEVOTHROID) 75 MCG tablet TAKE 1 TABLET BY MOUTH EACH DAY ON EMPTYSTOMACH WITH A GLASS OF WATERAT LEAST 30 TO 60 MINUTES BEFORE BREAKFAST IN THE MORNING    . methotrexate 2.5 MG tablet Take 2.5 mg by mouth daily.     . nitroGLYCERIN (NITROSTAT) 0.4 MG SL tablet Place 0.4 mg under the tongue every 5 (five) minutes as needed.     . ondansetron (ZOFRAN) 8 MG tablet TAKE 1 TABLET BY MOUTH EVERY 12 HOURS ASNEEDED FOR NAUSEA    . pravastatin (PRAVACHOL) 10 MG tablet Take 10 mg by mouth daily.     . ranitidine (ZANTAC) 150 MG tablet Take 150 mg by mouth 2 (two) times daily.     . traZODone  (DESYREL) 50 MG tablet Take 1 tablet (50 mg total) by mouth at bedtime. 30 tablet 1   No current facility-administered medications for this visit.     Medical Decision Making:  Established Problem, Stable/Improving (1) and Review of Medication Regimen & Side Effects (2)  Treatment Plan Summary:Medication management and Plan We'll continue patient on her medications without any changes.  Bipolar disorder-   Continue Neurontin 300 mg  twice daily continue tegretol to 400mg  po bid,  Depakote 500 mg twice daily. We discussed monitoring her for side effects and improvement in manic symptoms.  Return to clinic in 4 weeks. Patient urged to be careful when driving. Patient reminded to obtain labs with carbamazepine level and Depakote level, patient given a lab slip.  She's been encouraged call any questions or concerns prior to her next appointment.  If her manic symptoms continue she is to go to the ER or call 911.   Jaterrius Ricketson 12/16/2016, 10:54 AM

## 2016-12-17 ENCOUNTER — Ambulatory Visit
Admission: RE | Admit: 2016-12-17 | Discharge: 2016-12-17 | Disposition: A | Discharge status: Home / Self Care | Payer: Medicare Other | Source: Ambulatory Visit | Attending: Ophthalmology | Admitting: Ophthalmology

## 2016-12-17 DIAGNOSIS — H532 Diplopia: Secondary | ICD-10-CM

## 2016-12-17 DIAGNOSIS — G319 Degenerative disease of nervous system, unspecified: Secondary | ICD-10-CM | POA: Insufficient documentation

## 2016-12-17 DIAGNOSIS — I6782 Cerebral ischemia: Secondary | ICD-10-CM | POA: Diagnosis not present

## 2016-12-17 MED ORDER — GADOBENATE DIMEGLUMINE 529 MG/ML IV SOLN
10.0000 mL | Freq: Once | INTRAVENOUS | Status: AC | PRN
  Administered 2016-12-17: 9 mL via INTRAVENOUS

## 2017-01-07 ENCOUNTER — Other Ambulatory Visit: Payer: Self-pay | Admitting: Psychiatry

## 2017-01-13 ENCOUNTER — Other Ambulatory Visit
Admission: RE | Admit: 2017-01-13 | Discharge: 2017-01-13 | Disposition: A | Discharge status: Home / Self Care | Payer: Medicare Other | Source: Ambulatory Visit | Attending: Ophthalmology | Admitting: Ophthalmology

## 2017-01-13 DIAGNOSIS — H532 Diplopia: Secondary | ICD-10-CM | POA: Insufficient documentation

## 2017-01-18 ENCOUNTER — Ambulatory Visit: Payer: Medicare Other | Admitting: Psychiatry

## 2017-01-18 ENCOUNTER — Encounter: Payer: Self-pay | Admitting: Psychiatry

## 2017-01-18 VITALS — BP 128/82 | HR 74 | Ht 60.0 in | Wt 104.2 lb

## 2017-01-18 DIAGNOSIS — F3110 Bipolar disorder, current episode manic without psychotic features, unspecified: Secondary | ICD-10-CM

## 2017-01-18 MED ORDER — TRAZODONE HCL 50 MG PO TABS: 50.0000 mg | ORAL_TABLET | Freq: Every day | ORAL | 1 refills | Status: DC

## 2017-01-18 MED ORDER — CARBAMAZEPINE 200 MG PO TABS: 400.0000 mg | ORAL_TABLET | Freq: Two times a day (BID) | ORAL | 2 refills | Status: DC

## 2017-01-18 MED ORDER — DIVALPROEX SODIUM ER 500 MG PO TB24: 500.0000 mg | ORAL_TABLET | Freq: Two times a day (BID) | ORAL | 2 refills | Status: DC

## 2017-01-18 NOTE — Progress Notes (Signed)
Patient ID: Stephanie [name], female   DOB: [birth year], 71 y.o.   MRN: [id number] [facility] MD/PA/NP OP Progress Note  01/18/2017 10:25 AM ADDISYNN VASSELL  MRN:  [id number]  Subjective:  Patient returns for follow-up of her bipolar disorder and generalized anxiety disorder. Reports doing well in terms of her mood. Reports feeling more anxious about her upcoming cataract surgery and also about her dog. States her dog has been anxious and this makers her anxious. She has been recommended to give her dog away. She is  thinking about it. Sleeping well, states she has to eat better. She did get her labs done and her levels were within normal limits.  Chief Complaint: stable  Visit Diagnosis:     ICD-10-CM   1. Bipolar I disorder, most recent episode (or current) manic ([place]) F31.10     Past Medical History:  Past Medical History:  Diagnosis Date  . Anxiety   . Bipolar 1 disorder ([place])   . Bipolar disorder ([place])   . Depression   . Fatigue   . Fibromyalgia   . GERD (gastroesophageal reflux disease)   . Heart disease   . Heart failure ([place])   . HLD (hyperlipidemia)   . Hypothyroidism   . Pelvic pain in female   . Perianal lesion    high grade sil lesion- keratinizing type  . RA (rheumatoid arthritis) ([place])   . Raynaud phenomenon   . Sleep apnea   . Vaginal Pap smear, abnormal     Past Surgical History:  Procedure Laterality Date  . TONSILLECTOMY     Family History:  Family History  Problem Relation Age of Onset  . Heart failure Father   . Parkinson's disease Mother   . Bipolar disorder Mother   . Depression Sister   . Schizophrenia Paternal Grandmother   . Diabetes Maternal Grandmother   . Pancreatic cancer Maternal Grandmother   . Cancer Neg Hx   . Heart disease Neg Hx    Social History:  Social History   Socioeconomic History  . Marital status: Divorced    Spouse name: None  . Number of children: None  . Years of education: None  . Highest education level: None  Social  Needs  . Financial resource strain: None  . Food insecurity - worry: None  . Food insecurity - inability: None  . Transportation needs - medical: None  . Transportation needs - non-medical: None  Occupational History  . None  Tobacco Use  . Smoking status: Never Smoker  . Smokeless tobacco: Former Network engineer and Sexual Activity  . Alcohol use: No    Alcohol/week: 0.0 oz  . Drug use: No  . Sexual activity: No    Birth control/protection: Post-menopausal  Other Topics Concern  . None  Social History Narrative  . None   Additional History:   Assessment:   Musculoskeletal: Strength & Muscle Tone: within normal limits Gait & Station: unsteady, Walking with a cane Patient leans: N/A  Psychiatric Specialty Exam: Medication Refill     Review of Systems  Psychiatric/Behavioral: Negative for depression, hallucinations, memory loss, substance abuse and suicidal ideas. The patient is not nervous/anxious and does not have insomnia.   All other systems reviewed and are negative.   Blood pressure 138/82, pulse 74, height 5' (1.524 m), weight 104 lb 3.2 oz (47.3 kg).Body mass index is 20.35 kg/m.  General Appearance: Neat and Well Groomed  Eye Contact:  Good  Speech:  normal  Volume:  normal  Mood: euthymic  Affect:  Congruent  Thought Process:  Linear and Logical  Orientation:  Full (Time, Place, and Person)  Thought Content:  Negative  Suicidal Thoughts:  No  Homicidal Thoughts:  No  Memory:  Immediate;   Good Recent;   Good Remote;   Good  Judgement:  Good  Insight:  Good  Psychomotor Activity:  Negative  Concentration:  Good  Recall:  Good  Fund of Knowledge: Good  Language: Good  Akathisia:  Negative  Handed:  Right unknown   AIMS (if indicated):    Assets:  Communication Skills Desire for Improvement Social Support  ADL's:  Intact  Cognition: WNL  Sleep:  improved   Is the patient at risk to self?  No. Has the patient been a risk to self in the past  6 months?  No. Has the patient been a risk to self within the distant past?  No. Is the patient a risk to others?  No. Has the patient been a risk to others in the past 6 months?  No. Has the patient been a risk to others within the distant past?  No.  Current Medications: Current Outpatient Medications  Medication Sig Dispense Refill  . aspirin 81 MG tablet Take 81 mg by mouth daily.     . Calcium Citrate-Vitamin D (CVS CALCIUM CITRATE +D3 MINI) 200-250 MG-UNIT TABS Take 1 tablet by mouth daily.     . carbamazepine (TEGRETOL) 200 MG tablet Take 2 tablets (400 mg total) by mouth 2 times daily at 12 noon and 4 pm. 120 tablet 2  . divalproex (DEPAKOTE ER) 500 MG 24 hr tablet Take 1 tablet (500 mg total) by mouth 2 (two) times daily. 60 tablet 1  . folic acid (FOLVITE) 1 MG tablet Take 1 mg by mouth daily.     Marland Kitchen gabapentin (NEURONTIN) 300 MG capsule TAKE 1 CAPSULE BY MOUTH TWICE DAILY 60 capsule 2  . levothyroxine (SYNTHROID, LEVOTHROID) 75 MCG tablet TAKE 1 TABLET BY MOUTH EACH DAY ON EMPTYSTOMACH WITH A GLASS OF WATERAT LEAST 30 TO 60 MINUTES BEFORE BREAKFAST IN THE MORNING    . methotrexate 2.5 MG tablet Take 2.5 mg by mouth daily.     . nitroGLYCERIN (NITROSTAT) 0.4 MG SL tablet Place 0.4 mg under the tongue every 5 (five) minutes as needed.     . ondansetron (ZOFRAN) 8 MG tablet TAKE 1 TABLET BY MOUTH EVERY 12 HOURS ASNEEDED FOR NAUSEA    . pravastatin (PRAVACHOL) 10 MG tablet Take 10 mg by mouth daily.     . ranitidine (ZANTAC) 150 MG tablet Take 150 mg by mouth 2 (two) times daily.     . traZODone (DESYREL) 50 MG tablet Take 1 tablet (50 mg total) by mouth at bedtime. 30 tablet 1   No current facility-administered medications for this visit.     Medical Decision Making:  Established Problem, Stable/Improving (1) and Review of Medication Regimen & Side Effects (2)  Treatment Plan Summary:Medication management and Plan We'll continue patient on her medications without any  changes.  Bipolar disorder-   Continue Neurontin 300 mg twice daily continue tegretol at 400mg  po bid,  Depakote 500 mg twice daily.   Labs- Depakote level at 69, Tegretol level at 7.5 and both levels are therapeutic.  Return to clinic in 3 months. She's been encouraged call any questions or concerns prior to her next appointment.   Brently Voorhis 01/18/2017, 10:25 AM

## 2017-01-20 ENCOUNTER — Encounter: Payer: Self-pay | Admitting: Obstetrics and Gynecology

## 2017-01-20 ENCOUNTER — Ambulatory Visit: Payer: Medicare Other | Admitting: Obstetrics and Gynecology

## 2017-01-20 DIAGNOSIS — Z87412 Personal history of vulvar dysplasia: Secondary | ICD-10-CM

## 2017-01-20 NOTE — Progress Notes (Signed)
Chief complaint: 1.  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 Colposcopy last year was normal Patient remains asymptomatic without vulvar itching burning or pain.  OBJECTIVE: BP (!) 145/73   Pulse 88   Ht 5' (1.524 m)   Wt 103 lb 1.6 oz (46.8 kg)   BMI 20.14 kg/m  Pleasant well-appearing female in no acute distress.  Alert and oriented. Pelvic exam: External genitalia-with atrophic changes; no gross lesions. BUS: Normal. Vagina: Atrophic changes.  PROCEDURE: Vulvar colposcopy. Indications: History of VIN 3; status post wide local excision 2015 Findings: No lesions on gross inspection; normal vulvar colposcopy without evidence of abnormality Biopsies: None Description: Verbal consent is obtained.  Patient is placed in the dorsolithotomy position.  The vulva is soaked with 4 x 4 pads containing acetic acid solution.  After several minutes the 4 x 4's are removed and colposcopy is performed with no abnormal findings being identified.  Procedure was well-tolerated.  Complications are none.  ASSESSMENT: 1.  History of VIN 3, status post wide local excision in 2015 2.  Subsequent colposcopies have been negative for abnormal findings 3.  Patient remains asymptomatic without itching or burning  PLAN: 1.  Return in 1 year for pelvic exam/vulvar inspection without colposcopy (unless symptoms are present)  Brayton Mars, MD  Note: This dictation was prepared with Dragon dictation along with smaller phrase technology. Any transcriptional errors that result from this process are unintentional.

## 2017-01-20 NOTE — Patient Instructions (Signed)
Colposcopy was normal today  1.  Return in 1 year for follow-up (no colposcopy unless itching symptoms are present)

## 2017-01-26 ENCOUNTER — Ambulatory Visit: Payer: Medicare Other | Admitting: Psychiatry

## 2017-01-26 ENCOUNTER — Other Ambulatory Visit: Payer: Self-pay

## 2017-01-26 ENCOUNTER — Encounter: Payer: Self-pay | Admitting: Psychiatry

## 2017-01-26 VITALS — BP 144/92 | HR 81 | Temp 97.9°F

## 2017-01-26 DIAGNOSIS — G4700 Insomnia, unspecified: Secondary | ICD-10-CM | POA: Diagnosis not present

## 2017-01-26 DIAGNOSIS — F3110 Bipolar disorder, current episode manic without psychotic features, unspecified: Secondary | ICD-10-CM | POA: Diagnosis not present

## 2017-01-26 MED ORDER — TRAZODONE HCL 50 MG PO TABS: 50.0000 mg | ORAL_TABLET | Freq: Every day | ORAL | 2 refills | Status: DC

## 2017-01-26 MED ORDER — DIVALPROEX SODIUM ER 500 MG PO TB24: ORAL_TABLET | ORAL | 2 refills | Status: DC

## 2017-01-26 NOTE — Progress Notes (Signed)
Patient ID: RIA REDCAY, female   DOB: 06/23/1945, 71 y.o.   MRN: [id number] [facility] MD/PA/NP OP Progress Note  01/26/2017 5:17 PM Stephanie [name]  MRN:  [id number]  Subjective:  Patient returns for follow-up of her bipolar disorder and generalized anxiety disorder. Patient was just seen last week and had reported anxiety about her upcoming cataract surgery we had discussed that she probably should give away her dog which has been causing a lot of stress. Patient called in for an emergency appointment today and stated that though she gave away her dog she has been feeling depressed. States that she is sleeping okay on the trazodone but she is not doing good. Reports depressed mood. She continues to worry about her surgery. Denies any suicidal thoughts. She denies any manic symptoms. We discussed extensively for the need for her to have someone be with her during her surgery and for 2 days afterwards to help her with her eyedrops. Discussed that she should call her son and have him visit her since he lives in [place]. Patient agreeable to this plan.  Chief Complaint: Feeling anxious and depressed Chief Complaint    Depression; Medication Problem     Visit Diagnosis:     ICD-10-CM   1. Bipolar I disorder, most recent episode (or current) manic ([place]) F31.10   2. 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 Anxiety   . Bipolar 1 disorder ([place])   . Bipolar disorder ([place])   . Depression   . Fatigue   . Fibromyalgia   . GERD (gastroesophageal reflux disease)   . Heart disease   . Heart failure ([place])   . HLD (hyperlipidemia)   . Hypothyroidism   . Pelvic pain in female   . Perianal lesion    high grade sil lesion- keratinizing type  . RA (rheumatoid arthritis) ([place])   . Raynaud phenomenon   . Sleep apnea   . Vaginal Pap smear, abnormal     Past Surgical History:  Procedure Laterality Date  .  TONSILLECTOMY     Family History:  Family History  Problem Relation Age of Onset  . Heart failure Father   . Parkinson's disease Mother   . Bipolar disorder Mother   . Depression Sister   . Schizophrenia Paternal Grandmother   . Diabetes Maternal Grandmother   . Pancreatic cancer Maternal Grandmother   . Cancer Neg Hx   . Heart disease Neg Hx    Social History:  Social History   Socioeconomic History  . Marital status: Divorced    Spouse name: None  . Number of children: 2  . Years of education: None  . Highest education level: Master's degree (e.g., MA, MS, MEng, MEd, MSW, MBA)  Social Needs  . Financial resource strain: Somewhat hard  . Food insecurity - worry: Never true  . Food insecurity - inability: Never true  . Transportation needs - medical: No  . Transportation needs - non-medical: No  Occupational History    Comment: retired2  Tobacco Use  . Smoking status: Never Smoker  . Smokeless tobacco: Former Network engineer and Sexual Activity  . Alcohol use: No    Alcohol/week: 0.0 oz  . Drug use: No  . Sexual activity: No    Birth control/protection: Post-menopausal  Other Topics Concern  . None  Social History Narrative  . None   Additional History:  Assessment:   Musculoskeletal: Strength & Muscle Tone: within normal limits Gait & Station: unsteady, Walking with a cane Patient leans: N/A  Psychiatric Specialty Exam: Medication Refill     Review of Systems  Psychiatric/Behavioral: Negative for depression, hallucinations, memory loss, substance abuse and suicidal ideas. The patient is not nervous/anxious and does not have insomnia.   All other systems reviewed and are negative.   Blood pressure (!) 144/92, pulse 81, temperature 97.9 F (36.6 C), temperature source Oral.There is no height or weight on file to calculate BMI.  General Appearance: Neat and Well Groomed  Eye Contact:  Good  Speech:  normal  Volume:  normal  Mood: Irritable    Affect:  Anxious   Thought Process:  Linear and Logical  Orientation:  Full (Time, Place, and Person)  Thought Content:  Negative  Suicidal Thoughts:  No  Homicidal Thoughts:  No  Memory:  Immediate;   Good Recent;   Good Remote;   Good  Judgement:  Good  Insight:  Good  Psychomotor Activity:  Negative  Concentration:  Good  Recall:  Good  Fund of Knowledge: Good  Language: Good  Akathisia:  Negative  Handed:  Right unknown   AIMS (if indicated):    Assets:  Communication Skills Desire for Improvement Social Support  ADL's:  Intact  Cognition: WNL  Sleep:  Okay    Is the patient at risk to self?  No. Has the patient been a risk to self in the past 6 months?  No. Has the patient been a risk to self within the distant past?  No. Is the patient a risk to others?  No. Has the patient been a risk to others in the past 6 months?  No. Has the patient been a risk to others within the distant past?  No.  Current Medications: Current Outpatient Medications  Medication Sig Dispense Refill  . aspirin 81 MG tablet Take 81 mg by mouth daily.     . Calcium Citrate-Vitamin D ([employer] CALCIUM CITRATE +D3 MINI) 200-250 MG-UNIT TABS Take 1 tablet by mouth daily.     . carbamazepine (TEGRETOL) 200 MG tablet Take 2 tablets (400 mg total) 2 times daily at 12 noon and 4 pm by mouth. 120 tablet 2  . divalproex (DEPAKOTE ER) 500 MG 24 hr tablet Take 1 tablet in the morning and 2 tablets at nighttime 90 tablet 2  . folic acid (FOLVITE) 1 MG tablet Take 1 mg by mouth daily.     Marland Kitchen gabapentin (NEURONTIN) 300 MG capsule TAKE 1 CAPSULE BY MOUTH TWICE DAILY 60 capsule 2  . levothyroxine (SYNTHROID, LEVOTHROID) 75 MCG tablet TAKE 1 TABLET BY MOUTH EACH DAY ON EMPTYSTOMACH WITH A GLASS OF WATERAT LEAST 30 TO 60 MINUTES BEFORE BREAKFAST IN THE MORNING    . methotrexate 2.5 MG tablet Take 2.5 mg by mouth daily.     . nitroGLYCERIN (NITROSTAT) 0.4 MG SL tablet Place 0.4 mg under the tongue every 5 (five)  minutes as needed.     . ondansetron (ZOFRAN) 8 MG tablet TAKE 1 TABLET BY MOUTH EVERY 12 HOURS ASNEEDED FOR NAUSEA    . pravastatin (PRAVACHOL) 10 MG tablet Take 10 mg by mouth daily.     . ranitidine (ZANTAC) 150 MG tablet Take 150 mg by mouth 2 (two) times daily.     . traZODone (DESYREL) 50 MG tablet Take 1 tablet (50 mg total) by mouth at bedtime. 30 tablet 2   No current facility-administered medications  for this visit.     Medical Decision Making:  Established Problem, Stable/Improving (1) and Review of Medication Regimen & Side Effects (2)  Treatment Plan Summary:Medication management and Plan We'll continue patient on her medications without any changes.  Bipolar disorder-   Continue Neurontin 300 mg twice daily continue tegretol at 400mg  po bid,  Increase Depakote to 1500 mg daily , patient will take 500 mg in the morning and 1000mg  at bedtime. The also discussed that patient should call her son and have him be with her during her cataract surgery since patient is very anxious about the surgery. The plan to call her son during her next visit in 2 weeks time.   Labs- Depakote level at 69, Tegretol level at 7.5 and both levels are therapeutic.  Return to clinic in 2 weeks. She's been encouraged call any questions or concerns prior to her next appointment.   Tavaris Eudy 01/26/2017, 5:17 PM

## 2017-02-05 LAB — MISC LABCORP TEST (SEND OUT): Labcorp test code: 85933

## 2017-02-08 ENCOUNTER — Other Ambulatory Visit: Payer: Self-pay | Admitting: Family Medicine

## 2017-02-08 DIAGNOSIS — Z1231 Encounter for screening mammogram for malignant neoplasm of breast: Secondary | ICD-10-CM

## 2017-02-09 ENCOUNTER — Encounter: Payer: Self-pay | Admitting: Psychiatry

## 2017-02-09 ENCOUNTER — Ambulatory Visit: Payer: Medicare Other | Admitting: Psychiatry

## 2017-02-09 ENCOUNTER — Other Ambulatory Visit: Payer: Self-pay

## 2017-02-09 VITALS — BP 133/86 | HR 67 | Temp 97.3°F | Wt 103.4 lb

## 2017-02-09 DIAGNOSIS — F3110 Bipolar disorder, current episode manic without psychotic features, unspecified: Secondary | ICD-10-CM | POA: Diagnosis not present

## 2017-02-09 NOTE — Progress Notes (Signed)
Patient ID: Stephanie [name], female   DOB: 06/22/1945, 71 y.o.   MRN: [id number] [facility] MD/PA/NP OP Progress Note  02/09/2017 10:08 AM Stephanie [name]  MRN:  [id number]  Subjective:  Patient returns for follow-up of her bipolar disorder and generalized anxiety disorder. Patient was seen for follow-up today for her anxiety. She reports that she was able to tolerate the increase in the Depakote well. States that initially she had a few falls in the house but then changed the Depakote to the evening. States that this helped with the falls. States that her anxiety has improved significantly and she is making plans to take care of things when she has the surgery. She is scheduled to have cataract surgery in December 18. Her children live in [place] and [place] and are busy with their young children. She states that a neighbor will be coming in and helping her put the eyedrops for the first 24 hours. States that she feels a bit guilty about giving up having her dog but also is relieved. We discussed that she will see Dr. Shea Evans next time right before her surgery and patient is agreeable to this and happy that she will be seeing a physician prior to her surgery. Patient aware that this clinician will be out for the next few weeks and her care will be continued by Dr. Shea Evans and at this time we are transitioning her care completely.  Currently she denies any manic symptoms. She denies any suicidal thoughts.  Chief Complaint: Improved anxiety Chief Complaint    Follow-up; Medication Refill     Visit Diagnosis:     ICD-10-CM   1. Bipolar I disorder, most recent episode (or current) manic ([place]) F31.10     Past Medical History:  Past Medical History:  Diagnosis Date  . Anxiety   . Bipolar 1 disorder ([place])   . Bipolar disorder ([place])   . Depression   . Fatigue   . Fibromyalgia   . GERD (gastroesophageal reflux disease)   . Heart disease   . Heart failure ([place])   . HLD (hyperlipidemia)   .  Hypothyroidism   . Pelvic pain in female   . Perianal lesion    high grade sil lesion- keratinizing type  . RA (rheumatoid arthritis) ([place])   . Raynaud phenomenon   . Sleep apnea   . Vaginal Pap smear, abnormal     Past Surgical History:  Procedure Laterality Date  . TONSILLECTOMY     Family History:  Family History  Problem Relation Age of Onset  . Heart failure Father   . Parkinson's disease Mother   . Bipolar disorder Mother   . Depression Sister   . Schizophrenia Paternal Grandmother   . Diabetes Maternal Grandmother   . Pancreatic cancer Maternal Grandmother   . Cancer Neg Hx   . Heart disease Neg Hx    Social History:  Social History   Socioeconomic History  . Marital status: Divorced    Spouse name: None  . Number of children: 2  . Years of education: None  . Highest education level: Master's degree (e.g., MA, MS, MEng, MEd, MSW, MBA)  Social Needs  . Financial resource strain: Somewhat hard  . Food insecurity - worry: Never true  . Food insecurity - inability: Never true  . Transportation needs - medical: No  . Transportation needs - non-medical: No  Occupational History    Comment: retired2  Tobacco Use  . Smoking status: Never Smoker  .  Smokeless tobacco: Former Network engineer and Sexual Activity  . Alcohol use: No    Alcohol/week: 0.0 oz  . Drug use: No  . Sexual activity: No    Birth control/protection: Post-menopausal  Other Topics Concern  . None  Social History Narrative  . None   Additional History:   Assessment:   Musculoskeletal: Strength & Muscle Tone: within normal limits Gait & Station: unsteady, Walking with a cane Patient leans: N/A  Psychiatric Specialty Exam: Medication Refill     Review of Systems  Psychiatric/Behavioral: Negative for depression, hallucinations, memory loss, substance abuse and suicidal ideas. The patient is not nervous/anxious and does not have insomnia.   All other systems reviewed and are  negative.   Blood pressure 133/86, pulse 67, temperature (!) 97.3 F (36.3 C), temperature source Oral, weight 46.9 kg (103 lb 6.4 oz).Body mass index is 20.19 kg/m.  General Appearance: Neat and Well Groomed  Eye Contact:  Good  Speech:  normal  Volume:  normal  Mood: Better   Affect:  Ppleasant   Thought Process:  Linear and Logical  Orientation:  Full (Time, Place, and Person)  Thought Content:  Negative  Suicidal Thoughts:  No  Homicidal Thoughts:  No  Memory:  Immediate;   Good Recent;   Good Remote;   Good  Judgement:  Good  Insight:  Good  Psychomotor Activity:  Negative  Concentration:  Good  Recall:  Good  Fund of Knowledge: Good  Language: Good  Akathisia:  Negative  Handed:  Right unknown   AIMS (if indicated):    Assets:  Communication Skills Desire for Improvement Social Support  ADL's:  Intact  Cognition: WNL  Sleep:  better   Is the patient at risk to self?  No. Has the patient been a risk to self in the past 6 months?  No. Has the patient been a risk to self within the distant past?  No. Is the patient a risk to others?  No. Has the patient been a risk to others in the past 6 months?  No. Has the patient been a risk to others within the distant past?  No.  Current Medications: Current Outpatient Medications  Medication Sig Dispense Refill  . aspirin 81 MG tablet Take 81 mg by mouth daily.     . Calcium Citrate-Vitamin D ([employer]S CALCIUM CITRATE +D3 MINI) 200-250 MG-UNIT TABS Take 1 tablet by mouth daily.     . carbamazepine (TEGRETOL) 200 MG tablet Take 2 tablets (400 mg total) 2 times daily at 12 noon and 4 pm by mouth. 120 tablet 2  . divalproex (DEPAKOTE ER) 500 MG 24 hr tablet Take 1 tablet in the morning and 2 tablets at nighttime 90 tablet 2  . folic acid (FOLVITE) 1 MG tablet Take 1 mg by mouth daily.     Marland Kitchen gabapentin (NEURONTIN) 300 MG capsule TAKE 1 CAPSULE BY MOUTH TWICE DAILY 60 capsule 2  . levothyroxine (SYNTHROID, LEVOTHROID) 75 MCG tablet  TAKE 1 TABLET BY MOUTH EACH DAY ON EMPTYSTOMACH WITH A GLASS OF WATERAT LEAST 30 TO 60 MINUTES BEFORE BREAKFAST IN THE MORNING    . methotrexate 2.5 MG tablet Take 2.5 mg by mouth daily.     . nitroGLYCERIN (NITROSTAT) 0.4 MG SL tablet Place 0.4 mg under the tongue every 5 (five) minutes as needed.     . ondansetron (ZOFRAN) 8 MG tablet TAKE 1 TABLET BY MOUTH EVERY 12 HOURS ASNEEDED FOR NAUSEA    . pravastatin (PRAVACHOL)  10 MG tablet Take 10 mg by mouth daily.     . ranitidine (ZANTAC) 150 MG tablet Take 150 mg by mouth 2 (two) times daily.     . traZODone (DESYREL) 50 MG tablet Take 1 tablet (50 mg total) by mouth at bedtime. 30 tablet 2   No current facility-administered medications for this visit.     Medical Decision Making:  Established Problem, Stable/Improving (1) and Review of Medication Regimen & Side Effects (2)  Treatment Plan Summary:Medication management and Plan We'll continue patient on her medications without any changes.  Bipolar disorder-   Continue Neurontin 300 mg twice daily continue tegretol at 400mg  po bid. Continue Depakote at 1500 mg daily , patient will take 500 mg in the morning and 1000mg  at bedtime. Discussed with patient about deep breathing exercises to help with the anxiety. Also discussed acceptance based therapy to help with her anxiety. Patient recommended to obtain a Depakote level and was given a lab slip.  Labs- Depakote level at 69, Tegretol level at 7.5 and both levels are therapeutic (most recent )  Return to clinic in 1- 2 weeks and follow-up with Dr. Shea Evans . Patient was introduced to Dr. Shea Evans and is comfortable with transitioning her care. She's been encouraged call any questions or concerns prior to her next appointment.   Sirus Labrie 02/09/2017, 10:08 AM

## 2017-02-10 ENCOUNTER — Telehealth: Payer: Self-pay

## 2017-02-10 NOTE — Telephone Encounter (Signed)
pt states that she thinks that the medication is making her fall.  thinks the depakote is too much states that she fell and hit her head.

## 2017-02-10 NOTE — Telephone Encounter (Signed)
Pt was called and given the instruction per dr. Einar Grad advise to just do 1000mg  daily. Pt repeated instructions to me and understood directions.

## 2017-02-10 NOTE — Telephone Encounter (Signed)
Please advise patient to take the depakote at 1000mg  daily and stop the 500mg  that we added recently. She will be following up with Dr.Eappen soon and can address residual mood issues then. She can go back to taking the depakote at 500mg  twice daily.

## 2017-02-18 ENCOUNTER — Telehealth: Payer: Self-pay

## 2017-02-18 ENCOUNTER — Encounter: Payer: Self-pay | Admitting: *Deleted

## 2017-02-18 ENCOUNTER — Other Ambulatory Visit: Payer: Self-pay

## 2017-02-18 ENCOUNTER — Encounter: Payer: Self-pay | Admitting: Psychiatry

## 2017-02-18 ENCOUNTER — Ambulatory Visit: Payer: Medicare Other | Admitting: Psychiatry

## 2017-02-18 VITALS — BP 138/82 | HR 75 | Temp 97.9°F | Wt 104.6 lb

## 2017-02-18 DIAGNOSIS — F3110 Bipolar disorder, current episode manic without psychotic features, unspecified: Secondary | ICD-10-CM | POA: Diagnosis not present

## 2017-02-18 MED ORDER — CARBAMAZEPINE ER 300 MG PO CP12: 300.0000 mg | ORAL_CAPSULE | Freq: Two times a day (BID) | ORAL | 1 refills | Status: DC

## 2017-02-18 NOTE — Telephone Encounter (Signed)
Stephanie [name] , verified .

## 2017-02-18 NOTE — Telephone Encounter (Signed)
the pharmsist would like to speak to the doctor.  the medication you order Stephanie [name] is expensive and he has a copy options to see if you would agree to do so it will be alot cheaper for the patient. [phone]. Speak with chris.

## 2017-02-18 NOTE — Progress Notes (Signed)
[place] MD  OP Progress Note  02/18/2017 6:48 PM AHNIYAH GIANCOLA  MRN:  [id number]  Chief Complaint: ' I had a fall.'  Chief Complaint    Follow-up; Medication Problem; pt fell and brusied up face and cut up hands     HPI: Henli is a 71 year old Caucasian female who is divorced who lives in [place] who presented to the clinic today along with her friend Lelon Frohlich, for a follow-up visit.  Jiselle reports that she recently had a fall and bruised her jaw.  Patient does have a history of falls in the past.  Pt used to follow up with Dr. Einar Grad in the past.  Dr. Einar Grad had made medication readjustments with her Depakote which was increased recently.  Her Depakote later on had to be reduced because she had called the clinic with concerns of falls.  She denies any loss of consciousness or confusion or other cognitive problems after the fall.  Per patient she is anxious due to several reasons.  She has an upcoming cataract surgery on 02/23/2017.  She  is worried about the surgery, regarding support available at home after surgery and so on.  Her friend Lelon Frohlich who is also a retired Therapist, sports provided support during the evaluation by letting her know that she can help her out and take it one day at a time.  Per Lelon Frohlich who provided collateral information, patient also distressed due to recent life events like her dog who had to be given away for adoption to another home.   Patient reports sleep is okay.  Patient denies any suicidal thoughts.  Patient reports appetite is fair, however per her friend, she may not be eating right , even though she claims to.  Discussed with patient as well as her friend that there is an interaction between her carbamazepine and Depakote.  Carbamazepine can reduce the level of Depakote and the Depakote can increase the level of carbamazepine in her system.  She she also could likely be on polypharmacy and her medication combination could be causing dizziness as well as falls.  Discussed changing her  carbamazepine to an extended release form as well as reducing the dose.  Patient agrees with plan.  Also discussed to get carbamazepine and Depakote level in few days after the dose change.  Discussed with patient that CSW here in clinic will be able to help her connect with the resources available in the community, support system after surgery.  Patient felt reassured and agreed with plan.  Patient denied any suicidality or perceptual disturbances.   Visit Diagnosis:    ICD-10-CM   1. Bipolar I disorder, most recent episode (or current) manic (HCC) F31.10 Carbamazepine (EQUETRO) 300 MG CP12    Past Psychiatric History: Hx of Bipolar disorder since the past several years. Follows up with Dr.Ravi at [facility] , transitioned to Probation officer .  Past Medical History:  Past Medical History:  Diagnosis Date  . Anginal pain ([place])   . Anxiety   . Bipolar 1 disorder ([place])   . Bipolar disorder ([place])   . Coronary artery disease   . Depression   . Fatigue   . Fibromyalgia   . GERD (gastroesophageal reflux disease)   . Heart disease   . Heart failure ([place])   . HLD (hyperlipidemia)   . Hypothyroidism   . IBS (irritable bowel syndrome)   . Pelvic pain in female   . Perianal lesion    high grade sil lesion- keratinizing type  . RA (rheumatoid  arthritis) ([place])   . Raynaud phenomenon   . Sleep apnea   . Vaginal Pap smear, abnormal     Past Surgical History:  Procedure Laterality Date  . TONSILLECTOMY      Family Psychiatric History: Mother - bipolar do, sister - depression.  Family History:  Family History  Problem Relation Age of Onset  . Heart failure Father   . Parkinson's disease Mother   . Bipolar disorder Mother   . Depression Sister   . Schizophrenia Paternal Grandmother   . Diabetes Maternal Grandmother   . Pancreatic cancer Maternal Grandmother   . Cancer Neg Hx   . Heart disease Neg Hx     Social History: Divorced , has two adult children , a son who lives in [place] and  a daughter who lives in [place] . She lives alone in [place]. Has supportive friends . Social History   Socioeconomic History  . Marital status: Divorced    Spouse name: None  . Number of children: 2  . Years of education: None  . Highest education level: Master's degree (e.g., MA, MS, MEng, MEd, MSW, MBA)  Social Needs  . Financial resource strain: Somewhat hard  . Food insecurity - worry: Never true  . Food insecurity - inability: Never true  . Transportation needs - medical: No  . Transportation needs - non-medical: No  Occupational History    Comment: retired2  Tobacco Use  . Smoking status: Never Smoker  . Smokeless tobacco: Former Network engineer and Sexual Activity  . Alcohol use: No    Alcohol/week: 0.0 oz  . Drug use: No  . Sexual activity: No    Birth control/protection: Post-menopausal  Other Topics Concern  . None  Social History Narrative  . None    Allergies:  Allergies  Allergen Reactions  . Amoxicillin-Pot Clavulanate Nausea Only and Other (See Comments)    Diarrhea Has patient had a PCN reaction causing immediate rash, facial/tongue/throat swelling, SOB or lightheadedness with hypotension:  Has patient had a PCN reaction causing severe rash involving mucus membranes or skin necrosis: Has patient had a PCN reaction that required hospitalization:  Has patient had a PCN reaction occurring within the last 10 years: If all of the above answers are "NO", then may proceed with Cephalosporin use.   Marland Kitchen Oxycodone-Acetaminophen Nausea Only  . Erythromycin Nausea Only  . Azithromycin Nausea And Vomiting  . Codeine   . Macrolides And Ketolides   . Oxycodone-Acetaminophen   . Penicillins Other (See Comments)    Has patient had a PCN reaction causing immediate rash, facial/tongue/throat swelling, SOB or lightheadedness with hypotension: NO Has patient had a PCN reaction causing severe rash involving mucus membranes or skin necrosis: NO Has patient had a PCN reaction  that required hospitalization: NO Has patient had a PCN reaction occurring within the last 10 years: NO If all of the above answers are "NO", then may proceed with Cephalosporin use.   . Tramadol Nausea And Vomiting  . Lamotrigine Rash  . Sulfa Antibiotics Rash    Metabolic Disorder Labs: No results found for: HGBA1C, MPG No results found for: PROLACTIN No results found for: CHOL, TRIG, HDL, CHOLHDL, VLDL, LDLCALC No results found for: TSH  Therapeutic Level Labs: No results found for: LITHIUM No results found for: VALPROATE No components found for:  CBMZ  Current Medications: Current Outpatient Medications  Medication Sig Dispense Refill  . aspirin 81 MG tablet Take 81 mg by mouth daily.     Marland Kitchen  Calcium Citrate-Vitamin D ([employer] CALCIUM CITRATE +D3 MINI) 200-250 MG-UNIT TABS Take 1 tablet by mouth daily. AFTER BREAKFAST    . divalproex (DEPAKOTE ER) 500 MG 24 hr tablet Take 1 tablet in the morning and 2 tablets at nighttime (Patient taking differently: Take 500 mg by mouth 2 (two) times daily. Take 1 tablet in the morning and 1 tablet at nighttime) 90 tablet 2  . folic acid (FOLVITE) 1 MG tablet Take 2 mg by mouth daily.     Marland Kitchen gabapentin (NEURONTIN) 300 MG capsule TAKE 1 CAPSULE BY MOUTH TWICE DAILY 60 capsule 2  . levothyroxine (SYNTHROID, LEVOTHROID) 75 MCG tablet TAKE 1 TABLET BY MOUTH EACH DAY ON EMPTYSTOMACH WITH A GLASS OF WATERAT LEAST 30 TO 60 MINUTES BEFORE BREAKFAST IN THE MORNING    . methotrexate 2.5 MG tablet Take 15 mg by mouth once a week. WEDNESDAYS    . Multiple Vitamins-Minerals (MULTIVITAMIN WITH MINERALS) tablet Take 1 tablet by mouth daily.    . nitroGLYCERIN (NITROSTAT) 0.4 MG SL tablet Place 0.4 mg under the tongue every 5 (five) minutes as needed.     . ondansetron (ZOFRAN) 8 MG tablet TAKE 1 TABLET BY MOUTH EVERY 12 HOURS ASNEEDED FOR NAUSEA    . Polyethyl Glycol-Propyl Glycol (SYSTANE) 0.4-0.3 % GEL ophthalmic gel Place 1 application into both eyes 2 (two) times  daily. Equate Brand    . pravastatin (PRAVACHOL) 10 MG tablet Take 10 mg by mouth every evening.     . ranitidine (ZANTAC) 150 MG tablet Take 150 mg by mouth 2 (two) times daily.     . traZODone (DESYREL) 50 MG tablet Take 1 tablet (50 mg total) by mouth at bedtime. 30 tablet 2  . Carbamazepine (EQUETRO) 300 MG CP12 Take 1 capsule (300 mg total) by mouth 2 (two) times daily. 60 each 1   No current facility-administered medications for this visit.      Musculoskeletal: Strength & Muscle Tone: within normal limits Gait & Station: normal Patient leans: N/A  Psychiatric Specialty Exam: Review of Systems  Psychiatric/Behavioral: The patient is nervous/anxious.   All other systems reviewed and are negative.   Blood pressure 138/82, pulse 75, temperature 97.9 F (36.6 C), temperature source Oral, weight 104 lb 9.6 oz (47.4 kg).Body mass index is 20.43 kg/m.  General Appearance: Casual  Eye Contact:  Fair  Speech:  Clear and Coherent  Volume:  Normal  Mood:  Anxious  Affect:  Appropriate  Thought Process:  Goal Directed and Descriptions of Associations: Intact  Orientation:  Full (Time, Place, and Person)  Thought Content: Logical   Suicidal Thoughts:  No  Homicidal Thoughts:  No  Memory:  Immediate;   Fair Recent;   Fair Remote;   Fair  Judgement:  Fair  Insight:  Fair  Psychomotor Activity:  Normal  Concentration:  Concentration: Fair and Attention Span: Fair  Recall:  [employer] of Knowledge: Fair  Language: Fair  Akathisia:  No  Handed:  Right  AIMS (if indicated): NA  Assets:  Communication Skills Desire for Improvement Housing Social Support  ADL's:  Intact  Cognition: WNL  Sleep:  Fair   Screenings:   Assessment and Plan: Kaitlyne is a 71 year old Caucasian female who is divorced, lives in [place], who has a history of bipolar disorder, multiple medical problems, upcoming surgery of her eyes who presented to the clinic today with her friend.  Patient had recent  medication changes done by her provider Dr. Einar Grad.  Patient presented today  after falls, she does have a history of falls in the past too.  Patient at this time is alert oriented does not appear to be cognitively impaired or confused, she and her friend denied any confusion or cognitive problems after the fall.  Discussed medication changes with patient.  Plan as noted below. Plan  For bipolar disorder Change Tegretol to carbamazepine ER 300 mg p.o. twice daily. Continue Depakote 500 mg p.o. twice daily Continue Neurontin 300 mg p.o. twice daily  For insomnia Trazodone 50 mg p.o. nightly as needed  Most recent lab- by Dr. Arthor Captain level-69 therapeutic, Tegretol level-7.5-therapeutic.  Provided lab slips to get Tegretol level and Depakote level 5 days after starting  the new change in dose.   Vital signs reviewed-within normal limits.  Patient referred to Ms. Royal Piedra to refer her to community services available, support at home after surgery and so on.   Provided medication education, discussed with patient the effect of medications, the effect of polypharmacy, side effect profile.  Discussed with her to use a walker at home and to call for help if she needs assistance.  Her friend will also provide assistance and support.    Follow-up in clinic in 3-4 weeks or sooner if needed    Ursula Alert, MD 02/18/2017, 6:48 PM

## 2017-02-22 ENCOUNTER — Other Ambulatory Visit: Payer: Self-pay | Admitting: Psychiatry

## 2017-02-23 ENCOUNTER — Ambulatory Visit: Payer: Medicare Other | Admitting: Certified Registered Nurse Anesthetist

## 2017-02-23 ENCOUNTER — Encounter
Admission: RE | Disposition: A | Discharge status: Home / Self Care | Payer: Self-pay | Source: Ambulatory Visit | Attending: Ophthalmology

## 2017-02-23 ENCOUNTER — Ambulatory Visit
Admission: RE | Admit: 2017-02-23 | Discharge: 2017-02-23 | Disposition: A | Discharge status: Home / Self Care | Payer: Medicare Other | Source: Ambulatory Visit | Attending: Ophthalmology | Admitting: Ophthalmology

## 2017-02-23 DIAGNOSIS — Z7982 Long term (current) use of aspirin: Secondary | ICD-10-CM | POA: Insufficient documentation

## 2017-02-23 DIAGNOSIS — I10 Essential (primary) hypertension: Secondary | ICD-10-CM | POA: Insufficient documentation

## 2017-02-23 DIAGNOSIS — E7800 Pure hypercholesterolemia, unspecified: Secondary | ICD-10-CM | POA: Insufficient documentation

## 2017-02-23 DIAGNOSIS — K219 Gastro-esophageal reflux disease without esophagitis: Secondary | ICD-10-CM | POA: Diagnosis not present

## 2017-02-23 DIAGNOSIS — I2510 Atherosclerotic heart disease of native coronary artery without angina pectoris: Secondary | ICD-10-CM | POA: Diagnosis not present

## 2017-02-23 DIAGNOSIS — M069 Rheumatoid arthritis, unspecified: Secondary | ICD-10-CM | POA: Insufficient documentation

## 2017-02-23 DIAGNOSIS — E785 Hyperlipidemia, unspecified: Secondary | ICD-10-CM | POA: Insufficient documentation

## 2017-02-23 DIAGNOSIS — E039 Hypothyroidism, unspecified: Secondary | ICD-10-CM | POA: Diagnosis not present

## 2017-02-23 DIAGNOSIS — G4730 Sleep apnea, unspecified: Secondary | ICD-10-CM | POA: Diagnosis not present

## 2017-02-23 DIAGNOSIS — Z79899 Other long term (current) drug therapy: Secondary | ICD-10-CM | POA: Insufficient documentation

## 2017-02-23 DIAGNOSIS — H2512 Age-related nuclear cataract, left eye: Secondary | ICD-10-CM | POA: Diagnosis not present

## 2017-02-23 DIAGNOSIS — F319 Bipolar disorder, unspecified: Secondary | ICD-10-CM | POA: Diagnosis not present

## 2017-02-23 HISTORY — DX: Sjogren syndrome, unspecified: M35.00

## 2017-02-23 HISTORY — DX: Irritable bowel syndrome, unspecified: K58.9

## 2017-02-23 HISTORY — PX: CATARACT EXTRACTION W/PHACO: SHX586

## 2017-02-23 HISTORY — DX: Atherosclerotic heart disease of native coronary artery without angina pectoris: I25.10

## 2017-02-23 HISTORY — DX: Angina pectoris, unspecified: I20.9

## 2017-02-23 LAB — CARBAMAZEPINE LEVEL, TOTAL: Carbamazepine (Tegretol), S: 5.6 ug/mL (ref 4.0–12.0)

## 2017-02-23 LAB — VALPROIC ACID LEVEL: Valproic Acid Lvl: 58 ug/mL (ref 50–100)

## 2017-02-23 SURGERY — PHACOEMULSIFICATION, CATARACT, WITH IOL INSERTION
Anesthesia: Monitor Anesthesia Care | Site: Eye | Laterality: Left | Wound class: Clean

## 2017-02-23 MED ORDER — FENTANYL CITRATE (PF) 100 MCG/2ML IJ SOLN
INTRAMUSCULAR | Status: DC | PRN
  Administered 2017-02-23: 50 ug via INTRAVENOUS

## 2017-02-23 MED ORDER — SODIUM CHLORIDE 0.9 % IV SOLN
INTRAVENOUS | Status: DC
  Administered 2017-02-23: 08:00:00 via INTRAVENOUS

## 2017-02-23 MED ORDER — ARMC OPHTHALMIC DILATING DROPS
1.0000 "application " | OPHTHALMIC | Status: AC
  Administered 2017-02-23 (×3): 1 via OPHTHALMIC

## 2017-02-23 MED ORDER — CARBACHOL 0.01 % IO SOLN
INTRAOCULAR | Status: DC | PRN
  Administered 2017-02-23: .5 mL via INTRAOCULAR

## 2017-02-23 MED ORDER — POVIDONE-IODINE 5 % OP SOLN
OPHTHALMIC | Status: DC | PRN
  Administered 2017-02-23: 1 via OPHTHALMIC

## 2017-02-23 MED ORDER — EPINEPHRINE PF 1 MG/ML IJ SOLN
INTRAOCULAR | Status: DC | PRN
  Administered 2017-02-23: 1 mL via OPHTHALMIC

## 2017-02-23 MED ORDER — NA CHONDROIT SULF-NA HYALURON 40-17 MG/ML IO SOLN
INTRAOCULAR | Status: DC | PRN
Start: 2017-02-23 — End: 2017-02-23
  Administered 2017-02-23: 1 mL via INTRAOCULAR

## 2017-02-23 MED ORDER — LIDOCAINE HCL (PF) 4 % IJ SOLN
INTRAOCULAR | Status: DC | PRN
  Administered 2017-02-23: 2 mL via OPHTHALMIC

## 2017-02-23 MED ORDER — EPINEPHRINE PF 1 MG/ML IJ SOLN
INTRAMUSCULAR | Status: AC
  Filled 2017-02-23: qty 1

## 2017-02-23 MED ORDER — POVIDONE-IODINE 5 % OP SOLN
OPHTHALMIC | Status: AC
  Filled 2017-02-23: qty 30

## 2017-02-23 MED ORDER — FENTANYL CITRATE (PF) 100 MCG/2ML IJ SOLN
INTRAMUSCULAR | Status: AC
  Filled 2017-02-23: qty 2

## 2017-02-23 MED ORDER — NA CHONDROIT SULF-NA HYALURON 40-17 MG/ML IO SOLN
INTRAOCULAR | Status: AC
  Filled 2017-02-23: qty 1

## 2017-02-23 MED ORDER — LIDOCAINE HCL (PF) 4 % IJ SOLN
INTRAMUSCULAR | Status: AC
  Filled 2017-02-23: qty 5

## 2017-02-23 MED ORDER — MOXIFLOXACIN HCL 0.5 % OP SOLN: 1.0000 [drp] | OPHTHALMIC | Status: DC | PRN

## 2017-02-23 MED ORDER — MOXIFLOXACIN HCL 0.5 % OP SOLN
OPHTHALMIC | Status: DC | PRN
  Administered 2017-02-23: .2 mL via OPHTHALMIC

## 2017-02-23 SURGICAL SUPPLY — 16 items
GLOVE BIO SURGEON STRL SZ8 (GLOVE) ×3 IMPLANT
GLOVE BIOGEL M 6.5 STRL (GLOVE) ×3 IMPLANT
GLOVE SURG LX 8.0 MICRO (GLOVE) ×2
GLOVE SURG LX STRL 8.0 MICRO (GLOVE) ×1 IMPLANT
GOWN STRL REUS W/ TWL LRG LVL3 (GOWN DISPOSABLE) ×2 IMPLANT
GOWN STRL REUS W/TWL LRG LVL3 (GOWN DISPOSABLE) ×4
LABEL CATARACT MEDS ST (LABEL) ×3 IMPLANT
LENS IOL TECNIS ITEC 24.0 (Intraocular Lens) ×3 IMPLANT
PACK CATARACT (MISCELLANEOUS) ×3 IMPLANT
PACK CATARACT BRASINGTON LX (MISCELLANEOUS) ×3 IMPLANT
PACK EYE AFTER SURG (MISCELLANEOUS) ×3 IMPLANT
SOL BSS BAG (MISCELLANEOUS) ×3
SOLUTION BSS BAG (MISCELLANEOUS) ×1 IMPLANT
SYR 5ML LL (SYRINGE) ×3 IMPLANT
WATER STERILE IRR 250ML POUR (IV SOLUTION) ×3 IMPLANT
WIPE NON LINTING 3.25X3.25 (MISCELLANEOUS) ×3 IMPLANT

## 2017-02-23 NOTE — Anesthesia Preprocedure Evaluation (Signed)
Anesthesia Evaluation  Patient identified by MRN, date of birth, ID band Patient awake    Reviewed: Allergy & Precautions, NPO status , Patient's Chart, lab work & pertinent test results  History of Anesthesia Complications Negative for: history of anesthetic complications  Airway Mallampati: III  TM Distance: >3 FB Neck ROM: Full    Dental no notable dental hx.    Pulmonary sleep apnea (mild, does not wear CPAP) , neg COPD,    breath sounds clear to auscultation- rhonchi (-) wheezing      Cardiovascular hypertension, + angina (-) Past MI, (-) Cardiac Stents and (-) CABG  Rhythm:Regular Rate:Normal - Systolic murmurs and - Diastolic murmurs    Neuro/Psych PSYCHIATRIC DISORDERS Anxiety Depression Bipolar Disorder negative neurological ROS     GI/Hepatic Neg liver ROS, GERD  ,  Endo/Other  neg diabetesHypothyroidism   Renal/GU negative Renal ROS     Musculoskeletal  (+) Arthritis , Fibromyalgia -  Abdominal (+) - obese,   Peds  Hematology negative hematology ROS (+)   Anesthesia Other Findings Past Medical History: No date: Anginal pain ([place]) No date: Anxiety No date: Bipolar 1 disorder (HCC) No date: Bipolar disorder ([place]) No date: Coronary artery disease No date: Depression No date: Fatigue No date: Fibromyalgia No date: GERD (gastroesophageal reflux disease) No date: Heart disease No date: Heart failure (HCC) No date: HLD (hyperlipidemia) No date: Hypothyroidism No date: IBS (irritable bowel syndrome) No date: Pelvic pain in female No date: Perianal lesion     Comment:  high grade sil lesion- keratinizing type No date: RA (rheumatoid arthritis) (HCC) No date: Raynaud phenomenon No date: Sjogren's disease (HCC) No date: Sleep apnea No date: Vaginal Pap smear, abnormal   Reproductive/Obstetrics                             Anesthesia Physical Anesthesia Plan  ASA:  III  Anesthesia Plan: MAC   Post-op Pain Management:    Induction: Intravenous  PONV Risk Score and Plan: 2 and Midazolam  Airway Management Planned: Natural Airway  Additional Equipment:   Intra-op Plan:   Post-operative Plan:   Informed Consent: I have reviewed the patients History and Physical, chart, labs and discussed the procedure including the risks, benefits and alternatives for the proposed anesthesia with the patient or authorized representative who has indicated his/her understanding and acceptance.     Plan Discussed with: CRNA and Anesthesiologist  Anesthesia Plan Comments:         Anesthesia Quick Evaluation

## 2017-02-23 NOTE — Op Note (Signed)
PREOPERATIVE DIAGNOSIS:  Nuclear sclerotic cataract of the left eye.   POSTOPERATIVE DIAGNOSIS:  Nuclear sclerotic cataract of the left eye.   OPERATIVE PROCEDURE: Procedure(s): CATARACT EXTRACTION PHACO AND INTRAOCULAR LENS PLACEMENT (IOC)   SURGEON:  Birder Robson, MD.   ANESTHESIA:  Anesthesiologist: Emmie Niemann, MD CRNA: Demetrius Charity, CRNA; Nelda Marseille, CRNA  1.      Managed anesthesia care. 2.     0.16ml of Shugarcaine was instilled following the paracentesis   COMPLICATIONS:  None.   TECHNIQUE:   Stop and chop   DESCRIPTION OF PROCEDURE:  The patient was examined and consented in the preoperative holding area where the aforementioned topical anesthesia was applied to the left eye and then brought back to the Operating Room where the left eye was prepped and draped in the usual sterile ophthalmic fashion and a lid speculum was placed. A paracentesis was created with the side port blade and the anterior chamber was filled with viscoelastic. A near clear corneal incision was performed with the steel keratome. A continuous curvilinear capsulorrhexis was performed with a cystotome followed by the capsulorrhexis forceps. Hydrodissection and hydrodelineation were carried out with BSS on a blunt cannula. The lens was removed in a stop and chop  technique and the remaining cortical material was removed with the irrigation-aspiration handpiece. The capsular bag was inflated with viscoelastic and the Technis [id number] lens was placed in the capsular bag without complication. The remaining viscoelastic was removed from the eye with the irrigation-aspiration handpiece. The wounds were hydrated. The anterior chamber was flushed with Miostat and the eye was inflated to physiologic pressure. 0.27ml Vigamox was placed in the anterior chamber. The wounds were found to be water tight. The eye was dressed with Vigamox. The patient was given protective glasses to wear throughout the day and a shield with  which to sleep tonight. The patient was also given drops with which to begin a drop regimen today and will follow-up with me in one day. Implant Name Type Inv. Item Serial No. Manufacturer Lot No. LRB No. Used  LENS IOL DIOP 24.0 - [id number] Intraocular Lens LENS IOL DIOP 24.0 [id number] AMO  Left 1    Procedure(s) with comments: CATARACT EXTRACTION PHACO AND INTRAOCULAR LENS PLACEMENT (IOC) (Left) - [place] 00:35 AP% 9.6 CDE 3.47 Fluid pack lot # [id number]  Electronically signed: [place] 02/23/2017 9:17 AM

## 2017-02-23 NOTE — Transfer of Care (Signed)
Immediate Anesthesia Transfer of Care Note  Patient: Stephanie [name]  Procedure(s) Performed: CATARACT EXTRACTION PHACO AND INTRAOCULAR LENS PLACEMENT ([place]) (Left Eye)  Patient Location: PACU  Anesthesia Type:MAC  Level of Consciousness: awake and alert   Airway & Oxygen Therapy: Patient Spontanous Breathing  Post-op Assessment: Report given to RN and Post -op Vital signs reviewed and stable  Post vital signs: Reviewed and stable  Last Vitals:  Vitals:   02/23/17 0811  BP: 119/75  Pulse: 64  Resp: 16  Temp: (!) 36 C    Last Pain:  Vitals:   02/23/17 0811  TempSrc: Tympanic         Complications: No apparent anesthesia complications

## 2017-02-23 NOTE — Anesthesia Postprocedure Evaluation (Signed)
Anesthesia Post Note  Patient: LILYANNAH ZUELKE  Procedure(s) Performed: CATARACT EXTRACTION PHACO AND INTRAOCULAR LENS PLACEMENT ([place]) (Left Eye)  Patient location during evaluation: PACU Anesthesia Type: MAC Level of consciousness: awake and alert and oriented Pain management: pain level controlled Vital Signs Assessment: post-procedure vital signs reviewed and stable Respiratory status: spontaneous breathing, nonlabored ventilation and respiratory function stable Cardiovascular status: blood pressure returned to baseline and stable Postop Assessment: no signs of nausea or vomiting Anesthetic complications: no     Last Vitals:  Vitals:   02/23/17 0918 02/23/17 0925  BP: 131/72 132/85  Pulse: 65 (!) 57  Resp: 16   Temp: 36.6 C   SpO2: 100% 100%    Last Pain:  Vitals:   02/23/17 0811  TempSrc: Tympanic                 Tonya Carlile

## 2017-02-23 NOTE — H&P (Signed)
All labs reviewed. Abnormal studies sent to patients PCP when indicated.  Previous H&P reviewed, patient examined, there are NO CHANGES.  Stephanie [name] LOUIS12/18/20188:46 AM

## 2017-02-23 NOTE — Anesthesia Post-op Follow-up Note (Signed)
Anesthesia QCDR form completed.        

## 2017-02-23 NOTE — Discharge Instructions (Signed)
Eye Surgery Discharge Instructions  Expect mild scratchy sensation or mild soreness. DO NOT RUB YOUR EYE!  The day of surgery:  Minimal physical activity, but bed rest is not required  No reading, computer work, or close hand work  No bending, lifting, or straining.  May watch TV  For 24 hours:  No driving, legal decisions, or alcoholic beverages  Safety precautions  Eat anything you prefer: It is better to start with liquids, then soup then solid foods.  _____ Eye patch should be worn until postoperative exam tomorrow.  ____ Solar shield eyeglasses should be worn for comfort in the sunlight/patch while sleeping  Resume all regular medications including aspirin or Coumadin if these were discontinued prior to surgery. You may shower, bathe, shave, or wash your hair. Tylenol may be taken for mild discomfort.  Call your doctor if you experience significant pain, nausea, or vomiting, fever > 101 or other signs of infection. [phone] or [phone] Specific instructions:  Follow-up Information    Birder Robson, MD Follow up.   Specialty:  Ophthalmology Why:  December 19 at 9:35am Contact information: [address] [place] [address] [phone]

## 2017-03-11 ENCOUNTER — Ambulatory Visit (INDEPENDENT_AMBULATORY_CARE_PROVIDER_SITE_OTHER): Payer: Medicare Other | Admitting: Psychiatry

## 2017-03-11 ENCOUNTER — Encounter: Payer: Self-pay | Admitting: Psychiatry

## 2017-03-11 ENCOUNTER — Other Ambulatory Visit: Payer: Self-pay

## 2017-03-11 DIAGNOSIS — F3110 Bipolar disorder, current episode manic without psychotic features, unspecified: Secondary | ICD-10-CM | POA: Diagnosis not present

## 2017-03-11 MED ORDER — TRAZODONE HCL 50 MG PO TABS: 50.0000 mg | ORAL_TABLET | Freq: Every evening | ORAL | 1 refills | Status: DC | PRN

## 2017-03-11 MED ORDER — DIVALPROEX SODIUM ER 500 MG PO TB24: 500.0000 mg | ORAL_TABLET | Freq: Two times a day (BID) | ORAL | 1 refills | Status: DC

## 2017-03-11 MED ORDER — GABAPENTIN 300 MG PO CAPS: 300.0000 mg | ORAL_CAPSULE | Freq: Two times a day (BID) | ORAL | 1 refills | Status: DC

## 2017-03-11 MED ORDER — CARBAMAZEPINE ER 300 MG PO CP12: 300.0000 mg | ORAL_CAPSULE | Freq: Two times a day (BID) | ORAL | 1 refills | Status: DC

## 2017-03-11 NOTE — Progress Notes (Signed)
[facility] MD OP Progress Note  03/12/2017 8:52 AM NURIYA STUCK  MRN:  [id number]  Chief Complaint: ' I am ok."  Chief Complaint    Follow-up; Medication Refill     HPI: Roopa is a 72 year old Caucasian female who is divorced, who lives in [place], presented to the clinic today for a follow-up visit.  Mima  presented today after her most recent left-sided cataract surgery.  She reports she is recovering from the same.  She however reports some mild complications after the surgery.  She reports that she had scratched her eyes by mistake and it is taking longer to heal.  She also reports that her other eye is currently most likely having a reaction to one of the medications that she is putting in them and she is struggling with some itching.  She however reports that she was recently released to drive by her PMD.  She reports she got support from her friend, as well as her son after the surgery.  She did not get established with [employer] or any kind of home health aide yet.  Discussed that she can be referred back to social worker here in clinic to discuss.  She is currently compliant on her medications.  She denies any side effects to them.  She reports that changing the carbamazepine to extended release helped her a lot and she did not have any more falls after that.  She does report some sleep issues on and off.  She reports she is currently on 50 mg of trazodone.  Discussed with her that we can increase it with an option to go up to 75 mg on the nights that she needs a higher dose.  She agrees with plan.  She reports she was emotionally distressed during the surgery and couple of days after because of the severity of the pain she had after surgery.  She however reports that she currently feels better.  She has a follow-up appointment with her provider on January 11.  Patient appears to be alert oriented and was able to give information about her medications to Probation officer.  She did not appear to  have any cognitive deficits.  She denies any suicidality or homicidality.  She denies any perceptual disturbances.  She is requesting a 90-day supply of all her medications today. Visit Diagnosis:    ICD-10-CM   1. Bipolar I disorder, most recent episode (or current) manic (HCC) F31.10 Carbamazepine (EQUETRO) 300 MG CP12    divalproex (DEPAKOTE ER) 500 MG 24 hr tablet    gabapentin (NEURONTIN) 300 MG capsule    traZODone (DESYREL) 50 MG tablet    Past Psychiatric History: History of bipolar disorder since the past several years.  Past Medical History:  Past Medical History:  Diagnosis Date  . Anginal pain ([place])   . Anxiety   . Bipolar 1 disorder ([place])   . Bipolar disorder ([place])   . Coronary artery disease   . Depression   . Fatigue   . Fibromyalgia   . GERD (gastroesophageal reflux disease)   . Heart disease   . Heart failure ([place])   . HLD (hyperlipidemia)   . Hypothyroidism   . IBS (irritable bowel syndrome)   . Pelvic pain in female   . Perianal lesion    high grade sil lesion- keratinizing type  . RA (rheumatoid arthritis) ([place])   . Raynaud phenomenon   . Sjogren's disease ([place])   . Sleep apnea   . Vaginal Pap  smear, abnormal     Past Surgical History:  Procedure Laterality Date  . CATARACT EXTRACTION W/PHACO Left 02/23/2017   Procedure: CATARACT EXTRACTION PHACO AND INTRAOCULAR LENS PLACEMENT (IOC);  Surgeon: Birder Robson, MD;  Location: [facility] ORS;  Service: Ophthalmology;  Laterality: Left;  [place] 00:35 AP% 9.6 CDE 3.47 Fluid pack lot # [id number]  . EYE SURGERY    . TONSILLECTOMY      Family Psychiatric History: Mother-bipolar disorder, sister-depression.  Family History:  Family History  Problem Relation Age of Onset  . Heart failure Father   . Parkinson's disease Mother   . Bipolar disorder Mother   . Depression Sister   . Schizophrenia Paternal Grandmother   . Diabetes Maternal Grandmother   . Pancreatic cancer Maternal Grandmother   . Cancer Neg  Hx   . Heart disease Neg Hx    Substance abuse history: Denies   Social History: Divorced, has 2 adult children, a son who lives in [place] and a daughter who lives in [place].  She lives alone in [place].  Has supportive friends. Social History   Socioeconomic History  . Marital status: Divorced    Spouse name: None  . Number of children: 2  . Years of education: None  . Highest education level: Master's degree (e.g., MA, MS, MEng, MEd, MSW, MBA)  Social Needs  . Financial resource strain: Somewhat hard  . Food insecurity - worry: Never true  . Food insecurity - inability: Never true  . Transportation needs - medical: No  . Transportation needs - non-medical: No  Occupational History    Comment: retired2  Tobacco Use  . Smoking status: Never Smoker  . Smokeless tobacco: Former Network engineer and Sexual Activity  . Alcohol use: No    Alcohol/week: 0.0 oz  . Drug use: No  . Sexual activity: No    Birth control/protection: Post-menopausal  Other Topics Concern  . None  Social History Narrative  . None    Allergies:  Allergies  Allergen Reactions  . Amoxicillin-Pot Clavulanate Nausea Only and Other (See Comments)    Diarrhea Has patient had a PCN reaction causing immediate rash, facial/tongue/throat swelling, SOB or lightheadedness with hypotension:  Has patient had a PCN reaction causing severe rash involving mucus membranes or skin necrosis: Has patient had a PCN reaction that required hospitalization:  Has patient had a PCN reaction occurring within the last 10 years: If all of the above answers are "NO", then may proceed with Cephalosporin use.   Marland Kitchen Oxycodone-Acetaminophen Nausea Only  . Erythromycin Nausea Only  . Azithromycin Nausea And Vomiting  . Codeine   . Macrolides And Ketolides   . Oxycodone-Acetaminophen   . Penicillins Other (See Comments)    Has patient had a PCN reaction causing immediate rash, facial/tongue/throat swelling, SOB or  lightheadedness with hypotension: NO Has patient had a PCN reaction causing severe rash involving mucus membranes or skin necrosis: NO Has patient had a PCN reaction that required hospitalization: NO Has patient had a PCN reaction occurring within the last 10 years: NO If all of the above answers are "NO", then may proceed with Cephalosporin use.   . Tramadol Nausea And Vomiting  . Lamotrigine Rash  . Sulfa Antibiotics Rash    Metabolic Disorder Labs: No results found for: HGBA1C, MPG No results found for: PROLACTIN No results found for: CHOL, TRIG, HDL, CHOLHDL, VLDL, LDLCALC No results found for: TSH  Therapeutic Level Labs: No results found for: LITHIUM Lab Results  Component  Value Date   VALPROATE 58 02/22/2017   No components found for:  CBMZ  Current Medications: Current Outpatient Medications  Medication Sig Dispense Refill  . aspirin 81 MG tablet Take 81 mg by mouth daily.     . Calcium Citrate-Vitamin D ([employer] CALCIUM CITRATE +D3 MINI) 200-250 MG-UNIT TABS Take 1 tablet by mouth daily. AFTER BREAKFAST    . Carbamazepine (EQUETRO) 300 MG CP12 Take 1 capsule (300 mg total) by mouth 2 (two) times daily. 180 each 1  . divalproex (DEPAKOTE ER) 500 MG 24 hr tablet Take 1 tablet (500 mg total) by mouth 2 (two) times daily. Take 1 tablet in the morning and 1 tablet at nighttime 277 tablet 1  . folic acid (FOLVITE) 1 MG tablet Take 2 mg by mouth daily.     Marland Kitchen gabapentin (NEURONTIN) 300 MG capsule Take 1 capsule (300 mg total) by mouth 2 (two) times daily. 180 capsule 1  . levothyroxine (SYNTHROID, LEVOTHROID) 75 MCG tablet TAKE 1 TABLET BY MOUTH EACH DAY ON EMPTYSTOMACH WITH A GLASS OF WATERAT LEAST 30 TO 60 MINUTES BEFORE BREAKFAST IN THE MORNING    . methotrexate 2.5 MG tablet Take 15 mg by mouth once a week. WEDNESDAYS    . Multiple Vitamins-Minerals (MULTIVITAMIN WITH MINERALS) tablet Take 1 tablet by mouth daily.    . nitroGLYCERIN (NITROSTAT) 0.4 MG SL tablet Place 0.4 mg  under the tongue every 5 (five) minutes as needed.     . ondansetron (ZOFRAN) 8 MG tablet TAKE 1 TABLET BY MOUTH EVERY 12 HOURS ASNEEDED FOR NAUSEA    . Polyethyl Glycol-Propyl Glycol (SYSTANE) 0.4-0.3 % GEL ophthalmic gel Place 1 application into both eyes 2 (two) times daily. Equate Brand    . pravastatin (PRAVACHOL) 10 MG tablet Take 10 mg by mouth every evening.     . ranitidine (ZANTAC) 150 MG tablet Take 150 mg by mouth 2 (two) times daily.     . traZODone (DESYREL) 50 MG tablet Take 1-1.5 tablets (50-75 mg total) by mouth at bedtime as needed for sleep. 135 tablet 1   No current facility-administered medications for this visit.      Musculoskeletal: Strength & Muscle Tone: within normal limits Gait & Station: normal Patient leans: N/A  Psychiatric Specialty Exam: Review of Systems  Psychiatric/Behavioral: The patient is nervous/anxious.   All other systems reviewed and are negative.   Blood pressure 126/89, pulse 71, temperature (!) 97.5 F (36.4 C), temperature source Oral, weight 101 lb 9.6 oz (46.1 kg).Body mass index is 19.84 kg/m.  General Appearance: Casual  Eye Contact:  Fair  Speech:  Clear and Coherent  Volume:  Normal  Mood:  Anxious  Affect:  Appropriate  Thought Process:  Goal Directed and Descriptions of Associations: Intact  Orientation:  Full (Time, Place, and Person)  Thought Content: Logical   Suicidal Thoughts:  No  Homicidal Thoughts:  No  Memory:  Immediate;   Fair Recent;   Fair Remote;   Fair  Judgement:  Fair  Insight:  Fair  Psychomotor Activity:  Normal  Concentration:  Concentration: Fair and Attention Span: Fair  Recall:  [employer] of Knowledge: Fair  Language: Fair  Akathisia:  No  Handed:  Right  AIMS (if indicated): NA  Assets:  Communication Skills Desire for Improvement Transportation  ADL's:  Intact  Cognition: WNL  Sleep:  Fair, Interrupted at times   Screenings:   Assessment and Plan: Jaci is a 72 year old  Caucasian female who is divorced,  lives in [place], has a history of bipolar disorder, multiple medical problems, status post unilateral cataract surgery, presented to the clinic today for a follow-up visit.  Patient today is recovering from her eye surgery, continues to have some struggles with the same.  She is otherwise doing well.  Discussed plan with patient.  Also discussed that she can be referred back to the social worker here to discuss community resources.  Plan as noted.  Plan For bipolar disorder Continue carbamazepine extended release 300 mg p.o. twice daily.Most recent carbamazepine level- 5.6 ug/ml -therapeutic ( 02/22/2017) Continue Depakote 500 mg p.o. twice daily.  Recent Depakote level-58 mcg/mL-therapeutic ( 02/22/2017) Continue Neurontin 300 mg p.o. twice daily.   Insomnia Increase trazodone 50-75 mg p.o. nightly as needed.  Vital signs reviewed-within normal  After this evaluation she will discuss community resources-[facility] on [facility] health care resources with Ms. Elmyra Ricks Peacock-CSW here in clinic.  I have also reached out to her pharmacist-Chris for a 90-day supply.  Patient reassured.  Follow-up in clinic in 3 months or sooner if needed.  More than 50 % of the time was spent for psychoeducation and supportive psychotherapy and care coordination.  This note was generated in part or whole with voice recognition software. Voice recognition is usually quite accurate but there are transcription errors that can and very often do occur. I apologize for any typographical errors that were not detected and corrected.       Ursula Alert, MD 03/12/2017, 8:52 AM

## 2017-03-15 ENCOUNTER — Telehealth: Payer: Self-pay | Admitting: Licensed Clinical Social Worker

## 2017-03-15 NOTE — Telephone Encounter (Signed)
Ok thanks for letting me know

## 2017-03-25 ENCOUNTER — Encounter: Payer: Self-pay | Admitting: *Deleted

## 2017-03-30 ENCOUNTER — Ambulatory Visit
Admission: RE | Admit: 2017-03-30 | Discharge: 2017-03-30 | Disposition: A | Discharge status: Home / Self Care | Payer: Medicare Other | Source: Ambulatory Visit | Attending: Ophthalmology | Admitting: Ophthalmology

## 2017-03-30 ENCOUNTER — Ambulatory Visit: Payer: Medicare Other | Admitting: Registered Nurse

## 2017-03-30 ENCOUNTER — Encounter: Payer: Self-pay | Admitting: *Deleted

## 2017-03-30 ENCOUNTER — Other Ambulatory Visit: Payer: Self-pay

## 2017-03-30 ENCOUNTER — Encounter
Admission: RE | Disposition: A | Discharge status: Home / Self Care | Payer: Self-pay | Source: Ambulatory Visit | Attending: Ophthalmology

## 2017-03-30 DIAGNOSIS — Z79899 Other long term (current) drug therapy: Secondary | ICD-10-CM | POA: Diagnosis not present

## 2017-03-30 DIAGNOSIS — M069 Rheumatoid arthritis, unspecified: Secondary | ICD-10-CM | POA: Insufficient documentation

## 2017-03-30 DIAGNOSIS — E7800 Pure hypercholesterolemia, unspecified: Secondary | ICD-10-CM | POA: Insufficient documentation

## 2017-03-30 DIAGNOSIS — H2511 Age-related nuclear cataract, right eye: Secondary | ICD-10-CM | POA: Diagnosis present

## 2017-03-30 DIAGNOSIS — F329 Major depressive disorder, single episode, unspecified: Secondary | ICD-10-CM | POA: Diagnosis not present

## 2017-03-30 DIAGNOSIS — E039 Hypothyroidism, unspecified: Secondary | ICD-10-CM | POA: Insufficient documentation

## 2017-03-30 DIAGNOSIS — I10 Essential (primary) hypertension: Secondary | ICD-10-CM | POA: Diagnosis not present

## 2017-03-30 DIAGNOSIS — Z7982 Long term (current) use of aspirin: Secondary | ICD-10-CM | POA: Diagnosis not present

## 2017-03-30 DIAGNOSIS — K219 Gastro-esophageal reflux disease without esophagitis: Secondary | ICD-10-CM | POA: Insufficient documentation

## 2017-03-30 DIAGNOSIS — I2510 Atherosclerotic heart disease of native coronary artery without angina pectoris: Secondary | ICD-10-CM | POA: Insufficient documentation

## 2017-03-30 DIAGNOSIS — G4730 Sleep apnea, unspecified: Secondary | ICD-10-CM | POA: Diagnosis not present

## 2017-03-30 HISTORY — DX: Unspecified hearing loss, unspecified ear: H91.90

## 2017-03-30 HISTORY — PX: CATARACT EXTRACTION W/PHACO: SHX586

## 2017-03-30 SURGERY — PHACOEMULSIFICATION, CATARACT, WITH IOL INSERTION
Anesthesia: Monitor Anesthesia Care | Site: Eye | Laterality: Right | Wound class: Clean

## 2017-03-30 MED ORDER — MOXIFLOXACIN HCL 0.5 % OP SOLN
OPHTHALMIC | Status: DC | PRN
  Administered 2017-03-30: 0.2 mL via OPHTHALMIC

## 2017-03-30 MED ORDER — NA CHONDROIT SULF-NA HYALURON 40-17 MG/ML IO SOLN
INTRAOCULAR | Status: DC | PRN
  Administered 2017-03-30: 1 mL via INTRAOCULAR

## 2017-03-30 MED ORDER — SODIUM CHLORIDE 0.9 % IV SOLN
INTRAVENOUS | Status: DC
  Administered 2017-03-30: 10:00:00 via INTRAVENOUS

## 2017-03-30 MED ORDER — FENTANYL CITRATE (PF) 100 MCG/2ML IJ SOLN
INTRAMUSCULAR | Status: DC | PRN
  Administered 2017-03-30: 25 ug via INTRAVENOUS

## 2017-03-30 MED ORDER — ONDANSETRON HCL 4 MG/2ML IJ SOLN: 4.0000 mg | Freq: Once | INTRAMUSCULAR | Status: DC | PRN

## 2017-03-30 MED ORDER — NA CHONDROIT SULF-NA HYALURON 40-17 MG/ML IO SOLN
INTRAOCULAR | Status: AC
  Filled 2017-03-30: qty 1

## 2017-03-30 MED ORDER — CARBACHOL 0.01 % IO SOLN
INTRAOCULAR | Status: DC | PRN
  Administered 2017-03-30: 0.5 mL via INTRAOCULAR

## 2017-03-30 MED ORDER — MOXIFLOXACIN HCL 0.5 % OP SOLN
OPHTHALMIC | Status: AC
  Filled 2017-03-30: qty 3

## 2017-03-30 MED ORDER — POVIDONE-IODINE 5 % OP SOLN
OPHTHALMIC | Status: AC
  Filled 2017-03-30: qty 30

## 2017-03-30 MED ORDER — FENTANYL CITRATE (PF) 100 MCG/2ML IJ SOLN
INTRAMUSCULAR | Status: AC
  Filled 2017-03-30: qty 2

## 2017-03-30 MED ORDER — MIDAZOLAM HCL 2 MG/2ML IJ SOLN
INTRAMUSCULAR | Status: AC
  Filled 2017-03-30: qty 2

## 2017-03-30 MED ORDER — ARMC OPHTHALMIC DILATING DROPS
1.0000 "application " | OPHTHALMIC | Status: AC
  Administered 2017-03-30 (×3): 1 via OPHTHALMIC

## 2017-03-30 MED ORDER — MOXIFLOXACIN HCL 0.5 % OP SOLN: 1.0000 [drp] | OPHTHALMIC | Status: DC | PRN

## 2017-03-30 MED ORDER — LIDOCAINE HCL (PF) 4 % IJ SOLN
INTRAMUSCULAR | Status: AC
  Filled 2017-03-30: qty 5

## 2017-03-30 MED ORDER — EPINEPHRINE PF 1 MG/ML IJ SOLN
INTRAOCULAR | Status: DC | PRN
  Administered 2017-03-30: 11:00:00 via OPHTHALMIC

## 2017-03-30 MED ORDER — POVIDONE-IODINE 5 % OP SOLN
OPHTHALMIC | Status: DC | PRN
  Administered 2017-03-30: 1 via OPHTHALMIC

## 2017-03-30 MED ORDER — EPINEPHRINE PF 1 MG/ML IJ SOLN
INTRAMUSCULAR | Status: AC
  Filled 2017-03-30: qty 2

## 2017-03-30 MED ORDER — MIDAZOLAM HCL 2 MG/2ML IJ SOLN
INTRAMUSCULAR | Status: DC | PRN
  Administered 2017-03-30: 1 mg via INTRAVENOUS

## 2017-03-30 MED ORDER — BSS IO SOLN
INTRAOCULAR | Status: DC | PRN
  Administered 2017-03-30: 4 mL via OPHTHALMIC

## 2017-03-30 MED ORDER — ARMC OPHTHALMIC DILATING DROPS
OPHTHALMIC | Status: AC
  Filled 2017-03-30: qty 0.4

## 2017-03-30 SURGICAL SUPPLY — 16 items
GLOVE BIO SURGEON STRL SZ8 (GLOVE) ×3 IMPLANT
GLOVE BIOGEL M 6.5 STRL (GLOVE) ×3 IMPLANT
GLOVE SURG LX 8.0 MICRO (GLOVE) ×2
GLOVE SURG LX STRL 8.0 MICRO (GLOVE) ×1 IMPLANT
GOWN STRL REUS W/ TWL LRG LVL3 (GOWN DISPOSABLE) ×2 IMPLANT
GOWN STRL REUS W/TWL LRG LVL3 (GOWN DISPOSABLE) ×4
LABEL CATARACT MEDS ST (LABEL) ×3 IMPLANT
LENS IOL TECNIS ITEC 24.0 (Intraocular Lens) ×3 IMPLANT
PACK CATARACT (MISCELLANEOUS) ×3 IMPLANT
PACK CATARACT BRASINGTON LX (MISCELLANEOUS) ×3 IMPLANT
PACK EYE AFTER SURG (MISCELLANEOUS) ×3 IMPLANT
SOL BSS BAG (MISCELLANEOUS) ×3
SOLUTION BSS BAG (MISCELLANEOUS) ×1 IMPLANT
SYR 5ML LL (SYRINGE) ×3 IMPLANT
WATER STERILE IRR 250ML POUR (IV SOLUTION) ×3 IMPLANT
WIPE NON LINTING 3.25X3.25 (MISCELLANEOUS) ×3 IMPLANT

## 2017-03-30 NOTE — Op Note (Signed)
PREOPERATIVE DIAGNOSIS:  Nuclear sclerotic cataract of the right eye.   POSTOPERATIVE DIAGNOSIS:  NUCLEAR SCLEROTIC CATARACT RIGHT EYE   OPERATIVE PROCEDURE: Procedure(s): CATARACT EXTRACTION PHACO AND INTRAOCULAR LENS PLACEMENT ([facility])   SURGEON:  Birder Robson, MD.   ANESTHESIA:  Anesthesiologist: Gunnar Fusi, MD CRNA: Hedda Slade, CRNA  1.      Managed anesthesia care. 2.      0.39ml of Shugarcaine was instilled in the eye following the paracentesis.   COMPLICATIONS:  None.   TECHNIQUE:   Stop and chop   DESCRIPTION OF PROCEDURE:  The patient was examined and consented in the preoperative holding area where the aforementioned topical anesthesia was applied to the right eye and then brought back to the Operating Room where the right eye was prepped and draped in the usual sterile ophthalmic fashion and a lid speculum was placed. A paracentesis was created with the side port blade and the anterior chamber was filled with viscoelastic. A near clear corneal incision was performed with the steel keratome. A continuous curvilinear capsulorrhexis was performed with a cystotome followed by the capsulorrhexis forceps. Hydrodissection and hydrodelineation were carried out with BSS on a blunt cannula. The lens was removed in a stop and chop  technique and the remaining cortical material was removed with the irrigation-aspiration handpiece. The capsular bag was inflated with viscoelastic and the Technis [id number]  lens was placed in the capsular bag without complication. The remaining viscoelastic was removed from the eye with the irrigation-aspiration handpiece. The wounds were hydrated. The anterior chamber was flushed with Miostat and the eye was inflated to physiologic pressure. 0.50ml of Vigamox was placed in the anterior chamber. The wounds were found to be water tight. The eye was dressed with Vigamox. The patient was given protective glasses to wear throughout the day and a shield with which  to sleep tonight. The patient was also given drops with which to begin a drop regimen today and will follow-up with me in one day. Implant Name Type Inv. Item Serial No. Manufacturer Lot No. LRB No. Used  LENS IOL DIOP 24.0 - [id number] Intraocular Lens LENS IOL DIOP 24.0 [id number]  Right 1   Procedure(s) with comments: CATARACT EXTRACTION PHACO AND INTRAOCULAR LENS PLACEMENT (IOC) (Right) - [place] 00:32.6 AP% 13.8 CDE 4.51 Fluid Pack Lot # [id number]  Electronically signed: Birder Robson 03/30/2017 10:51 AM

## 2017-03-30 NOTE — H&P (Signed)
All labs reviewed. Abnormal studies sent to patients PCP when indicated.  Previous H&P reviewed, patient examined, there are NO CHANGES.  Stephanie [name] Porfilio1/22/201910:25 AM

## 2017-03-30 NOTE — Anesthesia Preprocedure Evaluation (Signed)
Anesthesia Evaluation  Patient identified by MRN, date of birth, ID band Patient awake    Reviewed: Allergy & Precautions, NPO status , Patient's Chart, lab work & pertinent test results  History of Anesthesia Complications Negative for: history of anesthetic complications  Airway Mallampati: II       Dental   Pulmonary sleep apnea (mild, no CPAP) , neg COPD,           Cardiovascular hypertension, Pt. on medications + CAD  (-) Past MI and (-) CHF (-) dysrhythmias (-) Valvular Problems/Murmurs     Neuro/Psych neg Seizures Anxiety Depression Bipolar Disorder    GI/Hepatic Neg liver ROS, GERD  Medicated and Controlled,  Endo/Other  neg diabetesHypothyroidism   Renal/GU negative Renal ROS     Musculoskeletal   Abdominal   Peds  Hematology   Anesthesia Other Findings   Reproductive/Obstetrics                             Anesthesia Physical Anesthesia Plan  ASA: III  Anesthesia Plan: MAC   Post-op Pain Management:    Induction: Intravenous  PONV Risk Score and Plan:   Airway Management Planned: Nasal Cannula  Additional Equipment:   Intra-op Plan:   Post-operative Plan:   Informed Consent: I have reviewed the patients History and Physical, chart, labs and discussed the procedure including the risks, benefits and alternatives for the proposed anesthesia with the patient or authorized representative who has indicated his/her understanding and acceptance.     Plan Discussed with:   Anesthesia Plan Comments:         Anesthesia Quick Evaluation

## 2017-03-30 NOTE — Anesthesia Post-op Follow-up Note (Signed)
Anesthesia QCDR form completed.        

## 2017-03-30 NOTE — Anesthesia Postprocedure Evaluation (Signed)
Anesthesia Post Note  Patient: Stephanie [name]  Procedure(s) Performed: CATARACT EXTRACTION PHACO AND INTRAOCULAR LENS PLACEMENT ([place]) (Right Eye)  Patient location during evaluation: PACU Anesthesia Type: MAC Level of consciousness: awake Pain management: pain level controlled Vital Signs Assessment: post-procedure vital signs reviewed and stable Respiratory status: spontaneous breathing Cardiovascular status: blood pressure returned to baseline Postop Assessment: no apparent nausea or vomiting Anesthetic complications: no     Last Vitals:  Vitals:   03/30/17 1051 03/30/17 1053  BP: 116/71 116/71  Pulse: 66 68  Resp: 16 15  Temp: 36.6 C 36.6 C  SpO2: 98% 98%    Last Pain:  Vitals:   03/30/17 0916  TempSrc: Oral                 Hedda Slade

## 2017-03-30 NOTE — Transfer of Care (Signed)
Immediate Anesthesia Transfer of Care Note  Patient: Stephanie [name]  Procedure(s) Performed: CATARACT EXTRACTION PHACO AND INTRAOCULAR LENS PLACEMENT (IOC) (Right Eye)  Patient Location: PACU  Anesthesia Type:MAC  Level of Consciousness: awake, alert  and oriented  Airway & Oxygen Therapy: Patient Spontanous Breathing  Post-op Assessment: Report given to RN and Post -op Vital signs reviewed and stable  Post vital signs: Reviewed and stable  Last Vitals:  Vitals:   03/30/17 1051 03/30/17 1053  BP: 116/71 116/71  Pulse: 66 68  Resp: 16 15  Temp: 36.6 C 36.6 C  SpO2: 98% 98%    Last Pain:  Vitals:   03/30/17 0916  TempSrc: Oral         Complications: No apparent anesthesia complications

## 2017-03-30 NOTE — Discharge Instructions (Signed)
FOLLOW DR. PORFILIO'S POSTOP EYE DROP INSTRUCTION SHEET AS REVIEWED.  Eye Surgery Discharge Instructions  Expect mild scratchy sensation or mild soreness. DO NOT RUB YOUR EYE!  The day of surgery:  Minimal physical activity, but bed rest is not required  No reading, computer work, or close hand work  No bending, lifting, or straining.  May watch TV  For 24 hours:  No driving, legal decisions, or alcoholic beverages  Safety precautions  Eat anything you prefer: It is better to start with liquids, then soup then solid foods.  _____ Eye patch should be worn until postoperative exam tomorrow.  ____ Solar shield eyeglasses should be worn for comfort in the sunlight/patch while sleeping  Resume all regular medications including aspirin or Coumadin if these were discontinued prior to surgery. You may shower, bathe, shave, or wash your hair. Tylenol may be taken for mild discomfort.  Call your doctor if you experience significant pain, nausea, or vomiting, fever > 101 or other signs of infection. [phone] or [phone] Specific instructions:  Follow-up Information    Birder Robson, MD Follow up.   Specialty:  Ophthalmology Why:  03/31/17 @ 10:25 am Contact information: [place] [id number] [phone]

## 2017-04-14 ENCOUNTER — Ambulatory Visit
Admission: RE | Admit: 2017-04-14 | Discharge: 2017-04-14 | Disposition: A | Discharge status: Home / Self Care | Payer: Medicare Other | Source: Ambulatory Visit | Attending: Family Medicine | Admitting: Family Medicine

## 2017-04-14 DIAGNOSIS — Z1231 Encounter for screening mammogram for malignant neoplasm of breast: Secondary | ICD-10-CM | POA: Insufficient documentation

## 2017-05-03 ENCOUNTER — Encounter: Payer: Self-pay | Admitting: Psychiatry

## 2017-05-03 ENCOUNTER — Ambulatory Visit: Payer: Medicare Other | Admitting: Psychiatry

## 2017-05-03 ENCOUNTER — Other Ambulatory Visit: Payer: Self-pay

## 2017-05-03 VITALS — BP 135/89 | HR 81 | Temp 97.9°F | Wt 108.4 lb

## 2017-05-03 DIAGNOSIS — F3110 Bipolar disorder, current episode manic without psychotic features, unspecified: Secondary | ICD-10-CM | POA: Diagnosis not present

## 2017-05-03 MED ORDER — CARBAMAZEPINE ER 200 MG PO TB12: 400.0000 mg | ORAL_TABLET | Freq: Two times a day (BID) | ORAL | 1 refills | Status: DC

## 2017-05-03 NOTE — Patient Instructions (Signed)
Please get your carbamazepine level drawn in 5 days after starting your new dosage . Also please do not take your carbamazepine that AM , you have to wait until you get your test done that day to take your morning dose .

## 2017-05-03 NOTE — Progress Notes (Signed)
Stephanie [name]  05/03/2017 12:44 PM Stephanie [name]  MRN:  [id number]  Chief Complaint: ' I am manic." Chief Complaint    Medication Problem; Manic Behavior     HPI: Stephanie [name] is a 72 yr old CF, who is divorced, who lives in [place] , presented to the clinic today for a follow up visit.  Stephanie [name] today presented for a follow-up visit.  She reports she has been manic since the past few days.  She reports she kind of gets this way when the weather changes.  She believes she has seasonal affective disorder.  She reports her energy level as high.  She reports she is unable to focus or concentrate.  She reports her sleep is restless.  She reports she rests better when she takes trazodone 75 mg at bedtime.  She has been compliant on the carbamazepine extended release 300 mg twice a day.  However since she is manic discussed about increasing her dose.  She agrees with plan.  She denies any side effects to the carbamazepine like dizziness, falls and so on.  She reports changing her medication to extended release helped her a lot.  Discussed with her that since she is manic may be the dosage increase might help.  She agrees with plan.  She reports good support system from her friends and children.  She is also happy that she is currently on the waiting list for Meals on Wheels .  She also reports she is volunteering at church, working with children's fellowship group and that has been going well.   Visit Diagnosis:    ICD-10-CM   1. Bipolar I disorder, most recent episode (or current) manic (HCC) F31.10 carbamazepine (TEGRETOL-XR) 200 MG 12 hr tablet    Past Psychiatric History: Hx of Bipolar disorder since the past several years.  Past Medical History:  Past Medical History:  Diagnosis Date  . Anginal pain ([place])   . Anxiety   . Bipolar 1 disorder ([place])   . Bipolar disorder ([place])   . Coronary artery disease   . Depression   . Fatigue   . Fibromyalgia   . GERD (gastroesophageal reflux  disease)   . Heart disease   . Heart failure ([place])   . HLD (hyperlipidemia)   . HOH (hard of hearing)   . Hypothyroidism   . IBS (irritable bowel syndrome)   . Pelvic pain in female   . Perianal lesion    high grade sil lesion- keratinizing type  . RA (rheumatoid arthritis) ([place])   . Raynaud phenomenon   . Sjogren's disease ([place])   . Sleep apnea   . Vaginal Pap smear, abnormal     Past Surgical History:  Procedure Laterality Date  . CATARACT EXTRACTION W/PHACO Left 02/23/2017   Procedure: CATARACT EXTRACTION PHACO AND INTRAOCULAR LENS PLACEMENT (IOC);  Surgeon: Birder Robson, MD;  Location: [facility] ORS;  Service: Ophthalmology;  Laterality: Left;  [place] 00:35 AP% 9.6 CDE 3.47 Fluid pack lot # [id number]  . CATARACT EXTRACTION W/PHACO Right 03/30/2017   Procedure: CATARACT EXTRACTION PHACO AND INTRAOCULAR LENS PLACEMENT ([place]);  Surgeon: Birder Robson, MD;  Location: [facility] ORS;  Service: Ophthalmology;  Laterality: Right;  [place] 00:32.6 AP% 13.8 CDE 4.51 Fluid Pack Lot # [id number]  . EYE SURGERY    . TONSILLECTOMY      Family Psychiatric History:Mother-Bipolar disorder, sister-depression  Family History:  Family History  Problem Relation Age of Onset  . Heart failure Father   . Parkinson's disease  Mother   . Bipolar disorder Mother   . Depression Sister   . Schizophrenia Paternal Grandmother   . Diabetes Maternal Grandmother   . Pancreatic cancer Maternal Grandmother   . Cancer Neg Hx   . Heart disease Neg Hx    Substance abuse history: Denies  Social History: Divorced, has 2 adult children, a son who lives in [place] and a daughter who lives in [place].  She lives alone in [place].  Has supportive friends Social History   Socioeconomic History  . Marital status: Divorced    Spouse name: None  . Number of children: 2  . Years of education: None  . Highest education level: Master's degree (e.g., MA, MS, MEng, MEd, MSW, MBA)  Social Needs  . Financial resource  strain: Somewhat hard  . Food insecurity - worry: Never true  . Food insecurity - inability: Never true  . Transportation needs - medical: No  . Transportation needs - non-medical: No  Occupational History    Comment: retired2  Tobacco Use  . Smoking status: Never Smoker  . Smokeless tobacco: Former Network engineer and Sexual Activity  . Alcohol use: No    Alcohol/week: 0.0 oz  . Drug use: No  . Sexual activity: No    Birth control/protection: Post-menopausal  Other Topics Concern  . None  Social History Narrative  . None    Allergies:  Allergies  Allergen Reactions  . Amoxicillin-Pot Clavulanate Nausea Only and Other (See Comments)    Diarrhea Has patient had a PCN reaction causing immediate rash, facial/tongue/throat swelling, SOB or lightheadedness with hypotension:  Has patient had a PCN reaction causing severe rash involving mucus membranes or skin necrosis: Has patient had a PCN reaction that required hospitalization:  Has patient had a PCN reaction occurring within the last 10 years: If all of the above answers are "NO", then may proceed with Cephalosporin use.   Marland Kitchen Oxycodone-Acetaminophen Nausea Only  . Erythromycin Nausea Only  . Azithromycin Nausea And Vomiting  . Codeine   . Macrolides And Ketolides   . Oxycodone-Acetaminophen   . Penicillins Other (See Comments)    Has patient had a PCN reaction causing immediate rash, facial/tongue/throat swelling, SOB or lightheadedness with hypotension: NO Has patient had a PCN reaction causing severe rash involving mucus membranes or skin necrosis: NO Has patient had a PCN reaction that required hospitalization: NO Has patient had a PCN reaction occurring within the last 10 years: NO If all of the above answers are "NO", then may proceed with Cephalosporin use.   . Tramadol Nausea And Vomiting  . Lamotrigine Rash    LAMICTAL  . Sulfa Antibiotics Rash    Metabolic Disorder Labs: No results found for: HGBA1C, MPG No  results found for: PROLACTIN No results found for: CHOL, TRIG, HDL, CHOLHDL, VLDL, LDLCALC No results found for: TSH  Therapeutic Level Labs: No results found for: LITHIUM Lab Results  Component Value Date   VALPROATE 58 02/22/2017   No components found for:  CBMZ  Current Medications: Current Outpatient Medications  Medication Sig Dispense Refill  . Abatacept (ORENCIA IV) Inject 1 Dose into the vein every 30 (thirty) days.    Marland Kitchen aspirin 81 MG chewable tablet Chew 81 mg by mouth daily with breakfast.    . Calcium Citrate-Vitamin D ([employer] CALCIUM CITRATE +D3 MINI) 200-250 MG-UNIT TABS Take 1 tablet by mouth daily after breakfast.     . divalproex (DEPAKOTE ER) 500 MG 24 hr tablet Take 1 tablet (  500 mg total) by mouth 2 (two) times daily. Take 1 tablet in the morning and 1 tablet at nighttime (Patient taking differently: Take 500 mg by mouth 2 (two) times daily. BREAKFAST & BEDTIME) 301 tablet 1  . folic acid (FOLVITE) 1 MG tablet Take 2 mg by mouth daily with breakfast.     . gabapentin (NEURONTIN) 300 MG capsule Take 1 capsule (300 mg total) by mouth 2 (two) times daily. (Patient taking differently: Take 300 mg by mouth 2 (two) times daily. LUNCH & BEDTIME.) 180 capsule 1  . levothyroxine (SYNTHROID, LEVOTHROID) 75 MCG tablet TAKE 1 TABLET BY MOUTH EACH DAY ON EMPTYSTOMACH WITH A GLASS OF WATERAT LEAST 30 TO 60 MINUTES BEFORE BREAKFAST IN THE MORNING    . methotrexate 2.5 MG tablet Take 15 mg by mouth every Wednesday. WEDNESDAYS AT LUNCH    . moxifloxacin (VIGAMOX) 0.5 % ophthalmic solution Place 1 drop into the right eye 2 (two) times daily.    . Multiple Vitamins-Minerals (MULTIVITAMIN WITH MINERALS) tablet Take 1 tablet by mouth daily with breakfast.     . nitroGLYCERIN (NITROSTAT) 0.4 MG SL tablet Place 0.4 mg under the tongue every 5 (five) minutes as needed for chest pain.     Marland Kitchen ondansetron (ZOFRAN) 8 MG tablet TAKE 1 TABLET BY MOUTH EVERY 12 HOURS ASNEEDED FOR NAUSEA    . Polyethyl  Glycol-Propyl Glycol (SYSTANE) 0.4-0.3 % GEL ophthalmic gel Place 1 application into both eyes 3 (three) times daily as needed (typically twice daily). Equate Brand     . pravastatin (PRAVACHOL) 10 MG tablet Take 10 mg by mouth at bedtime.     . ranitidine (ZANTAC) 150 MG tablet Take 150 mg by mouth 2 (two) times daily. BREAKFAST & BEDTIME.    . traZODone (DESYREL) 50 MG tablet Take 1-1.5 tablets (50-75 mg total) by mouth at bedtime as needed for sleep. 135 tablet 1  . Zoledronic Acid (RECLAST IV) Inject 1 Dose into the vein.    . carbamazepine (TEGRETOL-XR) 200 MG 12 hr tablet Take 2 tablets (400 mg total) by mouth 2 (two) times daily. 120 tablet 1   No current facility-administered medications for this visit.      Musculoskeletal: Strength & Muscle Tone: within normal limits Gait & Station: normal, uses a cane Patient leans: N/A  Psychiatric Specialty Exam: Review of Systems  Psychiatric/Behavioral: The patient is nervous/anxious and has insomnia.   All other systems reviewed and are negative.   Blood pressure 135/89, pulse 81, temperature 97.9 F (36.6 C), temperature source Oral, weight 108 lb 6.4 oz (49.2 kg).Body mass index is 21.17 kg/m.  General Appearance: Casual  Eye Contact:  Fair  Speech:  Pressured  Volume:  Normal  Mood:  Anxious  Affect:  Congruent  Thought Process:  Goal Directed and Descriptions of Associations: Intact  Orientation:  Full (Time, Place, and Person)  Thought Content: Logical   Suicidal Thoughts:  No  Homicidal Thoughts:  No  Memory:  Immediate;   Fair Recent;   Fair Remote;   Fair  Judgement:  Fair  Insight:  Fair  Psychomotor Activity:  Normal  Concentration:  Concentration: Fair and Attention Span: Fair  Recall:  [employer] of Knowledge: Fair  Language: Fair  Akathisia:  No  Handed:  Right  AIMS (if indicated):na  Assets:  Communication Skills Desire for Improvement Social Support  ADL's:  Intact  Cognition: WNL  Sleep:  Poor    Screenings:   Assessment and Plan: Stephanie [name] is  a 43 year old Caucasian female who is divorced, lives in [place], has a history of bipolar disorder, multiple medical problems, status post unilateral cataract surgery, presented to the clinic today for a follow-up visit.  Patient reports that she is struggling with manic symptoms.  She also has sleep issues.  She continues to have good social support and is motivated to stay compliant with medications.  Plan as noted below.  Plan For bipolar disorder Increase carbamazepine extended release to 400 mg p.o. twice daily Carbamazepine level-5.6 mcg/mL-therapeutic on 02/22/2017.  Provided her lab slips to get new level 5 days after starting her new dose. Continue Depakote 500 mg p.o. twice daily.  Recent Depakote level-58 mcg/mL-therapeutic-02/22/2017. Continue Neurontin 300 mg p.o. twice daily.  Insomnia Continue trazodone 75 mg p.o. nightly as needed.  She reports she sleeps better on the 75 mg.  Discussed with her to keep taking the 75 until her mood gets better.  Vital signs reviewed -within normal limits.  She is currently on waiting list for Meals on Wheels-per referral per Ms.Peacock.  Also reached out to her pharmacist-Todd-[phone]-to give a verbal order about her most recent medication change.  This was done per her request by patient.  Follow up in clinic in 1 month or sooner if needed.  More than 50 % of the time was spent for psychoeducation and supportive psychotherapy and care coordination.  This [name] was generated in part or whole with voice recognition software. Voice recognition is usually quite accurate but there are transcription errors that can and very often do occur. I apologize for any typographical errors that were not detected and corrected.       Ursula Alert, MD 05/03/2017, 12:44 PM

## 2017-05-04 ENCOUNTER — Ambulatory Visit: Payer: Medicare Other | Admitting: Psychiatry

## 2017-05-05 ENCOUNTER — Telehealth: Payer: Self-pay

## 2017-05-05 ENCOUNTER — Telehealth: Payer: Self-pay | Admitting: Psychiatry

## 2017-05-05 NOTE — Telephone Encounter (Signed)
Dr. Shea Evans called pt and pharmacy contacted. Pharmacy does have the correct medication .

## 2017-05-05 NOTE — Telephone Encounter (Signed)
pt called states that she is very nausea, and she feels very sick.  she feels like you need to adjust the medications.  she states she is in the bed with nausea. she needs you to call her.

## 2017-05-05 NOTE — Telephone Encounter (Signed)
Called patient , returned her call. She reports she took more one more dose of tegretol by mistake and feels nauseous. Pt reports she wants to know how to take the new medication. Discussed with her to take tegretol XR 400 mg twice a day and not to take 300 mg anymore. Advised her to go to the nearest ED or to her PMD if she continues to be sick. Also discussed to start back on the medication as prescribed tomorrow evening if she is better.

## 2017-05-05 NOTE — Telephone Encounter (Signed)
[facility] called and they confirmed medication.

## 2017-05-05 NOTE — Telephone Encounter (Signed)
Thank you :)

## 2017-05-05 NOTE — Telephone Encounter (Signed)
pt states she returning call to dr. Shea Evans.  please call her on the cell phone [phone].

## 2017-05-05 NOTE — Telephone Encounter (Signed)
Called patient left voice mail to call me back

## 2017-05-06 ENCOUNTER — Telehealth: Payer: Self-pay

## 2017-05-06 NOTE — Telephone Encounter (Signed)
Tc on 05-06-17 @ 9:36 left a message on patient voice mail .

## 2017-05-06 NOTE — Telephone Encounter (Signed)
Stephanie [name] , please let her know not to take any tegretol today  if she continues to be nauseous . Please advise to go to PMD . If she is back to feeling better tomorrow , she can start back the medication tomorrow morning.

## 2017-05-06 NOTE — Telephone Encounter (Signed)
pt called states she going to try and get worked in to see her primary care doctor today the nausea is better but she still has but not as bad

## 2017-05-10 ENCOUNTER — Other Ambulatory Visit: Payer: Self-pay | Admitting: Psychiatry

## 2017-05-11 ENCOUNTER — Telehealth: Payer: Self-pay

## 2017-05-11 LAB — CARBAMAZEPINE LEVEL, TOTAL: CARBAMAZEPINE LVL: 9.9 ug/mL (ref 4.0–12.0)

## 2017-05-11 NOTE — Telephone Encounter (Signed)
Yes , if she is not having any side effects at this time or is not dizzy or having falls, she can wait until next week and I can see her Monday or Tuesday next week instead of 3/15, which is her next scheduled appointment. But we may need repeat labs anyway since her recent labs are high and since she took some extra pills last week , not sure if these levels are right. we may need to repeat her levels or may need to reduce the dose .

## 2017-05-11 NOTE — Telephone Encounter (Signed)
spoke with pt she states she has an appt for pcp today at 2:45  pt states she has alot going on this week and she states she hope you dont need the labwork redone because it is expenseive. pt stats she will call us back when she gets back from her appt.

## 2017-05-12 ENCOUNTER — Ambulatory Visit (INDEPENDENT_AMBULATORY_CARE_PROVIDER_SITE_OTHER): Payer: Medicare Other | Admitting: Psychiatry

## 2017-05-12 ENCOUNTER — Other Ambulatory Visit: Payer: Self-pay

## 2017-05-12 ENCOUNTER — Encounter: Payer: Self-pay | Admitting: Psychiatry

## 2017-05-12 VITALS — BP 149/79 | HR 74 | Temp 97.4°F

## 2017-05-12 DIAGNOSIS — F3110 Bipolar disorder, current episode manic without psychotic features, unspecified: Secondary | ICD-10-CM | POA: Diagnosis not present

## 2017-05-12 NOTE — Patient Instructions (Signed)
PLEASE GO TO YOUR LAB TO GET DEPAKOTE AND TEGRETOL(CARBAMAZEPINE) LEVEL IN THE AM PRIOR TO AM DOSE OF TEGRETOL AND DEPAKOTE .

## 2017-05-12 NOTE — Progress Notes (Signed)
[facility] MD OP Progress Note  05/12/2017 4:44 PM Stephanie [name]  MRN:  [id number]  Chief Complaint: ' I am still manic" Chief Complaint    Follow-up; Medication Problem; Other; Results     HPI: Stephanie [name] 72 year old Caucasian female, divorced, lives in [place], presented to the clinic today for a follow-up visit.  Patient presented with her friend Lelon Frohlich.  Patient was asked to come in for a follow-up appointment since her Tegretol level was noted as 9.9 on 05/11/2017.  Patient had called writer few days ago stating that she was taking a higher dose of Tegretol than what was ordered since she did not follow the directions or did not understand.  Patient is taking Tegretol in combination with Depakote also.  Patient today reports she does not have any side effects like dizziness, confusion, falls and so on which she used to before.  Patient however reports she continues to feel she is manic.  She reports she woke up last night at around 3:00 had to use the restroom and then could not fall back asleep because she had a lot of racing thoughts.  Patient's friend provided collateral information.  According to her friend, patient may have been anxious about the meals on the wheel interview today as well as Probation officer calling her about her Tegretol level.  Patient is currently on trazodone which used to help her with sleep.  Patient and Probation officer discussed that we can continue the same dose for now and try to get a repeat level of her Depakote and Tegretol to make further changes.  Patient denies any other concerns at this time.  Visit Diagnosis:    ICD-10-CM   1. Bipolar I disorder, most recent episode (or current) manic ([place]) F31.10     Past Psychiatric History: Bipolar disorder since the past several years.  Past Medical History:  Past Medical History:  Diagnosis Date  . Anginal pain ([place])   . Anxiety   . Bipolar 1 disorder ([place])   . Bipolar disorder ([place])   . Coronary artery disease   . Depression   . Fatigue    . Fibromyalgia   . GERD (gastroesophageal reflux disease)   . Heart disease   . Heart failure ([place])   . HLD (hyperlipidemia)   . HOH (hard of hearing)   . Hypothyroidism   . IBS (irritable bowel syndrome)   . Pelvic pain in female   . Perianal lesion    high grade sil lesion- keratinizing type  . RA (rheumatoid arthritis) ([place])   . Raynaud phenomenon   . Sjogren's disease ([place])   . Sleep apnea   . Vaginal Pap smear, abnormal     Past Surgical History:  Procedure Laterality Date  . CATARACT EXTRACTION W/PHACO Left 02/23/2017   Procedure: CATARACT EXTRACTION PHACO AND INTRAOCULAR LENS PLACEMENT (IOC);  Surgeon: Birder Robson, MD;  Location: [facility] ORS;  Service: Ophthalmology;  Laterality: Left;  [place] 00:35 AP% 9.6 CDE 3.47 Fluid pack lot # [id number]  . CATARACT EXTRACTION W/PHACO Right 03/30/2017   Procedure: CATARACT EXTRACTION PHACO AND INTRAOCULAR LENS PLACEMENT ([place]);  Surgeon: Birder Robson, MD;  Location: [facility] ORS;  Service: Ophthalmology;  Laterality: Right;  [place] 00:32.6 AP% 13.8 CDE 4.51 Fluid Pack Lot # [id number]  . EYE SURGERY    . TONSILLECTOMY      Family Psychiatric History: Mother-bipolar disorder, sister-depression  Family History:  Family History  Problem Relation Age of Onset  . Heart failure Father   . Parkinson's disease Mother   .  Bipolar disorder Mother   . Depression Sister   . Schizophrenia Paternal Grandmother   . Diabetes Maternal Grandmother   . Pancreatic cancer Maternal Grandmother   . Cancer Neg Hx   . Heart disease Neg Hx    Substance abuse history: Denies  Social History: Divorced, has 2 adult children, a son who lives in [place] and a daughter who lives in [place].  She lives alone in [place].  Has supportive friends. Social History   Socioeconomic History  . Marital status: Divorced    Spouse name: None  . Number of children: 2  . Years of education: None  . Highest education level: Master's degree (e.g., MA, MS, MEng, MEd,  MSW, MBA)  Social Needs  . Financial resource strain: Somewhat hard  . Food insecurity - worry: Never true  . Food insecurity - inability: Never true  . Transportation needs - medical: No  . Transportation needs - non-medical: No  Occupational History    Comment: retired2  Tobacco Use  . Smoking status: Never Smoker  . Smokeless tobacco: Former Network engineer and Sexual Activity  . Alcohol use: No    Alcohol/week: 0.0 oz  . Drug use: No  . Sexual activity: No    Birth control/protection: Post-menopausal  Other Topics Concern  . None  Social History Narrative  . None    Allergies:  Allergies  Allergen Reactions  . Amoxicillin-Pot Clavulanate Nausea Only and Other (See Comments)    Diarrhea Has patient had a PCN reaction causing immediate rash, facial/tongue/throat swelling, SOB or lightheadedness with hypotension:  Has patient had a PCN reaction causing severe rash involving mucus membranes or skin necrosis: Has patient had a PCN reaction that required hospitalization:  Has patient had a PCN reaction occurring within the last 10 years: If all of the above answers are "NO", then may proceed with Cephalosporin use.   Marland Kitchen Oxycodone-Acetaminophen Nausea Only  . Erythromycin Nausea Only  . Azithromycin Nausea And Vomiting  . Codeine   . Macrolides And Ketolides   . Oxycodone-Acetaminophen   . Penicillins Other (See Comments)    Has patient had a PCN reaction causing immediate rash, facial/tongue/throat swelling, SOB or lightheadedness with hypotension: NO Has patient had a PCN reaction causing severe rash involving mucus membranes or skin necrosis: NO Has patient had a PCN reaction that required hospitalization: NO Has patient had a PCN reaction occurring within the last 10 years: NO If all of the above answers are "NO", then may proceed with Cephalosporin use.   . Tramadol Nausea And Vomiting  . Lamotrigine Rash    LAMICTAL  . Sulfa Antibiotics Rash    Metabolic  Disorder Labs: No results found for: HGBA1C, MPG No results found for: PROLACTIN No results found for: CHOL, TRIG, HDL, CHOLHDL, VLDL, LDLCALC No results found for: TSH  Therapeutic Level Labs: No results found for: LITHIUM Lab Results  Component Value Date   VALPROATE 58 02/22/2017   No components found for:  CBMZ  Current Medications: Current Outpatient Medications  Medication Sig Dispense Refill  . Abatacept (ORENCIA IV) Inject 1 Dose into the vein every 30 (thirty) days.    Marland Kitchen aspirin 81 MG chewable tablet Chew 81 mg by mouth daily with breakfast.    . Calcium Citrate-Vitamin D ([employer] CALCIUM CITRATE +D3 MINI) 200-250 MG-UNIT TABS Take 1 tablet by mouth daily after breakfast.     . carbamazepine (TEGRETOL-XR) 200 MG 12 hr tablet Take 2 tablets (400 mg total) by mouth  2 (two) times daily. 120 tablet 1  . divalproex (DEPAKOTE ER) 500 MG 24 hr tablet Take 1 tablet (500 mg total) by mouth 2 (two) times daily. Take 1 tablet in the morning and 1 tablet at nighttime (Patient taking differently: Take 500 mg by mouth 2 (two) times daily. BREAKFAST & BEDTIME) 397 tablet 1  . folic acid (FOLVITE) 1 MG tablet Take 2 mg by mouth daily with breakfast.     . gabapentin (NEURONTIN) 300 MG capsule Take 1 capsule (300 mg total) by mouth 2 (two) times daily. (Patient taking differently: Take 300 mg by mouth 2 (two) times daily. LUNCH & BEDTIME.) 180 capsule 1  . levothyroxine (SYNTHROID, LEVOTHROID) 75 MCG tablet TAKE 1 TABLET BY MOUTH EACH DAY ON EMPTYSTOMACH WITH A GLASS OF WATERAT LEAST 30 TO 60 MINUTES BEFORE BREAKFAST IN THE MORNING    . methotrexate 2.5 MG tablet Take 15 mg by mouth every Wednesday. WEDNESDAYS AT LUNCH    . moxifloxacin (VIGAMOX) 0.5 % ophthalmic solution Place 1 drop into the right eye 2 (two) times daily.    . Multiple Vitamins-Minerals (MULTIVITAMIN WITH MINERALS) tablet Take 1 tablet by mouth daily with breakfast.     . nitroGLYCERIN (NITROSTAT) 0.4 MG SL tablet Place 0.4 mg  under the tongue every 5 (five) minutes as needed for chest pain.     Marland Kitchen ondansetron (ZOFRAN) 8 MG tablet TAKE 1 TABLET BY MOUTH EVERY 12 HOURS ASNEEDED FOR NAUSEA    . Polyethyl Glycol-Propyl Glycol (SYSTANE) 0.4-0.3 % GEL ophthalmic gel Place 1 application into both eyes 3 (three) times daily as needed (typically twice daily). Equate Brand     . pravastatin (PRAVACHOL) 10 MG tablet Take 10 mg by mouth at bedtime.     . ranitidine (ZANTAC) 150 MG tablet Take 150 mg by mouth 2 (two) times daily. BREAKFAST & BEDTIME.    . traZODone (DESYREL) 50 MG tablet Take 1-1.5 tablets (50-75 mg total) by mouth at bedtime as needed for sleep. 135 tablet 1  . Zoledronic Acid (RECLAST IV) Inject 1 Dose into the vein.    . cetirizine (ZYRTEC) 10 MG tablet Take by mouth.     No current facility-administered medications for this visit.      Musculoskeletal: Strength & Muscle Tone: within normal limits Gait & Station: normal Patient leans: N/A  Psychiatric Specialty Exam: Review of Systems  Psychiatric/Behavioral: The patient is nervous/anxious and has insomnia (restless on and off).   All other systems reviewed and are negative.   Blood pressure (!) 149/79, pulse 74, temperature (!) 97.4 F (36.3 C), temperature source Oral.There is no height or weight on file to calculate BMI.  General Appearance: Casual  Eye Contact:  Fair  Speech:  Clear and Coherent  Volume:  Normal  Mood:  Anxious  Affect:  Congruent  Thought Process:  Goal Directed and Descriptions of Associations: Intact  Orientation:  Full (Time, Place, and Person)  Thought Content: Logical   Suicidal Thoughts:  No  Homicidal Thoughts:  No  Memory:  Immediate;   Fair Recent;   Fair Remote;   Fair  Judgement:  Fair  Insight:  Fair  Psychomotor Activity:  Normal  Concentration:  Concentration: Fair and Attention Span: Fair  Recall:  [employer] of Knowledge: Fair  Language: Fair  Akathisia:  No  Handed:  Right  AIMS (if indicated):  NA  Assets:  Communication Skills Desire for Improvement Social Support  ADL's:  Intact  Cognition: WNL  Sleep:  restless   Screenings:   Assessment and Plan: Stephanie [name] 3 year old Caucasian female who is divorced, lives in [place], has a history of bipolar disorder, multiple medical problems, status post cataract surgery, presented to the clinic today with her friend for a follow-up visit.  Patient had recent Tegretol dosage change as well as level taken.  Patient had Tegretol level resulted as slightly higher than normal range when taken in conjunction with other antiepileptic drugs. Pt also had taken more medication/Tegretol than what was prescribed since she did not follow the instructions or was confused about it.  She is also on Depakote at this time.  She denies any significant problems like falls, confusion, drowsiness and so on.  However discussed with patient that we will get another level done.  Plan as noted below.  Plan  For Bipolar disorder Continue the Tegretol as prescribed for now. Will get Tegretol level tomorrow a.m.  Further dosage changes will be made after that. Continue Depakote as prescribed for now.  No changes made.  Insomnia Continue trazodone 50-75 mg p.o. nightly as needed. Patient was worried about an interview which was scheduled for today as well as her abnormal labs which could have also caused her restless sleep last night.  Patient otherwise reports her sleep as fair.  Will not make further medication changes.  We will continue to monitor closely.   Follow-up in clinic in 1 month or sooner if needed.  More than 50 % of the time was spent for psychoeducation and supportive psychotherapy and care coordination.  This note was generated in part or whole with voice recognition software. Voice recognition is usually quite accurate but there are transcription errors that can and very often do occur. I apologize for any typographical errors that were not detected  and corrected.     Ursula Alert, MD 05/13/2017, 12:38 PM

## 2017-05-13 ENCOUNTER — Other Ambulatory Visit: Payer: Self-pay | Admitting: Psychiatry

## 2017-05-13 ENCOUNTER — Telehealth: Payer: Self-pay

## 2017-05-13 ENCOUNTER — Encounter: Payer: Self-pay | Admitting: Psychiatry

## 2017-05-13 NOTE — Telephone Encounter (Signed)
Pt was seen on 05-12-17

## 2017-05-13 NOTE — Telephone Encounter (Signed)
spoke with patient .  she states that she went at 7:10am to have her labwork done. pt states she woke up at 3:00.  pt states that her daughter might call and want to speak with Dr. Shea Evans.  pt was advised that next time she comes in that we need her to sign a release and put both her daughter and her son down as emergency contacts.  And also might want to sign a release for her nurse friend.    I told pt that if her labwork was not in our faxes in the morning I would call and see if they are complete and have them faxed before we leave on Friday.  Pt gave me the phone number of where she went to have her labs done.  [phone].  And pt did give a verbal ok that if her daughter did call we can speak with her.

## 2017-05-14 ENCOUNTER — Telehealth: Payer: Self-pay | Admitting: Psychiatry

## 2017-05-14 LAB — CARBAMAZEPINE LEVEL, TOTAL: CARBAMAZEPINE LVL: 8.3 ug/mL (ref 4.0–12.0)

## 2017-05-14 LAB — SPECIMEN STATUS REPORT

## 2017-05-14 NOTE — Telephone Encounter (Signed)
Called patient to discuss her Depakote level which is therapeutic.  Discussed with patient that her Tegretol level is still pending.  Discussed with her that we will reach out to her once we get the levels back.

## 2017-05-14 NOTE — Telephone Encounter (Signed)
Received [facility] report this morning stating Trileptal level pending. Reached out to customer care-spoke to [facility] in customer service-discussed that we had asked for a Tegretol level and not a Trileptal level.  Correction made and they said tegretol level will be run soon and we will send the results to [place].

## 2017-05-14 NOTE — Telephone Encounter (Signed)
Called [facility] they will send what is in they said that there is one test that is pending. And will not be done on Monday.

## 2017-05-15 LAB — 10-HYDROXYCARBAZEPINE: OXCARBAZEPINE SERPL-MCNC: NOT DETECTED ug/mL (ref 10–35)

## 2017-05-15 LAB — VALPROIC ACID LEVEL: VALPROIC ACID LVL: 59 ug/mL (ref 50–100)

## 2017-05-17 ENCOUNTER — Telehealth: Payer: Self-pay

## 2017-05-17 ENCOUNTER — Telehealth: Payer: Self-pay | Admitting: Psychiatry

## 2017-05-17 DIAGNOSIS — F3110 Bipolar disorder, current episode manic without psychotic features, unspecified: Secondary | ICD-10-CM

## 2017-05-17 MED ORDER — HYDROXYZINE HCL 10 MG PO TABS: 10.0000 mg | ORAL_TABLET | Freq: Every day | ORAL | 0 refills | Status: DC

## 2017-05-17 NOTE — Telephone Encounter (Signed)
pt called left message that she was waiting on labwork results.

## 2017-05-17 NOTE — Telephone Encounter (Signed)
Called patient to discuss her sleep problems.  Patient reports that the trazodone high dose made her nauseous.  Discussed adding a medication like hydroxyzine 10 mg as needed for restlessness at night.  She agrees with plan.  She will see me on 05/21/2017.  If she continues to have sleep issues she may need a new sleep aid at that time.  Patient wants [place] to call her pharmacy to make sure they deliver this medication tonight.  We will send the message to [facility] CMA to do the same.

## 2017-05-17 NOTE — Telephone Encounter (Signed)
Please call her pharmacy and ask them to deliver her vistaril prescribed today to her home today. ( as per Pt request)

## 2017-05-17 NOTE — Telephone Encounter (Signed)
Pt was called and told the labwork result.  Pt states that she still not sleeping and wants to know is she should go up on the trazodone. Pt states she doesn't feel like the trazodone works pt wants a call back after 3:00 she going to try and rest.

## 2017-05-17 NOTE — Telephone Encounter (Signed)
Pharmacy was called they just wanted to make sure no other changes and pt to continue with same medication .Stephanie [name]

## 2017-05-17 NOTE — Telephone Encounter (Signed)
Stephanie [name], could you please call her and let her know her tegretol level came back normal - 8.3 - which is lower than what it was last time. She can continue the same dosage of medications.

## 2017-05-21 ENCOUNTER — Other Ambulatory Visit: Payer: Self-pay

## 2017-05-21 ENCOUNTER — Encounter: Payer: Self-pay | Admitting: Psychiatry

## 2017-05-21 ENCOUNTER — Ambulatory Visit: Payer: Medicare Other | Admitting: Psychiatry

## 2017-05-21 VITALS — BP 132/86 | HR 86 | Temp 98.7°F | Wt 109.6 lb

## 2017-05-21 DIAGNOSIS — F3110 Bipolar disorder, current episode manic without psychotic features, unspecified: Secondary | ICD-10-CM

## 2017-05-21 NOTE — Patient Instructions (Signed)
Please take the Trazodone 75 mg at bedtime for sleep every night. Please give it at least 30 minutes.  That means if you take the trazodone at 8:30 PM, wait until 9 PM.  If you do not fall asleep by 9 PM , then you can go ahead and take hydroxyzine 10 mg.  If you wake up in the middle of the night and cannot fall back asleep you can take another 10 mg.  Please do not take more than 20 mg.

## 2017-05-21 NOTE — Progress Notes (Signed)
[facility] MD OP Progress Note  05/21/2017 12:57 PM Stephanie [name]  MRN:  [id number]  Chief Complaint: ' I am sleeping better." Chief Complaint    Follow-up; Medication Refill     HPI: Stephanie [name] is a 72 y old CF, divorced, lives in [place], presented to the clinic today for a follow-up visit.  Patient presented today with her friend and.  Patient has a history of bipolar disorder as well as medical problems like coronary artery disease, GERD,Sjogren's syndrome, rheumatoid arthritis.  Patient today presented for a scheduled visit with Probation officer.  Patient had some recent problems having manic symptoms as well as sleep problems.  Patient's Tegretol dosage was increased recently.  She had accidentally taken a higher dose of Tegretol by mistake since she did not follow the instructions.  Patient had two Tegretol levels done after that and her most recent Tegretol level is within normal limit and therapeutic.  Patient reports she is doing better with regards to her manic symptoms.  She is not as anxious as she used to be before.  She however was having some sleep problems.  She was prescribed hydroxyzine few days ago and was asked to take 10 mg to 20 mg as needed.  She reports she tried taking the 10 mg along with the trazodone and then took another 10 mg when she woke up in the middle of the night.  She reports that combination has been helping her to rest.  Pt reports she currently gets meals on the wheels and is very happy with their services.  Patient otherwise denies any other concerns today.  Patient's friend and also provided collateral information.  Visit Diagnosis:    ICD-10-CM   1. Bipolar I disorder, most recent episode (or current) manic ([place]) F31.10     Past Psychiatric History: Bipolar disorder since the past several years.  Past Medical History:  Past Medical History:  Diagnosis Date  . Anginal pain ([place])   . Anxiety   . Bipolar 1 disorder ([place])   . Bipolar disorder ([place])   . Coronary artery  disease   . Depression   . Fatigue   . Fibromyalgia   . GERD (gastroesophageal reflux disease)   . Heart disease   . Heart failure ([place])   . HLD (hyperlipidemia)   . HOH (hard of hearing)   . Hypothyroidism   . IBS (irritable bowel syndrome)   . Pelvic pain in female   . Perianal lesion    high grade sil lesion- keratinizing type  . RA (rheumatoid arthritis) ([place])   . Raynaud phenomenon   . Sjogren's disease ([place])   . Sleep apnea   . Vaginal Pap smear, abnormal     Past Surgical History:  Procedure Laterality Date  . CATARACT EXTRACTION W/PHACO Left 02/23/2017   Procedure: CATARACT EXTRACTION PHACO AND INTRAOCULAR LENS PLACEMENT (IOC);  Surgeon: Birder Robson, MD;  Location: [facility] ORS;  Service: Ophthalmology;  Laterality: Left;  [place] 00:35 AP% 9.6 CDE 3.47 Fluid pack lot # [id number]  . CATARACT EXTRACTION W/PHACO Right 03/30/2017   Procedure: CATARACT EXTRACTION PHACO AND INTRAOCULAR LENS PLACEMENT ([place]);  Surgeon: Birder Robson, MD;  Location: [facility] ORS;  Service: Ophthalmology;  Laterality: Right;  [place] 00:32.6 AP% 13.8 CDE 4.51 Fluid Pack Lot # [id number]  . EYE SURGERY    . TONSILLECTOMY      Family Psychiatric History: Mother-bipolar disorder, sister-depression.  Family History:  Family History  Problem Relation Age of Onset  . Heart failure Father   .  Parkinson's disease Mother   . Bipolar disorder Mother   . Depression Sister   . Schizophrenia Paternal Grandmother   . Diabetes Maternal Grandmother   . Pancreatic cancer Maternal Grandmother   . Cancer Neg Hx   . Heart disease Neg Hx    Substance abuse history: Denies  Social History: Divorced, has 2 adult children, a son who lives in [place] and a daughter who lives in [place].  She lives alone in [place].  She has supportive friends.  She presented today with her friend ,Lelon Frohlich, who is very supportive. Social History   Socioeconomic History  . Marital status: Divorced    Spouse name: None  . Number of  children: 2  . Years of education: None  . Highest education level: Master's degree (e.g., MA, MS, MEng, MEd, MSW, MBA)  Social Needs  . Financial resource strain: Somewhat hard  . Food insecurity - worry: Never true  . Food insecurity - inability: Never true  . Transportation needs - medical: No  . Transportation needs - non-medical: No  Occupational History    Comment: retired2  Tobacco Use  . Smoking status: Never Smoker  . Smokeless tobacco: Former Network engineer and Sexual Activity  . Alcohol use: No    Alcohol/week: 0.0 oz  . Drug use: No  . Sexual activity: No    Birth control/protection: Post-menopausal  Other Topics Concern  . None  Social History Narrative  . None    Allergies:  Allergies  Allergen Reactions  . Amoxicillin-Pot Clavulanate Nausea Only and Other (See Comments)    Diarrhea Has patient had a PCN reaction causing immediate rash, facial/tongue/throat swelling, SOB or lightheadedness with hypotension:  Has patient had a PCN reaction causing severe rash involving mucus membranes or skin necrosis: Has patient had a PCN reaction that required hospitalization:  Has patient had a PCN reaction occurring within the last 10 years: If all of the above answers are "NO", then may proceed with Cephalosporin use.   Marland Kitchen Oxycodone-Acetaminophen Nausea Only  . Erythromycin Nausea Only  . Azithromycin Nausea And Vomiting  . Codeine   . Macrolides And Ketolides   . Oxycodone-Acetaminophen   . Penicillins Other (See Comments)    Has patient had a PCN reaction causing immediate rash, facial/tongue/throat swelling, SOB or lightheadedness with hypotension: NO Has patient had a PCN reaction causing severe rash involving mucus membranes or skin necrosis: NO Has patient had a PCN reaction that required hospitalization: NO Has patient had a PCN reaction occurring within the last 10 years: NO If all of the above answers are "NO", then may proceed with Cephalosporin use.   .  Tramadol Nausea And Vomiting  . Lamotrigine Rash    LAMICTAL  . Sulfa Antibiotics Rash    Metabolic Disorder Labs: No results found for: HGBA1C, MPG No results found for: PROLACTIN No results found for: CHOL, TRIG, HDL, CHOLHDL, VLDL, LDLCALC No results found for: TSH  Therapeutic Level Labs: No results found for: LITHIUM Lab Results  Component Value Date   VALPROATE 59 05/13/2017   VALPROATE 58 02/22/2017   No components found for:  CBMZ  Current Medications: Current Outpatient Medications  Medication Sig Dispense Refill  . Abatacept (ORENCIA IV) Inject 1 Dose into the vein every 30 (thirty) days.    Marland Kitchen aspirin 81 MG chewable tablet Chew 81 mg by mouth daily with breakfast.    . Calcium Citrate-Vitamin D (CVS CALCIUM CITRATE +D3 MINI) 200-250 MG-UNIT TABS Take 1 tablet by  mouth daily after breakfast.     . carbamazepine (TEGRETOL-XR) 200 MG 12 hr tablet Take 2 tablets (400 mg total) by mouth 2 (two) times daily. 120 tablet 1  . cetirizine (ZYRTEC) 10 MG tablet Take by mouth.    . divalproex (DEPAKOTE ER) 500 MG 24 hr tablet Take 1 tablet (500 mg total) by mouth 2 (two) times daily. Take 1 tablet in the morning and 1 tablet at nighttime (Patient taking differently: Take 500 mg by mouth 2 (two) times daily. BREAKFAST & BEDTIME) 725 tablet 1  . folic acid (FOLVITE) 1 MG tablet Take 2 mg by mouth daily with breakfast.     . gabapentin (NEURONTIN) 300 MG capsule Take 1 capsule (300 mg total) by mouth 2 (two) times daily. (Patient taking differently: Take 300 mg by mouth 2 (two) times daily. LUNCH & BEDTIME.) 180 capsule 1  . hydrOXYzine (ATARAX/VISTARIL) 10 MG tablet Take 1-2 tablets (10-20 mg total) by mouth at bedtime. For sleep issues 60 tablet 0  . levothyroxine (SYNTHROID, LEVOTHROID) 75 MCG tablet TAKE 1 TABLET BY MOUTH EACH DAY ON EMPTYSTOMACH WITH A GLASS OF WATERAT LEAST 30 TO 60 MINUTES BEFORE BREAKFAST IN THE MORNING    . methotrexate 2.5 MG tablet Take 15 mg by mouth every  Wednesday. WEDNESDAYS AT LUNCH    . moxifloxacin (VIGAMOX) 0.5 % ophthalmic solution Place 1 drop into the right eye 2 (two) times daily.    . Multiple Vitamins-Minerals (MULTIVITAMIN WITH MINERALS) tablet Take 1 tablet by mouth daily with breakfast.     . nitroGLYCERIN (NITROSTAT) 0.4 MG SL tablet Place 0.4 mg under the tongue every 5 (five) minutes as needed for chest pain.     Marland Kitchen ondansetron (ZOFRAN) 8 MG tablet TAKE 1 TABLET BY MOUTH EVERY 12 HOURS ASNEEDED FOR NAUSEA    . Polyethyl Glycol-Propyl Glycol (SYSTANE) 0.4-0.3 % GEL ophthalmic gel Place 1 application into both eyes 3 (three) times daily as needed (typically twice daily). Equate Brand     . pravastatin (PRAVACHOL) 10 MG tablet Take 10 mg by mouth at bedtime.     . ranitidine (ZANTAC) 150 MG tablet Take 150 mg by mouth 2 (two) times daily. BREAKFAST & BEDTIME.    . traZODone (DESYREL) 50 MG tablet Take 1-1.5 tablets (50-75 mg total) by mouth at bedtime as needed for sleep. 135 tablet 1  . Zoledronic Acid (RECLAST IV) Inject 1 Dose into the vein.     No current facility-administered medications for this visit.      Musculoskeletal: Strength & Muscle Tone: within normal limits Gait & Station: normal Patient leans: N/A  Psychiatric Specialty Exam: Review of Systems  Psychiatric/Behavioral: The patient is nervous/anxious (on and off) and has insomnia (improving).   All other systems reviewed and are negative.   Blood pressure 132/86, pulse 86, temperature 98.7 F (37.1 C), temperature source Oral, weight 109 lb 9.6 oz (49.7 kg).Body mass index is 21.4 kg/m.  General Appearance: Casual  Eye Contact:  Fair  Speech:  Pressured  Volume:  Normal  Mood:  Anxious  Affect:  Congruent  Thought Process:  Goal Directed and Descriptions of Associations: Intact  Orientation:  Full (Time, Place, and Person)  Thought Content: Logical   Suicidal Thoughts:  No  Homicidal Thoughts:  No  Memory:  Immediate;   Fair Recent;   Fair Remote;    Fair  Judgement:  Fair  Insight:  Fair  Psychomotor Activity:  Normal  Concentration:  Concentration: Fair and Attention Span:  Fair  Recall:  Smiley Houseman of Knowledge: Fair  Language: Fair  Akathisia:  No  Handed:  Right  AIMS (if indicated): na  Assets:  Communication Skills Desire for Improvement Financial Resources/Insurance Housing Social Support  ADL's:  Intact  Cognition: WNL  Sleep:  improved   Screenings:   Assessment and Plan: Cassidy is a 72 year old Caucasian female who is divorced, lives in [place], has a history of bipolar disorder, multiple medical problems, presented to the clinic today for a follow-up visit.  Patient had recent Tegretol dosage change as well as levels taken.  Her levels came back therapeutic and within normal limits when tested for the second time.  She is currently improving with regards to her sleep issues.  Plan as noted below.  Plan For bipolar disorder Continue Tegretol XR 400 mg twice a day. Tegretol level- 8.3 mcg/mL-on 05/13/2017-therapeutic. Continue Depakote ER 500 mg twice a day. Depakote level- 05/13/2017-59 mcg/mL-therapeutic.  Insomnia Continue trazodone 75 mg p.o. nightly Continue hydroxyzine 10-20 mg p.o. nightly as needed.  Patient provided with written instructions how to take the medication.  Provided supportive psychotherapy for 10 minutes.  Provided medication education.  Discussed side effects profile.  Follow-up in clinic in 1 month or sooner if needed.  More than 50 % of the time was spent for psychoeducation and supportive psychotherapy and care coordination.  This note was generated in part or whole with voice recognition software. Voice recognition is usually quite accurate but there are transcription errors that can and very often do occur. I apologize for any typographical errors that were not detected and corrected.        Ursula Alert, MD 05/21/2017, 12:57 PM

## 2017-06-09 ENCOUNTER — Ambulatory Visit: Payer: Self-pay | Admitting: Psychiatry

## 2017-06-16 ENCOUNTER — Other Ambulatory Visit: Payer: Self-pay | Admitting: Unknown Physician Specialty

## 2017-06-16 ENCOUNTER — Ambulatory Visit
Admission: RE | Admit: 2017-06-16 | Discharge: 2017-06-16 | Disposition: A | Discharge status: Home / Self Care | Payer: Medicare Other | Source: Ambulatory Visit | Attending: Unknown Physician Specialty | Admitting: Unknown Physician Specialty

## 2017-06-16 DIAGNOSIS — R062 Wheezing: Secondary | ICD-10-CM

## 2017-06-16 DIAGNOSIS — R059 Cough, unspecified: Secondary | ICD-10-CM

## 2017-06-16 DIAGNOSIS — R05 Cough: Secondary | ICD-10-CM | POA: Insufficient documentation

## 2017-06-17 ENCOUNTER — Ambulatory Visit: Payer: Medicare Other | Admitting: Psychiatry

## 2017-06-17 ENCOUNTER — Other Ambulatory Visit: Payer: Self-pay

## 2017-06-17 ENCOUNTER — Encounter: Payer: Self-pay | Admitting: Psychiatry

## 2017-06-17 VITALS — BP 134/97 | HR 76 | Temp 97.5°F | Wt 110.4 lb

## 2017-06-17 DIAGNOSIS — F3110 Bipolar disorder, current episode manic without psychotic features, unspecified: Secondary | ICD-10-CM | POA: Diagnosis not present

## 2017-06-17 DIAGNOSIS — F5105 Insomnia due to other mental disorder: Secondary | ICD-10-CM | POA: Diagnosis not present

## 2017-06-17 MED ORDER — CARBAMAZEPINE ER 200 MG PO TB12: 400.0000 mg | ORAL_TABLET | Freq: Two times a day (BID) | ORAL | 1 refills | Status: DC

## 2017-06-17 NOTE — Progress Notes (Signed)
[place] MD OP Progress Note  06/17/2017 8:49 PM Stephanie [name]  MRN:  [id number]  Chief Complaint: ' I am doing well.' Chief Complaint    Follow-up; Medication Refill     HPI: Stephanie [name] is a 72 year old Caucasian female, divorced, lives in [place], presented to the clinic today for a follow-up visit.  Patient presented today with her friend Lelon Frohlich. Patient has a history of bipolar disorder as well as medical problems like coronary artery disease, GERD, Sjogren's syndrome, rheumatoid arthritis.   Pt today reports she is currently doing better with her mood symptoms.  She reports her most recent manic episode may have resolved since she feels more stable now.  Patient is currently on Tegretol, Depakote, gabapentin.  Patient remains compliant with her medications.  Patient denies any side effects.  Patient reports she continues to take trazodone 75 mg for sleep.  She reports there are days when she feels she needs an extra sleep aid and she takes the hydroxyzine as needed.  She reports the 10 mg helps her to rest.  She has not been taking it very frequently.  Patient continues to be very grateful for the Meals on Wheels program.  Patient continues to exercise and stay active.  Patient reports she has several appointments scheduled with her other providers.  She had a hearing test done and may need hearing aids.  She reports she is working on the same.  She also was referred to her pulmonologist since her ENT specialist was concerned about her lung function .  She reports she has an upcoming appointment for the same.  She denies any other concerns today  Visit Diagnosis:    ICD-10-CM   1. Bipolar I disorder, most recent episode (or current) manic ([place]) F31.10 carbamazepine (TEGRETOL-XR) 200 MG 12 hr tablet   improving  2. Insomnia due to mental disorder F51.05     Past Psychiatric History:Bipolar disorder since the past several years.  Past Medical History:  Past Medical History:  Diagnosis Date   . Anginal pain ([place])   . Anxiety   . Bipolar 1 disorder ([place])   . Bipolar disorder ([place])   . Coronary artery disease   . Depression   . Fatigue   . Fibromyalgia   . GERD (gastroesophageal reflux disease)   . Heart disease   . Heart failure ([place])   . HLD (hyperlipidemia)   . HOH (hard of hearing)   . Hypothyroidism   . IBS (irritable bowel syndrome)   . Pelvic pain in female   . Perianal lesion    high grade sil lesion- keratinizing type  . RA (rheumatoid arthritis) ([place])   . Raynaud phenomenon   . Sjogren's disease ([place])   . Sleep apnea   . Vaginal Pap smear, abnormal     Past Surgical History:  Procedure Laterality Date  . CATARACT EXTRACTION W/PHACO Left 02/23/2017   Procedure: CATARACT EXTRACTION PHACO AND INTRAOCULAR LENS PLACEMENT (IOC);  Surgeon: Birder Robson, MD;  Location: [facility] ORS;  Service: Ophthalmology;  Laterality: Left;  [place] 00:35 AP% 9.6 CDE 3.47 Fluid pack lot # [id number]  . CATARACT EXTRACTION W/PHACO Right 03/30/2017   Procedure: CATARACT EXTRACTION PHACO AND INTRAOCULAR LENS PLACEMENT ([place]);  Surgeon: Birder Robson, MD;  Location: [facility] ORS;  Service: Ophthalmology;  Laterality: Right;  [place] 00:32.6 AP% 13.8 CDE 4.51 Fluid Pack Lot # [id number]  . EYE SURGERY    . TONSILLECTOMY      Family Psychiatric History:Mother -Bipolar disorder, sister-depression  Family History:  Family History  Problem Relation Age of Onset  . Heart failure Father   . Parkinson's disease Mother   . Bipolar disorder Mother   . Depression Sister   . Schizophrenia Paternal Grandmother   . Diabetes Maternal Grandmother   . Pancreatic cancer Maternal Grandmother   . Cancer Neg Hx   . Heart disease Neg Hx   Substance abuse history: Denies   Social History: Divorced, has 2 children, a son who lives in [place] and a daughter who lives in [place].  She lives alone in [place].  She has supportive friends.  She stays active and exercises daily.  She presented today with  her friend , Lelon Frohlich who is very supportive Social History   Socioeconomic History  . Marital status: Divorced    Spouse name: Not on file  . Number of children: 2  . Years of education: Not on file  . Highest education level: Master's degree (e.g., MA, MS, MEng, MEd, MSW, MBA)  Occupational History    Comment: retired2  Social Needs  . Financial resource strain: Somewhat hard  . Food insecurity:    Worry: Never true    Inability: Never true  . Transportation needs:    Medical: No    Non-medical: No  Tobacco Use  . Smoking status: Never Smoker  . Smokeless tobacco: Former Network engineer and Sexual Activity  . Alcohol use: No    Alcohol/week: 0.0 oz  . Drug use: No  . Sexual activity: Never    Birth control/protection: Post-menopausal  Lifestyle  . Physical activity:    Days per week: 0 days    Minutes per session: 0 min  . Stress: Only a little  Relationships  . Social connections:    Talks on phone: Not on file    Gets together: Not on file    Attends religious service: Never    Active member of club or organization: No    Attends meetings of clubs or organizations: Never    Relationship status: Divorced  Other Topics Concern  . Not on file  Social History Narrative  . Not on file    Allergies:  Allergies  Allergen Reactions  . Amoxicillin-Pot Clavulanate Nausea Only and Other (See Comments)    Diarrhea Has patient had a PCN reaction causing immediate rash, facial/tongue/throat swelling, SOB or lightheadedness with hypotension:  Has patient had a PCN reaction causing severe rash involving mucus membranes or skin necrosis: Has patient had a PCN reaction that required hospitalization:  Has patient had a PCN reaction occurring within the last 10 years: If all of the above answers are "NO", then may proceed with Cephalosporin use.   Marland Kitchen Oxycodone-Acetaminophen Nausea Only  . Erythromycin Nausea Only  . Azithromycin Nausea And Vomiting  . Codeine   . Macrolides  And Ketolides   . Oxycodone-Acetaminophen   . Penicillins Other (See Comments)    Has patient had a PCN reaction causing immediate rash, facial/tongue/throat swelling, SOB or lightheadedness with hypotension: NO Has patient had a PCN reaction causing severe rash involving mucus membranes or skin necrosis: NO Has patient had a PCN reaction that required hospitalization: NO Has patient had a PCN reaction occurring within the last 10 years: NO If all of the above answers are "NO", then may proceed with Cephalosporin use.   . Tramadol Nausea And Vomiting  . Lamotrigine Rash    LAMICTAL  . Sulfa Antibiotics Rash    Metabolic Disorder Labs: No results found for: HGBA1C, MPG  No results found for: PROLACTIN No results found for: CHOL, TRIG, HDL, CHOLHDL, VLDL, LDLCALC No results found for: TSH  Therapeutic Level Labs: No results found for: LITHIUM Lab Results  Component Value Date   VALPROATE 59 05/13/2017   VALPROATE 58 02/22/2017   No components found for:  CBMZ  Current Medications: Current Outpatient Medications  Medication Sig Dispense Refill  . Abatacept (ORENCIA IV) Inject 1 Dose into the vein every 30 (thirty) days.    Marland Kitchen aspirin 81 MG chewable tablet Chew 81 mg by mouth daily with breakfast.    . Calcium Citrate-Vitamin D (CVS CALCIUM CITRATE +D3 MINI) 200-250 MG-UNIT TABS Take 1 tablet by mouth daily after breakfast.     . carbamazepine (TEGRETOL-XR) 200 MG 12 hr tablet Take 2 tablets (400 mg total) by mouth 2 (two) times daily. 360 tablet 1  . cetirizine (ZYRTEC) 10 MG tablet Take by mouth.    . divalproex (DEPAKOTE ER) 500 MG 24 hr tablet Take 1 tablet (500 mg total) by mouth 2 (two) times daily. Take 1 tablet in the morning and 1 tablet at nighttime (Patient taking differently: Take 500 mg by mouth 2 (two) times daily. BREAKFAST & BEDTIME) 510 tablet 1  . folic acid (FOLVITE) 1 MG tablet Take 2 mg by mouth daily with breakfast.     . gabapentin (NEURONTIN) 300 MG capsule  Take 1 capsule (300 mg total) by mouth 2 (two) times daily. (Patient taking differently: Take 300 mg by mouth 2 (two) times daily. LUNCH & BEDTIME.) 180 capsule 1  . hydrOXYzine (ATARAX/VISTARIL) 10 MG tablet Take 1-2 tablets (10-20 mg total) by mouth at bedtime. For sleep issues 60 tablet 0  . levothyroxine (SYNTHROID, LEVOTHROID) 75 MCG tablet TAKE 1 TABLET BY MOUTH EACH DAY ON EMPTYSTOMACH WITH A GLASS OF WATERAT LEAST 30 TO 60 MINUTES BEFORE BREAKFAST IN THE MORNING    . methotrexate 2.5 MG tablet Take 15 mg by mouth every Wednesday. WEDNESDAYS AT LUNCH    . moxifloxacin (VIGAMOX) 0.5 % ophthalmic solution Place 1 drop into the right eye 2 (two) times daily.    . Multiple Vitamins-Minerals (MULTIVITAMIN WITH MINERALS) tablet Take 1 tablet by mouth daily with breakfast.     . nitroGLYCERIN (NITROSTAT) 0.4 MG SL tablet Place 0.4 mg under the tongue every 5 (five) minutes as needed for chest pain.     Marland Kitchen ondansetron (ZOFRAN) 8 MG tablet TAKE 1 TABLET BY MOUTH EVERY 12 HOURS ASNEEDED FOR NAUSEA    . Polyethyl Glycol-Propyl Glycol (SYSTANE) 0.4-0.3 % GEL ophthalmic gel Place 1 application into both eyes 3 (three) times daily as needed (typically twice daily). Equate Brand     . pravastatin (PRAVACHOL) 10 MG tablet Take 10 mg by mouth at bedtime.     . ranitidine (ZANTAC) 150 MG tablet Take 150 mg by mouth 2 (two) times daily. BREAKFAST & BEDTIME.    . traZODone (DESYREL) 50 MG tablet Take 1-1.5 tablets (50-75 mg total) by mouth at bedtime as needed for sleep. 135 tablet 1  . Zoledronic Acid (RECLAST IV) Inject 1 Dose into the vein.     No current facility-administered medications for this visit.      Musculoskeletal: Strength & Muscle Tone: within normal limits Gait & Station: normal Patient leans: N/A  Psychiatric Specialty Exam: Review of Systems  Psychiatric/Behavioral: The patient is nervous/anxious (improving).   All other systems reviewed and are negative.   Blood pressure (!) 134/97,  pulse 76, temperature (!) 97.5 F (36.4  C), temperature source Oral, weight 110 lb 6.4 oz (50.1 kg).Body mass index is 21.56 kg/m.  General Appearance: Casual  Eye Contact:  Fair  Speech:  Clear and Coherent  Volume:  Normal  Mood:  Euthymic  Affect:  Congruent  Thought Process:  Goal Directed and Descriptions of Associations: Circumstantial  Orientation:  Full (Time, Place, and Person)  Thought Content: Logical   Suicidal Thoughts:  No  Homicidal Thoughts:  No  Memory:  Immediate;   Fair Recent;   Fair Remote;   Fair  Judgement:  Fair  Insight:  Fair  Psychomotor Activity:  Normal  Concentration:  Concentration: Fair and Attention Span: Fair  Recall:  [employer] of Knowledge: Fair  Language: Fair  Akathisia:  No  Handed:  Right  AIMS (if indicated): na  Assets:  Communication Skills Desire for Improvement Housing Social Support Talents/Skills Transportation Vocational/Educational  ADL's:  Intact  Cognition: WNL  Sleep:  Fair   Screenings:   Assessment and Plan: Reyonna is a 72 year old Caucasian female, divorced, lives in [place], has a history of bipolar disorder, multiple medical problems, presented to the clinic today for a follow-up visit.  Patient had recent medication changes done and is currently tolerating it well.  She reports her manic symptoms have improved and she feels better.  She also reports sleep as less restless.  Discussed plan as noted below.  Plan For bipolar disorder Continue Tegretol-XR 400 mg p.o. twice daily Tegretol level on 05/13/2017-therapeutic-8.3 mcg/mL Continue Depakote ER 500 mg p.o. twice daily. Depakote level 05/13/2017-59 mcg/mL-therapeutic  Insomnia Continue trazodone 75 mg p.o. nightly Continue hydroxyzine 10-20 mg p.o. nightly as needed.  Continues to have social support from friends.  Patient is also active, exercises regularly.  She also has Meals on [employer].  Patient does have upcoming appointments with pulmonologist  as well as for possible hearing aids with ENT.  Patient reports she will keep the same as scheduled.  Follow-up in clinic in 3 months or sooner if needed.  More than 50 % of the time was spent for psychoeducation and supportive psychotherapy and care coordination.  This note was generated in part or whole with voice recognition software. Voice recognition is usually quite accurate but there are transcription errors that can and very often do occur. I apologize for any typographical errors that were not detected and corrected.         Ursula Alert, MD 06/17/2017, 8:49 PM

## 2017-07-12 ENCOUNTER — Institutional Professional Consult (permissible substitution): Payer: Medicare Other | Admitting: Internal Medicine

## 2017-07-16 ENCOUNTER — Institutional Professional Consult (permissible substitution): Payer: Medicare Other | Admitting: Internal Medicine

## 2017-07-26 ENCOUNTER — Encounter: Payer: Self-pay | Admitting: Psychiatry

## 2017-07-26 ENCOUNTER — Other Ambulatory Visit: Payer: Self-pay

## 2017-07-26 ENCOUNTER — Ambulatory Visit: Payer: Medicare Other | Admitting: Psychiatry

## 2017-07-26 VITALS — BP 128/74 | HR 67 | Temp 97.8°F | Wt 109.0 lb

## 2017-07-26 DIAGNOSIS — F3110 Bipolar disorder, current episode manic without psychotic features, unspecified: Secondary | ICD-10-CM

## 2017-07-26 DIAGNOSIS — F411 Generalized anxiety disorder: Secondary | ICD-10-CM

## 2017-07-26 DIAGNOSIS — F5105 Insomnia due to other mental disorder: Secondary | ICD-10-CM | POA: Diagnosis not present

## 2017-07-26 NOTE — Progress Notes (Signed)
[facility] MD OP Progress Note  07/26/2017 12:53 PM Stephanie [name]  MRN:  [id number]  Chief Complaint: ' I am here for follow up." Chief Complaint    Follow-up; Depression; Medication Problem     HPI: Stephanie [name] is a 72 year old Caucasian female, divorced, lives in [place], presented to the clinic today for a follow-up visit.  Patient has a history of bipolar disorder as well as medical problems like coronary artery disease, GERD, Sjogren's syndrome, rheumatoid arthritis and so on.  She today presented along with her friend, Stephanie [name], provided collateral information.  Patient today reports she has noticed some increased anxiety since the past few weeks.  She reports several life changes.  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  She reports that makes her anxious.  She also reports that one of her friends ended up in the assisted living facility.  She reports she had some conflict with another friend.  Patient reports all these events made her worry more.  She reports she had some trouble with her utility bills recently. Pt reports she had to call several people to take care of that.  She reports even though she is anxious she does not want changes with her medications.  Discussed with her about referral for psychotherapy.  Discussed with her about referral to [name].  She agrees with plan.  She reports she is tolerating the Depakote ,the gabapentin and the Tegretol and the trazodone well.  She reports sleep is good on the trazodone.  She continues to need hydroxyzine some days.  She reports the combination helps her to rest.  She continues to follow-up with her other providers for management of her medical problems.   Visit Diagnosis:    ICD-10-CM   1. Bipolar I disorder, most recent episode (or current) manic ([place]) F31.10   2. Insomnia due to mental disorder F51.05   3. GAD (generalized anxiety disorder) F41.1      Past Psychiatric History: I have reviewed past psychiatric history from my progress note on 06/17/2017.  Past Medical History:  Past Medical History:  Diagnosis Date  . Anginal pain ([place])   . Anxiety   . Bipolar 1 disorder ([place])   . Bipolar disorder ([place])   . Coronary artery disease   . Depression   . Fatigue   . Fibromyalgia   . GERD (gastroesophageal reflux disease)   . Heart disease   . Heart failure ([place])   . HLD (hyperlipidemia)   . HOH (hard of hearing)   . Hypothyroidism   . IBS (irritable bowel syndrome)   . Pelvic pain in female   . Perianal lesion    high grade sil lesion- keratinizing type  . RA (rheumatoid arthritis) ([place])   . Raynaud phenomenon   . Sjogren's disease ([place])   . Sleep apnea   . Vaginal Pap smear, abnormal     Past Surgical History:  Procedure Laterality Date  . CATARACT EXTRACTION W/PHACO Left 02/23/2017   Procedure: CATARACT EXTRACTION PHACO AND INTRAOCULAR LENS PLACEMENT (IOC);  Surgeon: Birder Robson, MD;  Location: [facility] ORS;  Service: Ophthalmology;  Laterality: Left;  [place] 00:35 AP% 9.6 CDE 3.47 Fluid pack lot # [id number]  . CATARACT EXTRACTION W/PHACO Right 03/30/2017   Procedure: CATARACT EXTRACTION PHACO AND INTRAOCULAR LENS PLACEMENT ([place]);  Surgeon: Birder Robson, MD;  Location: [facility] ORS;  Service: Ophthalmology;  Laterality: Right;  [place] 00:32.6  AP% 13.8 CDE 4.51 Fluid Pack Lot # [id number]  . EYE SURGERY    . TONSILLECTOMY      Family Psychiatric History: I have reviewed family psychiatric history from my progress note on 06/17/2017.  Family History:  Family History  Problem Relation Age of Onset  . Heart failure Father   . Parkinson's disease Mother   . Bipolar disorder Mother   . Depression Sister   . Schizophrenia Paternal Grandmother   . Diabetes Maternal Grandmother   . Pancreatic cancer Maternal Grandmother   . Cancer Neg Hx   . Heart disease Neg Hx     Social History: Have reviewed social history from my  progress note on 06/17/2017.  Divorced, has 2 children, son who lives in [place] and a daughter who lives in [place].  She lives alone in [place].  She has supportive friends.   Social History   Socioeconomic History  . Marital status: Divorced    Spouse name: Not on file  . Number of children: 2  . Years of education: Not on file  . Highest education level: Master's degree (e.g., MA, MS, MEng, MEd, MSW, MBA)  Occupational History    Comment: retired2  Social Needs  . Financial resource strain: Somewhat hard  . Food insecurity:    Worry: Never true    Inability: Never true  . Transportation needs:    Medical: No    Non-medical: No  Tobacco Use  . Smoking status: Never Smoker  . Smokeless tobacco: Former Network engineer and Sexual Activity  . Alcohol use: No    Alcohol/week: 0.0 oz  . Drug use: No  . Sexual activity: Never    Birth control/protection: Post-menopausal  Lifestyle  . Physical activity:    Days per week: 0 days    Minutes per session: 0 min  . Stress: Only a little  Relationships  . Social connections:    Talks on phone: Not on file    Gets together: Not on file    Attends religious service: Never    Active member of club or organization: No    Attends meetings of clubs or organizations: Never    Relationship status: Divorced  Other Topics Concern  . Not on file  Social History Narrative  . Not on file    Allergies:  Allergies  Allergen Reactions  . Amoxicillin-Pot Clavulanate Nausea Only and Other (See Comments)    Diarrhea Has patient had a PCN reaction causing immediate rash, facial/tongue/throat swelling, SOB or lightheadedness with hypotension:  Has patient had a PCN reaction causing severe rash involving mucus membranes or skin necrosis: Has patient had a PCN reaction that required hospitalization:  Has patient had a PCN reaction occurring within the last 10 years: If all of the above answers are "NO", then may proceed with Cephalosporin  use.   Marland Kitchen Oxycodone-Acetaminophen Nausea Only  . Erythromycin Nausea Only  . Azithromycin Nausea And Vomiting  . Codeine   . Macrolides And Ketolides   . Oxycodone-Acetaminophen   . Penicillins Other (See Comments)    Has patient had a PCN reaction causing immediate rash, facial/tongue/throat swelling, SOB or lightheadedness with hypotension: NO Has patient had a PCN reaction causing severe rash involving mucus membranes or skin necrosis: NO Has patient had a PCN reaction that required hospitalization: NO Has patient had a PCN reaction occurring within the last 10 years: NO If all of the above answers are "NO", then may proceed with Cephalosporin use.   Marland Kitchen  Tramadol Nausea And Vomiting  . Lamotrigine Rash    LAMICTAL  . Sulfa Antibiotics Rash    Metabolic Disorder Labs: No results found for: HGBA1C, MPG No results found for: PROLACTIN No results found for: CHOL, TRIG, HDL, CHOLHDL, VLDL, LDLCALC No results found for: TSH  Therapeutic Level Labs: No results found for: LITHIUM Lab Results  Component Value Date   VALPROATE 59 05/13/2017   VALPROATE 58 02/22/2017   No components found for:  CBMZ  Current Medications: Current Outpatient Medications  Medication Sig Dispense Refill  . Abatacept (ORENCIA IV) Inject 1 Dose into the vein every 30 (thirty) days.    Marland Kitchen aspirin 81 MG chewable tablet Chew 81 mg by mouth daily with breakfast.    . Calcium Citrate-Vitamin D ([employer] CALCIUM CITRATE +D3 MINI) 200-250 MG-UNIT TABS Take 1 tablet by mouth daily after breakfast.     . carbamazepine (TEGRETOL-XR) 200 MG 12 hr tablet Take 2 tablets (400 mg total) by mouth 2 (two) times daily. 360 tablet 1  . cetirizine (ZYRTEC) 10 MG tablet Take by mouth.    . divalproex (DEPAKOTE ER) 500 MG 24 hr tablet Take 1 tablet (500 mg total) by mouth 2 (two) times daily. Take 1 tablet in the morning and 1 tablet at nighttime (Patient taking differently: Take 500 mg by mouth 2 (two) times daily. BREAKFAST &  BEDTIME) 381 tablet 1  . folic acid (FOLVITE) 1 MG tablet Take 2 mg by mouth daily with breakfast.     . gabapentin (NEURONTIN) 300 MG capsule Take 1 capsule (300 mg total) by mouth 2 (two) times daily. (Patient taking differently: Take 300 mg by mouth 2 (two) times daily. LUNCH & BEDTIME.) 180 capsule 1  . hydrOXYzine (ATARAX/VISTARIL) 10 MG tablet Take 1-2 tablets (10-20 mg total) by mouth at bedtime. For sleep issues 60 tablet 0  . levothyroxine (SYNTHROID, LEVOTHROID) 75 MCG tablet TAKE 1 TABLET BY MOUTH EACH DAY ON EMPTYSTOMACH WITH A GLASS OF WATERAT LEAST 30 TO 60 MINUTES BEFORE BREAKFAST IN THE MORNING    . methotrexate 2.5 MG tablet Take 15 mg by mouth every Wednesday. WEDNESDAYS AT LUNCH    . moxifloxacin (VIGAMOX) 0.5 % ophthalmic solution Place 1 drop into the right eye 2 (two) times daily.    . Multiple Vitamins-Minerals (MULTIVITAMIN WITH MINERALS) tablet Take 1 tablet by mouth daily with breakfast.     . nitroGLYCERIN (NITROSTAT) 0.4 MG SL tablet Place 0.4 mg under the tongue every 5 (five) minutes as needed for chest pain.     Marland Kitchen ondansetron (ZOFRAN) 8 MG tablet TAKE 1 TABLET BY MOUTH EVERY 12 HOURS ASNEEDED FOR NAUSEA    . Polyethyl Glycol-Propyl Glycol (SYSTANE) 0.4-0.3 % GEL ophthalmic gel Place 1 application into both eyes 3 (three) times daily as needed (typically twice daily). Equate Brand     . pravastatin (PRAVACHOL) 10 MG tablet Take 10 mg by mouth at bedtime.     . ranitidine (ZANTAC) 150 MG tablet Take 150 mg by mouth 2 (two) times daily. BREAKFAST & BEDTIME.    . traZODone (DESYREL) 50 MG tablet Take 1-1.5 tablets (50-75 mg total) by mouth at bedtime as needed for sleep. 135 tablet 1  . Zoledronic Acid (RECLAST IV) Inject 1 Dose into the vein.     No current facility-administered medications for this visit.      Musculoskeletal: Strength & Muscle Tone: within normal limits Gait & Station: normal Patient leans: N/A  Psychiatric Specialty Exam: Review of Systems  Psychiatric/Behavioral: The patient is nervous/anxious.   All other systems reviewed and are negative.   Blood pressure 128/74, pulse 67, temperature 97.8 F (36.6 C), temperature source Oral, weight 109 lb (49.4 kg).Body mass index is 21.29 kg/m.  General Appearance: Casual  Eye Contact:  Fair  Speech:  Normal Rate  Volume:  Normal  Mood:  Anxious  Affect:  Appropriate  Thought Process:  Goal Directed and Descriptions of Associations: Intact  Orientation:  Full (Time, Place, and Person)  Thought Content: WDL   Suicidal Thoughts:  No  Homicidal Thoughts:  No  Memory:  Immediate;   Fair Recent;   Fair Remote;   Fair  Judgement:  Fair  Insight:  Fair  Psychomotor Activity:  Normal  Concentration:  Concentration: Fair and Attention Span: Fair  Recall:  [employer] of Knowledge: Fair  Language: Fair  Akathisia:  No  Handed:  Right  AIMS (if indicated): denies rigidity,stiffness  Assets:  Communication Skills Desire for Improvement Housing Social Support  ADL's:  Intact  Cognition: WNL  Sleep:  Fair   Screenings:   Assessment and Plan: Freddye is a 72 year old Caucasian female, divorced, lives in [place], has a history of bipolar disorder, multiple medical problems, presented to the clinic today for a follow-up visit.  Patient reports increased anxiety due to several situational stressors.  She reports several life changes.  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  Discussed referral for psychotherapy.  Plan as noted below.  Plan For bipolar disorder Continue Tegretol-XR 400 mg p.o. twice daily Tegretol level on 05/13/2017-therapeutic at 8.3 mcg/mL Continue Depakote ER 500 mg p.o. twice daily. Depakote level on 05/14/2018 19-59 mcg/mL-therapeutic. Continue gabapentin as prescribed  For insomnia Continue trazodone 75 mg p.o. nightly Continue hydroxyzine 10-20 mg p.o. nightly as needed  Refer her to [name] for psychotherapy.  Patient  continues to have social support from her friend Stephanie [name].  Patient continues to be active, exercises regularly.  Follow up in clinic in 2 months or sooner if needed.  More than 50 % of the time was spent for psychoeducation and supportive psychotherapy and care coordination.  This note was generated in part or whole with voice recognition software. Voice recognition is usually quite accurate but there are transcription errors that can and very often do occur. I apologize for any typographical errors that were not detected and corrected.          Ursula Alert, MD 07/26/2017, 12:53 PM

## 2017-07-29 ENCOUNTER — Telehealth: Payer: Self-pay

## 2017-07-29 NOTE — Telephone Encounter (Signed)
ptt called states that she decided not to go to [facility] that she wants to go to [facility]. And she wanted to make sure you know that she is doing well.

## 2017-07-29 NOTE — Telephone Encounter (Signed)
Good, thanks.

## 2017-08-19 ENCOUNTER — Other Ambulatory Visit: Payer: Self-pay | Admitting: Psychiatry

## 2017-08-19 DIAGNOSIS — F3110 Bipolar disorder, current episode manic without psychotic features, unspecified: Secondary | ICD-10-CM

## 2017-08-20 NOTE — Telephone Encounter (Signed)
Sent 90 days supply to pharmacy

## 2017-08-30 ENCOUNTER — Encounter: Payer: Self-pay | Admitting: Psychiatry

## 2017-08-30 ENCOUNTER — Ambulatory Visit: Payer: Medicare Other | Admitting: Psychiatry

## 2017-08-30 ENCOUNTER — Other Ambulatory Visit: Payer: Self-pay

## 2017-08-30 VITALS — BP 127/73 | HR 73 | Temp 97.5°F | Wt 108.2 lb

## 2017-08-30 DIAGNOSIS — F3110 Bipolar disorder, current episode manic without psychotic features, unspecified: Secondary | ICD-10-CM

## 2017-08-30 DIAGNOSIS — F5105 Insomnia due to other mental disorder: Secondary | ICD-10-CM

## 2017-08-30 DIAGNOSIS — F411 Generalized anxiety disorder: Secondary | ICD-10-CM | POA: Diagnosis not present

## 2017-08-30 MED ORDER — CARIPRAZINE HCL 1.5 MG PO CAPS: 1.5000 mg | ORAL_CAPSULE | Freq: Every day | ORAL | 0 refills | Status: DC

## 2017-08-30 NOTE — Progress Notes (Signed)
[facility] MD OP Progress Note  08/30/2017 1:50 PM Stephanie [name]  MRN:  [id number]  Chief Complaint: ' I am here for follow up." Chief Complaint    Follow-up; Medication Refill     HPI: Stephanie [name] is a 72 year old Caucasian female, divorced, lives in [place], presented to the clinic today for a follow-up visit.  Patient has a history of bipolar disorder as well as medical problems like coronary artery disease, GERD, Sjogren's syndrome, rheumatoid arthritis and so on.  Patient today reports she has been having some mood lability recently.  She reports she has several psychosocial stressors.  She reports she had a conflict with a friend who moved away from to [place] recently.  Patient continues to ruminate about the same.  Patient also has relationship issues with her daughter.  Patient reports she has been talking to her daughter more and that kind of brings back some memories.  Patient reports she sleeps better on the hydroxyzine and trazodone combination.  There are nights that she takes 2 hydroxyzine pills to help her sleep.  She reports some recent self-injurious thoughts.  She however reports she would never hurt  herself and would never do anything like that to hurt her family.  She however reports she gets these kind of thoughts when she is not doing well mood wise.  Discussed adding Vrayalar.  Patient worried about side effects.  She is also worried about the risk associated with medications like antipsychotics given her age.  Discussed with patient that she can be continued on this medication for a short period of time if she tolerates it well.  This will help her through this phase that she is going through.  Also discussed her to start scheduling more frequent psychotherapy visits with [name].  Patient reports good benefit from her therapy sessions.  Also discussed referral to IOP or PHP program.  Patient however reports she is not interested at this time. Reports she is worried about  not being there at home to collect her Meals on Wheels if she is goes away to such programs.   Visit Diagnosis:    ICD-10-CM   1. Bipolar I disorder, most recent episode (or current) manic ([place]) F31.10   2. GAD (generalized anxiety disorder) F41.1   3. Insomnia due to mental disorder F51.05     Past Psychiatric History: Reviewed past psychiatric history from my progress note on 06/17/2017  Past Medical History:  Past Medical History:  Diagnosis Date  . Anginal pain ([place])   . Anxiety   . Bipolar 1 disorder ([place])   . Bipolar disorder ([place])   . Coronary artery disease   . Depression   . Fatigue   . Fibromyalgia   . GERD (gastroesophageal reflux disease)   . Heart disease   . Heart failure ([place])   . HLD (hyperlipidemia)   . HOH (hard of hearing)   . Hypothyroidism   . IBS (irritable bowel syndrome)   . Pelvic pain in female   . Perianal lesion    high grade sil lesion- keratinizing type  . RA (rheumatoid arthritis) ([place])   . Raynaud phenomenon   . Sjogren's disease ([place])   . Sleep apnea   . Vaginal Pap smear, abnormal     Past Surgical History:  Procedure Laterality Date  . CATARACT EXTRACTION W/PHACO Left 02/23/2017   Procedure: CATARACT EXTRACTION PHACO AND INTRAOCULAR LENS PLACEMENT (IOC);  Surgeon: Birder Robson, MD;  Location: [facility] ORS;  Service: Ophthalmology;  Laterality:  Left;  [place] 00:35 AP% 9.6 CDE 3.47 Fluid pack lot # [id number]  . CATARACT EXTRACTION W/PHACO Right 03/30/2017   Procedure: CATARACT EXTRACTION PHACO AND INTRAOCULAR LENS PLACEMENT ([place]);  Surgeon: Birder Robson, MD;  Location: [facility] ORS;  Service: Ophthalmology;  Laterality: Right;  [place] 00:32.6 AP% 13.8 CDE 4.51 Fluid Pack Lot # [id number]  . EYE SURGERY    . TONSILLECTOMY      Family Psychiatric History: Reviewed family psychiatric history from my progress note on 06/17/2017  Family History:  Family History  Problem Relation Age of Onset  . Heart failure Father   . Parkinson's disease  Mother   . Bipolar disorder Mother   . Depression Sister   . Schizophrenia Paternal Grandmother   . Diabetes Maternal Grandmother   . Pancreatic cancer Maternal Grandmother   . Cancer Neg Hx   . Heart disease Neg Hx     Social History: Reviewed social history from my progress note on 06/17/2017. Social History   Socioeconomic History  . Marital status: Divorced    Spouse name: Not on file  . Number of children: 2  . Years of education: Not on file  . Highest education level: Master's degree (e.g., MA, MS, MEng, MEd, MSW, MBA)  Occupational History    Comment: retired2  Social Needs  . Financial resource strain: Somewhat hard  . Food insecurity:    Worry: Never true    Inability: Never true  . Transportation needs:    Medical: No    Non-medical: No  Tobacco Use  . Smoking status: Never Smoker  . Smokeless tobacco: Former Network engineer and Sexual Activity  . Alcohol use: No    Alcohol/week: 0.0 oz  . Drug use: No  . Sexual activity: Never    Birth control/protection: Post-menopausal  Lifestyle  . Physical activity:    Days per week: 0 days    Minutes per session: 0 min  . Stress: Only a little  Relationships  . Social connections:    Talks on phone: Not on file    Gets together: Not on file    Attends religious service: Never    Active member of club or organization: No    Attends meetings of clubs or organizations: Never    Relationship status: Divorced  Other Topics Concern  . Not on file  Social History Narrative  . Not on file    Allergies:  Allergies  Allergen Reactions  . Amoxicillin-Pot Clavulanate Nausea Only and Other (See Comments)    Diarrhea Has patient had a PCN reaction causing immediate rash, facial/tongue/throat swelling, SOB or lightheadedness with hypotension:  Has patient had a PCN reaction causing severe rash involving mucus membranes or skin necrosis: Has patient had a PCN reaction that required hospitalization:  Has patient had a  PCN reaction occurring within the last 10 years: If all of the above answers are "NO", then may proceed with Cephalosporin use.   Marland Kitchen Oxycodone-Acetaminophen Nausea Only  . Erythromycin Nausea Only  . Azithromycin Nausea And Vomiting  . Codeine   . Macrolides And Ketolides   . Oxycodone-Acetaminophen   . Penicillins Other (See Comments)    Has patient had a PCN reaction causing immediate rash, facial/tongue/throat swelling, SOB or lightheadedness with hypotension: NO Has patient had a PCN reaction causing severe rash involving mucus membranes or skin necrosis: NO Has patient had a PCN reaction that required hospitalization: NO Has patient had a PCN reaction occurring within the last 10 years: NO If  all of the above answers are "NO", then may proceed with Cephalosporin use.   . Tramadol Nausea And Vomiting  . Lamotrigine Rash    LAMICTAL  . Sulfa Antibiotics Rash    Metabolic Disorder Labs: No results found for: HGBA1C, MPG No results found for: PROLACTIN No results found for: CHOL, TRIG, HDL, CHOLHDL, VLDL, LDLCALC No results found for: TSH  Therapeutic Level Labs: No results found for: LITHIUM Lab Results  Component Value Date   VALPROATE 59 05/13/2017   VALPROATE 58 02/22/2017   No components found for:  CBMZ  Current Medications: Current Outpatient Medications  Medication Sig Dispense Refill  . Abatacept (ORENCIA IV) Inject 1 Dose into the vein every 30 (thirty) days.    Marland Kitchen aspirin 81 MG chewable tablet Chew 81 mg by mouth daily with breakfast.    . Calcium Citrate-Vitamin D ([employer] CALCIUM CITRATE +D3 MINI) 200-250 MG-UNIT TABS Take 1 tablet by mouth daily after breakfast.     . carbamazepine (TEGRETOL XR) 200 MG 12 hr tablet TAKE 2 TABLETS BY MOUTH 2 TIMES DAILY 360 tablet 1  . cariprazine (VRAYLAR) capsule Take 1 capsule (1.5 mg total) by mouth daily. 30 capsule 0  . cetirizine (ZYRTEC) 10 MG tablet Take by mouth.    . divalproex (DEPAKOTE ER) 500 MG 24 hr tablet TAKE 1  TABLET BY MOUTH IN THE MORNING AND 1 TABLET AT NIGHT TIME 564 tablet 1  . folic acid (FOLVITE) 1 MG tablet Take 2 mg by mouth daily with breakfast.     . gabapentin (NEURONTIN) 300 MG capsule TAKE 1 CAPSULE BY MOUTH 2 TIMES DAILY 180 capsule 1  . hydrOXYzine (ATARAX/VISTARIL) 10 MG tablet TAKE 1 TO 2 TABLETS BY MOUTH AT BEDTIME FOR SLEEP ISSUES 60 tablet 0  . levothyroxine (SYNTHROID, LEVOTHROID) 75 MCG tablet TAKE 1 TABLET BY MOUTH EACH DAY ON EMPTYSTOMACH WITH A GLASS OF WATERAT LEAST 30 TO 60 MINUTES BEFORE BREAKFAST IN THE MORNING    . methotrexate 2.5 MG tablet Take 15 mg by mouth every Wednesday. WEDNESDAYS AT LUNCH    . moxifloxacin (VIGAMOX) 0.5 % ophthalmic solution Place 1 drop into the right eye 2 (two) times daily.    . Multiple Vitamins-Minerals (MULTIVITAMIN WITH MINERALS) tablet Take 1 tablet by mouth daily with breakfast.     . nitroGLYCERIN (NITROSTAT) 0.4 MG SL tablet Place 0.4 mg under the tongue every 5 (five) minutes as needed for chest pain.     Marland Kitchen ondansetron (ZOFRAN) 8 MG tablet TAKE 1 TABLET BY MOUTH EVERY 12 HOURS ASNEEDED FOR NAUSEA    . Polyethyl Glycol-Propyl Glycol (SYSTANE) 0.4-0.3 % GEL ophthalmic gel Place 1 application into both eyes 3 (three) times daily as needed (typically twice daily). Equate Brand     . pravastatin (PRAVACHOL) 10 MG tablet Take 10 mg by mouth at bedtime.     . ranitidine (ZANTAC) 150 MG tablet Take 150 mg by mouth 2 (two) times daily. BREAKFAST & BEDTIME.    . traZODone (DESYREL) 50 MG tablet TAKE 1 TO 1 AND 1/2 TABLETS BY MOUTH AT BEDTIME AS NEEDED FOR SLEEP 135 tablet 1  . Zoledronic Acid (RECLAST IV) Inject 1 Dose into the vein.     No current facility-administered medications for this visit.      Musculoskeletal: Strength & Muscle Tone: within normal limits Gait & Station: normal Patient leans: N/A  Psychiatric Specialty Exam: Review of Systems  Psychiatric/Behavioral: Positive for depression.  All other systems reviewed and are  negative.  Blood pressure 127/73, pulse 73, temperature (!) 97.5 F (36.4 C), temperature source Oral, weight 108 lb 3.2 oz (49.1 kg).Body mass index is 21.13 kg/m.  General Appearance: Casual  Eye Contact:  Fair  Speech:  Normal Rate  Volume:  Normal  Mood:  Dysphoric  Affect:  Congruent  Thought Process:  Goal Directed and Descriptions of Associations: Intact  Orientation:  Full (Time, Place, and Person)  Thought Content: Logical   Suicidal Thoughts:  No  Homicidal Thoughts:  No  Memory:  Immediate;   Fair Recent;   Fair Remote;   Fair  Judgement:  Fair  Insight:  Fair  Psychomotor Activity:  Normal  Concentration:  Concentration: Fair and Attention Span: Fair  Recall:  [employer] of Knowledge: Fair  Language: Fair  Akathisia:  No  Handed:  Right  AIMS (if indicated):denies tremors, rigidity  Assets:  Communication Skills Desire for Improvement Social Support  ADL's:  Intact  Cognition: WNL  Sleep:  Fair   Screenings:   Assessment and Plan: Stephanie [name] is a 89 year old Caucasian female, divorced, lives in [place], has a history of bipolar disorder, multiple medical problems, presented to the clinic today for a follow-up visit.  Patient today reports some mood lability and anxiety symptoms.  She does have psychosocial stressors.  Patient did have some recent self-injurious thoughts but has been able to distract herself and reports she would never act on those thoughts and denies any plan or active thoughts at this time.  Patient is motivated to make medication changes as well as pursue more intensive psychotherapy.  Plan as noted below.  Plan  Bipolar disorder Continue Tegretol-XR 400 mg p.o. twice daily. Tegretol level on 05/13/2017-therapeutic at 8.3 mcg/mL. Continue Depakote ER 500 mg p.o. twice daily. Depakote level on 05/13/2017-59 mcg/mL-therapeutic Continue gabapentin as prescribed. Start Vraylar 1.5 mg po qpm - provided samples.Pt given samples of Vraylar for 3 weeks.  If she tolerates it she will contact Jessica CMA for more samples. Provided medication education , including risk like falls, drowsiness, EPS.  For insomnia Continue trazodone 75 mg p.o. nightly Continue hydroxyzine 10-20 mg p.o. nightly as needed  Patient will continue psychotherapy with [name], however on a more intensive basis.  Discussed referral to IOP/PHP programs.  Patient reports she is not interested at this time. Also discussed inpatient mental health admission on geriatric unit possibly at [facility] if her symptoms worsens.  Discussed with her that there are only few geriatric units close by.  She reports she does not want to do that yet and would like to give medication time to work.  Patient to call her friend Lelon Frohlich, to let her know that she is currently on a new medication.  Patient reports she continues to have good social support from her friend and also her family.  Follow-up in clinic in 1 month.  Discussed with patient that writer will not be here in clinic parts of July.  More than 50 % of the time was spent for psychoeducation and supportive psychotherapy and care coordination.  This note was generated in part or whole with voice recognition software. Voice recognition is usually quite accurate but there are transcription errors that can and very often do occur. I apologize for any typographical errors that were not detected and corrected.   Ursula Alert, MD 08/30/2017, 1:50 PM

## 2017-08-30 NOTE — Patient Instructions (Signed)
Cariprazine oral capsules What is this medicine? CARIPRAZINE (car i PRA zeen) is an antipsychotic. It is used to treat schizophrenia or bipolar disorder. Bipolar disorder is also known as manic-depression. This medicine may be used for other purposes; ask your health care provider or pharmacist if you have questions. COMMON BRAND NAME(S): VRAYLAR What should I tell my health care provider before I take this medicine? They need to know if you have any of these conditions: -dementia -diabetes -difficulty swallowing -heart disease -history of breast cancer -kidney disease -liver disease -low blood counts, like low white cell, platelet, or red cell counts -low blood pressure -Parkinson's disease -seizures -suicidal thoughts, plans, or attempt; a previous suicide attempt by you or a family member -an unusual or allergic reaction to cariprazine, other medicines, foods, dyes, or preservatives -pregnant or trying to get pregnant -breast-feeding How should I use this medicine? Take this medicine by mouth with a glass of water. Follow the directions on the prescription label. You may take it with or without food. Take your medicine at regular intervals. Do not take it more often than directed. Do not stop taking except on your doctor's advice. Talk to your pediatrician regarding the use of this medicine in children. Special care may be needed. Overdosage: If you think you have taken too much of this medicine contact a poison control center or emergency room at once. NOTE: This medicine is only for you. Do not share this medicine with others. What if I miss a dose? If you miss a dose, take it as soon as you can. If it is almost time for your next dose, take only that dose. Do not take double or extra doses. What may interact with this medicine? Do not take this medicine with any of the following medications: -metoclopramide This medicine may also interact with the following  medications: -carbamazepine -certain medicines for depression, anxiety, or psychotic disturbances -certain medicines for sleep -itraconazole -ketoconazole -medicines for blood pressure -rifampin -St John's wort This list may not describe all possible interactions. Give your health care provider a list of all the medicines, herbs, non-prescription drugs, or dietary supplements you use. Also tell them if you smoke, drink alcohol, or use illegal drugs. Some items may interact with your medicine. What should I watch for while using this medicine? Visit your doctor or health care professional for regular checks on your progress. It may be several weeks before you see the full effects of this medicine. Notify your doctor or health care professional if your symptoms get worse, if you have new symptoms, if you are having an unusual effect from this medicine, or if you feel out of control, very discouraged or think you might harm yourself or others. Do not suddenly stop taking this medicine. You may need to gradually reduce the dose. Ask your doctor or health care professional for advice. You may get dizzy or drowsy. Do not drive, use machinery, or do anything that needs mental alertness until you know how this medicine affects you. Do not stand or sit up quickly, especially if you are an older patient. This reduces the risk of dizzy or fainting spells. Alcohol can increase dizziness and drowsiness. Avoid alcoholic drinks. This medicine may cause dry eyes and blurred vision. If you wear contact lenses you may feel some discomfort. Lubricating drops may help. See your eye doctor if the problem does not go away or is severe. This medicine can reduce the response of your body to heat or cold. Dress   warm in cold weather and stay hydrated in hot weather. If possible, avoid extreme temperatures like saunas, hot tubs, very hot or cold showers, or activities that can cause dehydration such as vigorous exercise. Women  should inform their doctor if they wish to become pregnant or think they might be pregnant. The effects of this medicine on an unborn child are not known. A registry is available to monitor pregnancy outcomes in pregnant women exposed to this medicine or similar medicines. Talk to your health care professional or pharmacist for more information. What side effects may I notice from receiving this medicine? Side effects that you should report to your doctor or health care professional as soon as possible: -allergic reactions like skin rash, itching or hives, swelling of the face, lips, or tongue -changes in emotions or moods -confusion -difficulty swallowing -feeling faint or lightheaded, falls -fever or chills, sore throat -inability to control muscle movements in the face, mouth, hands, arms, or legs -increased hunger or thirst -increased urination -missed or irregular menstrual periods -problems with balance, talking, walking -redness, blistering, peeling or loosening of the skin, including inside the mouth -seizures -stiff muscles -suicidal thoughts or other mood changes -unusually weak or tired Side effects that usually do not require medical attention (report to your doctor or health care professional if they continue or are bothersome): -constipation -dizziness -drowsiness -nausea, vomiting -restlessness -upset stomach -weight gain This list may not describe all possible side effects. Call your doctor for medical advice about side effects. You may report side effects to FDA at 1-800-FDA-1088. Where should I keep my medicine? Keep out of the reach of children. Store at room temperature between 15 and 30 degrees C (59 and 86 degrees F). Protect from light. Throw away any unused medicine after the expiration date. NOTE: This sheet is a summary. It may not cover all possible information. If you have questions about this medicine, talk to your doctor, pharmacist, or health care  provider.  2018 Elsevier/Gold Standard (2016-01-24 15:40:17)

## 2017-09-02 ENCOUNTER — Telehealth: Payer: Self-pay

## 2017-09-02 NOTE — Telephone Encounter (Signed)
left message/ tried to get more info but it went to voice mail.

## 2017-09-02 NOTE — Telephone Encounter (Signed)
pt called left messages that she concerned about the side affects of the vraylar. and states that she sleeping well but she worried bout the affect it causes.

## 2017-09-03 MED ORDER — CARIPRAZINE HCL 1.5 MG PO CAPS
1.5000 mg | ORAL_CAPSULE | Freq: Every day | ORAL | 1 refills | Status: DC
Start: 2017-09-03 — End: 2017-10-11

## 2017-09-03 NOTE — Telephone Encounter (Signed)
Called pt to discuss concerns about Vraylar. Pt reports mood as improved. Pt reports some nausea with medication but has resolved. Pt wants writer to call pharmacy too discuss her medications needs to be in a bottle and not in bubble pack. Called pharmacy to discuss , medication will be ready Monday.  Also will let Janett Billow know to give enough samples of vraylar.  Pt aware that writer is not going to be here in July .

## 2017-09-16 ENCOUNTER — Ambulatory Visit: Payer: Medicare Other | Admitting: Psychiatry

## 2017-10-05 ENCOUNTER — Telehealth: Payer: Self-pay

## 2017-10-05 NOTE — Telephone Encounter (Signed)
pt called left message tha she is not sleeping and that she feels like she needs a medication adjustment.

## 2017-10-05 NOTE — Telephone Encounter (Signed)
pt called left message that she only been getting about 2 hours of sleep and that she is starting to feel manic.

## 2017-10-11 ENCOUNTER — Other Ambulatory Visit: Payer: Self-pay

## 2017-10-11 ENCOUNTER — Encounter: Payer: Self-pay | Admitting: Psychiatry

## 2017-10-11 ENCOUNTER — Ambulatory Visit (INDEPENDENT_AMBULATORY_CARE_PROVIDER_SITE_OTHER): Payer: Medicare Other | Admitting: Psychiatry

## 2017-10-11 VITALS — BP 130/80 | HR 89 | Temp 97.7°F | Wt 109.8 lb

## 2017-10-11 DIAGNOSIS — F5105 Insomnia due to other mental disorder: Secondary | ICD-10-CM

## 2017-10-11 DIAGNOSIS — F3110 Bipolar disorder, current episode manic without psychotic features, unspecified: Secondary | ICD-10-CM

## 2017-10-11 DIAGNOSIS — F411 Generalized anxiety disorder: Secondary | ICD-10-CM | POA: Diagnosis not present

## 2017-10-11 MED ORDER — TRAZODONE HCL 50 MG PO TABS: 100.0000 mg | ORAL_TABLET | Freq: Every day | ORAL | 1 refills | Status: DC

## 2017-10-11 MED ORDER — CARIPRAZINE HCL 3 MG PO CAPS: 3.0000 mg | ORAL_CAPSULE | Freq: Every day | ORAL | 1 refills | Status: DC

## 2017-10-11 NOTE — Progress Notes (Signed)
Gonzales MD  OP Progress Note  10/11/2017 4:19 PM Stephanie [name]  MRN:  [id number]  Chief Complaint: ' I am here for follow up." Chief Complaint    Follow-up; Medication Refill; Insomnia     HPI: Stephanie [name] is a 72 year old Caucasian female, divorced, lives in [place], presented to the clinic today for a follow-up visit.  Patient has a history of bipolar disorder as well as medical problems like coronary artery disease , sjogren's syndrome, rheumatoid arthritis.  Patient today reports she has been unable to sleep since the past few days.  She reports feeling manic with pressured speech often.  She reports she has been more creative and has been working on projects at home.  She also has been working at a vacation [employer] the past few days.  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  Patient reports she continues to be compliant with her other medications and has been tolerating Vraylar well.   Discussed increasing her Vraylar , as well as discussed side effects including EPS, TD.  Patient agrees with plan.  Also discussed increasing her trazodone to address his sleep problems.  Patient also advised to take hydroxyzine as needed if she continues to struggle with sleep or cannot go back to sleep in the middle of the night.  She reports she has no difficulty falling asleep but wakes up in the middle of the night.  Patient did not express any suicidality or homicidality.  Patient continues to have good social support system from her family, friend as well as has Meals on [employer].   Visit Diagnosis:    ICD-10-CM   1. Bipolar I disorder, most recent episode (or current) manic ([place]) F31.10 traZODone (DESYREL) 50 MG tablet  2. GAD (generalized anxiety disorder) F41.1   3. Insomnia due to mental disorder F51.05     Past Psychiatric History: I have reviewed past psychiatric history from my progress note on 06/17/2017  Past Medical History:  Past Medical History:   Diagnosis Date  . Anginal pain ([place])   . Anxiety   . Bipolar 1 disorder ([place])   . Bipolar disorder ([place])   . Coronary artery disease   . Depression   . Fatigue   . Fibromyalgia   . GERD (gastroesophageal reflux disease)   . Heart disease   . Heart failure ([place])   . HLD (hyperlipidemia)   . HOH (hard of hearing)   . Hypothyroidism   . IBS (irritable bowel syndrome)   . Pelvic pain in female   . Perianal lesion    high grade sil lesion- keratinizing type  . RA (rheumatoid arthritis) ([place])   . Raynaud phenomenon   . Sjogren's disease ([place])   . Sleep apnea   . Vaginal Pap smear, abnormal     Past Surgical History:  Procedure Laterality Date  . CATARACT EXTRACTION W/PHACO Left 02/23/2017   Procedure: CATARACT EXTRACTION PHACO AND INTRAOCULAR LENS PLACEMENT (IOC);  Surgeon: Birder Robson, MD;  Location: [facility] ORS;  Service: Ophthalmology;  Laterality: Left;  [place] 00:35 AP% 9.6 CDE 3.47 Fluid pack lot # [id number]  . CATARACT EXTRACTION W/PHACO Right 03/30/2017   Procedure: CATARACT EXTRACTION PHACO AND INTRAOCULAR LENS PLACEMENT ([place]);  Surgeon: Birder Robson, MD;  Location: [facility] ORS;  Service: Ophthalmology;  Laterality: Right;  [place] 00:32.6 AP% 13.8 CDE 4.51 Fluid Pack Lot # [id number]  . EYE SURGERY    . TONSILLECTOMY      Family Psychiatric  History: I have reviewed family psychiatric history from my progress note on 06/17/2017  Family History:  Family History  Problem Relation Age of Onset  . Heart failure Father   . Parkinson's disease Mother   . Bipolar disorder Mother   . Depression Sister   . Schizophrenia Paternal Grandmother   . Diabetes Maternal Grandmother   . Pancreatic cancer Maternal Grandmother   . Cancer Neg Hx   . Heart disease Neg Hx    Substance abuse history: Denies  Social History: I have reviewed social history from my progress note on 06/17/2017 Social History   Socioeconomic History  . Marital status: Divorced    Spouse name: Not on  file  . Number of children: 2  . Years of education: Not on file  . Highest education level: Master's degree (e.g., MA, MS, MEng, MEd, MSW, MBA)  Occupational History    Comment: retired2  Social Needs  . Financial resource strain: Somewhat hard  . Food insecurity:    Worry: Never true    Inability: Never true  . Transportation needs:    Medical: No    Non-medical: No  Tobacco Use  . Smoking status: Never Smoker  . Smokeless tobacco: Former Network engineer and Sexual Activity  . Alcohol use: No    Alcohol/week: 0.0 oz  . Drug use: No  . Sexual activity: Never    Birth control/protection: Post-menopausal  Lifestyle  . Physical activity:    Days per week: 0 days    Minutes per session: 0 min  . Stress: Only a little  Relationships  . Social connections:    Talks on phone: Not on file    Gets together: Not on file    Attends religious service: Never    Active member of club or organization: No    Attends meetings of clubs or organizations: Never    Relationship status: Divorced  Other Topics Concern  . Not on file  Social History Narrative  . Not on file    Allergies:  Allergies  Allergen Reactions  . Amoxicillin-Pot Clavulanate Nausea Only and Other (See Comments)    Diarrhea Has patient had a PCN reaction causing immediate rash, facial/tongue/throat swelling, SOB or lightheadedness with hypotension:  Has patient had a PCN reaction causing severe rash involving mucus membranes or skin necrosis: Has patient had a PCN reaction that required hospitalization:  Has patient had a PCN reaction occurring within the last 10 years: If all of the above answers are "NO", then may proceed with Cephalosporin use.   Marland Kitchen Oxycodone-Acetaminophen Nausea Only  . Erythromycin Nausea Only  . Azithromycin Nausea And Vomiting  . Codeine   . Macrolides And Ketolides   . Oxycodone-Acetaminophen   . Penicillins Other (See Comments)    Has patient had a PCN reaction causing immediate  rash, facial/tongue/throat swelling, SOB or lightheadedness with hypotension: NO Has patient had a PCN reaction causing severe rash involving mucus membranes or skin necrosis: NO Has patient had a PCN reaction that required hospitalization: NO Has patient had a PCN reaction occurring within the last 10 years: NO If all of the above answers are "NO", then may proceed with Cephalosporin use.   . Tramadol Nausea And Vomiting  . Lamotrigine Rash    LAMICTAL  . Sulfa Antibiotics Rash    Metabolic Disorder Labs: No results found for: HGBA1C, MPG No results found for: PROLACTIN No results found for: CHOL, TRIG, HDL, CHOLHDL, VLDL, LDLCALC No results found for: TSH  Therapeutic Level Labs: No results found for: LITHIUM Lab Results  Component Value Date   VALPROATE 59 05/13/2017   VALPROATE 58 02/22/2017   No components found for:  CBMZ  Current Medications: Current Outpatient Medications  Medication Sig Dispense Refill  . Abatacept (ORENCIA IV) Inject 1 Dose into the vein every 30 (thirty) days.    Marland Kitchen aspirin 81 MG chewable tablet Chew 81 mg by mouth daily with breakfast.    . Calcium Citrate-Vitamin D ([employer] CALCIUM CITRATE +D3 MINI) 200-250 MG-UNIT TABS Take 1 tablet by mouth daily after breakfast.     . carbamazepine (TEGRETOL XR) 200 MG 12 hr tablet TAKE 2 TABLETS BY MOUTH 2 TIMES DAILY 360 tablet 1  . cetirizine (ZYRTEC) 10 MG tablet Take by mouth.    . divalproex (DEPAKOTE ER) 500 MG 24 hr tablet TAKE 1 TABLET BY MOUTH IN THE MORNING AND 1 TABLET AT NIGHT TIME 891 tablet 1  . folic acid (FOLVITE) 1 MG tablet Take 2 mg by mouth daily with breakfast.     . gabapentin (NEURONTIN) 300 MG capsule TAKE 1 CAPSULE BY MOUTH 2 TIMES DAILY 180 capsule 1  . hydrOXYzine (ATARAX/VISTARIL) 10 MG tablet TAKE 1 TO 2 TABLETS BY MOUTH AT BEDTIME FOR SLEEP ISSUES 60 tablet 0  . levothyroxine (SYNTHROID, LEVOTHROID) 75 MCG tablet TAKE 1 TABLET BY MOUTH EACH DAY ON EMPTYSTOMACH WITH A GLASS OF WATERAT  LEAST 30 TO 60 MINUTES BEFORE BREAKFAST IN THE MORNING    . methotrexate 2.5 MG tablet Take 15 mg by mouth every Wednesday. WEDNESDAYS AT LUNCH    . moxifloxacin (VIGAMOX) 0.5 % ophthalmic solution Place 1 drop into the right eye 2 (two) times daily.    . Multiple Vitamins-Minerals (MULTIVITAMIN WITH MINERALS) tablet Take 1 tablet by mouth daily with breakfast.     . nitroGLYCERIN (NITROSTAT) 0.4 MG SL tablet Place 0.4 mg under the tongue every 5 (five) minutes as needed for chest pain.     Marland Kitchen ondansetron (ZOFRAN) 8 MG tablet TAKE 1 TABLET BY MOUTH EVERY 12 HOURS ASNEEDED FOR NAUSEA    . Polyethyl Glycol-Propyl Glycol (SYSTANE) 0.4-0.3 % GEL ophthalmic gel Place 1 application into both eyes 3 (three) times daily as needed (typically twice daily). Equate Brand     . pravastatin (PRAVACHOL) 10 MG tablet Take 10 mg by mouth at bedtime.     . ranitidine (ZANTAC) 150 MG tablet Take 150 mg by mouth 2 (two) times daily. BREAKFAST & BEDTIME.    . traZODone (DESYREL) 50 MG tablet Take 2 tablets (100 mg total) by mouth at bedtime. 180 tablet 1  . Zoledronic Acid (RECLAST IV) Inject 1 Dose into the vein.    . cariprazine (VRAYLAR) capsule Take 1 capsule (3 mg total) by mouth daily. 30 capsule 1   No current facility-administered medications for this visit.      Musculoskeletal: Strength & Muscle Tone: within normal limits Gait & Station: normal Patient leans: N/A  Psychiatric Specialty Exam: Review of Systems  Psychiatric/Behavioral: The patient is nervous/anxious and has insomnia.   All other systems reviewed and are negative.   Blood pressure 130/80, pulse 89, temperature 97.7 F (36.5 C), temperature source Oral, weight 109 lb 12.8 oz (49.8 kg).Body mass index is 21.44 kg/m.  General Appearance: Casual  Eye Contact:  Fair  Speech:  Pressured  Volume:  Normal  Mood:  Anxious  Affect:  Appropriate  Thought Process:  Goal Directed and Descriptions of Associations: Intact  Orientation:  Full  (  Time, Place, and Person)  Thought Content: Logical   Suicidal Thoughts:  No  Homicidal Thoughts:  No  Memory:  Immediate;   Fair Recent;   Fair Remote;   Fair  Judgement:  Fair  Insight:  Fair  Psychomotor Activity:  Normal  Concentration:  Concentration: Fair and Attention Span: Fair  Recall:  [employer] of Knowledge: Fair  Language: Fair  Akathisia:  No  Handed:  Right  AIMS (if indicated): 0  Assets:  Communication Skills Desire for Improvement Housing  ADL's:  Intact  Cognition: WNL  Sleep:  Poor   Screenings:   Assessment and Plan: Toree is a 72 year old Caucasian female, divorced, lives in [place] has a history of bipolar disorder, multiple medical problems, presented to the clinic today for a follow-up visit.  Patient today reports hypomanic symptoms like pressured speech, sleep problems and so on.  She is currently on Vraylar , discussed readjusting her dosage.  Also will make the following medication changes.  Plan Bipolar disorder Continue Tegretol-XR 400 mg p.o. twice daily. Tegretol level on 05/13/2017-therapeutic at 8.3 mcg/mL. Continue Depakote ER 500 mg p.o. twice daily. Depakote level on 05/13/2017-59 mcg/mL-therapeutic. Continue gabapentin as prescribed Increase Vraylar -3 mg, change the dosage timing to morning instead of p.m.  This was discussed with patient. Aims equals 0  For insomnia Increase trazodone to 100 mg p.o. nightly Continue hydroxyzine 10-20 mg p.o. nightly as needed. Discussed with patient that switching the time of Arman Filter might also help her.  She will continue psychotherapy with [name].  Follow-up in clinic in 1 month.  More than 50 % of the time was spent for psychoeducation and supportive psychotherapy and care coordination.  This note was generated in part or whole with voice recognition software. Voice recognition is usually quite accurate but there are transcription errors that can and very often do occur. I apologize  for any typographical errors that were not detected and corrected.         Ursula Alert, MD 10/11/2017, 4:19 PM

## 2017-10-11 NOTE — Patient Instructions (Signed)
Please change your timing of Vraylar to morning with breakfast.  Will also increase your Vraylar 3 mg daily.  Will increase Trazodone to 100 mg ( 2 tablets of 50 mg ) for sleep at bedtime.

## 2017-10-11 NOTE — Telephone Encounter (Signed)
PT CAME INTO OFFICE TODAY  10-11-17

## 2017-10-14 ENCOUNTER — Other Ambulatory Visit: Payer: Self-pay | Admitting: Psychiatry

## 2017-10-14 DIAGNOSIS — F3110 Bipolar disorder, current episode manic without psychotic features, unspecified: Secondary | ICD-10-CM

## 2017-10-15 ENCOUNTER — Telehealth: Payer: Self-pay

## 2017-10-15 NOTE — Telephone Encounter (Signed)
medication was approved [place]- [id number] from 10-12-17 to 03-08-18 for vraylar cap 3mg .

## 2017-10-18 ENCOUNTER — Telehealth: Payer: Self-pay

## 2017-10-18 ENCOUNTER — Ambulatory Visit (INDEPENDENT_AMBULATORY_CARE_PROVIDER_SITE_OTHER): Payer: Medicare Other | Admitting: Psychiatry

## 2017-10-18 ENCOUNTER — Encounter: Payer: Self-pay | Admitting: Psychiatry

## 2017-10-18 ENCOUNTER — Other Ambulatory Visit: Payer: Self-pay

## 2017-10-18 VITALS — BP 134/86 | HR 68 | Temp 97.4°F | Wt 112.0 lb

## 2017-10-18 DIAGNOSIS — F411 Generalized anxiety disorder: Secondary | ICD-10-CM | POA: Diagnosis not present

## 2017-10-18 DIAGNOSIS — F5105 Insomnia due to other mental disorder: Secondary | ICD-10-CM

## 2017-10-18 DIAGNOSIS — F3110 Bipolar disorder, current episode manic without psychotic features, unspecified: Secondary | ICD-10-CM | POA: Diagnosis not present

## 2017-10-18 MED ORDER — ESZOPICLONE 2 MG PO TABS: 2.0000 mg | ORAL_TABLET | Freq: Every day | ORAL | 1 refills | Status: DC

## 2017-10-18 NOTE — Telephone Encounter (Signed)
Ok Seen her thanks

## 2017-10-18 NOTE — Patient Instructions (Signed)
Eszopiclone tablets What is this medicine? ESZOPICLONE (es ZOE pi clone) is used to treat insomnia. This medicine helps you to fall asleep and sleep through the night. This medicine may be used for other purposes; ask your health care provider or pharmacist if you have questions. COMMON BRAND NAME(S): Lunesta What should I tell my health care provider before I take this medicine? They need to know if you have any of these conditions: -depression -history of a drug or alcohol abuse problem -liver disease -lung or breathing disease -suicidal thoughts -an unusual or allergic reaction to eszopiclone, other medicines, foods, dyes, or preservatives -pregnant or trying to get pregnant -breast-feeding How should I use this medicine? Take this medicine by mouth with a glass of water. Follow the directions on the prescription label. It is better to take this medicine on an empty stomach and only when you are ready for bed. Do not take your medicine more often than directed. If you have been taking this medicine for several weeks and suddenly stop taking it, you may get unpleasant withdrawal symptoms. Your doctor or health care professional may want to gradually reduce the dose. Do not stop taking this medicine on your own. Always follow your doctor or health care professional's advice. Talk to your pediatrician regarding the use of this medicine in children. Special care may be needed. Overdosage: If you think you have taken too much of this medicine contact a poison control center or emergency room at once. NOTE: This medicine is only for you. Do not share this medicine with others. What if I miss a dose? This does not apply. This medicine should only be taken immediately before going to sleep. Do not take double or extra doses. What may interact with this medicine? -herbal medicines like kava kava, melatonin, St. John's wort and valerian -lorazepam -medicines for fungal infections like ketoconazole,  fluconazole, or itraconazole -olanzapine This list may not describe all possible interactions. Give your health care provider a list of all the medicines, herbs, non-prescription drugs, or dietary supplements you use. Also tell them if you smoke, drink alcohol, or use illegal drugs. Some items may interact with your medicine. What should I watch for while using this medicine? Visit your doctor or health care professional for regular checks on your progress. Keep a regular sleep schedule by going to bed at about the same time nightly. Avoid caffeine-containing drinks in the evening hours, as caffeine can cause trouble with falling asleep. Talk to your doctor if you still have trouble sleeping. After taking this medicine for sleep, you may get up out of bed while not being fully awake and do an activity that you do not know you are doing. The next morning, you may have no memory of the event. Activities such as driving a car ("sleep-driving"), making and eating food, talking on the phone, sexual activity, and sleep-walking have been reported. Call your doctor right away if you find out you have done any of these activities. Do not take this medicine if you have used alcohol that evening or before bed or taken another medicine for sleep, since your risk of doing these sleep-related activities will be increased. Do not take this medicine unless you are able to stay in bed for a full night (7 to 8 hours) before you must be active again. You may have a decrease in mental alertness the day after use, even if you feel that you are fully awake. Tell your doctor if you will need to   perform activities requiring full alertness, such as driving, the next day. Do not stand or sit up quickly after taking this medicine, especially if you are an older patient. This reduces the risk of dizzy or fainting spells. If you or your family notice any changes in your behavior, such as new or worsening depression, thoughts of harming  yourself, anxiety, other unusual or disturbing thoughts, or memory loss, call your doctor right away. After you stop taking this medicine, you may have trouble falling asleep. This is called rebound insomnia. This problem usually goes away on its own after 1 or 2 nights. What side effects may I notice from receiving this medicine? Side effects that you should report to your doctor or health care professional as soon as possible: -allergic reactions like skin rash, itching or hives, swelling of the face, lips, or tongue -changes in vision -confusion -depressed mood -feeling faint or lightheaded, falls -hallucinations -problems with balance, speaking, walking -restlessness, excitability, or feelings of agitation -unusual activities while asleep like driving, eating, making phone calls Side effects that usually do not require medical attention (report to your doctor or health care professional if they continue or are bothersome): -dizziness, or daytime drowsiness, sometimes called a hangover effect -headache This list may not describe all possible side effects. Call your doctor for medical advice about side effects. You may report side effects to FDA at 1-800-FDA-1088. Where should I keep my medicine? Keep out of the reach of children. This medicine can be abused. Keep your medicine in a safe place to protect it from theft. Do not share this medicine with anyone. Selling or giving away this medicine is dangerous and against the law. This medicine may cause accidental overdose and death if taken by other adults, children, or pets. Mix any unused medicine with a substance like cat litter or coffee grounds. Then throw the medicine away in a sealed container like a sealed bag or a coffee can with a lid. Do not use the medicine after the expiration date. Store at room temperature between 15 and 30 degrees C (59 and 86 degrees F). NOTE: This sheet is a summary. It may not cover all possible information.  If you have questions about this medicine, talk to your doctor, pharmacist, or health care provider.  2018 Elsevier/Gold Standard (2013-11-14 15:22:01)  

## 2017-10-18 NOTE — Progress Notes (Signed)
[facility] MD OP Progress Note  10/18/2017 4:15 PM Stephanie [name]  MRN:  [id number]  Chief Complaint: ' I am here since I am not sleeping." Chief Complaint    Follow-up; Medication Problem; Insomnia     HPI: Stephanie [name] is a 72 yr old Caucasian female, divorced, lives in [place], presented to the clinic today for a follow-up visit.  Patient has a history of bipolar disorder, medical problems like coronary artery disease,Sjogren's syndrome, rheumatoid arthritis.  Patient today walked into the clinic since she continued to have sleep problems.  Patient reports changing the Vraylar as discussed to a.m. and also increasing the trazodone to 100 mg helped her for a day or 2 however she started going downhill again.  She reports she started sleeping again only for 2-3 hours at night and she would spend the rest of the time listening to music or doing a project which was not too stimulating.  She reports she is concerned about this since it has been affecting her overall mood and she also has been struggling to stay focused.  Patient reports trying medications like Seroquel in the past as well as Ambien.  She did not like the effect of Seroquel and reports she may have had falls with Ambien.  Discussed adding Lunesta.  Patient agrees with plan.  Provided her education about adding medications like Lunesta which can be habit forming as well as can cause side effects like that dizziness falls and cognitive issues in the long run.    Patient denies any suicidality or perceptual disturbances.  Visit Diagnosis:    ICD-10-CM   1. Bipolar I disorder, most recent episode (or current) manic ([place]) F31.10   2. GAD (generalized anxiety disorder) F41.1   3. Insomnia due to mental disorder F51.05 eszopiclone (LUNESTA) 2 MG TABS tablet    Past Psychiatric History: I have reviewed past psychiatric history from my progress note on 06/17/2017.  Past Medical History:  Past Medical History:  Diagnosis Date  . Anginal pain ([place])    . Anxiety   . Bipolar 1 disorder ([place])   . Bipolar disorder ([place])   . Coronary artery disease   . Depression   . Fatigue   . Fibromyalgia   . GERD (gastroesophageal reflux disease)   . Heart disease   . Heart failure ([place])   . HLD (hyperlipidemia)   . HOH (hard of hearing)   . Hypothyroidism   . IBS (irritable bowel syndrome)   . Pelvic pain in female   . Perianal lesion    high grade sil lesion- keratinizing type  . RA (rheumatoid arthritis) ([place])   . Raynaud phenomenon   . Sjogren's disease ([place])   . Sleep apnea   . Vaginal Pap smear, abnormal     Past Surgical History:  Procedure Laterality Date  . CATARACT EXTRACTION W/PHACO Left 02/23/2017   Procedure: CATARACT EXTRACTION PHACO AND INTRAOCULAR LENS PLACEMENT (IOC);  Surgeon: Birder Robson, MD;  Location: [facility] ORS;  Service: Ophthalmology;  Laterality: Left;  [place] 00:35 AP% 9.6 CDE 3.47 Fluid pack lot # [id number]  . CATARACT EXTRACTION W/PHACO Right 03/30/2017   Procedure: CATARACT EXTRACTION PHACO AND INTRAOCULAR LENS PLACEMENT ([place]);  Surgeon: Birder Robson, MD;  Location: [facility] ORS;  Service: Ophthalmology;  Laterality: Right;  [place] 00:32.6 AP% 13.8 CDE 4.51 Fluid Pack Lot # [id number]  . EYE SURGERY    . TONSILLECTOMY      Family Psychiatric History: Reviewed family psychiatric history from my progress note on 06/17/2017.  Family History:  Family History  Problem Relation Age of Onset  . Heart failure Father   . Parkinson's disease Mother   . Bipolar disorder Mother   . Depression Sister   . Schizophrenia Paternal Grandmother   . Diabetes Maternal Grandmother   . Pancreatic cancer Maternal Grandmother   . Cancer Neg Hx   . Heart disease Neg Hx     Social History: Reviewed social history from my progress note on 06/17/2017. Social History   Socioeconomic History  . Marital status: Divorced    Spouse name: Not on file  . Number of children: 2  . Years of education: Not on file  . Highest education  level: Master's degree (e.g., MA, MS, MEng, MEd, MSW, MBA)  Occupational History    Comment: retired2  Social Needs  . Financial resource strain: Somewhat hard  . Food insecurity:    Worry: Never true    Inability: Never true  . Transportation needs:    Medical: No    Non-medical: No  Tobacco Use  . Smoking status: Never Smoker  . Smokeless tobacco: Former Network engineer and Sexual Activity  . Alcohol use: No    Alcohol/week: 0.0 standard drinks  . Drug use: No  . Sexual activity: Never    Birth control/protection: Post-menopausal  Lifestyle  . Physical activity:    Days per week: 0 days    Minutes per session: 0 min  . Stress: Only a little  Relationships  . Social connections:    Talks on phone: Not on file    Gets together: Not on file    Attends religious service: Never    Active member of club or organization: No    Attends meetings of clubs or organizations: Never    Relationship status: Divorced  Other Topics Concern  . Not on file  Social History Narrative  . Not on file    Allergies:  Allergies  Allergen Reactions  . Amoxicillin-Pot Clavulanate Nausea Only and Other (See Comments)    Diarrhea Has patient had a PCN reaction causing immediate rash, facial/tongue/throat swelling, SOB or lightheadedness with hypotension:  Has patient had a PCN reaction causing severe rash involving mucus membranes or skin necrosis: Has patient had a PCN reaction that required hospitalization:  Has patient had a PCN reaction occurring within the last 10 years: If all of the above answers are "NO", then may proceed with Cephalosporin use.   Marland Kitchen Oxycodone-Acetaminophen Nausea Only  . Erythromycin Nausea Only  . Azithromycin Nausea And Vomiting  . Codeine   . Macrolides And Ketolides   . Oxycodone-Acetaminophen   . Penicillins Other (See Comments)    Has patient had a PCN reaction causing immediate rash, facial/tongue/throat swelling, SOB or lightheadedness with hypotension:  NO Has patient had a PCN reaction causing severe rash involving mucus membranes or skin necrosis: NO Has patient had a PCN reaction that required hospitalization: NO Has patient had a PCN reaction occurring within the last 10 years: NO If all of the above answers are "NO", then may proceed with Cephalosporin use.   . Tramadol Nausea And Vomiting  . Lamotrigine Rash    LAMICTAL  . Sulfa Antibiotics Rash    Metabolic Disorder Labs: No results found for: HGBA1C, MPG No results found for: PROLACTIN No results found for: CHOL, TRIG, HDL, CHOLHDL, VLDL, LDLCALC No results found for: TSH  Therapeutic Level Labs: No results found for: LITHIUM Lab Results  Component Value Date   VALPROATE 59 05/13/2017  VALPROATE 58 02/22/2017   No components found for:  CBMZ  Current Medications: Current Outpatient Medications  Medication Sig Dispense Refill  . Abatacept (ORENCIA IV) Inject 1 Dose into the vein every 30 (thirty) days.    Marland Kitchen aspirin 81 MG chewable tablet Chew 81 mg by mouth daily with breakfast.    . Calcium Citrate-Vitamin D ([employer] CALCIUM CITRATE +D3 MINI) 200-250 MG-UNIT TABS Take 1 tablet by mouth daily after breakfast.     . carbamazepine (TEGRETOL XR) 200 MG 12 hr tablet TAKE 2 TABLETS BY MOUTH 2 TIMES DAILY 360 tablet 1  . cariprazine (VRAYLAR) capsule Take 1 capsule (3 mg total) by mouth daily. 30 capsule 1  . cetirizine (ZYRTEC) 10 MG tablet Take by mouth.    . divalproex (DEPAKOTE ER) 500 MG 24 hr tablet TAKE 1 TABLET BY MOUTH IN THE MORNING AND 1 TABLET AT NIGHT TIME 408 tablet 1  . folic acid (FOLVITE) 1 MG tablet Take 2 mg by mouth daily with breakfast.     . gabapentin (NEURONTIN) 300 MG capsule TAKE 1 CAPSULE BY MOUTH 2 TIMES DAILY 180 capsule 1  . hydrOXYzine (ATARAX/VISTARIL) 10 MG tablet TAKE 1 TO 2 TABLETS BY MOUTH AT BEDTIME AS NEEDED FOR SLEEP ISSUES 60 tablet 0  . levothyroxine (SYNTHROID, LEVOTHROID) 75 MCG tablet TAKE 1 TABLET BY MOUTH EACH DAY ON EMPTYSTOMACH  WITH A GLASS OF WATERAT LEAST 30 TO 60 MINUTES BEFORE BREAKFAST IN THE MORNING    . methotrexate 2.5 MG tablet Take 15 mg by mouth every Wednesday. WEDNESDAYS AT LUNCH    . moxifloxacin (VIGAMOX) 0.5 % ophthalmic solution Place 1 drop into the right eye 2 (two) times daily.    . Multiple Vitamins-Minerals (MULTIVITAMIN WITH MINERALS) tablet Take 1 tablet by mouth daily with breakfast.     . nitroGLYCERIN (NITROSTAT) 0.4 MG SL tablet Place 0.4 mg under the tongue every 5 (five) minutes as needed for chest pain.     Marland Kitchen ondansetron (ZOFRAN) 8 MG tablet TAKE 1 TABLET BY MOUTH EVERY 12 HOURS ASNEEDED FOR NAUSEA    . Polyethyl Glycol-Propyl Glycol (SYSTANE) 0.4-0.3 % GEL ophthalmic gel Place 1 application into both eyes 3 (three) times daily as needed (typically twice daily). Equate Brand     . pravastatin (PRAVACHOL) 10 MG tablet Take 10 mg by mouth at bedtime.     . ranitidine (ZANTAC) 150 MG tablet Take 150 mg by mouth 2 (two) times daily. BREAKFAST & BEDTIME.    Marland Kitchen Zoledronic Acid (RECLAST IV) Inject 1 Dose into the vein.    . eszopiclone (LUNESTA) 2 MG TABS tablet Take 1 tablet (2 mg total) by mouth at bedtime. Take immediately before bedtime 30 tablet 1   No current facility-administered medications for this visit.      Musculoskeletal: Strength & Muscle Tone: within normal limits Gait & Station: normal Patient leans: N/A  Psychiatric Specialty Exam: Review of Systems  Psychiatric/Behavioral: The patient has insomnia.   All other systems reviewed and are negative.   Blood pressure 134/86, pulse 68, temperature (!) 97.4 F (36.3 C), temperature source Oral, weight 112 lb (50.8 kg).Body mass index is 21.87 kg/m.  General Appearance: Casual  Eye Contact:  Fair  Speech:  Clear and Coherent  Volume:  Normal  Mood:  Anxious  Affect:  Appropriate  Thought Process:  Goal Directed and Descriptions of Associations: Intact  Orientation:  Full (Time, Place, and Person)  Thought Content:  Logical   Suicidal Thoughts:  No  Homicidal Thoughts:  No  Memory:  Immediate;   Fair Recent;   Fair Remote;   Fair  Judgement:  Fair  Insight:  Fair  Psychomotor Activity:  Normal  Concentration:  Concentration: Fair and Attention Span: Fair  Recall:  [employer] of Knowledge: Fair  Language: Fair  Akathisia:  No  Handed:  Right  AIMS (if indicated):denies tremors, rigidity, stiffness  Assets:  Desire for Improvement Social Support  ADL's:  Intact  Cognition: WNL  Sleep:  Poor   Screenings:   Assessment and Plan: Saylah is a 72 year old Caucasian female, divorced, lives in [place], has a history of bipolar disorder, presented to the clinic today for a follow-up visit.  Patient continues to have sleep problems which is not responding to the increased dosage of trazodone.  Discussed readjusting her medications as noted below.  Plan Bipolar disorder Continue Tegretol-XR as prescribed ,no changes made Continue Depakote ER 500 mg p.o. twice daily Continue gabapentin as prescribed. Continue Vraylar 3 mg p.o. daily in the a.m.  Insomnia Discontinue trazodone for lack of efficacy Start Lunesta 2 mg p.o. nightly Provided medication education about the risk of being on medications like Lunesta.  Also gave her printout information. Continue hydroxyzine 10-20 mg p.o. nightly as needed  Patient will continue psychotherapy with [name].  Follow-up in clinic in 1 week.  More than 50 % of the time was spent for psychoeducation and supportive psychotherapy and care coordination.  This note was generated in part or whole with voice recognition software. Voice recognition is usually quite accurate but there are transcription errors that can and very often do occur. I apologize for any typographical errors that were not detected and corrected.          Ursula Alert, MD 10/18/2017, 4:15 PM

## 2017-10-18 NOTE — Telephone Encounter (Signed)
sylvia at front desk states pt is up front and wants to be seen today .  pt was put on the schedule and see today.

## 2017-10-18 NOTE — Telephone Encounter (Signed)
pt called left another message that was 2 minutes long about she could not sleep she going to bed at 10 and then when she wakes up to go to bathroom she can not go back to sleep she going maybe 2 to 3 hours of sleep.

## 2017-10-18 NOTE — Telephone Encounter (Signed)
pt called left message that she is not able to sleep.

## 2017-10-19 ENCOUNTER — Telehealth: Payer: Self-pay

## 2017-10-19 MED ORDER — SUVOREXANT 10 MG PO TABS: 10.0000 mg | ORAL_TABLET | Freq: Every day | ORAL | 1 refills | Status: DC

## 2017-10-19 NOTE — Telephone Encounter (Signed)
Lunesta not approved per her health insurance plan.  Will sent Belsomra to her pharmacy for 30 days with 1 refill.

## 2017-10-19 NOTE — Telephone Encounter (Signed)
went online to [email or web address] and did the prior auth.  -  pending

## 2017-10-19 NOTE — Telephone Encounter (Signed)
received a fax this am stating that pa was denied.  pt will need to try and failed belsomra and rozerem before they can approve eszopiclone.

## 2017-10-19 NOTE — Telephone Encounter (Signed)
received a fax stating that a prior auth is needed for the eszopiclone 2mg .

## 2017-10-20 NOTE — Telephone Encounter (Signed)
Pt.notified

## 2017-10-22 ENCOUNTER — Telehealth: Payer: Self-pay

## 2017-10-22 DIAGNOSIS — G4700 Insomnia, unspecified: Secondary | ICD-10-CM

## 2017-10-22 MED ORDER — MIRTAZAPINE 15 MG PO TABS: 7.5000 mg | ORAL_TABLET | Freq: Every day | ORAL | 1 refills | Status: DC

## 2017-10-22 MED ORDER — SUVOREXANT 10 MG PO TABS: 10.0000 mg | ORAL_TABLET | Freq: Every day | ORAL | 1 refills | Status: DC

## 2017-10-22 NOTE — Telephone Encounter (Signed)
i called the pharmacy per the pharmacy the copay is $40 and pt does not want to pay $40 for medication. pt can not use coupons because of medicare.  is there anything cheaper that patient can try.

## 2017-10-22 NOTE — Telephone Encounter (Signed)
thanks

## 2017-10-22 NOTE — Telephone Encounter (Signed)
Script sent out on 10/19/2017 as per EHR. Will give a print out hard script today to be faxed to pharmacy.

## 2017-10-22 NOTE — Telephone Encounter (Signed)
pt came by office today she thought she had an appt to be seen today.  pt also state that the pharmacy did not have her belsomra.  pt states that the pharmacy claims they did not receive it.

## 2017-10-22 NOTE — Telephone Encounter (Signed)
I will sent Remeron 7.5 mg to pharmacy for sleep. Based on discussion with her while she was here , she has not been tried on it.

## 2017-10-22 NOTE — Telephone Encounter (Signed)
Pt.notified

## 2017-10-22 NOTE — Telephone Encounter (Signed)
pharmacy is still closed will call again at 9

## 2017-10-25 ENCOUNTER — Other Ambulatory Visit: Payer: Self-pay

## 2017-10-25 ENCOUNTER — Encounter: Payer: Self-pay | Admitting: Psychiatry

## 2017-10-25 ENCOUNTER — Ambulatory Visit (INDEPENDENT_AMBULATORY_CARE_PROVIDER_SITE_OTHER): Payer: Medicare Other | Admitting: Psychiatry

## 2017-10-25 VITALS — BP 147/89 | HR 73 | Temp 98.2°F | Wt 110.4 lb

## 2017-10-25 DIAGNOSIS — F3110 Bipolar disorder, current episode manic without psychotic features, unspecified: Secondary | ICD-10-CM | POA: Diagnosis not present

## 2017-10-25 DIAGNOSIS — F5105 Insomnia due to other mental disorder: Secondary | ICD-10-CM

## 2017-10-25 DIAGNOSIS — F411 Generalized anxiety disorder: Secondary | ICD-10-CM

## 2017-10-25 NOTE — Progress Notes (Signed)
[facility] MD OP Progress Note  10/25/2017 10:44 AM Stephanie [name]  MRN:  [id number]  Chief Complaint: ' I am here for follow up." Chief Complaint    Follow-up; Medication Refill     HPI: Stephanie [name] is a 72 yr old Caucasian female, divorced, lives in [place], presented to the clinic today for a follow-up visit.  Patient has a history of bipolar disorder, medical problems like coronary artery disease, Sjogren's syndrome, rheumatoid arthritis.  Patient presented along with her friend Lelon Frohlich.  Patient today reports she has started taking the Remeron since Friday night.  She reports she could not afford the Belsomra due to high co-pay.  She hence did not even try it.  She reports her sleep has been getting better since Friday.  She reports going to bed at around 9:15 PM.  She sometimes wakes up once at at night however she is able to fall back asleep.  She reports last night she slept 7-8 hours.  She reports last night as the best sleep she ever got.  Patient continues to tolerate Vraylar well.  Denies any side effects.  She denies any stiffness, rigidity, abnormal movements and so on.  Patient reports her manic episode as responding to the medication.  She reports her high energy is slowly getting better and stabilizing.  She denies any suicidality.  She denies any perceptual disturbances.  She denies any other concerns today.   Visit Diagnosis:    ICD-10-CM   1. Bipolar I disorder, most recent episode (or current) manic ([place]) F31.10   2. GAD (generalized anxiety disorder) F41.1   3. Insomnia due to mental disorder F51.05     Past Psychiatric History: Reviewed past psychiatric history from my progress note on 06/17/2017  Past Medical History:  Past Medical History:  Diagnosis Date  . Anginal pain ([place])   . Anxiety   . Bipolar 1 disorder ([place])   . Bipolar disorder ([place])   . Coronary artery disease   . Depression   . Fatigue   . Fibromyalgia   . GERD (gastroesophageal reflux disease)   . Heart  disease   . Heart failure ([place])   . HLD (hyperlipidemia)   . HOH (hard of hearing)   . Hypothyroidism   . IBS (irritable bowel syndrome)   . Pelvic pain in female   . Perianal lesion    high grade sil lesion- keratinizing type  . RA (rheumatoid arthritis) ([place])   . Raynaud phenomenon   . Sjogren's disease ([place])   . Sleep apnea   . Vaginal Pap smear, abnormal      Past Surgical History:  Procedure Laterality Date  . CATARACT EXTRACTION W/PHACO Left 02/23/2017   Procedure: CATARACT EXTRACTION PHACO AND INTRAOCULAR LENS PLACEMENT (IOC);  Surgeon: Birder Robson, MD;  Location: [facility] ORS;  Service: Ophthalmology;  Laterality: Left;  [place] 00:35 AP% 9.6 CDE 3.47 Fluid pack lot # [id number]  . CATARACT EXTRACTION W/PHACO Right 03/30/2017   Procedure: CATARACT EXTRACTION PHACO AND INTRAOCULAR LENS PLACEMENT ([place]);  Surgeon: Birder Robson, MD;  Location: [facility] ORS;  Service: Ophthalmology;  Laterality: Right;  [place] 00:32.6 AP% 13.8 CDE 4.51 Fluid Pack Lot # [id number]  . EYE SURGERY    . TONSILLECTOMY      Family Psychiatric History: I have reviewed family psychiatric history from my progress note on 06/17/2017.  Family History:  Family History  Problem Relation Age of Onset  . Heart failure Father   . Parkinson's disease Mother   . Bipolar  disorder Mother   . Depression Sister   . Schizophrenia Paternal Grandmother   . Diabetes Maternal Grandmother   . Pancreatic cancer Maternal Grandmother   . Cancer Neg Hx   . Heart disease Neg Hx    Substance abuse history: Denies   Social History: I have reviewed social history from my progress note on 06/17/2017. Social History   Socioeconomic History  . Marital status: Divorced    Spouse name: Not on file  . Number of children: 2  . Years of education: Not on file  . Highest education level: Master's degree (e.g., MA, MS, MEng, MEd, MSW, MBA)  Occupational History    Comment: retired2  Social Needs  . Financial resource strain:  Somewhat hard  . Food insecurity:    Worry: Never true    Inability: Never true  . Transportation needs:    Medical: No    Non-medical: No  Tobacco Use  . Smoking status: Never Smoker  . Smokeless tobacco: Former Network engineer and Sexual Activity  . Alcohol use: No    Alcohol/week: 0.0 standard drinks  . Drug use: No  . Sexual activity: Never    Birth control/protection: Post-menopausal  Lifestyle  . Physical activity:    Days per week: 0 days    Minutes per session: 0 min  . Stress: Only a little  Relationships  . Social connections:    Talks on phone: Not on file    Gets together: Not on file    Attends religious service: Never    Active member of club or organization: No    Attends meetings of clubs or organizations: Never    Relationship status: Divorced  Other Topics Concern  . Not on file  Social History Narrative  . Not on file    Allergies:  Allergies  Allergen Reactions  . Amoxicillin-Pot Clavulanate Nausea Only and Other (See Comments)    Diarrhea Has patient had a PCN reaction causing immediate rash, facial/tongue/throat swelling, SOB or lightheadedness with hypotension:  Has patient had a PCN reaction causing severe rash involving mucus membranes or skin necrosis: Has patient had a PCN reaction that required hospitalization:  Has patient had a PCN reaction occurring within the last 10 years: If all of the above answers are "NO", then may proceed with Cephalosporin use.   Marland Kitchen Oxycodone-Acetaminophen Nausea Only  . Erythromycin Nausea Only  . Azithromycin Nausea And Vomiting  . Codeine   . Macrolides And Ketolides   . Oxycodone-Acetaminophen   . Penicillins Other (See Comments)    Has patient had a PCN reaction causing immediate rash, facial/tongue/throat swelling, SOB or lightheadedness with hypotension: NO Has patient had a PCN reaction causing severe rash involving mucus membranes or skin necrosis: NO Has patient had a PCN reaction that required  hospitalization: NO Has patient had a PCN reaction occurring within the last 10 years: NO If all of the above answers are "NO", then may proceed with Cephalosporin use.   . Tramadol Nausea And Vomiting  . Lamotrigine Rash    LAMICTAL  . Sulfa Antibiotics Rash    Metabolic Disorder Labs: No results found for: HGBA1C, MPG No results found for: PROLACTIN No results found for: CHOL, TRIG, HDL, CHOLHDL, VLDL, LDLCALC No results found for: TSH  Therapeutic Level Labs: No results found for: LITHIUM Lab Results  Component Value Date   VALPROATE 59 05/13/2017   VALPROATE 58 02/22/2017   No components found for:  CBMZ  Current Medications: Current Outpatient Medications  Medication Sig Dispense Refill  . Abatacept (ORENCIA IV) Inject 1 Dose into the vein every 30 (thirty) days.    Marland Kitchen aspirin 81 MG chewable tablet Chew 81 mg by mouth daily with breakfast.    . Calcium Citrate-Vitamin D ([employer] CALCIUM CITRATE +D3 MINI) 200-250 MG-UNIT TABS Take 1 tablet by mouth daily after breakfast.     . carbamazepine (TEGRETOL XR) 200 MG 12 hr tablet TAKE 2 TABLETS BY MOUTH 2 TIMES DAILY 360 tablet 1  . cariprazine (VRAYLAR) capsule Take 1 capsule (3 mg total) by mouth daily. 30 capsule 1  . cetirizine (ZYRTEC) 10 MG tablet Take by mouth.    . divalproex (DEPAKOTE ER) 500 MG 24 hr tablet TAKE 1 TABLET BY MOUTH IN THE MORNING AND 1 TABLET AT NIGHT TIME 563 tablet 1  . folic acid (FOLVITE) 1 MG tablet Take 2 mg by mouth daily with breakfast.     . gabapentin (NEURONTIN) 300 MG capsule TAKE 1 CAPSULE BY MOUTH 2 TIMES DAILY 180 capsule 1  . hydrOXYzine (ATARAX/VISTARIL) 10 MG tablet TAKE 1 TO 2 TABLETS BY MOUTH AT BEDTIME AS NEEDED FOR SLEEP ISSUES 60 tablet 0  . levothyroxine (SYNTHROID, LEVOTHROID) 75 MCG tablet TAKE 1 TABLET BY MOUTH EACH DAY ON EMPTYSTOMACH WITH A GLASS OF WATERAT LEAST 30 TO 60 MINUTES BEFORE BREAKFAST IN THE MORNING    . methotrexate 2.5 MG tablet Take 15 mg by mouth every Wednesday.  WEDNESDAYS AT LUNCH    . mirtazapine (REMERON) 15 MG tablet Take 0.5 tablets (7.5 mg total) by mouth at bedtime. 15 tablet 1  . moxifloxacin (VIGAMOX) 0.5 % ophthalmic solution Place 1 drop into the right eye 2 (two) times daily.    . Multiple Vitamins-Minerals (MULTIVITAMIN WITH MINERALS) tablet Take 1 tablet by mouth daily with breakfast.     . nitroGLYCERIN (NITROSTAT) 0.4 MG SL tablet Place 0.4 mg under the tongue every 5 (five) minutes as needed for chest pain.     Marland Kitchen ondansetron (ZOFRAN) 8 MG tablet TAKE 1 TABLET BY MOUTH EVERY 12 HOURS ASNEEDED FOR NAUSEA    . Polyethyl Glycol-Propyl Glycol (SYSTANE) 0.4-0.3 % GEL ophthalmic gel Place 1 application into both eyes 3 (three) times daily as needed (typically twice daily). Equate Brand     . pravastatin (PRAVACHOL) 10 MG tablet Take 10 mg by mouth at bedtime.     . ranitidine (ZANTAC) 150 MG tablet Take 150 mg by mouth 2 (two) times daily. BREAKFAST & BEDTIME.    Marland Kitchen Zoledronic Acid (RECLAST IV) Inject 1 Dose into the vein.     No current facility-administered medications for this visit.      Musculoskeletal: Strength & Muscle Tone: within normal limits Gait & Station: normal Patient leans: N/A  Psychiatric Specialty Exam: Review of Systems  Psychiatric/Behavioral: The patient is nervous/anxious and has insomnia (improving).   All other systems reviewed and are negative.   Blood pressure (!) 147/89, pulse 73, temperature 98.2 F (36.8 C), temperature source Oral, weight 110 lb 6.4 oz (50.1 kg).Body mass index is 21.56 kg/m.  General Appearance: Casual  Eye Contact:  Fair  Speech:  Normal Rate  Volume:  Normal  Mood:  Anxious  Affect:  Congruent  Thought Process:  Goal Directed and Descriptions of Associations: Intact  Orientation:  Full (Time, Place, and Person)  Thought Content: Logical   Suicidal Thoughts:  No  Homicidal Thoughts:  No  Memory:  Immediate;   Fair Recent;   Fair Remote;   Fair  Judgement:  Fair  Insight:   Fair  Psychomotor Activity:  Normal  Concentration:  Concentration: Fair and Attention Span: Fair  Recall:  [employer] of Knowledge: Fair  Language: Fair  Akathisia:  No  Handed:  Right  AIMS (if indicated): 0  Assets:  Communication Skills Desire for Improvement Housing Social Support  ADL's:  Intact  Cognition: WNL  Sleep:  improving   Screenings:AIMS    Assessment and Plan:Denay is a 72 yr old Caucasian female, divorced, lives in [place], has a history of bipolar disorder, presented to the clinic today for a follow-up visit.  Patient reports she started Remeron 3 nights ago.  She reports sleep is improving and denies side effects.  Will continue plan as noted below.  Plan  Bipolar disorder Continue Tegretol extended release as prescribed, no changes made. Continue Depakote ER 500 mg p.o. twice daily. Continue gabapentin as prescribed. Continue Vraylar 3 mg p.o. daily in the a.m.   Insomnia Discontinue Lunesta, Belsomra-patient could not afford it. Started Remeron 7.5 mg p.o. nightly. Continue hydroxyzine 10-20 mg p.o. nightly as needed. Also discussed mindfulness, relaxation techniques.  Patient will continue psychotherapy with [name].  Follow-up in clinic in 2 weeks.  More than 50 % of the time was spent for psychoeducation and supportive psychotherapy and care coordination.  This note was generated in part or whole with voice recognition software. Voice recognition is usually quite accurate but there are transcription errors that can and very often do occur. I apologize for any typographical errors that were not detected and corrected.     Ursula Alert, MD 10/25/2017, 10:44 AM

## 2017-11-11 ENCOUNTER — Other Ambulatory Visit: Payer: Self-pay

## 2017-11-11 ENCOUNTER — Encounter: Payer: Self-pay | Admitting: Psychiatry

## 2017-11-11 ENCOUNTER — Other Ambulatory Visit: Payer: Self-pay | Admitting: Psychiatry

## 2017-11-11 ENCOUNTER — Ambulatory Visit: Payer: Medicare Other | Admitting: Psychiatry

## 2017-11-11 VITALS — BP 136/81 | HR 76 | Temp 97.9°F | Wt 111.2 lb

## 2017-11-11 DIAGNOSIS — F3110 Bipolar disorder, current episode manic without psychotic features, unspecified: Secondary | ICD-10-CM

## 2017-11-11 DIAGNOSIS — F411 Generalized anxiety disorder: Secondary | ICD-10-CM

## 2017-11-11 DIAGNOSIS — F5105 Insomnia due to other mental disorder: Secondary | ICD-10-CM

## 2017-11-11 MED ORDER — CARIPRAZINE HCL 3 MG PO CAPS: 3.0000 mg | ORAL_CAPSULE | Freq: Every day | ORAL | 3 refills | Status: DC

## 2017-11-11 MED ORDER — MIRTAZAPINE 15 MG PO TABS: 7.5000 mg | ORAL_TABLET | Freq: Every day | ORAL | 1 refills | Status: DC

## 2017-11-11 MED ORDER — HYDROXYZINE HCL 10 MG PO TABS: 10.0000 mg | ORAL_TABLET | Freq: Every evening | ORAL | 1 refills | Status: DC | PRN

## 2017-11-11 NOTE — Patient Instructions (Signed)
Benztropine tablets What is this medicine? BENZTROPINE (BENZ troe peen) is for certain movement problems due to Parkinson's disease, certain medicines, or other causes. This medicine may be used for other purposes; ask your health care provider or pharmacist if you have questions. COMMON BRAND NAME(S): Cogentin What should I tell my health care provider before I take this medicine? They need to know if you have any of these conditions: -glaucoma -heart disease or a rapid heartbeat -mental problems -prostate trouble -tardive dyskinesia -an unusual or allergic reaction to benztropine, other medicines, lactose, foods, dyes, or preservatives -pregnant or trying to get pregnant -breast-feeding How should I use this medicine? Take this medicine by mouth with a full glass of water. Follow the directions on the prescription label. Take your medicine at regular intervals. Do not take your medicine more often than directed. Talk to your pediatrician regarding the use of this medicine in children. While this drug may be prescribed for children as young as 3 years for selected conditions, precautions do apply. Overdosage: If you think you have taken too much of this medicine contact a poison control center or emergency room at once. NOTE: This medicine is only for you. Do not share this medicine with others. What if I miss a dose? If you miss a dose, take it as soon as you can. If it is almost time for your next dose, take only that dose. Do not take double or extra doses. What may interact with this medicine? -haloperidol -medicines for movement abnormalities like Parkinson's disease -phenothiazines like chlorpromazine, mesoridazine, prochlorperazine, thioridazine -some antidepressants like amitriptyline, desipramine, doxepin, nortriptyline -stimulant medicines for attention, weight loss, and to stay awake -tegaserod This list may not describe all possible interactions. Give your health care  provider a list of all the medicines, herbs, non-prescription drugs, or dietary supplements you use. Also tell them if you smoke, drink alcohol, or use illegal drugs. Some items may interact with your medicine. What should I watch for while using this medicine? Visit your doctor or health care professional for regular checks on your progress. You may get drowsy or dizzy. Do not drive, use machinery, or do anything that needs mental alertness until you know how this medicine affects you. Do not stand or sit up quickly, especially if you are an older patient. This reduces the risk of dizzy or fainting spells. Alcohol may interfere with the effect of this medicine. Avoid alcoholic drinks. Your mouth may get dry. Chewing sugarless gum or sucking hard candy, and drinking plenty of water may help. Contact your doctor if the problem does not go away or is severe. This medicine may cause dry eyes and blurred vision. If you wear contact lenses you may feel some discomfort. Lubricating drops may help. See your eye doctor if the problem does not go away or is severe. You may sweat less than usual while you are taking this medicine. As a result your body temperature could rise to a dangerous level. Be careful not to get overheated during exercise or in hot weather. You could get heat stroke. Avoid taking hot baths and using hot tubs and saunas. What side effects may I notice from receiving this medicine? Side effects that you should report to your doctor or health care professional as soon as possible: -allergic reactions like skin rash, itching or hives, swelling of the face, lips, or tongue -changes in vision -confusion -decreased sweating or heat intolerance -depression -fast, irregular heartbeat -hallucinations -memory loss -muscle weakness -pain or difficulty  passing urine -vomiting Side effects that usually do not require medical attention (report to your doctor or health care professional if they  continue or are bothersome): -constipation -dry mouth -nausea This list may not describe all possible side effects. Call your doctor for medical advice about side effects. You may report side effects to FDA at 1-800-FDA-1088. Where should I keep my medicine? Keep out of the reach of children. Store below 30 degrees C (86 degrees F). Keep container tightly closed. Throw away any unused medicine after the expiration date. NOTE: This sheet is a summary. It may not cover all possible information. If you have questions about this medicine, talk to your doctor, pharmacist, or health care provider.  2018 Elsevier/Gold Standard (2007-05-25 15:38:20)

## 2017-11-11 NOTE — Progress Notes (Signed)
[place] MD  OP Progress Note  11/11/2017 5:34 PM GHAZAL PEVEY  MRN:  [id number]  Chief Complaint: ' I am here for follow up." Chief Complaint    Follow-up; Medication Refill     HPI: Niva is a 72 year old Caucasian female, divorced, lives in [place], presented to the clinic today for a follow-up visit.  Patient has a history of bipolar disorder, coronary artery disease, Sjogren's syndrome, rheumatoid arthritis and so on.  Patient presented along with her friend Lelon Frohlich.  Patient today reports her manic symptoms are improving on the current medication regimen.  She continues to have some restlessness on and off and is hyperverbal today however that is her baseline.  Patient reports sleep is improved.  She reports she now takes Vraylar at night and its not making her restless at night anymore.  She reports the combination of mirtazapine, hydroxyzine and Vraylar as helpful with her sleep at night.  Patient denies any suicidality.  Patient denies any perceptual disturbances.  She reports she continues to stay active.  She denies any other concerns today.  Discussed getting her Depakote level and Tegretol level drawn, provided her with lab slip.  Also discussed getting a metabolic panel done since she is on Vraylar.    Visit Diagnosis:    ICD-10-CM   1. Bipolar I disorder, most recent episode (or current) manic (HCC) F31.10 hydrOXYzine (ATARAX/VISTARIL) 10 MG tablet  2. GAD (generalized anxiety disorder) F41.1   3. Insomnia due to mental disorder F51.05 mirtazapine (REMERON) 15 MG tablet    Past Psychiatric History: Have reviewed past psychiatric history from my progress note on 06/17/2017.  Past Medical History:  Past Medical History:  Diagnosis Date  . Anginal pain ([place])   . Anxiety   . Bipolar 1 disorder ([place])   . Bipolar disorder ([place])   . Coronary artery disease   . Depression   . Fatigue   . Fibromyalgia   . GERD (gastroesophageal reflux disease)   . Heart disease   . Heart  failure ([place])   . HLD (hyperlipidemia)   . HOH (hard of hearing)   . Hypothyroidism   . IBS (irritable bowel syndrome)   . Pelvic pain in female   . Perianal lesion    high grade sil lesion- keratinizing type  . RA (rheumatoid arthritis) ([place])   . Raynaud phenomenon   . Sjogren's disease ([place])   . Sleep apnea   . Vaginal Pap smear, abnormal     Past Surgical History:  Procedure Laterality Date  . CATARACT EXTRACTION W/PHACO Left 02/23/2017   Procedure: CATARACT EXTRACTION PHACO AND INTRAOCULAR LENS PLACEMENT (IOC);  Surgeon: Birder Robson, MD;  Location: [facility] ORS;  Service: Ophthalmology;  Laterality: Left;  [place] 00:35 AP% 9.6 CDE 3.47 Fluid pack lot # [id number]  . CATARACT EXTRACTION W/PHACO Right 03/30/2017   Procedure: CATARACT EXTRACTION PHACO AND INTRAOCULAR LENS PLACEMENT ([place]);  Surgeon: Birder Robson, MD;  Location: [facility] ORS;  Service: Ophthalmology;  Laterality: Right;  [place] 00:32.6 AP% 13.8 CDE 4.51 Fluid Pack Lot # [id number]  . EYE SURGERY    . TONSILLECTOMY      Family Psychiatric History: Reviewed family psychiatric history from my progress note on 06/17/2017.  Family History:  Family History  Problem Relation Age of Onset  . Heart failure Father   . Parkinson's disease Mother   . Bipolar disorder Mother   . Depression Sister   . Schizophrenia Paternal Grandmother   . Diabetes Maternal Grandmother   . Pancreatic  cancer Maternal Grandmother   . Cancer Neg Hx   . Heart disease Neg Hx     Social History: Reviewed social history from my progress note on 06/17/2017 Social History   Socioeconomic History  . Marital status: Divorced    Spouse name: Not on file  . Number of children: 2  . Years of education: Not on file  . Highest education level: Master's degree (e.g., MA, MS, MEng, MEd, MSW, MBA)  Occupational History    Comment: retired2  Social Needs  . Financial resource strain: Somewhat hard  . Food insecurity:    Worry: Never true    Inability:  Never true  . Transportation needs:    Medical: No    Non-medical: No  Tobacco Use  . Smoking status: Never Smoker  . Smokeless tobacco: Former Network engineer and Sexual Activity  . Alcohol use: No    Alcohol/week: 0.0 standard drinks  . Drug use: No  . Sexual activity: Never    Birth control/protection: Post-menopausal  Lifestyle  . Physical activity:    Days per week: 0 days    Minutes per session: 0 min  . Stress: Only a little  Relationships  . Social connections:    Talks on phone: Not on file    Gets together: Not on file    Attends religious service: Never    Active member of club or organization: No    Attends meetings of clubs or organizations: Never    Relationship status: Divorced  Other Topics Concern  . Not on file  Social History Narrative  . Not on file    Allergies:  Allergies  Allergen Reactions  . Amoxicillin-Pot Clavulanate Nausea Only and Other (See Comments)    Diarrhea Has patient had a PCN reaction causing immediate rash, facial/tongue/throat swelling, SOB or lightheadedness with hypotension:  Has patient had a PCN reaction causing severe rash involving mucus membranes or skin necrosis: Has patient had a PCN reaction that required hospitalization:  Has patient had a PCN reaction occurring within the last 10 years: If all of the above answers are "NO", then may proceed with Cephalosporin use.   Marland Kitchen Oxycodone-Acetaminophen Nausea Only  . Erythromycin Nausea Only  . Azithromycin Nausea And Vomiting  . Codeine   . Macrolides And Ketolides   . Oxycodone-Acetaminophen   . Penicillins Other (See Comments)    Has patient had a PCN reaction causing immediate rash, facial/tongue/throat swelling, SOB or lightheadedness with hypotension: NO Has patient had a PCN reaction causing severe rash involving mucus membranes or skin necrosis: NO Has patient had a PCN reaction that required hospitalization: NO Has patient had a PCN reaction occurring within the  last 10 years: NO If all of the above answers are "NO", then may proceed with Cephalosporin use.   . Tramadol Nausea And Vomiting  . Lamotrigine Rash    LAMICTAL  . Sulfa Antibiotics Rash    Metabolic Disorder Labs: No results found for: HGBA1C, MPG No results found for: PROLACTIN No results found for: CHOL, TRIG, HDL, CHOLHDL, VLDL, LDLCALC No results found for: TSH  Therapeutic Level Labs: No results found for: LITHIUM Lab Results  Component Value Date   VALPROATE 59 05/13/2017   VALPROATE 58 02/22/2017   No components found for:  CBMZ  Current Medications: Current Outpatient Medications  Medication Sig Dispense Refill  . Abatacept (ORENCIA IV) Inject 1 Dose into the vein every 30 (thirty) days.    Marland Kitchen aspirin 81 MG chewable tablet Chew  81 mg by mouth daily with breakfast.    . Calcium Citrate-Vitamin D ([employer] CALCIUM CITRATE +D3 MINI) 200-250 MG-UNIT TABS Take 1 tablet by mouth daily after breakfast.     . carbamazepine (TEGRETOL XR) 200 MG 12 hr tablet TAKE 2 TABLETS BY MOUTH 2 TIMES DAILY 360 tablet 1  . cariprazine (VRAYLAR) capsule Take 1 capsule (3 mg total) by mouth daily. 30 capsule 3  . cetirizine (ZYRTEC) 10 MG tablet Take by mouth.    . divalproex (DEPAKOTE ER) 500 MG 24 hr tablet TAKE 1 TABLET BY MOUTH IN THE MORNING AND 1 TABLET AT NIGHT TIME 742 tablet 1  . folic acid (FOLVITE) 1 MG tablet Take 2 mg by mouth daily with breakfast.     . gabapentin (NEURONTIN) 300 MG capsule TAKE 1 CAPSULE BY MOUTH 2 TIMES DAILY 180 capsule 1  . hydrOXYzine (ATARAX/VISTARIL) 10 MG tablet Take 1-2 tablets (10-20 mg total) by mouth at bedtime as needed (FOR SLEEP). 180 tablet 1  . levothyroxine (SYNTHROID, LEVOTHROID) 75 MCG tablet TAKE 1 TABLET BY MOUTH EACH DAY ON EMPTYSTOMACH WITH A GLASS OF WATERAT LEAST 30 TO 60 MINUTES BEFORE BREAKFAST IN THE MORNING    . methotrexate 2.5 MG tablet Take 15 mg by mouth every Wednesday. WEDNESDAYS AT LUNCH    . mirtazapine (REMERON) 15 MG tablet  Take 0.5 tablets (7.5 mg total) by mouth at bedtime. 45 tablet 1  . moxifloxacin (VIGAMOX) 0.5 % ophthalmic solution Place 1 drop into the right eye 2 (two) times daily.    . Multiple Vitamins-Minerals (MULTIVITAMIN WITH MINERALS) tablet Take 1 tablet by mouth daily with breakfast.     . nitroGLYCERIN (NITROSTAT) 0.4 MG SL tablet Place 0.4 mg under the tongue every 5 (five) minutes as needed for chest pain.     Marland Kitchen ondansetron (ZOFRAN) 8 MG tablet TAKE 1 TABLET BY MOUTH EVERY 12 HOURS ASNEEDED FOR NAUSEA    . Polyethyl Glycol-Propyl Glycol (SYSTANE) 0.4-0.3 % GEL ophthalmic gel Place 1 application into both eyes 3 (three) times daily as needed (typically twice daily). Equate Brand     . pravastatin (PRAVACHOL) 10 MG tablet Take 10 mg by mouth at bedtime.     . ranitidine (ZANTAC) 150 MG tablet Take 150 mg by mouth 2 (two) times daily. BREAKFAST & BEDTIME.    Marland Kitchen Zoledronic Acid (RECLAST IV) Inject 1 Dose into the vein.     No current facility-administered medications for this visit.      Musculoskeletal: Strength & Muscle Tone: within normal limits Gait & Station: walks with a cane Patient leans: N/A  Psychiatric Specialty Exam: Review of Systems  Psychiatric/Behavioral: The patient is nervous/anxious.   All other systems reviewed and are negative.   Blood pressure 136/81, pulse 76, temperature 97.9 F (36.6 C), temperature source Oral, weight 111 lb 3.2 oz (50.4 kg).Body mass index is 21.72 kg/m.  General Appearance: Casual  Eye Contact:  Fair  Speech:  Clear and Coherent  Volume:  Normal  Mood:  Anxious improving  Affect:  Congruent  Thought Process:  Goal Directed and Descriptions of Associations: Intact  Orientation:  Full (Time, Place, and Person)  Thought Content: Logical   Suicidal Thoughts:  No  Homicidal Thoughts:  No  Memory:  Immediate;   Fair Recent;   Fair Remote;   Fair  Judgement:  Fair  Insight:  Fair  Psychomotor Activity:  Normal  Concentration:   Concentration: Fair and Attention Span: Fair  Recall:  [employer]  of Knowledge: Fair  Language: Fair  Akathisia:  No  Handed:  Right  AIMS (if indicated): 0  Assets:  Communication Skills Desire for Improvement Social Support  ADL's:  Intact  Cognition: WNL  Sleep:  improving   Screenings: AIMS     Office Visit from 11/11/2017 in [facility] Total Score  0       Assessment and Plan: Lyrick is a 72 year old Caucasian female, divorced, lives in [place], has a history of bipolar disorder, presented to the clinic today for a follow-up visit.  Patient reports improvement in her manic symptoms.  She is also sleeping better.  Will continue plan as noted below.  Plan Bipolar disorder Continue Tegretol extended release 400 mg p.o. twice daily. Tegretol level on 05/13/2017-therapeutic at 8.3 mcg/mL Continue Depakote extended release 500 mg p.o. twice daily Depakote level on 05/13/2017-59 mcg/mL-therapeutic Continue gabapentin 300 mg po bid. Continue Vraylar 3 mg po daily.  Insomnia Continue Remeron 7.5 mg p.o. nightly Hydroxyzine 10-20 mg p.o. nightly as needed, also discussed mindfulness and relaxation techniques.  We will get the following labs-Depakote level, Tegretol level, lipid panel, hemoglobin A1c, CBC with differential, prolactin, CMP.  Patient will continue psychotherapy with [name].  Follow-up in clinic in 6 weeks or sooner if needed.  More than 50 % of the time was spent for psychoeducation and supportive psychotherapy and care coordination.  This note was generated in part or whole with voice recognition software. Voice recognition is usually quite accurate but there are transcription errors that can and very often do occur. I apologize for any typographical errors that were not detected and corrected.        Ursula Alert, MD 11/11/2017, 5:34 PM

## 2017-11-22 ENCOUNTER — Other Ambulatory Visit: Payer: Self-pay | Admitting: Psychiatry

## 2017-11-23 LAB — CBC WITH DIFFERENTIAL/PLATELET
BASOS ABS: 0 10*3/uL (ref 0.0–0.2)
BASOS: 0 %
EOS (ABSOLUTE): 0.3 10*3/uL (ref 0.0–0.4)
Eos: 7 %
HEMOGLOBIN: 14.3 g/dL (ref 11.1–15.9)
Hematocrit: 42.9 % (ref 34.0–46.6)
Immature Grans (Abs): 0 10*3/uL (ref 0.0–0.1)
Immature Granulocytes: 0 %
LYMPHS ABS: 2.5 10*3/uL (ref 0.7–3.1)
Lymphs: 48 %
MCH: 34.4 pg — ABNORMAL HIGH (ref 26.6–33.0)
MCHC: 33.3 g/dL (ref 31.5–35.7)
MCV: 103 fL — AB (ref 79–97)
Monocytes Absolute: 0.4 10*3/uL (ref 0.1–0.9)
Monocytes: 8 %
NEUTROS PCT: 37 %
Neutrophils Absolute: 1.9 10*3/uL (ref 1.4–7.0)
PLATELETS: 180 10*3/uL (ref 150–450)
RBC: 4.16 x10E6/uL (ref 3.77–5.28)
RDW: 13.8 % (ref 12.3–15.4)
WBC: 5.1 10*3/uL (ref 3.4–10.8)

## 2017-11-23 LAB — COMPREHENSIVE METABOLIC PANEL
A/G RATIO: 2 (ref 1.2–2.2)
ALBUMIN: 4.4 g/dL (ref 3.5–4.8)
ALK PHOS: 50 IU/L (ref 39–117)
ALT: 14 IU/L (ref 0–32)
AST: 24 IU/L (ref 0–40)
BILIRUBIN TOTAL: 0.3 mg/dL (ref 0.0–1.2)
BUN / CREAT RATIO: 34 — AB (ref 12–28)
BUN: 19 mg/dL (ref 8–27)
CO2: 22 mmol/L (ref 20–29)
Calcium: 9.4 mg/dL (ref 8.7–10.3)
Chloride: 103 mmol/L (ref 96–106)
Creatinine, Ser: 0.56 mg/dL — ABNORMAL LOW (ref 0.57–1.00)
GFR calc non Af Amer: 93 mL/min/{1.73_m2} (ref >59)
GFR, EST AFRICAN AMERICAN: 108 mL/min/{1.73_m2} (ref >59)
GLUCOSE: 94 mg/dL (ref 65–99)
Globulin, Total: 2.2 g/dL (ref 1.5–4.5)
POTASSIUM: 4.5 mmol/L (ref 3.5–5.2)
Sodium: 141 mmol/L (ref 134–144)
TOTAL PROTEIN: 6.6 g/dL (ref 6.0–8.5)

## 2017-11-23 LAB — HGB A1C W/O EAG: Hgb A1c MFr Bld: 5.3 % (ref 4.8–5.6)

## 2017-11-23 LAB — PROLACTIN: Prolactin: 33 ng/mL — ABNORMAL HIGH (ref 4.8–23.3)

## 2017-11-23 LAB — LIPID PANEL W/O CHOL/HDL RATIO
Cholesterol, Total: 219 mg/dL — ABNORMAL HIGH (ref 100–199)
HDL: 79 mg/dL (ref >39)
LDL CALC: 119 mg/dL — AB (ref 0–99)
Triglycerides: 105 mg/dL (ref 0–149)
VLDL CHOLESTEROL CAL: 21 mg/dL (ref 5–40)

## 2017-11-23 LAB — VALPROIC ACID LEVEL: Valproic Acid Lvl: 63 ug/mL (ref 50–100)

## 2017-11-23 LAB — CARBAMAZEPINE LEVEL, TOTAL: CARBAMAZEPINE LVL: 8.6 ug/mL (ref 4.0–12.0)

## 2017-12-03 ENCOUNTER — Telehealth: Payer: Self-pay

## 2017-12-03 NOTE — Telephone Encounter (Signed)
faxed and confirmed copy of labwork sent to pt pcp

## 2017-12-03 NOTE — Telephone Encounter (Signed)
Thanks jess

## 2017-12-03 NOTE — Telephone Encounter (Signed)
left message about labwork and that a copy would be mailed to patient today and that her pcp was faxed a copy of labwork results.

## 2017-12-22 ENCOUNTER — Telehealth: Payer: Self-pay

## 2017-12-22 NOTE — Telephone Encounter (Signed)
she too sleepy she is only taking 1 tablet instead of 2 .

## 2017-12-23 ENCOUNTER — Ambulatory Visit: Payer: Medicare Other | Admitting: Psychiatry

## 2017-12-23 ENCOUNTER — Encounter: Payer: Self-pay | Admitting: Psychiatry

## 2017-12-23 VITALS — BP 136/86 | HR 76 | Ht 60.0 in | Wt 114.0 lb

## 2017-12-23 DIAGNOSIS — F3173 Bipolar disorder, in partial remission, most recent episode manic: Secondary | ICD-10-CM | POA: Diagnosis not present

## 2017-12-23 DIAGNOSIS — F411 Generalized anxiety disorder: Secondary | ICD-10-CM | POA: Diagnosis not present

## 2017-12-23 DIAGNOSIS — F5105 Insomnia due to other mental disorder: Secondary | ICD-10-CM | POA: Diagnosis not present

## 2017-12-23 NOTE — Telephone Encounter (Signed)
Pt seen today in clinic

## 2017-12-23 NOTE — Progress Notes (Signed)
[place] MD OP Progress Note  12/23/2017 12:53 PM Stephanie [name]  MRN:  [id number]  Chief Complaint: ' I am here for follow up." Chief Complaint    Follow-up     HPI: Stephanie [name] is a 72 year old Caucasian female, divorced, lives in [place], presented to the clinic today for a follow-up visit.  Patient has a history of bipolar disorder, coronary artery disease, Sjogren's syndrome, rheumatoid arthritis.  Patient today presented along with her friend Stephanie [name].  Patient today reports the medications as keeping her mood symptoms stable.  She continues to be compliant with her medications.  She however reports she may be sleeping excessively during the day.  She reports she has noticed that she is dozing off while at church.  She reports church is very important for her and hence wonders if it is due to her medications which could be causing excessive sleep during the day.  She however reports she continues to stay active every day of the week.  She has been going to the gym to work out several days a week and also has started swimming 2-3 days a week to keep herself active.  She however reports in spite of doing that she may have gained a few pounds since the past few months.  She is worried it could be due to her medications.  Discussed with patient to change the dosage of mirtazapine to 15 mg to see if that will make any difference in her excessive sleepiness during the day.  Discussed with patient mirtazapine at lower doses are more sedative .  Also discussed with patient again to stop using hydroxyzine since that could also add to her sleepiness.  This was discussed with her previously however she continues to take it daily.  Discussed with patient that there is a concern for weight changes with her medications.  Her medications like mirtazapine, Depakote,vraylar -can cause weight changes.  Discussed with patient that if she continues to do well she can be tapered down of some of the medications to see if that will  help.  Discussed with patient to continue to stay active in the meantime.  Discussed her most recent labs with patient.  Her valproic acid as well as her carbamazepine levels are therapeutic.  However discussed with her that her prolactin level is slightly increased which may be due to her Vraylar.  But since she does not have any other symptoms we will keep a close watch.  Discussed with her once the vraylar is tapered down her prolactin can be rechecked.  In the meantime we will monitor her closely.  Also discussed that her BUN-creatinine ratio as per most recent labs was slightly higher at 34.  Discussed with patient that a copy of her labs has been sent to her primary medical doctor who can continue to monitor her closely.  She will also reach out to him for further recommendations. Visit Diagnosis:    ICD-10-CM   1. Bipolar I disorder, current or most recent episode manic, in partial remission with anxious distress ([place]) F31.73   2. GAD (generalized anxiety disorder) F41.1   3. Insomnia due to mental disorder F51.05     Past Psychiatric History: I have reviewed past psychiatric history from my progress note on 06/17/2017.  Past Medical History:  Past Medical History:  Diagnosis Date  . Anginal pain ([place])   . Anxiety   . Bipolar 1 disorder ([place])   . Bipolar disorder ([place])   . Coronary artery disease   .  Depression   . Fatigue   . Fibromyalgia   . GERD (gastroesophageal reflux disease)   . Heart disease   . Heart failure ([place])   . HLD (hyperlipidemia)   . HOH (hard of hearing)   . Hypothyroidism   . IBS (irritable bowel syndrome)   . Pelvic pain in female   . Perianal lesion    high grade sil lesion- keratinizing type  . RA (rheumatoid arthritis) ([place])   . Raynaud phenomenon   . Sjogren's disease ([place])   . Sleep apnea   . Vaginal Pap smear, abnormal     Past Surgical History:  Procedure Laterality Date  . CATARACT EXTRACTION W/PHACO Left 02/23/2017   Procedure: CATARACT  EXTRACTION PHACO AND INTRAOCULAR LENS PLACEMENT (IOC);  Surgeon: Birder Robson, MD;  Location: [facility] ORS;  Service: Ophthalmology;  Laterality: Left;  [place] 00:35 AP% 9.6 CDE 3.47 Fluid pack lot # [id number]  . CATARACT EXTRACTION W/PHACO Right 03/30/2017   Procedure: CATARACT EXTRACTION PHACO AND INTRAOCULAR LENS PLACEMENT ([place]);  Surgeon: Birder Robson, MD;  Location: [facility] ORS;  Service: Ophthalmology;  Laterality: Right;  [place] 00:32.6 AP% 13.8 CDE 4.51 Fluid Pack Lot # [id number]  . EYE SURGERY    . TONSILLECTOMY      Family Psychiatric History: I have reviewed family psychiatric history from my progress note on 06/17/2017  Family History:  Family History  Problem Relation Age of Onset  . Heart failure Father   . Parkinson's disease Mother   . Bipolar disorder Mother   . Depression Sister   . Schizophrenia Paternal Grandmother   . Diabetes Maternal Grandmother   . Pancreatic cancer Maternal Grandmother   . Cancer Neg Hx   . Heart disease Neg Hx     Social History: Have reviewed social history from my progress note on 06/17/2017. Social History   Socioeconomic History  . Marital status: Divorced    Spouse name: Not on file  . Number of children: 2  . Years of education: Not on file  . Highest education level: Master's degree (e.g., MA, MS, MEng, MEd, MSW, MBA)  Occupational History    Comment: retired2  Social Needs  . Financial resource strain: Somewhat hard  . Food insecurity:    Worry: Never true    Inability: Never true  . Transportation needs:    Medical: No    Non-medical: No  Tobacco Use  . Smoking status: Never Smoker  . Smokeless tobacco: Former Network engineer and Sexual Activity  . Alcohol use: No    Alcohol/week: 0.0 standard drinks  . Drug use: No  . Sexual activity: Never    Birth control/protection: Post-menopausal  Lifestyle  . Physical activity:    Days per week: 0 days    Minutes per session: 0 min  . Stress: Only a little  Relationships   . Social connections:    Talks on phone: Not on file    Gets together: Not on file    Attends religious service: Never    Active member of club or organization: No    Attends meetings of clubs or organizations: Never    Relationship status: Divorced  Other Topics Concern  . Not on file  Social History Narrative  . Not on file    Allergies:  Allergies  Allergen Reactions  . Amoxicillin-Pot Clavulanate Nausea Only and Other (See Comments)    Diarrhea Has patient had a PCN reaction causing immediate rash, facial/tongue/throat swelling, SOB or lightheadedness with hypotension:  Has  patient had a PCN reaction causing severe rash involving mucus membranes or skin necrosis: Has patient had a PCN reaction that required hospitalization:  Has patient had a PCN reaction occurring within the last 10 years: If all of the above answers are "NO", then may proceed with Cephalosporin use.   Marland Kitchen Oxycodone-Acetaminophen Nausea Only  . Erythromycin Nausea Only  . Azithromycin Nausea And Vomiting  . Codeine   . Macrolides And Ketolides   . Oxycodone-Acetaminophen   . Penicillins Other (See Comments)    Has patient had a PCN reaction causing immediate rash, facial/tongue/throat swelling, SOB or lightheadedness with hypotension: NO Has patient had a PCN reaction causing severe rash involving mucus membranes or skin necrosis: NO Has patient had a PCN reaction that required hospitalization: NO Has patient had a PCN reaction occurring within the last 10 years: NO If all of the above answers are "NO", then may proceed with Cephalosporin use.   . Tramadol Nausea And Vomiting  . Lamotrigine Rash    LAMICTAL  . Sulfa Antibiotics Rash    Metabolic Disorder Labs: Lab Results  Component Value Date   HGBA1C 5.3 11/22/2017   Lab Results  Component Value Date   PROLACTIN 33.0 (H) 11/22/2017   Lab Results  Component Value Date   CHOL 219 (H) 11/22/2017   TRIG 105 11/22/2017   HDL 79 11/22/2017    LDLCALC 119 (H) 11/22/2017   No results found for: TSH  Therapeutic Level Labs: No results found for: LITHIUM Lab Results  Component Value Date   VALPROATE 63 11/22/2017   VALPROATE 59 05/13/2017   No components found for:  CBMZ  Current Medications: Current Outpatient Medications  Medication Sig Dispense Refill  . Abatacept (ORENCIA IV) Inject 1 Dose into the vein every 30 (thirty) days.    Marland Kitchen aspirin 81 MG chewable tablet Chew 81 mg by mouth daily with breakfast.    . Calcium Citrate-Vitamin D ([employer] CALCIUM CITRATE +D3 MINI) 200-250 MG-UNIT TABS Take 1 tablet by mouth daily after breakfast.     . carbamazepine (TEGRETOL XR) 200 MG 12 hr tablet TAKE 2 TABLETS BY MOUTH 2 TIMES DAILY 360 tablet 1  . cariprazine (VRAYLAR) capsule Take 1 capsule (3 mg total) by mouth daily. 30 capsule 3  . cetirizine (ZYRTEC) 10 MG tablet Take by mouth.    . divalproex (DEPAKOTE ER) 500 MG 24 hr tablet TAKE 1 TABLET BY MOUTH IN THE MORNING AND 1 TABLET AT NIGHT TIME 409 tablet 1  . folic acid (FOLVITE) 1 MG tablet Take 2 mg by mouth daily with breakfast.     . gabapentin (NEURONTIN) 300 MG capsule TAKE 1 CAPSULE BY MOUTH 2 TIMES DAILY 180 capsule 1  . hydrOXYzine (ATARAX/VISTARIL) 10 MG tablet Take 1-2 tablets (10-20 mg total) by mouth at bedtime as needed (FOR SLEEP). 180 tablet 1  . levothyroxine (SYNTHROID, LEVOTHROID) 75 MCG tablet TAKE 1 TABLET BY MOUTH EACH DAY ON EMPTYSTOMACH WITH A GLASS OF WATERAT LEAST 30 TO 60 MINUTES BEFORE BREAKFAST IN THE MORNING    . methotrexate 2.5 MG tablet Take 15 mg by mouth every Wednesday. WEDNESDAYS AT LUNCH    . mirtazapine (REMERON) 15 MG tablet Take 0.5 tablets (7.5 mg total) by mouth at bedtime. 45 tablet 1  . moxifloxacin (VIGAMOX) 0.5 % ophthalmic solution Place 1 drop into the right eye 2 (two) times daily.    . Multiple Vitamins-Minerals (MULTIVITAMIN WITH MINERALS) tablet Take 1 tablet by mouth daily with breakfast.     .  nitroGLYCERIN (NITROSTAT) 0.4 MG SL  tablet Place 0.4 mg under the tongue every 5 (five) minutes as needed for chest pain.     Marland Kitchen ondansetron (ZOFRAN) 8 MG tablet TAKE 1 TABLET BY MOUTH EVERY 12 HOURS ASNEEDED FOR NAUSEA    . Polyethyl Glycol-Propyl Glycol (SYSTANE) 0.4-0.3 % GEL ophthalmic gel Place 1 application into both eyes 3 (three) times daily as needed (typically twice daily). Equate Brand     . pravastatin (PRAVACHOL) 10 MG tablet Take 10 mg by mouth at bedtime.     . ranitidine (ZANTAC) 150 MG tablet Take 150 mg by mouth 2 (two) times daily. BREAKFAST & BEDTIME.    Marland Kitchen Zoledronic Acid (RECLAST IV) Inject 1 Dose into the vein.     No current facility-administered medications for this visit.      Musculoskeletal: Strength & Muscle Tone: within normal limits Gait & Station: uses a cane Patient leans: N/A  Psychiatric Specialty Exam: Review of Systems  Psychiatric/Behavioral: The patient is nervous/anxious.   All other systems reviewed and are negative.   Blood pressure 136/86, pulse 76, height 5' (1.524 m), weight 114 lb (51.7 kg), SpO2 92 %.Body mass index is 22.26 kg/m.  General Appearance: Casual  Eye Contact:  Fair  Speech:  Normal Rate  Volume:  Normal  Mood:  Anxious  Affect:  Congruent  Thought Process:  Goal Directed and Descriptions of Associations: Intact  Orientation:  Full (Time, Place, and Person)  Thought Content: Logical   Suicidal Thoughts:  No  Homicidal Thoughts:  No  Memory:  Immediate;   Fair Recent;   Fair Remote;   Fair  Judgement:  Fair  Insight:  Fair  Psychomotor Activity:  has mild tremors , due to arthritis - chronic  Concentration:  Concentration: Fair and Attention Span: Fair  Recall:  [employer] of Knowledge: Fair  Language: Fair  Akathisia:  No  Handed:  Right  AIMS (if indicated): has mild BL tremors - reports chronic due to arthritis Denies stiffness, rigidity  Assets:  Communication Skills Desire for Improvement Social Support  ADL's:  Intact  Cognition: WNL   Sleep:  fair , but reports sleepiness during the day   Screenings: AIMS     Office Visit from 11/11/2017 in [facility] Total Score  0       Assessment and Plan: Stephanie [name] is a 20 year old Caucasian female, divorced, lives in [place], has a history of bipolar disorder, presented to the clinic today for a follow-up visit.  Patient today reports she is doing well on the current medication regimen with regards to her mood symptoms however has been struggling with some side effects of the medication like excessive sleep and weight changes.  Discussed plan with patient as noted below.  Plan Bipolar disorder Continue Tegretol extended release 400 mg p.o. twice daily Tegretol level on 11/22/2017-8.6-therapeutic. Continue Depakote extended release 500 mg p.o. twice daily Depakote level on 11/22/2017-63-therapeutic Gabapentin 300 mg p.o. twice daily Vraylar 3 mg po daily.  Insomnia Patient reports some excessive sleep during the day. Discussed with patient to stop the hydroxyzine at bedtime for now. Also discussed increasing mirtazapine to 15 mg p.o. nightly to see if that will take away some of the drowsiness during the day.  Discussed with patient to reach out to writer in a week to discuss how she is doing with regards to her sleepiness during the day.  Pt to reach out to her primary medical doctor - regarding  her CMP. Discussed her prolactin level being elevated-discussed with patient it could be secondary to her antipsychotic medication.  We will continue to monitor her closely.  She is currently asymptomatic.  Follow-up in clinic in 2 months or sooner if needed.  More than 50 % of the time was spent for psychoeducation and supportive psychotherapy and care coordination.  This note was generated in part or whole with voice recognition software. Voice recognition is usually quite accurate but there are transcription errors that can and very often do occur. I  apologize for any typographical errors that were not detected and corrected.          Ursula Alert, MD 12/23/2017, 12:53 PM

## 2017-12-23 NOTE — Patient Instructions (Signed)
PLEASE START TAKING MIRTAZAPINE 15 MG TABLET ( ONE WHOLE TABLET) FOR SLEEP. PLEASE CALL ME AFTER A WEEK AND LET ME KNOW HOW YOU ARE DOING WITH REGARDS TO YOUR SLEEP.  PLEASE STOP HYDROXYZINE AS NEEDED FOR SLEEP FOR NOW , SINCE YOU FEEL VERY SLEEPY DURING THE DAY.

## 2017-12-30 ENCOUNTER — Telehealth: Payer: Self-pay

## 2017-12-30 NOTE — Telephone Encounter (Signed)
OK as long as she is not sleeping during the day its fine. Please ask her to give medication more time and she will get used to some of the excessive drowsiness.

## 2017-12-30 NOTE — Telephone Encounter (Signed)
pt called left message that she is getting very sleepy at around 7:00 states that she went to bed yesterday at 8:30

## 2017-12-31 NOTE — Telephone Encounter (Signed)
I have left patient two message.

## 2018-01-03 NOTE — Telephone Encounter (Signed)
Ok good to know she is more alert

## 2018-01-03 NOTE — Telephone Encounter (Signed)
pt states that she feel better at her alertness after takin sleep medication .

## 2018-01-12 ENCOUNTER — Telehealth: Payer: Self-pay | Admitting: Obstetrics and Gynecology

## 2018-01-12 NOTE — Telephone Encounter (Signed)
Pt aware no need to sign any release to transfer her care to [facility]. Pt currently not having any issues and doesn't require an appt.

## 2018-01-12 NOTE — Telephone Encounter (Signed)
The patient called and stated that she would like to speak with Joyice Faster in regards to Dr. Tennis Must leaving the practice and moving forward with Dr. Marcelline Mates. The patient has a few questions and concerns and would like to speak with someone today if possible before the end of the day. The patient also expressed that she will be home for most of the day and would like both contact numbers to be called to reach her if possible. Please advise.

## 2018-01-18 ENCOUNTER — Telehealth: Payer: Self-pay

## 2018-01-18 DIAGNOSIS — F5105 Insomnia due to other mental disorder: Secondary | ICD-10-CM

## 2018-01-18 MED ORDER — MIRTAZAPINE 15 MG PO TABS: 15.0000 mg | ORAL_TABLET | Freq: Every day | ORAL | 1 refills | Status: DC

## 2018-01-18 NOTE — Telephone Encounter (Signed)
I have sent remeron 15 mg to her pharmacy.

## 2018-01-18 NOTE — Telephone Encounter (Signed)
pharmacy left message that a new rx for the mirtazapine with new instructions.  pt told them that she is taking 1 table instead of 1/2 tablet. so they need a new rx sent   mirtazapine (REMERON) 15 MG tablet  Medication  Date: 11/11/2017 Department: [facility] Ordering/Authorizing: Ursula Alert, MD  Order Providers   Prescribing Provider Encounter Provider  Ursula Alert, MD Ursula Alert, MD  Outpatient Medication Detail    Disp Refills Start End   mirtazapine (REMERON) 15 MG tablet 45 tablet 1 11/11/2017    Sig - Route: Take 0.5 tablets (7.5 mg total) by mouth at bedtime. - Oral   Sent to pharmacy as: mirtazapine (REMERON) 15 MG tablet   E-Prescribing Status: Receipt confirmed by pharmacy (11/11/2017 11:08 AM EDT)

## 2018-01-24 ENCOUNTER — Telehealth: Payer: Self-pay

## 2018-01-24 MED ORDER — CARIPRAZINE HCL 1.5 MG PO CAPS: 1.5000 mg | ORAL_CAPSULE | Freq: Every day | ORAL | 0 refills | Status: DC

## 2018-01-24 NOTE — Telephone Encounter (Signed)
pt called left message that she wanted to speak with dr. Shea Evans about going off the vraylar.

## 2018-01-24 NOTE — Telephone Encounter (Signed)
Spoke to [name]. She wants to decrease dosage of Vraylar to 1.5 mg since she reports she feels better now with regards to her mood and does not want to be on it for too long. She would like to pick up samples and will be able to come on Wednesday. She wants Janett Billow CMA to give her a  Call and let her know what time to come in on Wednesday. She is worried that she may not be available when Janett Billow CMA returns her call tomorrow. Reassured her that Janett Billow will leave her a message on her voicemail if patient does not pick up her call.

## 2018-01-26 NOTE — Telephone Encounter (Signed)
Since pt did not pick up sample they will be put back in drug cabinet.

## 2018-01-26 NOTE — Telephone Encounter (Signed)
Pt stated that she was coming off the vraylar .  She states it is 30 pills in her bottle of the 1.5mg  and she wanted to know if that is ok to come off of.?

## 2018-01-26 NOTE — Telephone Encounter (Signed)
Samples of vraylar 1.5mg  was given.  4 boxes  of id # [id number] exp 12/21 and 6 boxes of id# [id number] exp  11-21.  Pt notified to pick up

## 2018-02-09 ENCOUNTER — Ambulatory Visit: Payer: Medicare Other | Admitting: Psychiatry

## 2018-02-09 ENCOUNTER — Encounter: Payer: Self-pay | Admitting: Psychiatry

## 2018-02-09 VITALS — BP 132/85 | HR 72 | Wt 112.0 lb

## 2018-02-09 DIAGNOSIS — G4700 Insomnia, unspecified: Secondary | ICD-10-CM | POA: Diagnosis not present

## 2018-02-09 DIAGNOSIS — F411 Generalized anxiety disorder: Secondary | ICD-10-CM | POA: Diagnosis not present

## 2018-02-09 DIAGNOSIS — F3131 Bipolar disorder, current episode depressed, mild: Secondary | ICD-10-CM

## 2018-02-09 MED ORDER — GABAPENTIN 300 MG PO CAPS: ORAL_CAPSULE | ORAL | 1 refills | Status: DC

## 2018-02-09 MED ORDER — CARBAMAZEPINE ER 200 MG PO TB12: 400.0000 mg | ORAL_TABLET | Freq: Two times a day (BID) | ORAL | 1 refills | Status: DC

## 2018-02-09 MED ORDER — DIVALPROEX SODIUM ER 500 MG PO TB24: ORAL_TABLET | ORAL | 1 refills | Status: DC

## 2018-02-09 MED ORDER — CARIPRAZINE HCL 1.5 MG PO CAPS: 1.5000 mg | ORAL_CAPSULE | Freq: Every day | ORAL | 0 refills | Status: DC

## 2018-02-09 NOTE — Progress Notes (Signed)
[facility] MD OP Progress Note  02/09/2018 5:29 PM Stephanie [name]  MRN:  [id number]  Chief Complaint: ' I am here for follow up." Chief Complaint    Follow-up     HPI: Stephanie [name] is a 72 year old Caucasian female, divorced, lives in [place], presented to the clinic today for a follow-up visit.  Patient has a history of bipolar disorder, coronary artery disease, Sjogren's syndrome, rheumatoid arthritis.  Patient today reports that she continues to have some on and off anxiety and depressive symptoms.  She reports she feels better than before however does feel overwhelmed at times.  She reports she does have some passive suicidality which comes and goes.  She reports she does not have any active plans and is willing to ask for help if her thoughts worsens.  Patient initially reported that she wanted to get off of Vraylar.  However when she mentioned that she continues to have some depression and anxiety symptoms discussed with patient to stay on Vraylar at this time.  Also discussed with patient that she needs to restart psychotherapy sessions as soon as possible.  She reports she has upcoming appointment in January.  However discussed with patient to call her therapist today to have more regular follow-up.  Patient agrees with plan.  Patient reports she had a good Thanksgiving with her son.  She reports she continues to be active at church and also has good friends who are supportive.  Reports she continues to sleep well on the mirtazapine.  She continues to want to be continued on her other medications like Depakote, carbamazepine and gabapentin and does not want any readjustment today.  Patient reports she is interested in getting samples for vraylar since she is worried about co-pay today. Visit Diagnosis:    ICD-10-CM   1. Bipolar disorder, current episode depressed, mild (HCC) F31.31 carbamazepine (TEGRETOL XR) 200 MG 12 hr tablet    divalproex (DEPAKOTE ER) 500 MG 24 hr tablet    gabapentin  (NEURONTIN) 300 MG capsule  2. GAD (generalized anxiety disorder) F41.1   3. Insomnia, unspecified type G47.00     Past Psychiatric History: I have reviewed past psychiatric history from my progress note on 06/17/2017.  Past Medical History:  Past Medical History:  Diagnosis Date  . Anginal pain ([place])   . Anxiety   . Bipolar 1 disorder ([place])   . Bipolar disorder ([place])   . Coronary artery disease   . Depression   . Fatigue   . Fibromyalgia   . GERD (gastroesophageal reflux disease)   . Heart disease   . Heart failure ([place])   . HLD (hyperlipidemia)   . HOH (hard of hearing)   . Hypothyroidism   . IBS (irritable bowel syndrome)   . Pelvic pain in female   . Perianal lesion    high grade sil lesion- keratinizing type  . RA (rheumatoid arthritis) ([place])   . Raynaud phenomenon   . Sjogren's disease ([place])   . Sleep apnea   . Vaginal Pap smear, abnormal     Past Surgical History:  Procedure Laterality Date  . CATARACT EXTRACTION W/PHACO Left 02/23/2017   Procedure: CATARACT EXTRACTION PHACO AND INTRAOCULAR LENS PLACEMENT (IOC);  Surgeon: Birder Robson, MD;  Location: [facility] ORS;  Service: Ophthalmology;  Laterality: Left;  [place] 00:35 AP% 9.6 CDE 3.47 Fluid pack lot # [id number]  . CATARACT EXTRACTION W/PHACO Right 03/30/2017   Procedure: CATARACT EXTRACTION PHACO AND INTRAOCULAR LENS PLACEMENT ([place]);  Surgeon: Birder Robson, MD;  Location:  [facility] ORS;  Service: Ophthalmology;  Laterality: Right;  [place] 00:32.6 AP% 13.8 CDE 4.51 Fluid Pack Lot # [id number]  . EYE SURGERY    . TONSILLECTOMY      Family Psychiatric History: Reviewed family psychiatric history from my progress note on 06/17/2017.  Family History:  Family History  Problem Relation Age of Onset  . Heart failure Father   . Parkinson's disease Mother   . Bipolar disorder Mother   . Depression Sister   . Schizophrenia Paternal Grandmother   . Diabetes Maternal Grandmother   . Pancreatic cancer Maternal Grandmother    . Cancer Neg Hx   . Heart disease Neg Hx     Social History: Have reviewed social history from my progress note on 06/17/2017. Social History   Socioeconomic History  . Marital status: Divorced    Spouse name: Not on file  . Number of children: 2  . Years of education: Not on file  . Highest education level: Master's degree (e.g., MA, MS, MEng, MEd, MSW, MBA)  Occupational History    Comment: retired2  Social Needs  . Financial resource strain: Somewhat hard  . Food insecurity:    Worry: Never true    Inability: Never true  . Transportation needs:    Medical: No    Non-medical: No  Tobacco Use  . Smoking status: Never Smoker  . Smokeless tobacco: Former Network engineer and Sexual Activity  . Alcohol use: No    Alcohol/week: 0.0 standard drinks  . Drug use: No  . Sexual activity: Never    Birth control/protection: Post-menopausal  Lifestyle  . Physical activity:    Days per week: 0 days    Minutes per session: 0 min  . Stress: Only a little  Relationships  . Social connections:    Talks on phone: Not on file    Gets together: Not on file    Attends religious service: Never    Active member of club or organization: No    Attends meetings of clubs or organizations: Never    Relationship status: Divorced  Other Topics Concern  . Not on file  Social History Narrative  . Not on file    Allergies:  Allergies  Allergen Reactions  . Amoxicillin-Pot Clavulanate Nausea Only and Other (See Comments)    Diarrhea Has patient had a PCN reaction causing immediate rash, facial/tongue/throat swelling, SOB or lightheadedness with hypotension:  Has patient had a PCN reaction causing severe rash involving mucus membranes or skin necrosis: Has patient had a PCN reaction that required hospitalization:  Has patient had a PCN reaction occurring within the last 10 years: If all of the above answers are "NO", then may proceed with Cephalosporin use.   Marland Kitchen Oxycodone-Acetaminophen  Nausea Only  . Erythromycin Nausea Only  . Azithromycin Nausea And Vomiting  . Codeine   . Macrolides And Ketolides   . Oxycodone-Acetaminophen   . Penicillins Other (See Comments)    Has patient had a PCN reaction causing immediate rash, facial/tongue/throat swelling, SOB or lightheadedness with hypotension: NO Has patient had a PCN reaction causing severe rash involving mucus membranes or skin necrosis: NO Has patient had a PCN reaction that required hospitalization: NO Has patient had a PCN reaction occurring within the last 10 years: NO If all of the above answers are "NO", then may proceed with Cephalosporin use.   . Tramadol Nausea And Vomiting  . Lamotrigine Rash    LAMICTAL  . Sulfa Antibiotics Rash  Metabolic Disorder Labs: Lab Results  Component Value Date   HGBA1C 5.3 11/22/2017   Lab Results  Component Value Date   PROLACTIN 33.0 (H) 11/22/2017   Lab Results  Component Value Date   CHOL 219 (H) 11/22/2017   TRIG 105 11/22/2017   HDL 79 11/22/2017   LDLCALC 119 (H) 11/22/2017   No results found for: TSH  Therapeutic Level Labs: No results found for: LITHIUM Lab Results  Component Value Date   VALPROATE 63 11/22/2017   VALPROATE 59 05/13/2017   No components found for:  CBMZ  Current Medications: Current Outpatient Medications  Medication Sig Dispense Refill  . Abatacept (ORENCIA IV) Inject 1 Dose into the vein every 30 (thirty) days.    Marland Kitchen aspirin 81 MG chewable tablet Chew 81 mg by mouth daily with breakfast.    . Calcium Citrate-Vitamin D ([employer] CALCIUM CITRATE +D3 MINI) 200-250 MG-UNIT TABS Take 1 tablet by mouth daily after breakfast.     . carbamazepine (TEGRETOL XR) 200 MG 12 hr tablet Take 2 tablets (400 mg total) by mouth 2 (two) times daily. 360 tablet 1  . cariprazine (VRAYLAR) capsule Take 1 capsule (1.5 mg total) by mouth daily. 90 capsule 0  . divalproex (DEPAKOTE ER) 500 MG 24 hr tablet TAKE 1 TABLET BY MOUTH IN THE MORNING AND 1 TABLET AT  NIGHT TIME 119 tablet 1  . folic acid (FOLVITE) 1 MG tablet Take 2 mg by mouth daily with breakfast.     . gabapentin (NEURONTIN) 300 MG capsule TAKE 1 CAPSULE BY MOUTH 2 TIMES DAILY 180 capsule 1  . levothyroxine (SYNTHROID, LEVOTHROID) 75 MCG tablet TAKE 1 TABLET BY MOUTH EACH DAY ON EMPTYSTOMACH WITH A GLASS OF WATERAT LEAST 30 TO 60 MINUTES BEFORE BREAKFAST IN THE MORNING    . methotrexate 2.5 MG tablet Take 15 mg by mouth every Wednesday. WEDNESDAYS AT LUNCH    . mirtazapine (REMERON) 15 MG tablet Take 1 tablet (15 mg total) by mouth at bedtime. 90 tablet 1  . moxifloxacin (VIGAMOX) 0.5 % ophthalmic solution Place 1 drop into the right eye 2 (two) times daily.    . Multiple Vitamins-Minerals (MULTIVITAMIN WITH MINERALS) tablet Take 1 tablet by mouth daily with breakfast.     . nitroGLYCERIN (NITROSTAT) 0.4 MG SL tablet Place 0.4 mg under the tongue every 5 (five) minutes as needed for chest pain.     Marland Kitchen ondansetron (ZOFRAN) 8 MG tablet TAKE 1 TABLET BY MOUTH EVERY 12 HOURS ASNEEDED FOR NAUSEA    . Polyethyl Glycol-Propyl Glycol (SYSTANE) 0.4-0.3 % GEL ophthalmic gel Place 1 application into both eyes 3 (three) times daily as needed (typically twice daily). Equate Brand     . pravastatin (PRAVACHOL) 10 MG tablet Take 10 mg by mouth at bedtime.     . Zoledronic Acid (RECLAST IV) Inject 1 Dose into the vein.    . cetirizine (ZYRTEC) 10 MG tablet Take by mouth.    . hydrOXYzine (ATARAX/VISTARIL) 10 MG tablet Take 1-2 tablets (10-20 mg total) by mouth at bedtime as needed (FOR SLEEP). (Patient not taking: Reported on 02/09/2018) 180 tablet 1  . ranitidine (ZANTAC) 150 MG tablet Take 150 mg by mouth 2 (two) times daily. BREAKFAST & BEDTIME.     No current facility-administered medications for this visit.      Musculoskeletal: Strength & Muscle Tone: within normal limits Gait & Station: normal Patient leans: N/A  Psychiatric Specialty Exam: Review of Systems  Psychiatric/Behavioral: The  patient is nervous/anxious.  All other systems reviewed and are negative.   Blood pressure 132/85, pulse 72, weight 112 lb (50.8 kg), SpO2 94 %.Body mass index is 21.87 kg/m.  General Appearance: Casual  Eye Contact:  Fair  Speech:  Normal Rate  Volume:  Normal  Mood:  Anxious  Affect:  Congruent  Thought Process:  Goal Directed and Descriptions of Associations: Intact  Orientation:  Full (Time, Place, and Person)  Thought Content: Logical   Suicidal Thoughts:  reports some SI on and off , however denies it now and denies any active plan.   Homicidal Thoughts:  No  Memory:  Immediate;   Fair Recent;   Fair Remote;   Fair  Judgement:  Fair  Insight:  Fair  Psychomotor Activity:  Normal  Concentration:  Concentration: Fair and Attention Span: Fair  Recall:  [employer] of Knowledge: Fair  Language: Fair  Akathisia:  No  Handed:  Right  AIMS (if indicated): 0  Assets:  Communication Skills Desire for Improvement Social Support  ADL's:  Intact  Cognition: WNL  Sleep:  Fair   Screenings: AIMS     Office Visit from 11/11/2017 in [place] Total Score  0       Assessment and Plan: Kainat is a 72 yr old Caucasian female, divorced, lives in [place], has a history of bipolar disorder, presented to the clinic today for a follow-up visit.  Patient continues to have some depressive symptoms.  She does report some suicidal thoughts on and off.  She however currently denies any active plan, agrees to ask for help.  She does have good social support system, friends were supportive as well as a son who lives close to her.  Patient is motivated to stay on medications as well as psychotherapy session.  She agrees to call her therapist today to make a sooner appointment.  Patient denies history of suicide attempts and denies history of suicide in her family.  Hence acute risk is currently low however chronic risk is moderate.  We will continue to make  medication changes as noted below.  Plan Bipolar disorder Tegretol extended release 400 mg p.o. twice daily Tegretol level on 11/22/2017-8.6-therapeutic Continue Depakote extended release 500 mg p.o. twice daily Depakote level 11/22/2017-63-therapeutic Gabapentin 300 mg p.o. twice daily Vraylar 1.5 mg po daily.  GAD Review medications as prescribed. Continue CBT.  For insomnia- improved Mirtazapine 15 mg p.o. nightly Patient also has hydroxyzine as needed for sleep.  Patient to reach out to her therapist [name] sooner.Writer contacted [name] and discussed patient's treatment plan today.  [name] is aware about patient's recent depressive symptoms and will reach out to her to start more frequent psychotherapy sessions.  Follow-up in clinic in 2 weeks or sooner if needed.  More than 50 % of the time was spent for psychoeducation and supportive psychotherapy and care coordination.  This note was generated in part or whole with voice recognition software. Voice recognition is usually quite accurate but there are transcription errors that can and very often do occur. I apologize for any typographical errors that were not detected and corrected.         Ursula Alert, MD 02/09/2018, 5:29 PM

## 2018-02-11 ENCOUNTER — Telehealth: Payer: Self-pay

## 2018-02-11 NOTE — Telephone Encounter (Signed)
Per Dr. Shea Evans I gave the patient samples of Vraylar 1.5 mg tabs, 8 boxes with 7 tabs in each box. Patient was also give a co pay card for Vraylar.        Lot number [id number], exp 06/2020, three of these boxes were given,       [id number], exp 02/2020, three of these boxes were give,         [id number], exp 01/2020, two of these boxes were given to the patient.  Also informed the patient to call with any questions or concerns.

## 2018-02-16 ENCOUNTER — Ambulatory Visit: Payer: Medicare Other | Admitting: Psychiatry

## 2018-02-23 ENCOUNTER — Ambulatory Visit: Payer: Medicare Other | Admitting: Psychiatry

## 2018-02-23 ENCOUNTER — Other Ambulatory Visit: Payer: Self-pay

## 2018-02-23 ENCOUNTER — Encounter: Payer: Self-pay | Admitting: Psychiatry

## 2018-02-23 VITALS — BP 146/78 | HR 81 | Temp 97.7°F | Wt 112.4 lb

## 2018-02-23 DIAGNOSIS — F411 Generalized anxiety disorder: Secondary | ICD-10-CM | POA: Diagnosis not present

## 2018-02-23 DIAGNOSIS — F3131 Bipolar disorder, current episode depressed, mild: Secondary | ICD-10-CM

## 2018-02-23 NOTE — Progress Notes (Signed)
Stephanie [name] OP Progress Note  02/23/2018 12:29 PM Stephanie [name]  MRN:  [id number]  Chief Complaint: ' I am here for follow up." Chief Complaint    Follow-up; Medication Refill     HPI: Stephanie [name] is a 72 year old Caucasian female, divorced, lives in [place], presented to the clinic today for a follow-up visit.  Patient has a history of bipolar disorder, coronary artery disease, Sjogren's syndrome, rheumatoid arthritis.  Patient today reports she is currently making progress with regards to her mood symptoms.  She reports she has been hold onto her faith which has been helpful.  She denies any suicidality at this time.  She does continue to have some psychosocial stressors of family problems.  Patient however reports her psychotherapy visits with [name] as well as her prayers are helpful.  Patient reports sleep and appetite is good.  Patient is compliant on her medications as prescribed.  She denies any side effects to the medications.  An aims scale done today equals 0.  Patient does have a history of tremors which are due to her rheumatoid arthritis and not medication induced.  Patient looks forward to Christmas holidays and plans to spend it with her family.  Patient today presented with her friend-[name],who provided collateral information.   Visit Diagnosis:    ICD-10-CM   1. Bipolar disorder, current episode depressed, mild ([place]) F31.31   2. GAD (generalized anxiety disorder) F41.1     Past Psychiatric History: Reviewed past psychiatric history from my progress note on 06/17/2017.  Past Medical History:  Past Medical History:  Diagnosis Date  . Anginal pain ([place])   . Anxiety   . Bipolar 1 disorder ([place])   . Bipolar disorder ([place])   . Coronary artery disease   . Depression   . Fatigue   . Fibromyalgia   . GERD (gastroesophageal reflux disease)   . Heart disease   . Heart failure ([place])   . HLD (hyperlipidemia)   . HOH (hard of hearing)   . Hypothyroidism   . IBS  (irritable bowel syndrome)   . Pelvic pain in female   . Perianal lesion    high grade sil lesion- keratinizing type  . RA (rheumatoid arthritis) ([place])   . Raynaud phenomenon   . Sjogren's disease ([place])   . Sleep apnea   . Vaginal Pap smear, abnormal     Past Surgical History:  Procedure Laterality Date  . CATARACT EXTRACTION W/PHACO Left 02/23/2017   Procedure: CATARACT EXTRACTION PHACO AND INTRAOCULAR LENS PLACEMENT (IOC);  Surgeon: Birder Robson, [name];  Location: [facility] ORS;  Service: Ophthalmology;  Laterality: Left;  [place] 00:35 AP% 9.6 CDE 3.47 Fluid pack lot # [id number]  . CATARACT EXTRACTION W/PHACO Right 03/30/2017   Procedure: CATARACT EXTRACTION PHACO AND INTRAOCULAR LENS PLACEMENT ([place]);  Surgeon: Birder Robson, [name];  Location: [facility] ORS;  Service: Ophthalmology;  Laterality: Right;  [place] 00:32.6 AP% 13.8 CDE 4.51 Fluid Pack Lot # [id number]  . EYE SURGERY    . TONSILLECTOMY      Family Psychiatric History: Reviewed family psychiatric history from my progress note on 06/17/2017  Family History:  Family History  Problem Relation Age of Onset  . Heart failure Father   . Parkinson's disease Mother   . Bipolar disorder Mother   . Depression Sister   . Schizophrenia Paternal Grandmother   . Diabetes Maternal Grandmother   . Pancreatic cancer Maternal Grandmother   . Cancer Neg Hx   . Heart disease Neg Hx  Social History: Reviewed social history from my progress note on 06/17/2017 Social History   Socioeconomic History  . Marital status: Divorced    Spouse name: Not on file  . Number of children: 2  . Years of education: Not on file  . Highest education level: Master's degree (e.g., MA, MS, MEng, MEd, MSW, MBA)  Occupational History    Comment: retired2  Social Needs  . Financial resource strain: Somewhat hard  . Food insecurity:    Worry: Never true    Inability: Never true  . Transportation needs:    Medical: No    Non-medical: No  Tobacco Use  .  Smoking status: Never Smoker  . Smokeless tobacco: Former Network engineer and Sexual Activity  . Alcohol use: No    Alcohol/week: 0.0 standard drinks  . Drug use: No  . Sexual activity: Never    Birth control/protection: Post-menopausal  Lifestyle  . Physical activity:    Days per week: 0 days    Minutes per session: 0 min  . Stress: Only a little  Relationships  . Social connections:    Talks on phone: Not on file    Gets together: Not on file    Attends religious service: Never    Active member of club or organization: No    Attends meetings of clubs or organizations: Never    Relationship status: Divorced  Other Topics Concern  . Not on file  Social History Narrative  . Not on file    Allergies:  Allergies  Allergen Reactions  . Amoxicillin-Pot Clavulanate Nausea Only and Other (See Comments)    Diarrhea Has patient had a PCN reaction causing immediate rash, facial/tongue/throat swelling, SOB or lightheadedness with hypotension:  Has patient had a PCN reaction causing severe rash involving mucus membranes or skin necrosis: Has patient had a PCN reaction that required hospitalization:  Has patient had a PCN reaction occurring within the last 10 years: If all of the above answers are "NO", then may proceed with Cephalosporin use.   Marland Kitchen Oxycodone-Acetaminophen Nausea Only  . Erythromycin Nausea Only  . Azithromycin Nausea And Vomiting  . Codeine   . Macrolides And Ketolides   . Oxycodone-Acetaminophen   . Penicillins Other (See Comments)    Has patient had a PCN reaction causing immediate rash, facial/tongue/throat swelling, SOB or lightheadedness with hypotension: NO Has patient had a PCN reaction causing severe rash involving mucus membranes or skin necrosis: NO Has patient had a PCN reaction that required hospitalization: NO Has patient had a PCN reaction occurring within the last 10 years: NO If all of the above answers are "NO", then may proceed with Cephalosporin  use.   . Tramadol Nausea And Vomiting  . Lamotrigine Rash    LAMICTAL  . Sulfa Antibiotics Rash    Metabolic Disorder Labs: Lab Results  Component Value Date   HGBA1C 5.3 11/22/2017   Lab Results  Component Value Date   PROLACTIN 33.0 (H) 11/22/2017   Lab Results  Component Value Date   CHOL 219 (H) 11/22/2017   TRIG 105 11/22/2017   HDL 79 11/22/2017   LDLCALC 119 (H) 11/22/2017   No results found for: TSH  Therapeutic Level Labs: No results found for: LITHIUM Lab Results  Component Value Date   VALPROATE 63 11/22/2017   VALPROATE 59 05/13/2017   No components found for:  CBMZ  Current Medications: Current Outpatient Medications  Medication Sig Dispense Refill  . Abatacept (ORENCIA IV) Inject 1 Dose into  the vein every 30 (thirty) days.    Marland Kitchen aspirin 81 MG chewable tablet Chew 81 mg by mouth daily with breakfast.    . Calcium Citrate-Vitamin D ([employer]S CALCIUM CITRATE +D3 MINI) 200-250 MG-UNIT TABS Take 1 tablet by mouth daily after breakfast.     . carbamazepine (TEGRETOL XR) 200 MG 12 hr tablet Take 2 tablets (400 mg total) by mouth 2 (two) times daily. 360 tablet 1  . cariprazine (VRAYLAR) capsule Take 1 capsule (1.5 mg total) by mouth daily. 90 capsule 0  . cetirizine (ZYRTEC) 10 MG tablet Take by mouth.    . divalproex (DEPAKOTE ER) 500 MG 24 hr tablet TAKE 1 TABLET BY MOUTH IN THE MORNING AND 1 TABLET AT NIGHT TIME 270 tablet 1  . folic acid (FOLVITE) 1 MG tablet Take 2 mg by mouth daily with breakfast.     . gabapentin (NEURONTIN) 300 MG capsule TAKE 1 CAPSULE BY MOUTH 2 TIMES DAILY 180 capsule 1  . hydrOXYzine (ATARAX/VISTARIL) 10 MG tablet Take 1-2 tablets (10-20 mg total) by mouth at bedtime as needed (FOR SLEEP). 180 tablet 1  . levothyroxine (SYNTHROID, LEVOTHROID) 75 MCG tablet TAKE 1 TABLET BY MOUTH EACH DAY ON EMPTYSTOMACH WITH A GLASS OF WATERAT LEAST 30 TO 60 MINUTES BEFORE BREAKFAST IN THE MORNING    . methotrexate 2.5 MG tablet Take 15 mg by mouth every  Wednesday. WEDNESDAYS AT LUNCH    . mirtazapine (REMERON) 15 MG tablet Take 1 tablet (15 mg total) by mouth at bedtime. 90 tablet 1  . Multiple Vitamins-Minerals (MULTIVITAMIN WITH MINERALS) tablet Take 1 tablet by mouth daily with breakfast.     . nitroGLYCERIN (NITROSTAT) 0.4 MG SL tablet Place 0.4 mg under the tongue every 5 (five) minutes as needed for chest pain.     Marland Kitchen ondansetron (ZOFRAN) 8 MG tablet TAKE 1 TABLET BY MOUTH EVERY 12 HOURS ASNEEDED FOR NAUSEA    . Polyethyl Glycol-Propyl Glycol (SYSTANE) 0.4-0.3 % GEL ophthalmic gel Place 1 application into both eyes 3 (three) times daily as needed (typically twice daily). Equate Brand     . pravastatin (PRAVACHOL) 10 MG tablet Take 10 mg by mouth at bedtime.     . ranitidine (ZANTAC) 150 MG tablet Take 150 mg by mouth 2 (two) times daily. BREAKFAST & BEDTIME.    Marland Kitchen Zoledronic Acid (RECLAST IV) Inject 1 Dose into the vein.     No current facility-administered medications for this visit.      Musculoskeletal: Strength & Muscle Tone: within normal limits Gait & Station: normal Patient leans: N/A  Psychiatric Specialty Exam: Review of Systems  Psychiatric/Behavioral: The patient is nervous/anxious (improving).   All other systems reviewed and are negative.   Blood pressure (!) 146/78, pulse 81, temperature 97.7 F (36.5 C), temperature source Oral, weight 112 lb 6.4 oz (51 kg).Body mass index is 21.95 kg/m.  General Appearance: Casual  Eye Contact:  Fair  Speech:  Clear and Coherent  Volume:  Normal  Mood:  Euthymic  Affect:  Congruent  Thought Process:  Goal Directed and Descriptions of Associations: Intact  Orientation:  Full (Time, Place, and Person)  Thought Content: Logical   Suicidal Thoughts:  No  Homicidal Thoughts:  No  Memory:  Immediate;   Fair Recent;   Fair Remote;   Fair  Judgement:  Fair  Insight:  Fair  Psychomotor Activity:  Normal  Concentration:  Concentration: Fair and Attention Span: Fair  Recall:   [employer] of Knowledge: Fair  Language: Fair  Akathisia:  No  Handed:  Right  AIMS (if indicated): 0  Assets:  Communication Skills Desire for Improvement Social Support  ADL's:  Intact  Cognition: WNL  Sleep:  Fair   Screenings: AIMS     Office Visit from 11/11/2017 in [facility] Total Score  0       Assessment and Plan: Barbi is a 72 year old Caucasian female, divorced, lives in [place], has a history of bipolar disorder, presented to the clinic today for a follow-up visit.  Patient is currently making progress on the current medication regimen.  She is compliant with her medications as well as psychotherapy visits.  She looks forward to her holidays and has good social support system.  Will continue plan as noted below.  Plan Bipolar disorder Tegretol extended release 400 mg p.o. twice daily Tegretol level on 11/22/2017-8.6-therapeutic Continue Depakote extended release 500 mg p.o. twice daily Depakote level on 11/22/2017-63-therapeutic Gabapentin 300 mg p.o. twice daily Vraylar 1.5 mg p.o. daily  For GAD Continue CBT  For insomnia Mirtazapine 15 mg p.o. nightly Patient also has hydroxyzine as needed for sleep  Patient will continue psychotherapy visits with [name].  Follow-up in clinic in 1 month or sooner if needed.  More than 50 % of the time was spent for psychoeducation and supportive psychotherapy and care coordination.  This note was generated in part or whole with voice recognition software. Voice recognition is usually quite accurate but there are transcription errors that can and very often do occur. I apologize for any typographical errors that were not detected and corrected.      Ursula Alert, [name] 02/23/2018, 12:29 PM

## 2018-03-10 ENCOUNTER — Other Ambulatory Visit: Payer: Self-pay | Admitting: Family Medicine

## 2018-03-10 DIAGNOSIS — Z1231 Encounter for screening mammogram for malignant neoplasm of breast: Secondary | ICD-10-CM

## 2018-03-25 ENCOUNTER — Other Ambulatory Visit: Payer: Self-pay

## 2018-03-25 ENCOUNTER — Encounter: Payer: Self-pay | Admitting: Psychiatry

## 2018-03-25 ENCOUNTER — Ambulatory Visit: Payer: Medicare Other | Admitting: Psychiatry

## 2018-03-25 VITALS — BP 145/87 | HR 81 | Temp 97.7°F | Wt 112.4 lb

## 2018-03-25 DIAGNOSIS — F3131 Bipolar disorder, current episode depressed, mild: Secondary | ICD-10-CM

## 2018-03-25 DIAGNOSIS — F411 Generalized anxiety disorder: Secondary | ICD-10-CM

## 2018-03-25 DIAGNOSIS — G4700 Insomnia, unspecified: Secondary | ICD-10-CM

## 2018-03-25 NOTE — Progress Notes (Signed)
[facility] MD OP Progress Note  03/25/2018 11:27 AM Stephanie [name]  MRN:  [id number]  Chief Complaint: ' I am here for follow up.' Chief Complaint    Follow-up; Medication Refill     HPI: Stephanie [name] is a 73 year old Caucasian female, divorced, lives in [place], presented to the clinic today for a follow-up visit.  Patient has a history of bipolar disorder, coronary artery disorder, Sjogren's syndrome, rheumatoid arthritis.  Pt today reports that she is currently going through an upper respiratory infection.  She reports hence her sleep is restless due to the cough and congestion.  She reports that prior to that she was doing okay.  She reports her mood symptoms as stable at this time.  She denies any significant depression or hypomanic or manic symptoms.  She denies any suicidality.  She reports appetite is fair.  She continues to be worried about the side effects of being on Vraylar.  Discussed with patient that since she usually gets depressed during the winter months it is better to wait until March or April to taper her off of the [employer].  She agrees with plan.  Aims scale was done today and she scored 0.  Patient reports she is excited that her daughter is moving to [place] which is closer to her.   Visit Diagnosis:    ICD-10-CM   1. Bipolar disorder, current episode depressed, mild ([place]) F31.31   2. GAD (generalized anxiety disorder) F41.1   3. Insomnia, unspecified type G47.00     Past Psychiatric History: I have reviewed past psychiatric history from my progress note on 06/17/2017.  Past Medical History:  Past Medical History:  Diagnosis Date  . Anginal pain ([place])   . Anxiety   . Bipolar 1 disorder ([place])   . Bipolar disorder ([place])   . Coronary artery disease   . Depression   . Fatigue   . Fibromyalgia   . GERD (gastroesophageal reflux disease)   . Heart disease   . Heart failure ([place])   . HLD (hyperlipidemia)   . HOH (hard of hearing)   . Hypothyroidism   . IBS  (irritable bowel syndrome)   . Pelvic pain in female   . Perianal lesion    high grade sil lesion- keratinizing type  . RA (rheumatoid arthritis) ([place])   . Raynaud phenomenon   . Sjogren's disease ([place])   . Sleep apnea   . Vaginal Pap smear, abnormal     Past Surgical History:  Procedure Laterality Date  . CATARACT EXTRACTION W/PHACO Left 02/23/2017   Procedure: CATARACT EXTRACTION PHACO AND INTRAOCULAR LENS PLACEMENT (IOC);  Surgeon: Birder Robson, MD;  Location: [facility] ORS;  Service: Ophthalmology;  Laterality: Left;  [place] 00:35 AP% 9.6 CDE 3.47 Fluid pack lot # [id number]  . CATARACT EXTRACTION W/PHACO Right 03/30/2017   Procedure: CATARACT EXTRACTION PHACO AND INTRAOCULAR LENS PLACEMENT ([place]);  Surgeon: Birder Robson, MD;  Location: [facility] ORS;  Service: Ophthalmology;  Laterality: Right;  [place] 00:32.6 AP% 13.8 CDE 4.51 Fluid Pack Lot # [id number]  . EYE SURGERY    . TONSILLECTOMY      Family Psychiatric History: I have reviewed family psychiatric history from my progress note on 06/17/2017.  Family History:  Family History  Problem Relation Age of Onset  . Heart failure Father   . Parkinson's disease Mother   . Bipolar disorder Mother   . Depression Sister   . Schizophrenia Paternal Grandmother   . Diabetes Maternal Grandmother   . Pancreatic cancer Maternal  Grandmother   . Cancer Neg Hx   . Heart disease Neg Hx     Social History: Reviewed social history from my progress note on 06/17/2017. Social History   Socioeconomic History  . Marital status: Divorced    Spouse name: Not on file  . Number of children: 2  . Years of education: Not on file  . Highest education level: Master's degree (e.g., MA, MS, MEng, MEd, MSW, MBA)  Occupational History    Comment: retired2  Social Needs  . Financial resource strain: Somewhat hard  . Food insecurity:    Worry: Never true    Inability: Never true  . Transportation needs:    Medical: No    Non-medical: No  Tobacco Use   . Smoking status: Never Smoker  . Smokeless tobacco: Former Network engineer and Sexual Activity  . Alcohol use: No    Alcohol/week: 0.0 standard drinks  . Drug use: No  . Sexual activity: Never    Birth control/protection: Post-menopausal  Lifestyle  . Physical activity:    Days per week: 0 days    Minutes per session: 0 min  . Stress: Only a little  Relationships  . Social connections:    Talks on phone: Not on file    Gets together: Not on file    Attends religious service: Never    Active member of club or organization: No    Attends meetings of clubs or organizations: Never    Relationship status: Divorced  Other Topics Concern  . Not on file  Social History Narrative  . Not on file    Allergies:  Allergies  Allergen Reactions  . Amoxicillin-Pot Clavulanate Nausea Only and Other (See Comments)    Diarrhea Has patient had a PCN reaction causing immediate rash, facial/tongue/throat swelling, SOB or lightheadedness with hypotension:  Has patient had a PCN reaction causing severe rash involving mucus membranes or skin necrosis: Has patient had a PCN reaction that required hospitalization:  Has patient had a PCN reaction occurring within the last 10 years: If all of the above answers are "NO", then may proceed with Cephalosporin use.   Marland Kitchen Oxycodone-Acetaminophen Nausea Only  . Erythromycin Nausea Only  . Azithromycin Nausea And Vomiting  . Codeine   . Macrolides And Ketolides   . Oxycodone-Acetaminophen   . Penicillins Other (See Comments)    Has patient had a PCN reaction causing immediate rash, facial/tongue/throat swelling, SOB or lightheadedness with hypotension: NO Has patient had a PCN reaction causing severe rash involving mucus membranes or skin necrosis: NO Has patient had a PCN reaction that required hospitalization: NO Has patient had a PCN reaction occurring within the last 10 years: NO If all of the above answers are "NO", then may proceed with  Cephalosporin use.   . Tramadol Nausea And Vomiting  . Lamotrigine Rash    LAMICTAL  . Sulfa Antibiotics Rash    Metabolic Disorder Labs: Lab Results  Component Value Date   HGBA1C 5.3 11/22/2017   Lab Results  Component Value Date   PROLACTIN 33.0 (H) 11/22/2017   Lab Results  Component Value Date   CHOL 219 (H) 11/22/2017   TRIG 105 11/22/2017   HDL 79 11/22/2017   LDLCALC 119 (H) 11/22/2017   No results found for: TSH  Therapeutic Level Labs: No results found for: LITHIUM Lab Results  Component Value Date   VALPROATE 63 11/22/2017   VALPROATE 59 05/13/2017   No components found for:  CBMZ  Current  Medications: Current Outpatient Medications  Medication Sig Dispense Refill  . Abatacept (ORENCIA IV) Inject 1 Dose into the vein every 30 (thirty) days.    Marland Kitchen aspirin 81 MG chewable tablet Chew 81 mg by mouth daily with breakfast.    . Calcium Citrate-Vitamin D (CVS CALCIUM CITRATE +D3 MINI) 200-250 MG-UNIT TABS Take 1 tablet by mouth daily after breakfast.     . carbamazepine (TEGRETOL XR) 200 MG 12 hr tablet Take 2 tablets (400 mg total) by mouth 2 (two) times daily. 360 tablet 1  . cariprazine (VRAYLAR) capsule Take 1 capsule (1.5 mg total) by mouth daily. 90 capsule 0  . cetirizine (ZYRTEC) 10 MG tablet Take by mouth.    . divalproex (DEPAKOTE ER) 500 MG 24 hr tablet TAKE 1 TABLET BY MOUTH IN THE MORNING AND 1 TABLET AT NIGHT TIME 937 tablet 1  . folic acid (FOLVITE) 1 MG tablet Take 2 mg by mouth daily with breakfast.     . gabapentin (NEURONTIN) 300 MG capsule TAKE 1 CAPSULE BY MOUTH 2 TIMES DAILY 180 capsule 1  . hydrOXYzine (ATARAX/VISTARIL) 10 MG tablet Take 1-2 tablets (10-20 mg total) by mouth at bedtime as needed (FOR SLEEP). 180 tablet 1  . levothyroxine (SYNTHROID, LEVOTHROID) 75 MCG tablet TAKE 1 TABLET BY MOUTH EACH DAY ON EMPTYSTOMACH WITH A GLASS OF WATERAT LEAST 30 TO 60 MINUTES BEFORE BREAKFAST IN THE MORNING    . methotrexate 2.5 MG tablet Take 15 mg  by mouth every Wednesday. WEDNESDAYS AT LUNCH    . mirtazapine (REMERON) 15 MG tablet Take 1 tablet (15 mg total) by mouth at bedtime. 90 tablet 1  . Multiple Vitamins-Minerals (MULTIVITAMIN WITH MINERALS) tablet Take 1 tablet by mouth daily with breakfast.     . nitroGLYCERIN (NITROSTAT) 0.4 MG SL tablet Place 0.4 mg under the tongue every 5 (five) minutes as needed for chest pain.     Marland Kitchen ondansetron (ZOFRAN) 8 MG tablet TAKE 1 TABLET BY MOUTH EVERY 12 HOURS ASNEEDED FOR NAUSEA    . Polyethyl Glycol-Propyl Glycol (SYSTANE) 0.4-0.3 % GEL ophthalmic gel Place 1 application into both eyes 3 (three) times daily as needed (typically twice daily). Equate Brand     . pravastatin (PRAVACHOL) 10 MG tablet Take 10 mg by mouth at bedtime.     . ranitidine (ZANTAC) 150 MG tablet Take 150 mg by mouth 2 (two) times daily. BREAKFAST & BEDTIME.    Marland Kitchen Zoledronic Acid (RECLAST IV) Inject 1 Dose into the vein.    . fluticasone (FLONASE) 50 MCG/ACT nasal spray     . ibandronate (BONIVA) 150 MG tablet      No current facility-administered medications for this visit.      Musculoskeletal: Strength & Muscle Tone: within normal limits Gait & Station: normal Patient leans: N/A  Psychiatric Specialty Exam: Review of Systems  Psychiatric/Behavioral: The patient has insomnia (Restless due to an URI).   All other systems reviewed and are negative.   Blood pressure (!) 145/87, pulse 81, temperature 97.7 F (36.5 C), temperature source Oral, weight 112 lb 6.4 oz (51 kg).Body mass index is 21.95 kg/m.  General Appearance: Casual  Eye Contact:  Fair  Speech:  Clear and Coherent  Volume:  Normal  Mood:  Euthymic  Affect:  Appropriate  Thought Process:  Goal Directed and Descriptions of Associations: Intact  Orientation:  Full (Time, Place, and Person)  Thought Content: Logical   Suicidal Thoughts:  No  Homicidal Thoughts:  No  Memory:  Immediate;  Fair Recent;   Fair Remote;   Fair  Judgement:  Fair   Insight:  Fair  Psychomotor Activity:  Normal  Concentration:  Concentration: Fair and Attention Span: Fair  Recall:  [employer] of Knowledge: Fair  Language: Fair  Akathisia:  No  Handed:  Right  AIMS (if indicated): 0  Assets:  Communication Skills Desire for Improvement Social Support  ADL's:  Intact  Cognition: WNL  Sleep:  restless due to URI   Screenings: AIMS     Office Visit from 11/11/2017 in [facility] Total Score  0       Assessment and Plan: Stephanie [name] is a 16 year old Caucasian female, divorced, lives in [place], has a history of bipolar disorder, presented to the clinic today for a follow-up visit.  Patient is continues to do well on the current medication regimen.  Will continue plan as noted below.  Plan Bipolar disorder - improving Tegretol extended release 400 mg p.o. twice daily Tegretol level on 11/22/2017-8.6-therapeutic. Continue Depakote extended release 500 mg p.o. twice daily Depakote level on 11/23/2018 19-63-therapeutic Gabapentin 300 mg p.o. twice daily Vraylar 1.5 mg p.o. daily.  For anxiety disorder- improving Continue CBT She will continue therapy with [name].  For insomnia- stable Mirtazapine 15 mg p.o. nightly She also has hydroxyzine as needed for sleep.  Follow-up in clinic in 2 months or sooner if needed.  I have spent atleast  15 minutes face to face with patient today. More than 50 % of the time was spent for psychoeducation and supportive psychotherapy and care coordination.  This note was generated in part or whole with voice recognition software. Voice recognition is usually quite accurate but there are transcription errors that can and very often do occur. I apologize for any typographical errors that were not detected and corrected.            Ursula Alert, MD 03/25/2018, 11:27 AM

## 2018-05-13 ENCOUNTER — Ambulatory Visit
Admission: RE | Admit: 2018-05-13 | Discharge: 2018-05-13 | Disposition: A | Discharge status: Home / Self Care | Payer: Medicare Other | Source: Ambulatory Visit | Attending: Family Medicine | Admitting: Family Medicine

## 2018-05-13 DIAGNOSIS — Z1231 Encounter for screening mammogram for malignant neoplasm of breast: Secondary | ICD-10-CM | POA: Diagnosis present

## 2018-05-24 ENCOUNTER — Other Ambulatory Visit: Payer: Self-pay | Admitting: Psychiatry

## 2018-05-24 DIAGNOSIS — F3131 Bipolar disorder, current episode depressed, mild: Secondary | ICD-10-CM

## 2018-05-25 ENCOUNTER — Telehealth: Payer: Self-pay | Admitting: Psychiatry

## 2018-05-25 ENCOUNTER — Ambulatory Visit: Payer: Medicare Other | Admitting: Psychiatry

## 2018-05-25 MED ORDER — CARIPRAZINE HCL 1.5 MG PO CAPS: 1.5000 mg | ORAL_CAPSULE | Freq: Every day | ORAL | 0 refills | Status: DC

## 2018-05-25 NOTE — Telephone Encounter (Signed)
Per review of chart patient has enough supplies of all meds except Vraylar, will send it to pharmacy

## 2018-05-25 NOTE — Telephone Encounter (Signed)
pt called and states that she wants to try and get off the vrylar states it is too expensive and that she needs to use the money for something else.  she states she doesnt want to go threw the red tape of the patient assistant so she wants to see if there something else less cost and she wants to know the right way of getting off medication.

## 2018-05-25 NOTE — Telephone Encounter (Signed)
Spoke to patient , discussed taking Vraylar every other day and stop in after she runs out of her current supply. She wants to stop it since she is gaining weight and its too expensive for her. She will ask her friends and family to monitor her symptoms closely .

## 2018-06-09 ENCOUNTER — Other Ambulatory Visit: Payer: Self-pay | Admitting: Psychiatry

## 2018-06-09 DIAGNOSIS — F3110 Bipolar disorder, current episode manic without psychotic features, unspecified: Secondary | ICD-10-CM

## 2018-06-09 DIAGNOSIS — F5105 Insomnia due to other mental disorder: Secondary | ICD-10-CM

## 2018-06-20 ENCOUNTER — Other Ambulatory Visit: Payer: Self-pay | Admitting: Psychiatry

## 2018-06-20 DIAGNOSIS — F3131 Bipolar disorder, current episode depressed, mild: Secondary | ICD-10-CM

## 2018-09-09 ENCOUNTER — Ambulatory Visit: Payer: Self-pay

## 2018-09-09 NOTE — Telephone Encounter (Signed)
Pt. Reports sore throat since yesterday. Has fatigue and mild cough. Concerned about COVID 19. Her PCP is closed today. Will go to UC if symptoms worsen. Reviewed home remedies for sore throat.   Reason for Disposition . SEVERE (e.g., excruciating) throat pain  Answer Assessment - Initial Assessment Questions 1. ONSET: "When did the throat start hurting?" (Hours or days ago)      Started yesterday 2. SEVERITY: "How bad is the sore throat?" (Scale 1-10; mild, moderate or severe)   - MILD (1-3):  doesn't interfere with eating or normal activities   - MODERATE (4-7): interferes with eating some solids and normal activities   - SEVERE (8-10):  excruciating pain, interferes with most normal activities   - SEVERE DYSPHAGIA: can't swallow liquids, drooling     Moderate 3. STREP EXPOSURE: "Has there been any exposure to strep within the past week?" If so, ask: "What type of contact occurred?"      No 4.  VIRAL SYMPTOMS: "Are there any symptoms of a cold, such as a runny nose, cough, hoarse voice or red eyes?"      No 5. FEVER: "Do you have a fever?" If so, ask: "What is your temperature, how was it measured, and when did it start?"     Unsure 6. PUS ON THE TONSILS: "Is there pus on the tonsils in the back of your throat?"     No 7. OTHER SYMPTOMS: "Do you have any other symptoms?" (e.g., difficulty breathing, headache, rash)     Fatigue, mild cough 8. PREGNANCY: "Is there any chance you are pregnant?" "When was your last menstrual period?"     No  Protocols used: SORE THROAT-A-AH

## 2018-10-31 ENCOUNTER — Telehealth: Payer: Self-pay

## 2018-10-31 DIAGNOSIS — F5105 Insomnia due to other mental disorder: Secondary | ICD-10-CM

## 2018-10-31 DIAGNOSIS — F3110 Bipolar disorder, current episode manic without psychotic features, unspecified: Secondary | ICD-10-CM

## 2018-10-31 DIAGNOSIS — F3131 Bipolar disorder, current episode depressed, mild: Secondary | ICD-10-CM

## 2018-10-31 NOTE — Telephone Encounter (Signed)
Thanks, it appears that she has made an appointment with Dr. Shea Evans on 08/26. Recommend discussing med adjustment with Dr. Shea Evans during the appointment.

## 2018-10-31 NOTE — Telephone Encounter (Signed)
pt called states she needs a medication  adjustment. pt states that she also needed an appt.. pt was transfered to front desk to make an appointment for her.  Message being sent to both Lea,

## 2018-11-01 MED ORDER — MIRTAZAPINE 15 MG PO TABS: 15.0000 mg | ORAL_TABLET | Freq: Every day | ORAL | 1 refills | Status: DC

## 2018-11-01 MED ORDER — GABAPENTIN 300 MG PO CAPS: ORAL_CAPSULE | ORAL | 1 refills | Status: DC

## 2018-11-01 MED ORDER — CARBAMAZEPINE ER 200 MG PO TB12: 400.0000 mg | ORAL_TABLET | Freq: Two times a day (BID) | ORAL | 1 refills | Status: DC

## 2018-11-01 MED ORDER — HYDROXYZINE HCL 10 MG PO TABS: 10.0000 mg | ORAL_TABLET | Freq: Every evening | ORAL | 1 refills | Status: DC | PRN

## 2018-11-01 MED ORDER — DIVALPROEX SODIUM ER 500 MG PO TB24: ORAL_TABLET | ORAL | 1 refills | Status: DC

## 2018-11-01 NOTE — Telephone Encounter (Signed)
Spoke to patients , she is running low on vistaril. She wants her medication send to pharmacy. Otherwise doing ok. Talking to therapist . Wants [place] to stay in touch with therapist. Will ask Lea to mail in a consent form for release of information.

## 2018-11-01 NOTE — Telephone Encounter (Signed)
pt called and left a message that she still not able to go to sleep

## 2018-11-01 NOTE — Telephone Encounter (Signed)
Thank you :)

## 2018-11-02 ENCOUNTER — Ambulatory Visit (INDEPENDENT_AMBULATORY_CARE_PROVIDER_SITE_OTHER): Payer: Medicare Other | Admitting: Psychiatry

## 2018-11-02 ENCOUNTER — Other Ambulatory Visit: Payer: Self-pay

## 2018-11-02 ENCOUNTER — Encounter: Payer: Self-pay | Admitting: Psychiatry

## 2018-11-02 DIAGNOSIS — F411 Generalized anxiety disorder: Secondary | ICD-10-CM

## 2018-11-02 DIAGNOSIS — F3173 Bipolar disorder, in partial remission, most recent episode manic: Secondary | ICD-10-CM | POA: Diagnosis not present

## 2018-11-02 DIAGNOSIS — F3110 Bipolar disorder, current episode manic without psychotic features, unspecified: Secondary | ICD-10-CM | POA: Insufficient documentation

## 2018-11-02 DIAGNOSIS — F5105 Insomnia due to other mental disorder: Secondary | ICD-10-CM | POA: Insufficient documentation

## 2018-11-02 NOTE — Progress Notes (Signed)
Virtual Visit via Telephone Note  I connected with Stephanie [name] on 11/02/18 at  9:45 AM EDT by telephone and verified that I am speaking with the correct person using two identifiers.   I discussed the limitations, risks, security and privacy concerns of performing an evaluation and management service by telephone and the availability of in person appointments. I also discussed with the patient that there may be a patient responsible charge related to this service. The patient expressed understanding and agreed to proceed.    I discussed the assessment and treatment plan with the patient. The patient was provided an opportunity to ask questions and all were answered. The patient agreed with the plan and demonstrated an understanding of the instructions.   The patient was advised to call back or seek an in-person evaluation if the symptoms worsen or if the condition fails to improve as anticipated.   [facility] MD OP Progress Note  11/02/2018 6:07 PM KEARNEY BAJO  MRN:  [id number]  Chief Complaint:  Chief Complaint    Follow-up     HPI: Stephanie [name] is a 73 year old Caucasian female, divorced, [place], was evaluated by phone today.  Patient preferred to do a phone call.  Patient has a history of bipolar disorder, coronary artery disorder, Sjogren's syndrome, rheumatoid arthritis.  Patient today reports she has been coping okay with the current pandemic.  She reports she goes out for walks to get her exercise.  She can no longer go to the gym.  She has been able to go and get her groceries on senior citizen days twice a week.  She wears a mask and also has been social distancing.  She reports she has been compliant on all her medications except for regular.  She stopped taking the Vraylar since she was worried about side effects of weight gain and others.  She reports her mood symptoms so far has been okay.  She has not had any significant depression or manic symptoms recently.  Sleep was  restless a few weeks ago.  She however reports she has been sleeping better now.  Her primary provider added Allegra recently.  She reports she took the [place] and the hydroxyzine together last night and she slept very well.  Discussed with patient not to combine the Allegra and the hydroxyzine together since it can increase the side effect profile.  She agrees to do so.  If she does have sleep issues she will Biochemist, clinical know.  Patient denies any suicidality, homicidality or perceptual disturbances.  Patient reports she had some of her labs done by her primary care provider recently.  Patient was able to request lab reports from her PMD which was faxed to [place].  I have reviewed the most recent labs dated 09/01/2018-CBC with differential, CMP, lipid panel, TSH.  Patient will also benefit from Tegretol level and Depakote level.  We will order the same. Visit Diagnosis:    ICD-10-CM   1. Bipolar disorder, in partial remission, most recent episode manic (HCC)  F31.73 Carbamazepine level, total    Valproic acid level  2. GAD (generalized anxiety disorder)  F41.1   3. Insomnia due to mental disorder  F51.05     Past Psychiatric History: I have reviewed past psychiatric history from my progress note on 06/17/2017.  Past Medical History:  Past Medical History:  Diagnosis Date  . Anginal pain ([place])   . Anxiety   . Bipolar 1 disorder ([place])   . Bipolar disorder ([place])   .  Coronary artery disease   . Depression   . Fatigue   . Fibromyalgia   . GERD (gastroesophageal reflux disease)   . Heart disease   . Heart failure ([place])   . HLD (hyperlipidemia)   . HOH (hard of hearing)   . Hypothyroidism   . IBS (irritable bowel syndrome)   . Pelvic pain in female   . Perianal lesion    high grade sil lesion- keratinizing type  . RA (rheumatoid arthritis) ([place])   . Raynaud phenomenon   . Sjogren's disease ([place])   . Sleep apnea   . Vaginal Pap smear, abnormal     Past Surgical History:  Procedure  Laterality Date  . CATARACT EXTRACTION W/PHACO Left 02/23/2017   Procedure: CATARACT EXTRACTION PHACO AND INTRAOCULAR LENS PLACEMENT (IOC);  Surgeon: Birder Robson, MD;  Location: [facility] ORS;  Service: Ophthalmology;  Laterality: Left;  [place] 00:35 AP% 9.6 CDE 3.47 Fluid pack lot # [id number]  . CATARACT EXTRACTION W/PHACO Right 03/30/2017   Procedure: CATARACT EXTRACTION PHACO AND INTRAOCULAR LENS PLACEMENT ([place]);  Surgeon: Birder Robson, MD;  Location: [facility] ORS;  Service: Ophthalmology;  Laterality: Right;  [place] 00:32.6 AP% 13.8 CDE 4.51 Fluid Pack Lot # [id number]  . EYE SURGERY    . TONSILLECTOMY      Family Psychiatric History: I have reviewed family psychiatric history from my progress note on 06/17/2017  Family History:  Family History  Problem Relation Age of Onset  . Heart failure Father   . Parkinson's disease Mother   . Bipolar disorder Mother   . Depression Sister   . Schizophrenia Paternal Grandmother   . Diabetes Maternal Grandmother   . Pancreatic cancer Maternal Grandmother   . Cancer Neg Hx   . Heart disease Neg Hx   . Breast cancer Neg Hx     Social History: Reviewed social history from my progress note on 06/17/2017 Social History   Socioeconomic History  . Marital status: Divorced    Spouse name: Not on file  . Number of children: 2  . Years of education: Not on file  . Highest education level: Master's degree (e.g., MA, MS, MEng, MEd, MSW, MBA)  Occupational History    Comment: retired2  Social Needs  . Financial resource strain: Somewhat hard  . Food insecurity    Worry: Never true    Inability: Never true  . Transportation needs    Medical: No    Non-medical: No  Tobacco Use  . Smoking status: Never Smoker  . Smokeless tobacco: Former Network engineer and Sexual Activity  . Alcohol use: No    Alcohol/week: 0.0 standard drinks  . Drug use: No  . Sexual activity: Never    Birth control/protection: Post-menopausal  Lifestyle  . Physical  activity    Days per week: 0 days    Minutes per session: 0 min  . Stress: Only a little  Relationships  . Social Herbalist on phone: Not on file    Gets together: Not on file    Attends religious service: Never    Active member of club or organization: No    Attends meetings of clubs or organizations: Never    Relationship status: Divorced  Other Topics Concern  . Not on file  Social History Narrative  . Not on file    Allergies:  Allergies  Allergen Reactions  . Amoxicillin-Pot Clavulanate Nausea Only and Other (See Comments)    Diarrhea Has patient had a PCN reaction  causing immediate rash, facial/tongue/throat swelling, SOB or lightheadedness with hypotension:  Has patient had a PCN reaction causing severe rash involving mucus membranes or skin necrosis: Has patient had a PCN reaction that required hospitalization:  Has patient had a PCN reaction occurring within the last 10 years: If all of the above answers are "NO", then may proceed with Cephalosporin use.   Marland Kitchen Oxycodone-Acetaminophen Nausea Only  . Erythromycin Nausea Only  . Azithromycin Nausea And Vomiting  . Codeine   . Macrolides And Ketolides   . Oxycodone-Acetaminophen   . Penicillins Other (See Comments)    Has patient had a PCN reaction causing immediate rash, facial/tongue/throat swelling, SOB or lightheadedness with hypotension: NO Has patient had a PCN reaction causing severe rash involving mucus membranes or skin necrosis: NO Has patient had a PCN reaction that required hospitalization: NO Has patient had a PCN reaction occurring within the last 10 years: NO If all of the above answers are "NO", then may proceed with Cephalosporin use.   . Tramadol Nausea And Vomiting  . Lamotrigine Rash    LAMICTAL  . Sulfa Antibiotics Rash    Metabolic Disorder Labs: Lab Results  Component Value Date   HGBA1C 5.3 11/22/2017   Lab Results  Component Value Date   PROLACTIN 33.0 (H) 11/22/2017    Lab Results  Component Value Date   CHOL 219 (H) 11/22/2017   TRIG 105 11/22/2017   HDL 79 11/22/2017   LDLCALC 119 (H) 11/22/2017   No results found for: TSH  Therapeutic Level Labs: No results found for: LITHIUM Lab Results  Component Value Date   VALPROATE 63 11/22/2017   VALPROATE 59 05/13/2017   No components found for:  CBMZ  Current Medications: Current Outpatient Medications  Medication Sig Dispense Refill  . Abatacept (ORENCIA IV) Inject 1 Dose into the vein every 30 (thirty) days.    Marland Kitchen acyclovir (ZOVIRAX) 200 MG capsule     . aspirin 81 MG chewable tablet Chew 81 mg by mouth daily with breakfast.    . Calcium Citrate-Vitamin D ([employer] CALCIUM CITRATE +D3 MINI) 200-250 MG-UNIT TABS Take 1 tablet by mouth daily after breakfast.     . carbamazepine (TEGRETOL XR) 200 MG 12 hr tablet Take 2 tablets (400 mg total) by mouth 2 (two) times daily. 360 tablet 1  . divalproex (DEPAKOTE ER) 500 MG 24 hr tablet Take 1 tablet twice a day 180 tablet 1  . fluticasone (FLONASE) 50 MCG/ACT nasal spray     . folic acid (FOLVITE) 1 MG tablet Take 2 mg by mouth daily with breakfast.     . gabapentin (NEURONTIN) 300 MG capsule Take 1 tablet twice a day 180 capsule 1  . hydrOXYzine (ATARAX/VISTARIL) 10 MG tablet Take 1-2 tablets (10-20 mg total) by mouth at bedtime as needed. for sleep 180 tablet 1  . ibandronate (BONIVA) 150 MG tablet     . levothyroxine (SYNTHROID, LEVOTHROID) 75 MCG tablet TAKE 1 TABLET BY MOUTH EACH DAY ON EMPTYSTOMACH WITH A GLASS OF WATERAT LEAST 30 TO 60 MINUTES BEFORE BREAKFAST IN THE MORNING    . methotrexate 2.5 MG tablet Take 15 mg by mouth every Wednesday. WEDNESDAYS AT LUNCH    . mirtazapine (REMERON) 15 MG tablet Take 1 tablet (15 mg total) by mouth at bedtime. 90 tablet 1  . Multiple Vitamins-Minerals (MULTIVITAMIN WITH MINERALS) tablet Take 1 tablet by mouth daily with breakfast.     . nitroGLYCERIN (NITROSTAT) 0.4 MG SL tablet Place 0.4 mg under  the tongue  every 5 (five) minutes as needed for chest pain.     Marland Kitchen ondansetron (ZOFRAN) 8 MG tablet TAKE 1 TABLET BY MOUTH EVERY 12 HOURS ASNEEDED FOR NAUSEA    . Polyethyl Glycol-Propyl Glycol (SYSTANE) 0.4-0.3 % GEL ophthalmic gel Place 1 application into both eyes 3 (three) times daily as needed (typically twice daily). Equate Brand     . pravastatin (PRAVACHOL) 10 MG tablet Take 10 mg by mouth at bedtime.     . ranitidine (ZANTAC) 150 MG tablet Take 150 mg by mouth 2 (two) times daily. BREAKFAST & BEDTIME.    Marland Kitchen Zoledronic Acid (RECLAST IV) Inject 1 Dose into the vein.     No current facility-administered medications for this visit.      Musculoskeletal: Strength & Muscle Tone: UTA Gait & Station: Reports as WNL Patient leans: N/A  Psychiatric Specialty Exam: Review of Systems  Psychiatric/Behavioral: The patient is nervous/anxious.   All other systems reviewed and are negative.   There were no vitals taken for this visit.There is no height or weight on file to calculate BMI.  General Appearance: UTA  Eye Contact:  UTA  Speech:  Clear and Coherent  Volume:  Normal  Mood:  Anxious  Affect:  UTA  Thought Process:  Goal Directed and Descriptions of Associations: Intact  Orientation:  Full (Time, Place, and Person)  Thought Content: Logical   Suicidal Thoughts:  No  Homicidal Thoughts:  No  Memory:  Immediate;   Fair Recent;   Fair Remote;   Fair  Judgement:  Fair  Insight:  Fair  Psychomotor Activity:  UTA  Concentration:  Concentration: Fair and Attention Span: Fair  Recall:  [employer] of Knowledge: Fair  Language: Fair  Akathisia:  No  Handed:  Right  AIMS (if indicated): UTA  Assets:  Communication Skills Desire for Improvement Housing Social Support  ADL's:  Intact  Cognition: WNL  Sleep:  Improving   Screenings: AIMS     Office Visit from 11/11/2017 in [facility] Total Score  0       Assessment and Plan: Guyla is a  73 year old Caucasian female, divorced, lives in [place], has a history of bipolar disorder was evaluated by telemedicine today.  Patient is currently compliant with medications and has been able to cope with her current situational stressors okay.  She will continue to need medication management as well as psychotherapy sessions with her therapist [name].  Plan Bipolar disorder- stable Tegretol extended release 400 mg p.o. twice daily Tegretol level-11/22/2017-8.6-therapeutic Depakote extended release 500 mg p.o. twice daily Depakote level on 11/22/2017-63-therapeutic Gabapentin 300 mg p.o. twice daily  Will order Depakote and Tegretol level, mailed it to her.  GAD- improving Continue psychotherapy sessions with [name]. Patient to sign a release to obtain medical records from Ms. Grandville Silos.  For insomnia-improving Continue mirtazapine 15 mg p.o. nightly Hydroxyzine as needed for sleep.   I have reviewed the following labs received from her primary care provider dated 09/01/2018 CBC with differential- hemoglobin-15.2-slightly elevated, MCV- slightly elevated at 102.3, platelet count- low at 114- however patient does have a chronic history of low platelet count, her primary care provider will continue to monitor it closely. CMP-within normal limits except for glucose-slightly elevated at 117, TSH-within normal limits at 0.569, lipid panel-total cholesterol at 205-slightly elevated otherwise within normal limits.   Follow-up in clinic in 1 to 2 months or sooner if needed.  October 7 at  10 AM  I have spent atleast 15 minutes non face to face with patient today. More than 50 % of the time was spent for psychoeducation and supportive psychotherapy and care coordination.  This note was generated in part or whole with voice recognition software. Voice recognition is usually quite accurate but there are transcription errors that can and very often do occur. I apologize for any  typographical errors that were not detected and corrected.       Ursula Alert, MD 11/02/2018, 6:07 PM

## 2018-11-04 ENCOUNTER — Ambulatory Visit: Payer: Medicare Other | Admitting: Psychiatry

## 2018-11-09 LAB — CARBAMAZEPINE LEVEL, TOTAL: Carbamazepine (Tegretol), S: 7.5 ug/mL (ref 4.0–12.0)

## 2018-11-09 LAB — VALPROIC ACID LEVEL: Valproic Acid Lvl: 77 ug/mL (ref 50–100)

## 2018-12-14 ENCOUNTER — Encounter: Payer: Self-pay | Admitting: Psychiatry

## 2018-12-14 ENCOUNTER — Ambulatory Visit (INDEPENDENT_AMBULATORY_CARE_PROVIDER_SITE_OTHER): Payer: Medicare Other | Admitting: Psychiatry

## 2018-12-14 ENCOUNTER — Other Ambulatory Visit: Payer: Self-pay

## 2018-12-14 DIAGNOSIS — F5105 Insomnia due to other mental disorder: Secondary | ICD-10-CM | POA: Diagnosis not present

## 2018-12-14 DIAGNOSIS — F411 Generalized anxiety disorder: Secondary | ICD-10-CM

## 2018-12-14 DIAGNOSIS — F3173 Bipolar disorder, in partial remission, most recent episode manic: Secondary | ICD-10-CM | POA: Diagnosis not present

## 2018-12-14 NOTE — Progress Notes (Signed)
Virtual Visit via Telephone Note  I connected with Stephanie [name] on 12/14/18 at 10:00 AM EDT by telephone and verified that I am speaking with the correct person using two identifiers.   I discussed the limitations, risks, security and privacy concerns of performing an evaluation and management service by telephone and the availability of in person appointments. I also discussed with the patient that there may be a patient responsible charge related to this service. The patient expressed understanding and agreed to proceed.    I discussed the assessment and treatment plan with the patient. The patient was provided an opportunity to ask questions and all were answered. The patient agreed with the plan and demonstrated an understanding of the instructions.   The patient was advised to call back or seek an in-person evaluation if the symptoms worsen or if the condition fails to improve as anticipated.   [place] MD/PA/NP OP Progress Note  12/14/2018 3:22 PM Stephanie [name]  MRN:  [id number]  Chief Complaint:  Chief Complaint    Follow-up     HPI: Stephanie [name] is a 73 year old Caucasian female, divorced, lives in [name] was evaluated by phone today.  Patient with history of bipolar disorder, coronary artery disease, Sjogren's syndrome, rheumatoid arthritis.  Patient preferred to do a phone call.  Patient today reports she is currently doing well on the current medication regimen.  She denies any significant hypomanic or manic episodes.  She does not feel depressed.  She is anxious about the current pandemic however has been coping okay.  She reports sleep is good.  She continues to take her mirtazapine and hydroxyzine as needed.  She is compliant on her medications as prescribed.  Patient reports she has been making use of her support system like calling her daughter and talking to her friends often.  She also sees her therapist [name] every other month.  Reviewed and discussed labs with  patient-Depakote level and carbamazepine level- therapeutic.     Visit Diagnosis:    ICD-10-CM   1. Bipolar disorder, in partial remission, most recent episode manic (HCC)  F31.73   2. GAD (generalized anxiety disorder)  F41.1   3. Insomnia due to mental disorder  F51.05     Past Psychiatric History: I have reviewed past psychiatric history from my progress note on 06/17/2017  Past Medical History:  Past Medical History:  Diagnosis Date  . Anginal pain ([place])   . Anxiety   . Bipolar 1 disorder ([place])   . Bipolar disorder ([place])   . Coronary artery disease   . Depression   . Fatigue   . Fibromyalgia   . GERD (gastroesophageal reflux disease)   . Heart disease   . Heart failure ([place])   . HLD (hyperlipidemia)   . HOH (hard of hearing)   . Hypothyroidism   . IBS (irritable bowel syndrome)   . Pelvic pain in female   . Perianal lesion    high grade sil lesion- keratinizing type  . RA (rheumatoid arthritis) ([place])   . Raynaud phenomenon   . Sjogren's disease ([place])   . Sleep apnea   . Vaginal Pap smear, abnormal     Past Surgical History:  Procedure Laterality Date  . CATARACT EXTRACTION W/PHACO Left 02/23/2017   Procedure: CATARACT EXTRACTION PHACO AND INTRAOCULAR LENS PLACEMENT (IOC);  Surgeon: Birder Robson, MD;  Location: [facility] ORS;  Service: Ophthalmology;  Laterality: Left;  [place] 00:35 AP% 9.6 CDE 3.47 Fluid pack lot # [id number]  .  CATARACT EXTRACTION W/PHACO Right 03/30/2017   Procedure: CATARACT EXTRACTION PHACO AND INTRAOCULAR LENS PLACEMENT (IOC);  Surgeon: Birder Robson, MD;  Location: [facility] ORS;  Service: Ophthalmology;  Laterality: Right;  [place] 00:32.6 AP% 13.8 CDE 4.51 Fluid Pack Lot # [id number]  . EYE SURGERY    . TONSILLECTOMY      Family Psychiatric History: I have reviewed family psychiatric history from my progress note on 06/17/2017  Family History:  Family History  Problem Relation Age of Onset  . Heart failure Father   . Parkinson's disease  Mother   . Bipolar disorder Mother   . Depression Sister   . Schizophrenia Paternal Grandmother   . Diabetes Maternal Grandmother   . Pancreatic cancer Maternal Grandmother   . Cancer Neg Hx   . Heart disease Neg Hx   . Breast cancer Neg Hx     Social History: I have reviewed social history from my progress note on 06/17/2017 Social History   Socioeconomic History  . Marital status: Divorced    Spouse name: Not on file  . Number of children: 2  . Years of education: Not on file  . Highest education level: Master's degree (e.g., MA, MS, MEng, MEd, MSW, MBA)  Occupational History    Comment: retired2  Social Needs  . Financial resource strain: Somewhat hard  . Food insecurity    Worry: Never true    Inability: Never true  . Transportation needs    Medical: No    Non-medical: No  Tobacco Use  . Smoking status: Never Smoker  . Smokeless tobacco: Former Network engineer and Sexual Activity  . Alcohol use: No    Alcohol/week: 0.0 standard drinks  . Drug use: No  . Sexual activity: Never    Birth control/protection: Post-menopausal  Lifestyle  . Physical activity    Days per week: 0 days    Minutes per session: 0 min  . Stress: Only a little  Relationships  . Social Herbalist on phone: Not on file    Gets together: Not on file    Attends religious service: Never    Active member of club or organization: No    Attends meetings of clubs or organizations: Never    Relationship status: Divorced  Other Topics Concern  . Not on file  Social History Narrative  . Not on file    Allergies:  Allergies  Allergen Reactions  . Amoxicillin-Pot Clavulanate Nausea Only and Other (See Comments)    Diarrhea Has patient had a PCN reaction causing immediate rash, facial/tongue/throat swelling, SOB or lightheadedness with hypotension:  Has patient had a PCN reaction causing severe rash involving mucus membranes or skin necrosis: Has patient had a PCN reaction that  required hospitalization:  Has patient had a PCN reaction occurring within the last 10 years: If all of the above answers are "NO", then may proceed with Cephalosporin use.   Marland Kitchen Oxycodone-Acetaminophen Nausea Only  . Erythromycin Nausea Only  . Azithromycin Nausea And Vomiting  . Codeine   . Macrolides And Ketolides   . Oxycodone-Acetaminophen   . Penicillins Other (See Comments)    Has patient had a PCN reaction causing immediate rash, facial/tongue/throat swelling, SOB or lightheadedness with hypotension: NO Has patient had a PCN reaction causing severe rash involving mucus membranes or skin necrosis: NO Has patient had a PCN reaction that required hospitalization: NO Has patient had a PCN reaction occurring within the last 10 years: NO If all of the  above answers are "NO", then may proceed with Cephalosporin use.   . Tramadol Nausea And Vomiting  . Lamotrigine Rash    LAMICTAL  . Sulfa Antibiotics Rash    Metabolic Disorder Labs: Lab Results  Component Value Date   HGBA1C 5.3 11/22/2017   Lab Results  Component Value Date   PROLACTIN 33.0 (H) 11/22/2017   Lab Results  Component Value Date   CHOL 219 (H) 11/22/2017   TRIG 105 11/22/2017   HDL 79 11/22/2017   LDLCALC 119 (H) 11/22/2017   No results found for: TSH  Therapeutic Level Labs: No results found for: LITHIUM Lab Results  Component Value Date   VALPROATE 77 11/08/2018   VALPROATE 63 11/22/2017   No components found for:  CBMZ  Current Medications: Current Outpatient Medications  Medication Sig Dispense Refill  . Abatacept (ORENCIA IV) Inject 1 Dose into the vein every 30 (thirty) days.    Marland Kitchen acyclovir (ZOVIRAX) 200 MG capsule     . aspirin 81 MG chewable tablet Chew 81 mg by mouth daily with breakfast.    . Calcium Citrate-Vitamin D ([employer] CALCIUM CITRATE +D3 MINI) 200-250 MG-UNIT TABS Take 1 tablet by mouth daily after breakfast.     . carbamazepine (TEGRETOL XR) 200 MG 12 hr tablet Take 2 tablets (400  mg total) by mouth 2 (two) times daily. 360 tablet 1  . divalproex (DEPAKOTE ER) 500 MG 24 hr tablet Take 1 tablet twice a day 180 tablet 1  . fluticasone (FLONASE) 50 MCG/ACT nasal spray     . folic acid (FOLVITE) 1 MG tablet Take 2 mg by mouth daily with breakfast.     . gabapentin (NEURONTIN) 300 MG capsule Take 1 tablet twice a day 180 capsule 1  . hydrOXYzine (ATARAX/VISTARIL) 10 MG tablet Take 1-2 tablets (10-20 mg total) by mouth at bedtime as needed. for sleep 180 tablet 1  . ibandronate (BONIVA) 150 MG tablet     . levothyroxine (SYNTHROID, LEVOTHROID) 75 MCG tablet TAKE 1 TABLET BY MOUTH EACH DAY ON EMPTYSTOMACH WITH A GLASS OF WATERAT LEAST 30 TO 60 MINUTES BEFORE BREAKFAST IN THE MORNING    . methotrexate 2.5 MG tablet Take 15 mg by mouth every Wednesday. WEDNESDAYS AT LUNCH    . mirtazapine (REMERON) 15 MG tablet Take 1 tablet (15 mg total) by mouth at bedtime. 90 tablet 1  . Multiple Vitamins-Minerals (MULTIVITAMIN WITH MINERALS) tablet Take 1 tablet by mouth daily with breakfast.     . nitroGLYCERIN (NITROSTAT) 0.4 MG SL tablet Place 0.4 mg under the tongue every 5 (five) minutes as needed for chest pain.     Marland Kitchen ondansetron (ZOFRAN) 8 MG tablet TAKE 1 TABLET BY MOUTH EVERY 12 HOURS ASNEEDED FOR NAUSEA    . Polyethyl Glycol-Propyl Glycol (SYSTANE) 0.4-0.3 % GEL ophthalmic gel Place 1 application into both eyes 3 (three) times daily as needed (typically twice daily). Equate Brand     . pravastatin (PRAVACHOL) 10 MG tablet Take 10 mg by mouth at bedtime.     . pravastatin (PRAVACHOL) 20 MG tablet     . ranitidine (ZANTAC) 150 MG tablet Take 150 mg by mouth 2 (two) times daily. BREAKFAST & BEDTIME.    Marland Kitchen Zoledronic Acid (RECLAST IV) Inject 1 Dose into the vein.     No current facility-administered medications for this visit.      Musculoskeletal: Strength & Muscle Tone: UTA Gait & Station: Reports as WNL Patient leans: N/A  Psychiatric Specialty Exam: Review of  Systems   Psychiatric/Behavioral: Negative for depression, hallucinations, substance abuse and suicidal ideas. The patient is not nervous/anxious.   All other systems reviewed and are negative.   There were no vitals taken for this visit.There is no height or weight on file to calculate BMI.  General Appearance: UTA  Eye Contact:  UTA  Speech:  Clear and Coherent  Volume:  Normal  Mood:  Euthymic  Affect:  UTA  Thought Process:  Goal Directed and Descriptions of Associations: Intact  Orientation:  Full (Time, Place, and Person)  Thought Content: Logical   Suicidal Thoughts:  No  Homicidal Thoughts:  No  Memory:  Immediate;   Fair Recent;   Fair Remote;   Fair  Judgement:  Fair  Insight:  Fair  Psychomotor Activity:  UTA  Concentration:  Concentration: Fair and Attention Span: Fair  Recall:  [employer] of Knowledge: Fair  Language: Fair  Akathisia:  No  Handed:  Right  AIMS (if indicated): Denies tremors, rigidity  Assets:  Communication Skills Desire for Improvement Housing Social Support  ADL's:  Intact  Cognition: WNL  Sleep:  Fair   Screenings: AIMS     Office Visit from 11/11/2017 in [place] Total Score  0       Assessment and Plan: Charnette is a 73 year old Caucasian female, divorced, lives in [place], has a history of bipolar disorder was evaluated by telemedicine today.  Patient is currently doing well on the current medication regimen.  She will continue medications as well as psychotherapy sessions  Plan Bipolar disorder-stable Tegretol extended release 400 mg p.o. twice daily Tegretol level- 11/08/2018- 7.5- therapeutic Depakote extended release 500 mg p.o. twice daily Depakote level on- 11/08/2018 -77-therapeutic Gabapentin 300 mg p.o. twice daily   GAD-stable Continue psychotherapy sessions with [name].  Insomnia-stable Mirtazapine 15 mg p.o. nightly Hydroxyzine as needed for sleep  Reviewed and discussed the  following labs including Depakote level, Tegretol level as noted above as well as her most recent CMP and CBC with differential-dated 11/08/2018. Discussed with patient about low platelet-dated 12/05/2018 -she however has a history of chronic thrombocytopenia.  She reports she is currently working with her rheumatologist on the same.  Follow-up in clinic in 1 - 2 months or sooner if needed.  December 29 at 10 AM  I have spent atleast 15 minutes non  face to face with patient today. More than 50 % of the time was spent for psychoeducation and supportive psychotherapy and care coordination. This note was generated in part or whole with voice recognition software. Voice recognition is usually quite accurate but there are transcription errors that can and very often do occur. I apologize for any typographical errors that were not detected and corrected.       Ursula Alert, MD 12/14/2018, 3:22 PM

## 2018-12-26 ENCOUNTER — Telehealth: Payer: Self-pay

## 2018-12-26 NOTE — Telephone Encounter (Signed)
Ok we can fax it to [facility] on request of new codes .

## 2018-12-26 NOTE — Telephone Encounter (Signed)
pt called states that her labwork were not covered and that [facility] need the codes corrected.

## 2019-03-07 ENCOUNTER — Other Ambulatory Visit: Payer: Self-pay

## 2019-03-07 ENCOUNTER — Ambulatory Visit (INDEPENDENT_AMBULATORY_CARE_PROVIDER_SITE_OTHER): Payer: Medicare Other | Admitting: Psychiatry

## 2019-03-07 ENCOUNTER — Encounter: Payer: Self-pay | Admitting: Psychiatry

## 2019-03-07 DIAGNOSIS — F411 Generalized anxiety disorder: Secondary | ICD-10-CM | POA: Diagnosis not present

## 2019-03-07 DIAGNOSIS — F3174 Bipolar disorder, in full remission, most recent episode manic: Secondary | ICD-10-CM | POA: Diagnosis not present

## 2019-03-07 DIAGNOSIS — F5105 Insomnia due to other mental disorder: Secondary | ICD-10-CM

## 2019-03-07 NOTE — Progress Notes (Signed)
Virtual Visit via Telephone Note  I connected with Stephanie [name] on 03/07/19 at 10:00 AM EST by telephone and verified that I am speaking with the correct person using two identifiers.   I discussed the limitations, risks, security and privacy concerns of performing an evaluation and management service by telephone and the availability of in person appointments. I also discussed with the patient that there may be a patient responsible charge related to this service. The patient expressed understanding and agreed to proceed.      I discussed the assessment and treatment plan with the patient. The patient was provided an opportunity to ask questions and all were answered. The patient agreed with the plan and demonstrated an understanding of the instructions.   The patient was advised to call back or seek an in-person evaluation if the symptoms worsen or if the condition fails to improve as anticipated.   [facility] MD OP Progress Note  03/07/2019 5:16 PM Stephanie [name]  MRN:  [id number]  Chief Complaint:  Chief Complaint    Follow-up     HPI: Stephanie [name] is a 73 year old Caucasian female, divorced, lives in [place] was evaluated by phone today.  Patient has a history of bipolar disorder, coronary artery disease, Sjogren's syndrome, rheumatoid arthritis.  Patient today preferred to do a phone call.  Patient reports she is currently trying to cope with the pandemic as best as she can.  Patient is enrolled with the outreach program at [employer].  She reports she hence has someone coming to visit her a few times a month.  She reports she has access to a lot of books as well as movies.  Patient reports she has been reading out books and stories to her grandchildren over the phone.  She also has been keeping in touch with her friends.  She has been trying to get her exercise by going for walks.  She reports sleep is good on the current medication regimen.  Patient denies any significant mood  swings.  Patient denies any suicidality, homicidality or perceptual disturbances.  She continues to work with her therapist [name].  Patient denies any other concerns today. Visit Diagnosis:    ICD-10-CM   1. Bipolar disorder, in full remission, most recent episode manic ([place])  F31.74   2. GAD (generalized anxiety disorder)  F41.1   3. Insomnia due to mental disorder  F51.05     Past Psychiatric History: I have reviewed past psychiatric history from my progress note on 06/17/2017.  Past Medical History:  Past Medical History:  Diagnosis Date  . Anginal pain ([place])   . Anxiety   . Bipolar 1 disorder ([place])   . Bipolar disorder ([place])   . Coronary artery disease   . Depression   . Fatigue   . Fibromyalgia   . GERD (gastroesophageal reflux disease)   . Heart disease   . Heart failure ([place])   . HLD (hyperlipidemia)   . HOH (hard of hearing)   . Hypothyroidism   . IBS (irritable bowel syndrome)   . Pelvic pain in female   . Perianal lesion    high grade sil lesion- keratinizing type  . RA (rheumatoid arthritis) ([place])   . Raynaud phenomenon   . Sjogren's disease ([place])   . Sleep apnea   . Vaginal Pap smear, abnormal     Past Surgical History:  Procedure Laterality Date  . CATARACT EXTRACTION W/PHACO Left 02/23/2017   Procedure: CATARACT EXTRACTION PHACO AND INTRAOCULAR LENS  PLACEMENT (IOC);  Surgeon: Birder Robson, MD;  Location: [facility] ORS;  Service: Ophthalmology;  Laterality: Left;  [place] 00:35 AP% 9.6 CDE 3.47 Fluid pack lot # [id number]  . CATARACT EXTRACTION W/PHACO Right 03/30/2017   Procedure: CATARACT EXTRACTION PHACO AND INTRAOCULAR LENS PLACEMENT ([place]);  Surgeon: Birder Robson, MD;  Location: [facility] ORS;  Service: Ophthalmology;  Laterality: Right;  [place] 00:32.6 AP% 13.8 CDE 4.51 Fluid Pack Lot # [id number]  . EYE SURGERY    . TONSILLECTOMY      Family Psychiatric History: Reviewed family history from my progress note on 06/17/2017.  Family History:   Family History  Problem Relation Age of Onset  . Heart failure Father   . Parkinson's disease Mother   . Bipolar disorder Mother   . Depression Sister   . Schizophrenia Paternal Grandmother   . Diabetes Maternal Grandmother   . Pancreatic cancer Maternal Grandmother   . Cancer Neg Hx   . Heart disease Neg Hx   . Breast cancer Neg Hx     Social History: Reviewed social history from my progress note on 06/17/2017. Social History   Socioeconomic History  . Marital status: Divorced    Spouse name: Not on file  . Number of children: 2  . Years of education: Not on file  . Highest education level: Master's degree (e.g., MA, MS, MEng, MEd, MSW, MBA)  Occupational History    Comment: retired2  Tobacco Use  . Smoking status: Never Smoker  . Smokeless tobacco: Former Network engineer and Sexual Activity  . Alcohol use: No    Alcohol/week: 0.0 standard drinks  . Drug use: No  . Sexual activity: Never    Birth control/protection: Post-menopausal  Other Topics Concern  . Not on file  Social History Narrative  . Not on file   Social Determinants of Health   Financial Resource Strain:   . Difficulty of Paying Living Expenses: Not on file  Food Insecurity:   . Worried About Charity fundraiser in the Last Year: Not on file  . Ran Out of Food in the Last Year: Not on file  Transportation Needs:   . Lack of Transportation (Medical): Not on file  . Lack of Transportation (Non-Medical): Not on file  Physical Activity:   . Days of Exercise per Week: Not on file  . Minutes of Exercise per Session: Not on file  Stress:   . Feeling of Stress : Not on file  Social Connections:   . Frequency of Communication with Friends and Family: Not on file  . Frequency of Social Gatherings with Friends and Family: Not on file  . Attends Religious Services: Not on file  . Active Member of Clubs or Organizations: Not on file  . Attends Archivist Meetings: Not on file  . Marital Status:  Not on file    Allergies:  Allergies  Allergen Reactions  . Amoxicillin-Pot Clavulanate Nausea Only and Other (See Comments)    Diarrhea Has patient had a PCN reaction causing immediate rash, facial/tongue/throat swelling, SOB or lightheadedness with hypotension:  Has patient had a PCN reaction causing severe rash involving mucus membranes or skin necrosis: Has patient had a PCN reaction that required hospitalization:  Has patient had a PCN reaction occurring within the last 10 years: If all of the above answers are "NO", then may proceed with Cephalosporin use.   Marland Kitchen Oxycodone-Acetaminophen Nausea Only  . Erythromycin Nausea Only  . Azithromycin Nausea And Vomiting  . Codeine   .  Macrolides And Ketolides   . Oxycodone-Acetaminophen   . Penicillins Other (See Comments)    Has patient had a PCN reaction causing immediate rash, facial/tongue/throat swelling, SOB or lightheadedness with hypotension: NO Has patient had a PCN reaction causing severe rash involving mucus membranes or skin necrosis: NO Has patient had a PCN reaction that required hospitalization: NO Has patient had a PCN reaction occurring within the last 10 years: NO If all of the above answers are "NO", then may proceed with Cephalosporin use.   . Tramadol Nausea And Vomiting  . Lamotrigine Rash    LAMICTAL  . Sulfa Antibiotics Rash    Metabolic Disorder Labs: Lab Results  Component Value Date   HGBA1C 5.3 11/22/2017   Lab Results  Component Value Date   PROLACTIN 33.0 (H) 11/22/2017   Lab Results  Component Value Date   CHOL 219 (H) 11/22/2017   TRIG 105 11/22/2017   HDL 79 11/22/2017   LDLCALC 119 (H) 11/22/2017   No results found for: TSH  Therapeutic Level Labs: No results found for: LITHIUM Lab Results  Component Value Date   VALPROATE 77 11/08/2018   VALPROATE 63 11/22/2017   No components found for:  CBMZ  Current Medications: Current Outpatient Medications  Medication Sig Dispense Refill   . Abatacept (ORENCIA IV) Inject 1 Dose into the vein every 30 (thirty) days.    Marland Kitchen aspirin 81 MG chewable tablet Chew 81 mg by mouth daily with breakfast.    . benzonatate (TESSALON) 200 MG capsule TAKE 1 CAPSULE BY MOUTH 3 TIMES DAILY ASNEEDED FOR COUGH FOR UP TO SEVEN DAYS    . Calcium Citrate-Vitamin D ([employer] CALCIUM CITRATE +D3 MINI) 200-250 MG-UNIT TABS Take 1 tablet by mouth daily after breakfast.     . carbamazepine (TEGRETOL XR) 200 MG 12 hr tablet Take 2 tablets (400 mg total) by mouth 2 (two) times daily. 360 tablet 1  . divalproex (DEPAKOTE ER) 500 MG 24 hr tablet Take 1 tablet twice a day 180 tablet 1  . fluticasone (FLONASE) 50 MCG/ACT nasal spray     . folic acid (FOLVITE) 1 MG tablet Take 2 mg by mouth daily with breakfast.     . gabapentin (NEURONTIN) 300 MG capsule Take 1 tablet twice a day 180 capsule 1  . hydrOXYzine (ATARAX/VISTARIL) 10 MG tablet Take 1-2 tablets (10-20 mg total) by mouth at bedtime as needed. for sleep 180 tablet 1  . ibandronate (BONIVA) 150 MG tablet     . levothyroxine (SYNTHROID, LEVOTHROID) 75 MCG tablet TAKE 1 TABLET BY MOUTH EACH DAY ON EMPTYSTOMACH WITH A GLASS OF WATERAT LEAST 30 TO 60 MINUTES BEFORE BREAKFAST IN THE MORNING    . methotrexate 2.5 MG tablet Take 15 mg by mouth every Wednesday. WEDNESDAYS AT LUNCH    . mirtazapine (REMERON) 15 MG tablet Take 1 tablet (15 mg total) by mouth at bedtime. 90 tablet 1  . Multiple Vitamins-Minerals (MULTIVITAMIN WITH MINERALS) tablet Take 1 tablet by mouth daily with breakfast.     . nitroGLYCERIN (NITROSTAT) 0.4 MG SL tablet Place 0.4 mg under the tongue every 5 (five) minutes as needed for chest pain.     Marland Kitchen ondansetron (ZOFRAN) 8 MG tablet TAKE 1 TABLET BY MOUTH EVERY 12 HOURS ASNEEDED FOR NAUSEA    . phenazopyridine (PYRIDIUM) 100 MG tablet Take by mouth.    Vladimir Faster Glycol-Propyl Glycol (SYSTANE) 0.4-0.3 % GEL ophthalmic gel Place 1 application into both eyes 3 (three) times daily as needed (typically  twice daily). Equate Brand     . pravastatin (PRAVACHOL) 20 MG tablet     . acyclovir (ZOVIRAX) 200 MG capsule      No current facility-administered medications for this visit.     Musculoskeletal: Strength & Muscle Tone: UTA Gait & Station: Reports as WNL Patient leans: N/A  Psychiatric Specialty Exam: Review of Systems  Psychiatric/Behavioral: Negative for agitation, behavioral problems, confusion, decreased concentration, dysphoric mood, hallucinations, self-injury, sleep disturbance and suicidal ideas. The patient is not nervous/anxious and is not hyperactive.   All other systems reviewed and are negative.   There were no vitals taken for this visit.There is no height or weight on file to calculate BMI.  General Appearance: UTA  Eye Contact:  UTA  Speech:  Clear and Coherent  Volume:  Normal  Mood:  Euthymic  Affect:  Congruent  Thought Process:  Goal Directed and Descriptions of Associations: Intact  Orientation:  Full (Time, Place, and Person)  Thought Content: Logical   Suicidal Thoughts:  No  Homicidal Thoughts:  No  Memory:  Immediate;   Fair Recent;   Fair Remote;   Fair  Judgement:  Fair  Insight:  Fair  Psychomotor Activity:  UTA  Concentration:  Concentration: Fair and Attention Span: Fair  Recall:  [employer] of Knowledge: Fair  Language: Fair  Akathisia:  No  Handed:  Right  AIMS (if indicated): Denies tremors, rigidity  Assets:  Communication Skills Desire for Improvement Housing Social Support  ADL's:  Intact  Cognition: WNL  Sleep:  Fair   Screenings: AIMS     Office Visit from 11/11/2017 in [place] Total Score  0       Assessment and Plan: Stephanie [name] is a 21 year old Caucasian female, divorced, lives in [place], has a history of bipolar disorder was evaluated by phone today.  She is currently doing well on the current medication regimen.  Plan as noted below.  Plan Bipolar disorder in remission Tegretol  extended release 400 mg p.o. twice daily Tegretol level on 11/08/2018-7.5-therapeutic Depakote extended release 500 mg p.o. twice daily Depakote level on 11/08/2018-77-therapeutic Gabapentin 300 mg p.o. twice daily  GAD-stable Continue psychotherapy sessions with [name]  Insomnia-stable Mirtazapine 15 mg p.o. nightly Hydroxyzine 10 mg to 20 mg as needed for sleep.  Patient to continue psychotherapy sessions.  Follow-up in clinic in 3 months or sooner if needed.  March 24 at 10 AM  I have spent atleast 15 minutes non face to face with patient today. More than 50 % of the time was spent for psychoeducation and supportive psychotherapy and care coordination. This note was generated in part or whole with voice recognition software. Voice recognition is usually quite accurate but there are transcription errors that can and very often do occur. I apologize for any typographical errors that were not detected and corrected.       Ursula Alert, MD 03/07/2019, 5:16 PM

## 2019-03-24 ENCOUNTER — Other Ambulatory Visit: Payer: Self-pay | Admitting: Psychiatry

## 2019-03-24 DIAGNOSIS — F5105 Insomnia due to other mental disorder: Secondary | ICD-10-CM

## 2019-04-01 ENCOUNTER — Ambulatory Visit: Payer: Medicare PPO | Attending: Internal Medicine

## 2019-04-01 DIAGNOSIS — Z23 Encounter for immunization: Secondary | ICD-10-CM | POA: Insufficient documentation

## 2019-04-01 NOTE — Progress Notes (Signed)
   Covid-19 Vaccination Clinic  Name:  Stephanie [name]    MRN: [id number] DOB: September 14, 1945  04/01/2019  [name] was observed post Covid-19 immunization for 15 minutes without incidence. She was provided with Vaccine Information Sheet and instruction to access the V-Safe system.   [name] was instructed to call 911 with any severe reactions post vaccine: Marland Kitchen Difficulty breathing  . Swelling of your face and throat  . A fast heartbeat  . A bad rash all over your body  . Dizziness and weakness    Immunizations Administered    Name Date Dose VIS Date Route   Pfizer COVID-19 Vaccine 04/01/2019 11:09 AM 0.3 mL 02/17/2019 Intramuscular   Manufacturer: [place]   Lot: [id number]   [place]: [id number]

## 2019-04-07 ENCOUNTER — Other Ambulatory Visit: Payer: Self-pay | Admitting: Family Medicine

## 2019-04-07 DIAGNOSIS — Z1231 Encounter for screening mammogram for malignant neoplasm of breast: Secondary | ICD-10-CM

## 2019-04-22 ENCOUNTER — Ambulatory Visit: Payer: Medicare PPO

## 2019-04-28 ENCOUNTER — Ambulatory Visit: Payer: Medicare PPO

## 2019-04-30 ENCOUNTER — Ambulatory Visit: Payer: Medicare PPO | Attending: Internal Medicine

## 2019-04-30 DIAGNOSIS — Z23 Encounter for immunization: Secondary | ICD-10-CM | POA: Insufficient documentation

## 2019-04-30 NOTE — Progress Notes (Signed)
   Covid-19 Vaccination Clinic  Name:  MELEANA ENRIGHT    MRN: [id number] DOB: 1945-06-25  04/30/2019  Ms. Agers was observed post Covid-19 immunization for 15 minutes without incidence. She was provided with Vaccine Information Sheet and instruction to access the V-Safe system.   Ms. Primas was instructed to call 911 with any severe reactions post vaccine: Marland Kitchen Difficulty breathing  . Swelling of your face and throat  . A fast heartbeat  . A bad rash all over your body  . Dizziness and weakness    Immunizations Administered    Name Date Dose VIS Date Route   Pfizer COVID-19 Vaccine 04/30/2019  3:15 PM 0.3 mL 02/17/2019 Intramuscular   Manufacturer: [place]   Lot: [id number]   [place]: [id number]

## 2019-05-08 ENCOUNTER — Telehealth: Payer: Self-pay

## 2019-05-08 NOTE — Telephone Encounter (Signed)
Medication management - Telephone call with pt after she left a message her insurance changed from [facility] to [facility] with in increase in co-payment to $40 now for visits.  Pt. wanted to get this information to receptionist to prepare for upcoming appt 05/31/19.

## 2019-05-17 ENCOUNTER — Ambulatory Visit
Admission: RE | Admit: 2019-05-17 | Discharge: 2019-05-17 | Disposition: A | Discharge status: Home / Self Care | Payer: Medicare PPO | Source: Ambulatory Visit | Attending: Family Medicine | Admitting: Family Medicine

## 2019-05-17 DIAGNOSIS — Z1231 Encounter for screening mammogram for malignant neoplasm of breast: Secondary | ICD-10-CM

## 2019-05-31 ENCOUNTER — Encounter: Payer: Self-pay | Admitting: Psychiatry

## 2019-05-31 ENCOUNTER — Ambulatory Visit (INDEPENDENT_AMBULATORY_CARE_PROVIDER_SITE_OTHER): Payer: Medicare PPO | Admitting: Psychiatry

## 2019-05-31 ENCOUNTER — Other Ambulatory Visit: Payer: Self-pay

## 2019-05-31 DIAGNOSIS — F411 Generalized anxiety disorder: Secondary | ICD-10-CM

## 2019-05-31 DIAGNOSIS — F5105 Insomnia due to other mental disorder: Secondary | ICD-10-CM

## 2019-05-31 DIAGNOSIS — Z79899 Other long term (current) drug therapy: Secondary | ICD-10-CM

## 2019-05-31 DIAGNOSIS — F3174 Bipolar disorder, in full remission, most recent episode manic: Secondary | ICD-10-CM

## 2019-05-31 MED ORDER — MIRTAZAPINE 15 MG PO TABS: 15.0000 mg | ORAL_TABLET | Freq: Every day | ORAL | 1 refills | Status: DC

## 2019-05-31 MED ORDER — HYDROXYZINE HCL 10 MG PO TABS: 10.0000 mg | ORAL_TABLET | Freq: Every evening | ORAL | 1 refills | Status: DC | PRN

## 2019-05-31 MED ORDER — CARBAMAZEPINE ER 200 MG PO TB12: 400.0000 mg | ORAL_TABLET | Freq: Two times a day (BID) | ORAL | 1 refills | Status: DC

## 2019-05-31 MED ORDER — DIVALPROEX SODIUM ER 500 MG PO TB24: ORAL_TABLET | ORAL | 1 refills | Status: DC

## 2019-05-31 MED ORDER — GABAPENTIN 300 MG PO CAPS: ORAL_CAPSULE | ORAL | 1 refills | Status: DC

## 2019-05-31 NOTE — Progress Notes (Signed)
Provider Location : [facility] Patient Location : Home  Virtual Visit via Telephone Note  I connected with Stephanie [name] on 05/31/19 at 10:00 AM EDT by telephone and verified that I am speaking with the correct person using two identifiers.   I discussed the limitations, risks, security and privacy concerns of performing an evaluation and management service by telephone and the availability of in person appointments. I also discussed with the patient that there may be a patient responsible charge related to this service. The patient expressed understanding and agreed to proceed.    I discussed the assessment and treatment plan with the patient. The patient was provided an opportunity to ask questions and all were answered. The patient agreed with the plan and demonstrated an understanding of the instructions.   The patient was advised to call back or seek an in-person evaluation if the symptoms worsen or if the condition fails to improve as anticipated.   [place] MD OP Progress Note  05/31/2019 10:57 AM [name]  MRN:  [id number]  Chief Complaint:  Chief Complaint    Follow-up     HPI: Stephanie [name] is a 74 year old Caucasian female, divorced, lives in [place] was evaluated by phone today.  Patient with history of bipolar disorder, coronary artery disease, rheumatoid arthritis, Sjogren's syndrome.  Patient today reports she is currently doing okay.  She does have psychosocial stressors of the current pandemic and the social isolation.  She however reports she has been coping okay.  She tries to spend time with her friends when she can while social distancing.  She has been active, exercising regularly, doing tai chi at least 2-3 times a week.  She regularly follows up with her therapist [name] which is beneficial.  She reports she continues to get meals 5 days a week through [employer] which is very helpful for her and she loves it.  She is compliant on her medications as prescribed.   She denies any side effects.  She reports sleep overall is okay.  There are some nights once a week or so when she ruminates and is worried about things which can affect her sleep however she reports that she uses her coping techniques like saying memory versus from the Bible out loud to herself which helps.  She takes the mirtazapine and the hydroxyzine as needed which helps with sleep.  Patient denies any suicidality, homicidality or perceptual disturbances.  Patient reports she has not had any significant changes in her medications except for a medication that was prescribed for dry skin by dermatology as well as meloxicam.  Overall she is doing okay healthwise.  Patient reports she got her COVID-19 vaccine and is planning to spend some time with her son and daughter in April once they are also fully vaccinated.  She looks forward to that.  Patient denies any other concerns today. Visit Diagnosis:    ICD-10-CM   1. Bipolar disorder, in full remission, most recent episode manic (HCC)  F31.74 carbamazepine (TEGRETOL XR) 200 MG 12 hr tablet    divalproex (DEPAKOTE ER) 500 MG 24 hr tablet    gabapentin (NEURONTIN) 300 MG capsule  2. GAD (generalized anxiety disorder)  F41.1 hydrOXYzine (ATARAX/VISTARIL) 10 MG tablet  3. High risk medication use  Z79.899 Valproic acid level    Carbamazepine level, total    CBC With Differential    CMP and Liver  4. Insomnia due to mental disorder  F51.05 mirtazapine (REMERON) 15 MG tablet  Past Psychiatric History: I have reviewed past psychiatric history from my progress note on 06/17/2017.  Past Medical History:  Past Medical History:  Diagnosis Date  . Anginal pain ([place])   . Anxiety   . Bipolar 1 disorder ([place])   . Bipolar disorder ([place])   . Coronary artery disease   . Depression   . Fatigue   . Fibromyalgia   . GERD (gastroesophageal reflux disease)   . Heart disease   . Heart failure ([place])   . HLD (hyperlipidemia)   . HOH (hard of  hearing)   . Hypothyroidism   . IBS (irritable bowel syndrome)   . Pelvic pain in female   . Perianal lesion    high grade sil lesion- keratinizing type  . RA (rheumatoid arthritis) ([place])   . Raynaud phenomenon   . Sjogren's disease ([place])   . Sleep apnea   . Vaginal Pap smear, abnormal     Past Surgical History:  Procedure Laterality Date  . CATARACT EXTRACTION W/PHACO Left 02/23/2017   Procedure: CATARACT EXTRACTION PHACO AND INTRAOCULAR LENS PLACEMENT (IOC);  Surgeon: Birder Robson, MD;  Location: [facility] ORS;  Service: Ophthalmology;  Laterality: Left;  [place] 00:35 AP% 9.6 CDE 3.47 Fluid pack lot # [id number]  . CATARACT EXTRACTION W/PHACO Right 03/30/2017   Procedure: CATARACT EXTRACTION PHACO AND INTRAOCULAR LENS PLACEMENT ([place]);  Surgeon: Birder Robson, MD;  Location: [facility] ORS;  Service: Ophthalmology;  Laterality: Right;  [place] 00:32.6 AP% 13.8 CDE 4.51 Fluid Pack Lot # [id number]  . EYE SURGERY    . TONSILLECTOMY      Family Psychiatric History: I have reviewed family psychiatric history from my progress note on 06/17/2017  Family History:  Family History  Problem Relation Age of Onset  . Heart failure Father   . Parkinson's disease Mother   . Bipolar disorder Mother   . Depression Sister   . Schizophrenia Paternal Grandmother   . Diabetes Maternal Grandmother   . Pancreatic cancer Maternal Grandmother   . Cancer Neg Hx   . Heart disease Neg Hx   . Breast cancer Neg Hx     Social History: Reviewed social history from my progress note on 06/17/2017 Social History   Socioeconomic History  . Marital status: Divorced    Spouse name: Not on file  . Number of children: 2  . Years of education: Not on file  . Highest education level: Master's degree (e.g., MA, MS, MEng, MEd, MSW, MBA)  Occupational History    Comment: retired2  Tobacco Use  . Smoking status: Never Smoker  . Smokeless tobacco: Former Network engineer and Sexual Activity  . Alcohol use: No     Alcohol/week: 0.0 standard drinks  . Drug use: No  . Sexual activity: Never    Birth control/protection: Post-menopausal  Other Topics Concern  . Not on file  Social History Narrative  . Not on file   Social Determinants of Health   Financial Resource Strain:   . Difficulty of Paying Living Expenses:   Food Insecurity:   . Worried About Charity fundraiser in the Last Year:   . Arboriculturist in the Last Year:   Transportation Needs:   . Film/video editor (Medical):   Marland Kitchen Lack of Transportation (Non-Medical):   Physical Activity:   . Days of Exercise per Week:   . Minutes of Exercise per Session:   Stress:   . Feeling of Stress :   Social Connections:   .  Frequency of Communication with Friends and Family:   . Frequency of Social Gatherings with Friends and Family:   . Attends Religious Services:   . Active Member of Clubs or Organizations:   . Attends Archivist Meetings:   Marland Kitchen Marital Status:     Allergies:  Allergies  Allergen Reactions  . Amoxicillin-Pot Clavulanate Nausea Only and Other (See Comments)    Diarrhea Has patient had a PCN reaction causing immediate rash, facial/tongue/throat swelling, SOB or lightheadedness with hypotension:  Has patient had a PCN reaction causing severe rash involving mucus membranes or skin necrosis: Has patient had a PCN reaction that required hospitalization:  Has patient had a PCN reaction occurring within the last 10 years: If all of the above answers are "NO", then may proceed with Cephalosporin use.   Marland Kitchen Oxycodone-Acetaminophen Nausea Only  . Erythromycin Nausea Only  . Azithromycin Nausea And Vomiting  . Codeine   . Macrolides And Ketolides   . Oxycodone-Acetaminophen   . Penicillins Other (See Comments)    Has patient had a PCN reaction causing immediate rash, facial/tongue/throat swelling, SOB or lightheadedness with hypotension: NO Has patient had a PCN reaction causing severe rash involving mucus membranes  or skin necrosis: NO Has patient had a PCN reaction that required hospitalization: NO Has patient had a PCN reaction occurring within the last 10 years: NO If all of the above answers are "NO", then may proceed with Cephalosporin use.   . Tramadol Nausea And Vomiting  . Lamotrigine Rash    LAMICTAL  . Sulfa Antibiotics Rash    Metabolic Disorder Labs: Lab Results  Component Value Date   HGBA1C 5.3 11/22/2017   Lab Results  Component Value Date   PROLACTIN 33.0 (H) 11/22/2017   Lab Results  Component Value Date   CHOL 219 (H) 11/22/2017   TRIG 105 11/22/2017   HDL 79 11/22/2017   LDLCALC 119 (H) 11/22/2017   No results found for: TSH  Therapeutic Level Labs: No results found for: LITHIUM Lab Results  Component Value Date   VALPROATE 77 11/08/2018   VALPROATE 63 11/22/2017   No components found for:  CBMZ  Current Medications: Current Outpatient Medications  Medication Sig Dispense Refill  . Abatacept (ORENCIA IV) Inject 1 Dose into the vein every 30 (thirty) days.    Marland Kitchen acyclovir (ZOVIRAX) 200 MG capsule     . aspirin 81 MG chewable tablet Chew 81 mg by mouth daily with breakfast.    . benzonatate (TESSALON) 200 MG capsule TAKE 1 CAPSULE BY MOUTH 3 TIMES DAILY ASNEEDED FOR COUGH FOR UP TO SEVEN DAYS    . Calcium Citrate-Vitamin D ([employer] CALCIUM CITRATE +D3 MINI) 200-250 MG-UNIT TABS Take 1 tablet by mouth daily after breakfast.     . carbamazepine (TEGRETOL XR) 200 MG 12 hr tablet Take 2 tablets (400 mg total) by mouth 2 (two) times daily. 360 tablet 1  . divalproex (DEPAKOTE ER) 500 MG 24 hr tablet Take 1 tablet twice a day 180 tablet 1  . fluticasone (FLONASE) 50 MCG/ACT nasal spray     . folic acid (FOLVITE) 1 MG tablet Take 2 mg by mouth daily with breakfast.     . gabapentin (NEURONTIN) 300 MG capsule Take 1 tablet twice a day 180 capsule 1  . hydrOXYzine (ATARAX/VISTARIL) 10 MG tablet Take 1-2 tablets (10-20 mg total) by mouth at bedtime as needed. for sleep 180  tablet 1  . ibandronate (BONIVA) 150 MG tablet     .  levothyroxine (SYNTHROID, LEVOTHROID) 75 MCG tablet TAKE 1 TABLET BY MOUTH EACH DAY ON EMPTYSTOMACH WITH A GLASS OF WATERAT LEAST 30 TO 60 MINUTES BEFORE BREAKFAST IN THE MORNING    . meloxicam (MOBIC) 15 MG tablet     . methotrexate 2.5 MG tablet Take 15 mg by mouth every Wednesday. WEDNESDAYS AT LUNCH    . mirtazapine (REMERON) 15 MG tablet Take 1 tablet (15 mg total) by mouth at bedtime. 90 tablet 1  . mometasone (ELOCON) 0.1 % ointment Apply to fissure of right thumb BID until healed.    . Multiple Vitamins-Minerals (MULTIVITAMIN WITH MINERALS) tablet Take 1 tablet by mouth daily with breakfast.     . nitroGLYCERIN (NITROSTAT) 0.4 MG SL tablet Place 0.4 mg under the tongue every 5 (five) minutes as needed for chest pain.     Marland Kitchen ondansetron (ZOFRAN) 8 MG tablet TAKE 1 TABLET BY MOUTH EVERY 12 HOURS ASNEEDED FOR NAUSEA    . Polyethyl Glycol-Propyl Glycol (SYSTANE) 0.4-0.3 % GEL ophthalmic gel Place 1 application into both eyes 3 (three) times daily as needed (typically twice daily). Equate Brand     . pravastatin (PRAVACHOL) 20 MG tablet      No current facility-administered medications for this visit.     Musculoskeletal: Strength & Muscle Tone: UTA Gait & Station: Reports as WNL Patient leans: N/A  Psychiatric Specialty Exam: Review of Systems  Psychiatric/Behavioral: The patient is nervous/anxious (coping well).   All other systems reviewed and are negative.   There were no vitals taken for this visit.There is no height or weight on file to calculate BMI.  General Appearance: UTA  Eye Contact:  UTA  Speech:  Clear and Coherent  Volume:  Normal  Mood:  Anxious Coping well  Affect:  UTA  Thought Process:  Goal Directed and Descriptions of Associations: Intact  Orientation:  Full (Time, Place, and Person)  Thought Content: Logical   Suicidal Thoughts:  No  Homicidal Thoughts:  No  Memory:  Immediate;   Fair Recent;    Fair Remote;   Fair  Judgement:  Fair  Insight:  Fair  Psychomotor Activity:  UTA  Concentration:  Concentration: Fair and Attention Span: Fair  Recall:  [employer] of Knowledge: Fair  Language: Fair  Akathisia:  No  Handed:  Right  AIMS (if indicated): UTA  Assets:  Communication Skills Desire for Improvement Housing Social Support Talents/Skills  ADL's:  Intact  Cognition: WNL  Sleep:  Fair   Screenings: AIMS     Office Visit from 11/11/2017 in [facility] Total Score  0       Assessment and Plan: Gisel is a 74 year old Caucasian female, divorced, lives in [place], has a history of bipolar disorder was evaluated by phone today.  Patient is currently doing well on the current medication regimen.  Plan as noted below.  Plan Bipolar disorder in remission Tegretol extended release 400 mg p.o. twice daily Tegretol level on 11/08/2018-7.5-therapeutic Depakote extended release 500 mg p.o. twice daily Depakote level on 11/08/2018-77-therapeutic Gabapentin 300 mg p.o. twice daily  GAD-stable Continue psychotherapy sessions with [name]. Gabapentin 300 mg p.o. twice daily  Insomnia-stable Mirtazapine 15 mg p.o. nightly Hydroxyzine 10 mg to 20 mg as needed for sleep.  Patient to continue psychotherapy sessions.  We will order the following labs-Depakote level, carbamazepine level, CBC with differential, CMP.  Patient will go to [facility].  Follow-up in clinic in 3 months or sooner if needed.  I have spent atleast 30 minutes non face to face with patient today. More than 50 % of the time was spent for preparing to see the patient ( e.g., review of test, records ), obtaining and to review and separately obtained history , ordering medications and test ,psychoeducation and supportive psychotherapy and care coordination,as well as documenting clinical information in electronic health record. This note was generated in part or whole with  voice recognition software. Voice recognition is usually quite accurate but there are transcription errors that can and very often do occur. I apologize for any typographical errors that were not detected and corrected.         Ursula Alert, MD 05/31/2019, 10:57 AM

## 2019-06-06 ENCOUNTER — Telehealth: Payer: Self-pay

## 2019-06-06 NOTE — Telephone Encounter (Signed)
Thank you for the information.

## 2019-06-06 NOTE — Telephone Encounter (Signed)
FYI msg - Patient called to let you know that her ENT doctor has put her on Meloxicam which is a new medication for her. She's been having extreme dizziness and has had a bad fall since being on the medication. Therefore, she's going off it per pharmacist. Patient stated that it's her first fall since a year. Pharmacy suggested Tylenol extra strength rapid release gel tabs as an alternative. Thank you.

## 2019-06-06 NOTE — Telephone Encounter (Signed)
Faxed patient's labs to [facility] on [address] in [place]

## 2019-06-28 ENCOUNTER — Telehealth: Payer: Self-pay

## 2019-06-28 NOTE — Telephone Encounter (Signed)
Thank you :)

## 2019-06-28 NOTE — Telephone Encounter (Signed)
You're welcome!

## 2019-06-28 NOTE — Telephone Encounter (Signed)
I also relayed message about going to ER or contact PCP if she thinks it's serious. She doesn't think it is at this time

## 2019-06-29 ENCOUNTER — Telehealth: Payer: Self-pay

## 2019-06-29 NOTE — Telephone Encounter (Signed)
Stephanie [name] , this patient is what we discussed this AM. Please call her and let her know medication changes needs to be done only after evaluation - video or in person. We are currently not offering in person visits . She can got to ED or PMD also for an evaluation . Unable to comment what the movement problems are coming from without an evaluation.

## 2019-06-29 NOTE — Telephone Encounter (Signed)
pt states she seen [place] and she noticed that she was licking her lips and mouth movement  yesterday and she noted it again today.  she is concerned that medication is causing.  pt is going out of town and wanted to get something done before her trip.  Pt also needs to speak with you taking her lab-work and if she suppose to stop her medication until her lab-work done.

## 2019-06-30 ENCOUNTER — Telehealth: Payer: Self-pay

## 2019-06-30 NOTE — Telephone Encounter (Signed)
Ok , let me know once she is evaluated and she needs more assistance.

## 2019-07-03 NOTE — Telephone Encounter (Signed)
Ok.. will let you know. She has the evaluation with her PCP, Dr. Baldemar Lenis, scheduled on 07/06/19.

## 2019-07-03 NOTE — Telephone Encounter (Signed)
Thank you :)

## 2019-07-03 NOTE — Telephone Encounter (Signed)
Your welcome.

## 2019-07-06 LAB — CBC WITH DIFFERENTIAL
Basophils Absolute: 0 10*3/uL (ref 0.0–0.2)
Basos: 1 %
EOS (ABSOLUTE): 0.3 10*3/uL (ref 0.0–0.4)
Eos: 7 %
Hematocrit: 41.8 % (ref 34.0–46.6)
Hemoglobin: 14 g/dL (ref 11.1–15.9)
Immature Grans (Abs): 0 10*3/uL (ref 0.0–0.1)
Immature Granulocytes: 0 %
Lymphocytes Absolute: 2.1 10*3/uL (ref 0.7–3.1)
Lymphs: 47 %
MCH: 34.7 pg — ABNORMAL HIGH (ref 26.6–33.0)
MCHC: 33.5 g/dL (ref 31.5–35.7)
MCV: 104 fL — ABNORMAL HIGH (ref 79–97)
Monocytes Absolute: 0.4 10*3/uL (ref 0.1–0.9)
Monocytes: 9 %
Neutrophils Absolute: 1.6 10*3/uL (ref 1.4–7.0)
Neutrophils: 36 %
RBC: 4.03 x10E6/uL (ref 3.77–5.28)
RDW: 13.6 % (ref 11.7–15.4)
WBC: 4.5 10*3/uL (ref 3.4–10.8)

## 2019-07-06 LAB — CMP AND LIVER
ALT: 13 IU/L (ref 0–32)
AST: 23 IU/L (ref 0–40)
Albumin: 4 g/dL (ref 3.7–4.7)
Alkaline Phosphatase: 57 IU/L (ref 39–117)
BUN: 17 mg/dL (ref 8–27)
Bilirubin Total: 0.2 mg/dL (ref 0.0–1.2)
Bilirubin, Direct: 0.09 mg/dL (ref 0.00–0.40)
CO2: 25 mmol/L (ref 20–29)
Calcium: 9.3 mg/dL (ref 8.7–10.3)
Chloride: 102 mmol/L (ref 96–106)
Creatinine, Ser: 0.54 mg/dL — ABNORMAL LOW (ref 0.57–1.00)
GFR calc Af Amer: 108 mL/min/{1.73_m2} (ref >59)
GFR calc non Af Amer: 93 mL/min/{1.73_m2} (ref >59)
Glucose: 86 mg/dL (ref 65–99)
Potassium: 4.8 mmol/L (ref 3.5–5.2)
Sodium: 141 mmol/L (ref 134–144)
Total Protein: 6 g/dL (ref 6.0–8.5)

## 2019-07-06 LAB — CARBAMAZEPINE LEVEL, TOTAL: Carbamazepine (Tegretol), S: 8.9 ug/mL (ref 4.0–12.0)

## 2019-07-06 LAB — VALPROIC ACID LEVEL: Valproic Acid Lvl: 61 ug/mL (ref 50–100)

## 2019-07-10 NOTE — Telephone Encounter (Signed)
pt left message that she completed her labwork.

## 2019-07-11 NOTE — Telephone Encounter (Signed)
I have reviewed labs - Carbamazepine and valproic acid levels - are good. However - MCV, MCH - slightly high , unknown if significant. Will advice to follow up with Primary care. CMA to fax lab report to PMD.

## 2019-07-11 NOTE — Telephone Encounter (Signed)
pt called and given result and was told that labwork would be faxed to pcp

## 2019-07-18 NOTE — Progress Notes (Signed)
07/19/19 2:18 PM   Stephanie [name] 13-Oct-1945 UK:[id number]  Referring provider: Derinda Late, MD [phone] [address] [facility] [place],  [place] [address] Chief Complaint  Patient presents with  . Urinary Frequency    HPI: Stephanie [name] is a 74 y.o. F who presents today for the evaluation and management of urinary frequency.   Visited PCP on 06/20/19 c/o of urinary urgency w/ some incontinence. No dysuria. UA from visit revealed few bacteria and trace leuk on dip otherwise unremarkable. She was treated with cipro x 3 days. Associated cath urine culture negative.   Today, she reports of frequency q 15-20 min and urge incontinence onset several months. She notes of having an urge to urinate when she pulls up in her driveway attributing to psychological involuntary relationship. She states of wearing undergarments which becomes saturated when she cannot reach the bathroom in time. She does not know if she leaks when she laughs, coughs, and sneezes. She drinks plenty of water (8 oz glass after each meal) and 1 cup of coffee in the morning.   PVR 73 mL.   She denies issues w/ constipation.  Her mother had multiple UTI infections, fainting, and urgency.    PMH: Past Medical History:  Diagnosis Date  . Anginal pain ([place])   . Anxiety   . Bipolar 1 disorder ([place])   . Bipolar disorder ([place])   . Coronary artery disease   . Depression   . Fatigue   . Fibromyalgia   . GERD (gastroesophageal reflux disease)   . Heart disease   . Heart failure ([place])   . HLD (hyperlipidemia)   . HOH (hard of hearing)   . Hypothyroidism   . IBS (irritable bowel syndrome)   . Pelvic pain in female   . Perianal lesion    high grade sil lesion- keratinizing type  . RA (rheumatoid arthritis) ([place])   . Raynaud phenomenon   . Sjogren's disease ([place])   . Sleep apnea   . Vaginal Pap smear, abnormal     Surgical History: Past Surgical History:  Procedure Laterality  Date  . CATARACT EXTRACTION W/PHACO Left 02/23/2017   Procedure: CATARACT EXTRACTION PHACO AND INTRAOCULAR LENS PLACEMENT (IOC);  Surgeon: Birder Robson, MD;  Location: [facility] ORS;  Service: Ophthalmology;  Laterality: Left;  [place] 00:35 AP% 9.6 CDE 3.47 Fluid pack lot # [id number]  . CATARACT EXTRACTION W/PHACO Right 03/30/2017   Procedure: CATARACT EXTRACTION PHACO AND INTRAOCULAR LENS PLACEMENT ([place]);  Surgeon: Birder Robson, MD;  Location: [facility] ORS;  Service: Ophthalmology;  Laterality: Right;  [place] 00:32.6 AP% 13.8 CDE 4.51 Fluid Pack Lot # [id number]  . EYE SURGERY    . TONSILLECTOMY      Home Medications:  Allergies as of 07/19/2019      Reactions   Amoxicillin-pot Clavulanate Nausea Only, Other (See Comments)   Diarrhea Has patient had a PCN reaction causing immediate rash, facial/tongue/throat swelling, SOB or lightheadedness with hypotension:  Has patient had a PCN reaction causing severe rash involving mucus membranes or skin necrosis: Has patient had a PCN reaction that required hospitalization:  Has patient had a PCN reaction occurring within the last 10 years: If all of the above answers are "NO", then may proceed with Cephalosporin use.   Oxycodone-acetaminophen Nausea Only   Erythromycin Nausea Only   Azithromycin Nausea And Vomiting   Codeine    Macrolides And Ketolides    Oxycodone-acetaminophen    Penicillins Other (See  Comments)   Has patient had a PCN reaction causing immediate rash, facial/tongue/throat swelling, SOB or lightheadedness with hypotension: NO Has patient had a PCN reaction causing severe rash involving mucus membranes or skin necrosis: NO Has patient had a PCN reaction that required hospitalization: NO Has patient had a PCN reaction occurring within the last 10 years: NO If all of the above answers are "NO", then may proceed with Cephalosporin use.   Tramadol Nausea And Vomiting   Lamotrigine Rash   LAMICTAL   Sulfa Antibiotics Rash       Medication List       Accurate as of Jul 19, 2019  2:18 PM. If you have any questions, ask your nurse or doctor.        acyclovir 200 MG capsule Commonly known as: ZOVIRAX   aspirin 81 MG chewable tablet Chew 81 mg by mouth daily with breakfast.   benzonatate 200 MG capsule Commonly known as: TESSALON TAKE 1 CAPSULE BY MOUTH 3 TIMES DAILY ASNEEDED FOR COUGH FOR UP TO SEVEN DAYS   carbamazepine 200 MG 12 hr tablet Commonly known as: TEGRETOL XR Take 2 tablets (400 mg total) by mouth 2 (two) times daily.   [facility] Calcium Citrate +D3 Mini 200-250 MG-UNIT Tabs Generic drug: Calcium Citrate-Vitamin D Take 1 tablet by mouth daily after breakfast.   divalproex 500 MG 24 hr tablet Commonly known as: DEPAKOTE ER Take 1 tablet twice a day   fluticasone 50 MCG/ACT nasal spray Commonly known as: FLONASE   folic acid 1 MG tablet Commonly known as: FOLVITE Take 2 mg by mouth daily with breakfast.   gabapentin 300 MG capsule Commonly known as: NEURONTIN Take 1 tablet twice a day   hydrOXYzine 10 MG tablet Commonly known as: ATARAX/VISTARIL Take 1-2 tablets (10-20 mg total) by mouth at bedtime as needed. for sleep   ibandronate 150 MG tablet Commonly known as: BONIVA   levothyroxine 75 MCG tablet Commonly known as: SYNTHROID TAKE 1 TABLET BY MOUTH EACH DAY ON EMPTYSTOMACH WITH A GLASS OF WATERAT LEAST 30 TO 60 MINUTES BEFORE BREAKFAST IN THE MORNING   meloxicam 15 MG tablet Commonly known as: MOBIC   methotrexate 2.5 MG tablet Take 15 mg by mouth every Wednesday. WEDNESDAYS AT LUNCH   mirtazapine 15 MG tablet Commonly known as: REMERON Take 1 tablet (15 mg total) by mouth at bedtime.   mometasone 0.1 % ointment Commonly known as: ELOCON Apply to fissure of right thumb BID until healed.   multivitamin with minerals tablet Take 1 tablet by mouth daily with breakfast.   Nitrostat 0.4 MG SL tablet Generic drug: nitroGLYCERIN Place 0.4 mg under the tongue every 5 (five)  minutes as needed for chest pain.   ondansetron 8 MG tablet Commonly known as: ZOFRAN TAKE 1 TABLET BY MOUTH EVERY 12 HOURS ASNEEDED FOR NAUSEA   ORENCIA IV Inject 1 Dose into the vein every 30 (thirty) days.   pravastatin 40 MG tablet Commonly known as: PRAVACHOL What changed: Another medication with the same name was removed. Continue taking this medication, and follow the directions you see here. Changed by: Hollice Espy, MD   Systane 0.4-0.3 % Gel ophthalmic gel Generic drug: Polyethyl Glycol-Propyl Glycol Place 1 application into both eyes 3 (three) times daily as needed (typically twice daily). Equate Brand       Allergies:  Allergies  Allergen Reactions  . Amoxicillin-Pot Clavulanate Nausea Only and Other (See Comments)    Diarrhea Has patient had a PCN reaction causing  immediate rash, facial/tongue/throat swelling, SOB or lightheadedness with hypotension:  Has patient had a PCN reaction causing severe rash involving mucus membranes or skin necrosis: Has patient had a PCN reaction that required hospitalization:  Has patient had a PCN reaction occurring within the last 10 years: If all of the above answers are "NO", then may proceed with Cephalosporin use.   Marland Kitchen Oxycodone-Acetaminophen Nausea Only  . Erythromycin Nausea Only  . Azithromycin Nausea And Vomiting  . Codeine   . Macrolides And Ketolides   . Oxycodone-Acetaminophen   . Penicillins Other (See Comments)    Has patient had a PCN reaction causing immediate rash, facial/tongue/throat swelling, SOB or lightheadedness with hypotension: NO Has patient had a PCN reaction causing severe rash involving mucus membranes or skin necrosis: NO Has patient had a PCN reaction that required hospitalization: NO Has patient had a PCN reaction occurring within the last 10 years: NO If all of the above answers are "NO", then may proceed with Cephalosporin use.   . Tramadol Nausea And Vomiting  . Lamotrigine Rash    LAMICTAL   . Sulfa Antibiotics Rash    Family History: Family History  Problem Relation Age of Onset  . Heart failure Father   . Parkinson's disease Mother   . Bipolar disorder Mother   . Depression Sister   . Schizophrenia Paternal Grandmother   . Diabetes Maternal Grandmother   . Pancreatic cancer Maternal Grandmother   . Cancer Neg Hx   . Heart disease Neg Hx   . Breast cancer Neg Hx     Social History:  reports that she has never smoked. She has quit using smokeless tobacco. She reports that she does not drink alcohol or use drugs.   Physical Exam: BP 139/65   Pulse 76   Constitutional:  Alert and oriented, No acute distress.  Very talkative, pleasant.  In wheel chair.   HEENT: [place] AT, moist mucus membranes.  Trachea midline, no masses. Cardiovascular: No clubbing, cyanosis, or edema. Respiratory: Normal respiratory effort, no increased work of breathing. Skin: No rashes, bruises or suspicious lesions. Neurologic: Grossly intact, no focal deficits, moving all 4 extremities. Psychiatric: Normal mood and affect.  Laboratory Data:  Lab Results  Component Value Date   CREATININE 0.54 (L) 07/05/2019   Urinalysis Urine negative.   Pertinent Imaging: Results for orders placed or performed in visit on 07/19/19  BLADDER SCAN AMB NON-IMAGING  Result Value Ref Range   Scan Result 73 ML     Assessment & Plan:    1.Overactive bladder / urinary frequency UA today unremarkable reassuring for no underlying pathology or infection Adequate emptying of bladder  Discussed conservative management, avoiding bladder irritants and timed/ double voiding's Trial of Myrbetriq25 mg samples for 1 month given to pt  Prefer to avoid anticholinergic given polypharmacy and comorbities including history of Sjoegern symptom F/u in 1 month w/ PA-C for symptom recheck   She would like to have a printed copy of today's visit since she states of not being technological savvy and unable to use mychart.  She prefers to have finished note mailed via US-mail to her house.    [facility] [address] [place], [place] [address] [phone]  I, Lucas Mallow, am acting as a scribe for Dr. Hollice Espy,  I have reviewed the above documentation for accuracy and completeness, and I agree with the above.   Hollice Espy, MD  I spent 45 total minutes on the day of  the encounter including pre-visit review of the medical record, face-to-face time with the patient, and post visit ordering of labs/imaging/tests.

## 2019-07-19 ENCOUNTER — Ambulatory Visit: Payer: Medicare PPO | Admitting: Urology

## 2019-07-19 ENCOUNTER — Other Ambulatory Visit: Payer: Self-pay

## 2019-07-19 VITALS — BP 139/65 | HR 76

## 2019-07-19 DIAGNOSIS — R350 Frequency of micturition: Secondary | ICD-10-CM

## 2019-07-19 LAB — BLADDER SCAN AMB NON-IMAGING: Scan Result: 73

## 2019-07-20 LAB — MICROSCOPIC EXAMINATION
Bacteria, UA: NONE SEEN
RBC, Urine: NONE SEEN /hpf (ref 0–2)
WBC, UA: NONE SEEN /hpf (ref 0–5)

## 2019-07-20 LAB — URINALYSIS, COMPLETE
Bilirubin, UA: NEGATIVE
Glucose, UA: NEGATIVE
Ketones, UA: NEGATIVE
Leukocytes,UA: NEGATIVE
Nitrite, UA: NEGATIVE
Protein,UA: NEGATIVE
RBC, UA: NEGATIVE
Specific Gravity, UA: 1.015 (ref 1.005–1.030)
Urobilinogen, Ur: 0.2 mg/dL (ref 0.2–1.0)
pH, UA: 7.5 (ref 5.0–7.5)

## 2019-07-31 ENCOUNTER — Emergency Department
Admission: EM | Admit: 2019-07-31 | Discharge: 2019-07-31 | Disposition: A | Discharge status: Home / Self Care | Payer: Medicare PPO | Attending: Emergency Medicine | Admitting: Emergency Medicine

## 2019-07-31 ENCOUNTER — Other Ambulatory Visit: Payer: Self-pay

## 2019-07-31 ENCOUNTER — Emergency Department: Payer: Medicare PPO

## 2019-07-31 DIAGNOSIS — E039 Hypothyroidism, unspecified: Secondary | ICD-10-CM | POA: Insufficient documentation

## 2019-07-31 DIAGNOSIS — S199XXA Unspecified injury of neck, initial encounter: Secondary | ICD-10-CM | POA: Diagnosis present

## 2019-07-31 DIAGNOSIS — Y999 Unspecified external cause status: Secondary | ICD-10-CM | POA: Insufficient documentation

## 2019-07-31 DIAGNOSIS — S0990XA Unspecified injury of head, initial encounter: Secondary | ICD-10-CM | POA: Insufficient documentation

## 2019-07-31 DIAGNOSIS — Y92009 Unspecified place in unspecified non-institutional (private) residence as the place of occurrence of the external cause: Secondary | ICD-10-CM | POA: Insufficient documentation

## 2019-07-31 DIAGNOSIS — Z7982 Long term (current) use of aspirin: Secondary | ICD-10-CM | POA: Insufficient documentation

## 2019-07-31 DIAGNOSIS — W01198A Fall on same level from slipping, tripping and stumbling with subsequent striking against other object, initial encounter: Secondary | ICD-10-CM | POA: Diagnosis not present

## 2019-07-31 DIAGNOSIS — S40011A Contusion of right shoulder, initial encounter: Secondary | ICD-10-CM | POA: Insufficient documentation

## 2019-07-31 DIAGNOSIS — Z87891 Personal history of nicotine dependence: Secondary | ICD-10-CM | POA: Insufficient documentation

## 2019-07-31 DIAGNOSIS — S1201XA Stable burst fracture of first cervical vertebra, initial encounter for closed fracture: Secondary | ICD-10-CM | POA: Diagnosis not present

## 2019-07-31 DIAGNOSIS — S12201A Unspecified nondisplaced fracture of third cervical vertebra, initial encounter for closed fracture: Secondary | ICD-10-CM

## 2019-07-31 DIAGNOSIS — I10 Essential (primary) hypertension: Secondary | ICD-10-CM | POA: Insufficient documentation

## 2019-07-31 DIAGNOSIS — Z79899 Other long term (current) drug therapy: Secondary | ICD-10-CM | POA: Diagnosis not present

## 2019-07-31 DIAGNOSIS — M069 Rheumatoid arthritis, unspecified: Secondary | ICD-10-CM | POA: Diagnosis not present

## 2019-07-31 DIAGNOSIS — Y9389 Activity, other specified: Secondary | ICD-10-CM | POA: Insufficient documentation

## 2019-07-31 MED ORDER — ACETAMINOPHEN 325 MG PO TABS
650.0000 mg | ORAL_TABLET | Freq: Once | ORAL | Status: AC
  Administered 2019-07-31: 650 mg via ORAL
  Filled 2019-07-31: qty 2

## 2019-07-31 NOTE — Discharge Instructions (Signed)
Please wear cervical collar at all times except you may remove it for sleeping/resting or showering/bathing.

## 2019-07-31 NOTE — ED Triage Notes (Addendum)
Pt comes into the ED via EMS from home with c/o loss of balance and a fall last night, pt c/o head, neck and shoulder pain. Pt is a/ox4 walks with a cane.. pt denies LOC, states she felt ok last night but is having pain this morning.

## 2019-07-31 NOTE — ED Provider Notes (Signed)
[facility] Emergency Department Provider Note   ____________________________________________   First MD Initiated Contact with Patient 07/31/19 [phone]     (approximate)  I have reviewed the triage vital signs and the nursing notes.   HISTORY  Chief Complaint Fall    HPI Stephanie [name] is a 75 y.o. female here for evaluation of neck and head pain after fall last night  Patient reports she was moving plants in her house, she was lifting a plant and it caused her to lose her balance. She fell striking the back of her head against the wall and then falling onto her back. She did not lose consciousness. She able to get herself up but when she got up this morning noticed that she was feeling very sore across the back of the scalp and also along the middle of her neck prompting her to call EMS to be evaluated  She reports some mild discomfort in the right shoulder, very minimal. Took Aleve for the pain and reports that it is helped quite a bit. Currently mild soreness across the back of the scalp and also some tenderness in the left side of the neck mild. No numbness tingling or weakness. No chest pain no recent illness  She does report she has been seen urologist for some urinary symptoms, no worsening or concerns has now started Myrbetriq     Past Medical History:  Diagnosis Date  . Anginal pain ([place])   . Anxiety   . Bipolar 1 disorder ([place])   . Bipolar disorder ([place])   . Coronary artery disease   . Depression   . Fatigue   . Fibromyalgia   . GERD (gastroesophageal reflux disease)   . Heart disease   . Heart failure ([place])   . HLD (hyperlipidemia)   . HOH (hard of hearing)   . Hypothyroidism   . IBS (irritable bowel syndrome)   . Pelvic pain in female   . Perianal lesion    high grade sil lesion- keratinizing type  . RA (rheumatoid arthritis) ([place])   . Raynaud phenomenon   . Sjogren's disease ([place])   . Sleep apnea   . Vaginal Pap smear,  abnormal     Patient Active Problem List   Diagnosis Date Noted  . Bipolar I disorder, most recent episode (or current) manic ([place]) 11/02/2018  . Insomnia due to mental disorder 11/02/2018  . 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/20/2017  . Chest pain 05/17/2015  . OP (osteoporosis) 02/25/2015  . Sjogren's syndrome ([place]) 02/20/2015  . Combined fat and carbohydrate induced hyperlipemia 10/03/2014  . Breathlessness on exertion 09/24/2014  . Bipolar 1 disorder with moderate mania ([place]) 08/29/2014  . Bipolar disorder, in full remission, most recent episode manic ([place]) 08/29/2014  . H/O gastrointestinal disease 08/29/2014  . GAD (generalized anxiety disorder) 08/29/2014  . Fibromyalgia 08/29/2014  . H/O elevated lipids 08/29/2014  . H/O: hypothyroidism 08/29/2014  . Insomnia, persistent 08/29/2014  . 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 Chronic pain associated with significant psychosocial dysfunction 08/29/2014  . 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 Rheumatoid arthritis ([place]) 08/29/2014  . Benign essential HTN 01/10/2014  . 3-vessel CAD 01/10/2014  . Polypharmacy 06/29/2013  . High risk medication use 06/29/2013  . Arthritis or polyarthritis, rheumatoid ([place]) 06/22/2013  . Acid reflux 05/05/2012  . Adult hypothyroidism 01/07/2012  . Flu vaccine need 01/07/2012  . Cough 12/14/2011  . CD (contact dermatitis) 10/15/2011  . Finger wound, simple,  open 10/15/2011    Past Surgical History:  Procedure Laterality Date  . CATARACT EXTRACTION W/PHACO Left 02/23/2017   Procedure: CATARACT EXTRACTION PHACO AND INTRAOCULAR LENS PLACEMENT (IOC);  Surgeon: Birder Robson, MD;  Location: [facility] ORS;  Service: Ophthalmology;  Laterality: Left;  [place] 00:35 AP% 9.6 CDE 3.47 Fluid pack lot # [id number]  . CATARACT EXTRACTION W/PHACO Right 03/30/2017   Procedure: CATARACT EXTRACTION PHACO AND INTRAOCULAR LENS PLACEMENT ([place]);  Surgeon: Birder Robson, MD;  Location: [facility] ORS;  Service: Ophthalmology;  Laterality: Right;  [place] 00:32.6 AP% 13.8 CDE 4.51 Fluid Pack Lot # [id number]  . EYE SURGERY    . TONSILLECTOMY      Prior to Admission medications   Medication Sig Start Date End Date Taking? Authorizing Provider  Abatacept (ORENCIA IV) Inject 1 Dose into the vein every 30 (thirty) days.    [provider]  acyclovir (ZOVIRAX) 200 MG capsule  06/27/18   [provider]  aspirin 81 MG chewable tablet Chew 81 mg by mouth daily with breakfast.    [provider]  benzonatate (TESSALON) 200 MG capsule TAKE 1 CAPSULE BY MOUTH 3 TIMES DAILY ASNEEDED FOR COUGH FOR UP TO SEVEN DAYS 02/13/19   [provider]  Calcium Citrate-Vitamin D ([employer] CALCIUM CITRATE +D3 MINI) 200-250 MG-UNIT TABS Take 1 tablet by mouth daily after breakfast.     [provider]  carbamazepine (TEGRETOL XR) 200 MG 12 hr tablet Take 2 tablets (400 mg total) by mouth 2 (two) times daily. 05/31/19   Ursula Alert, MD  divalproex (DEPAKOTE ER) 500 MG 24 hr tablet Take 1 tablet twice a day 05/31/19   Ursula Alert, MD  fluticasone Asencion Islam) 50 MCG/ACT nasal spray  03/16/18   [provider]  folic acid (FOLVITE) 1 MG tablet Take 2 mg by mouth daily with breakfast.  08/29/14   [provider]  gabapentin (NEURONTIN) 300 MG capsule Take 1 tablet twice a day 05/31/19   Ursula Alert, MD  hydrOXYzine (ATARAX/VISTARIL) 10 MG tablet Take 1-2 tablets (10-20 mg total) by mouth at bedtime as needed. for sleep 05/31/19   Ursula Alert, MD  ibandronate (BONIVA) 150 MG tablet  03/17/18   [provider]  levothyroxine (SYNTHROID, LEVOTHROID) 75 MCG tablet TAKE 1 TABLET BY MOUTH EACH DAY ON EMPTYSTOMACH WITH A GLASS OF WATERAT LEAST 30 TO 60 MINUTES BEFORE BREAKFAST IN THE MORNING 11/26/15   [provider]  meloxicam (MOBIC) 15 MG tablet  04/26/19   [provider]  methotrexate 2.5 MG tablet Take 15 mg  by mouth every Wednesday. [facility] AT LUNCH    [provider]  mirtazapine (REMERON) 15 MG tablet Take 1 tablet (15 mg total) by mouth at bedtime. 05/31/19   Ursula Alert, MD  mometasone (ELOCON) 0.1 % ointment Apply to fissure of right thumb BID until healed. 05/03/19   [provider]  Multiple Vitamins-Minerals (MULTIVITAMIN WITH MINERALS) tablet Take 1 tablet by mouth daily with breakfast.     [provider]  nitroGLYCERIN (NITROSTAT) 0.4 MG SL tablet Place 0.4 mg under the tongue every 5 (five) minutes as needed for chest pain.  10/23/11   [provider]  ondansetron (ZOFRAN) 8 MG tablet TAKE 1 TABLET BY MOUTH EVERY 12 HOURS ASNEEDED FOR NAUSEA 12/23/15   [provider]  Polyethyl Glycol-Propyl Glycol (SYSTANE) 0.4-0.3 % GEL ophthalmic gel Place 1 application into both eyes 3 (three) times daily as needed (typically twice daily). Equate Brand  [provider]  pravastatin (PRAVACHOL) 40 MG tablet  06/19/19   [provider]    Allergies Amoxicillin-pot clavulanate, Oxycodone-acetaminophen, Erythromycin, Azithromycin, Codeine, Macrolides and ketolides, Oxycodone-acetaminophen, Penicillins, Tramadol, Lamotrigine, and Sulfa antibiotics  Family History  Problem Relation Age of Onset  . Heart failure Father   . Parkinson's disease Mother   . Bipolar disorder Mother   . Depression Sister   . Schizophrenia Paternal Grandmother   . Diabetes Maternal Grandmother   . Pancreatic cancer Maternal Grandmother   . Cancer Neg Hx   . Heart disease Neg Hx   . Breast cancer Neg Hx     Social History Social History   Tobacco Use  . Smoking status: Never Smoker  . Smokeless tobacco: Former Network engineer Use Topics  . Alcohol use: No    Alcohol/week: 0.0 standard drinks  . Drug use: No    Review of Systems Constitutional: No fever/chills Eyes: No visual changes. ENT: No sore throat. Some neck discomfort over the left  side neck mild. Cardiovascular: Denies chest pain. Respiratory: Denies shortness of breath. Gastrointestinal: No abdominal pain.   Genitourinary: Negative for dysuria. Frequent urination, taking Myrbetriq Musculoskeletal: Negative for back pain. Some slight soreness across the back the right shoulder but able to use it well. Skin: Negative for rash. Neurological: Negative for headaches, areas of focal weakness or numbness.    ____________________________________________   PHYSICAL EXAM:  VITAL SIGNS: ED Triage Vitals  Enc Vitals Group     BP 07/31/19 0844 (!) 158/82     Pulse Rate 07/31/19 0844 74     Resp 07/31/19 0844 17     Temp 07/31/19 0844 98.6 F (37 C)     Temp Source 07/31/19 0844 Oral     SpO2 07/31/19 0844 96 %     Weight 07/31/19 0845 110 lb (49.9 kg)     Height 07/31/19 0845 5' (1.524 m)     Head Circumference --      Peak Flow --      Pain Score 07/31/19 0845 6     Pain Loc --      Pain Edu? --      Excl. in [place]? --     Constitutional: Alert and oriented. Well appearing and in no acute distress. Eyes: Conjunctivae are normal. Head: Atraumatic. Nose: No congestion/rhinnorhea. Mouth/Throat: Mucous membranes are moist. Neck: No stridor. Reports some mild tenderness to palpation along the mid cervical spine. No thoracic or lumbar tenderness. No cervical deformities. Cardiovascular: Normal rate, regular rhythm. Grossly normal heart sounds.  Good peripheral circulation. Respiratory: Normal respiratory effort.  No retractions. Lungs CTAB. Gastrointestinal: Soft and nontender. No distention. Musculoskeletal: No lower extremity tenderness nor edema. Good range of motion of all extremities. No numbness or weakness. Reports some slight soreness to palpation over the posterior right shoulder without bruising or deformity. Good range of motion of both shoulder joints as well as ability to stand, walk with a cane without pain or distress. Neurologic:  Normal speech and  language. No gross focal neurologic deficits are appreciated.  Skin:  Skin is warm, dry and intact. No rash noted. Psychiatric: Mood and affect are normal. Speech and behavior are normal.  ____________________________________________   LABS (all labs ordered are listed, but only abnormal results are displayed)  Labs Reviewed - No data to display ____________________________________________  EKG   ____________________________________________  RADIOLOGY  DG Shoulder Right  Result Date: 07/31/2019 CLINICAL DATA:  Pain, injury EXAM: RIGHT SHOULDER - 2+ VIEW  COMPARISON:  None. FINDINGS: There is no evidence of fracture or dislocation. There is no evidence of arthropathy or other focal bone abnormality. Soft tissues are unremarkable. IMPRESSION: No fracture or dislocation of the right shoulder. Joint spaces are preserved. Electronically Signed   By: Eddie Candle M.D.   On: 07/31/2019 09:29   CT Head Wo Contrast  Result Date: 07/31/2019 CLINICAL DATA:  Pain following fall EXAM: CT HEAD WITHOUT CONTRAST CT CERVICAL SPINE WITHOUT CONTRAST TECHNIQUE: Multidetector CT imaging of the head and cervical spine was performed following the standard protocol without intravenous contrast. Multiplanar CT image reconstructions of the cervical spine were also generated. COMPARISON:  None. FINDINGS: CT HEAD FINDINGS Brain: There is mild diffuse atrophy. There is no intracranial mass, hemorrhage, extra-axial fluid collection, or midline shift. There is slight small vessel disease in the centra semiovale bilaterally. Elsewhere brain parenchyma appears unremarkable. No evident acute infarct. Vascular: No hyperdense vessel. No appreciable vascular calcification. Skull: Bony calvarium appears intact. Sinuses/Orbits: Visualized paranasal sinuses are clear. Orbits appear symmetric bilaterally. Other: Mastoid air cells are clear. CT CERVICAL SPINE FINDINGS Alignment: There is 3 mm of anterolisthesis of C3 on C4. There is  2 mm of anterolisthesis of C4 on C5. No other spondylolisthesis. Skull base and vertebrae: Skull base and craniocervical junction regions appear normal. There is a nondisplaced fracture of the lamina at C3 on the left with alignment anatomic. There is no impression on the cord in this area. No other fracture is evident. No blastic or lytic bone lesions. Soft tissues and spinal canal: Prevertebral soft tissues and predental space regions are normal. Disc levels: There is moderately severe disc space narrowing at C5-6. There is slight disc space narrowing at C6-7 and C7-T1. There is facet hypertrophy at multiple levels with exit foraminal narrowing on the left at C3-4 due to bony hypertrophy causing impression on the exiting nerve root. Similar changes are noted on the right at C4-5 and on the right at C5-6. No frank disc extrusion or high-grade stenosis evident. Upper chest: Visualized upper lung regions are clear. Other: There is calcification in each carotid artery. IMPRESSION: CT head: Atrophy with mild periventricular small vessel disease. No mass, hemorrhage, or extra-axial fluid collection. No acute infarct. CT cervical spine: 1. Nondisplaced fracture of the left C3 lamina. No involvement of the cord or canal from this nondisplaced fracture. No other fracture evident. 2. Foci of spondylolisthesis at C3-4 and C4, likely due to underlying spondylosis. 3. Multilevel arthropathy. No frank disc extrusion or high-grade stenosis. 4.  Calcification in each carotid artery. Critical Value/emergent results were called by telephone at the time of interpretation on 07/31/2019 at 9:27 am to provider Mickey Hebel , who verbally acknowledged these results. Electronically Signed   By: Lowella Grip III M.D.   On: 07/31/2019 09:27   CT Cervical Spine Wo Contrast  Result Date: 07/31/2019 CLINICAL DATA:  Pain following fall EXAM: CT HEAD WITHOUT CONTRAST CT CERVICAL SPINE WITHOUT CONTRAST TECHNIQUE: Multidetector CT imaging  of the head and cervical spine was performed following the standard protocol without intravenous contrast. Multiplanar CT image reconstructions of the cervical spine were also generated. COMPARISON:  None. FINDINGS: CT HEAD FINDINGS Brain: There is mild diffuse atrophy. There is no intracranial mass, hemorrhage, extra-axial fluid collection, or midline shift. There is slight small vessel disease in the centra semiovale bilaterally. Elsewhere brain parenchyma appears unremarkable. No evident acute infarct. Vascular: No hyperdense vessel. No appreciable vascular calcification. Skull: Bony calvarium appears intact. Sinuses/Orbits: Visualized paranasal  sinuses are clear. Orbits appear symmetric bilaterally. Other: Mastoid air cells are clear. CT CERVICAL SPINE FINDINGS Alignment: There is 3 mm of anterolisthesis of C3 on C4. There is 2 mm of anterolisthesis of C4 on C5. No other spondylolisthesis. Skull base and vertebrae: Skull base and craniocervical junction regions appear normal. There is a nondisplaced fracture of the lamina at C3 on the left with alignment anatomic. There is no impression on the cord in this area. No other fracture is evident. No blastic or lytic bone lesions. Soft tissues and spinal canal: Prevertebral soft tissues and predental space regions are normal. Disc levels: There is moderately severe disc space narrowing at C5-6. There is slight disc space narrowing at C6-7 and C7-T1. There is facet hypertrophy at multiple levels with exit foraminal narrowing on the left at C3-4 due to bony hypertrophy causing impression on the exiting nerve root. Similar changes are noted on the right at C4-5 and on the right at C5-6. No frank disc extrusion or high-grade stenosis evident. Upper chest: Visualized upper lung regions are clear. Other: There is calcification in each carotid artery. IMPRESSION: CT head: Atrophy with mild periventricular small vessel disease. No mass, hemorrhage, or extra-axial fluid  collection. No acute infarct. CT cervical spine: 1. Nondisplaced fracture of the left C3 lamina. No involvement of the cord or canal from this nondisplaced fracture. No other fracture evident. 2. Foci of spondylolisthesis at C3-4 and C4, likely due to underlying spondylosis. 3. Multilevel arthropathy. No frank disc extrusion or high-grade stenosis. 4.  Calcification in each carotid artery. Critical Value/emergent results were called by telephone at the time of interpretation on 07/31/2019 at 9:27 am to provider Silvestre Mines , who verbally acknowledged these results. Electronically Signed   By: Lowella Grip III M.D.   On: 07/31/2019 09:27     CT imaging of the cervical spine discussed with Dr. Jasmine December of radiology. Personally viewed films as well and discussed with Dr. Cathleen Fears of neurosurgery ____________________________________________   PROCEDURES  Procedure(s) performed: None  Procedures  Critical Care performed: No  ____________________________________________   INITIAL IMPRESSION / ASSESSMENT AND PLAN / ED COURSE  Pertinent labs & imaging results that were available during my care of the patient were reviewed by me and considered in my medical decision making (see chart for details).   Patient presents after fall, appears mechanical in nature. Noted injuries include slight soreness over the back of the right shoulder, some mid neck discomfort is noted to have a C3 lamina fracture that is nondisplaced.  Clinical Course as of Jul 31 1126  Mon Jul 31, 2019  0917 Awaiting patient arrival to room 4   [MQ]  1058 Dr. Lacinda Axon recommends Aspen collar and follow-up in the clinic in 2 weeks.  He is reviewed CT imaging at this time.   [MQ]  1109 Updated patient's daughter on plan of care, Heather.  Patient requested   [MQ]    Clinical Course User Index [MQ] Delman Kitten, MD   ----------------------------------------- 9:49 AM on  07/31/2019 -----------------------------------------  Case discussed with Cathleen Fears of neurosurgery, recommends use of a Aspen collar which we have ordered. Additionally, Dr. Lacinda Axon personally reviewed imaging and recommends follow-up in 2 weeks, use of cervical collar except when resting or bathing.  Discussed with the patient's daughter as well, understands plan of care.  Patient understanding agreeable with plan and follow-up.  ____________________________________________   FINAL CLINICAL IMPRESSION(S) / ED DIAGNOSES  Final diagnoses:  Closed nondisplaced fracture of third cervical  vertebra, unspecified fracture morphology, initial encounter ([place])  Closed head injury, initial encounter  Contusion of right shoulder, initial encounter        Note:  This document was prepared using Dragon voice recognition software and may include unintentional dictation errors       Delman Kitten, MD 07/31/19 1128

## 2019-07-31 NOTE — ED Notes (Signed)
Pt verbalized discharge instructions and has no questions at this time 

## 2019-08-15 ENCOUNTER — Telehealth: Payer: Self-pay

## 2019-08-15 NOTE — Telephone Encounter (Signed)
ann called (friend that usually bring Jessia to her appt) she states that she has concerns about [place] that she would like to discuss with you .

## 2019-08-15 NOTE — Telephone Encounter (Signed)
Returned call to Stephanie [name], friend -discussed that she can share information however we will not be able to release information.  Per [name]-patient this morning texted her after returning from her doctor's office that she was asked by her doctor not to drive for the next 3 months.  She also told her to let her family know and that she was not in the mood to talk to anyone at this time.  She got worried and hence decided to let writer know.  Writer contacted patient-patient appeared to be pleasant, does report she is concerned about the fact that she cannot drive however she has accepted it and is currently trying to get some help with home health.  She does report she is not having any significant depressive symptoms however probably in a manic episode where she feels more energetic.  She however reports she is able to manage it and does not need any changes with her mood stabilizer today.  She reports she was taken off of the Remeron by her primary care provider.  She is currently on melatonin 5 mg and Atarax which helps with her sleep.  She wonders whether she may need something else for her sleep.  Discussed to increase melatonin to 10 mg and also to use Atarax only as needed if her sleep is interrupted.  Advised patient to limit the use of Atarax due to her age as well as fall risk.  Patient denies any suicidal ideation.  Patient advised to reach out to her therapist to continue CBT  Patient also reports she wants to get some billing information from [place].  Discussed with patient to call the front desk for the billing department number.

## 2019-08-22 ENCOUNTER — Telehealth: Payer: Self-pay

## 2019-08-22 NOTE — Telephone Encounter (Signed)
spoke with patient she ok with waiting until dr. Shea Evans gets back.

## 2019-08-22 NOTE — Telephone Encounter (Signed)
pt called states that she is not sleeping well she states she increased her melatonin to 10mg  but it not working she needs something to help her sleep. Oh she said that she can not take remeron

## 2019-08-22 NOTE — Telephone Encounter (Signed)
I would recommend her to wait until Dr. Shea Evans returns back to discuss the medication options for sleeping difficulties. Can you please convey this message to her. Thanks

## 2019-08-23 ENCOUNTER — Ambulatory Visit: Payer: Medicare PPO | Admitting: Urology

## 2019-08-23 ENCOUNTER — Other Ambulatory Visit: Payer: Self-pay

## 2019-08-23 ENCOUNTER — Encounter: Payer: Self-pay | Admitting: Urology

## 2019-08-23 VITALS — BP 130/74 | HR 72 | Ht 60.0 in | Wt 110.0 lb

## 2019-08-23 DIAGNOSIS — N3941 Urge incontinence: Secondary | ICD-10-CM

## 2019-08-23 DIAGNOSIS — R350 Frequency of micturition: Secondary | ICD-10-CM | POA: Diagnosis not present

## 2019-08-23 LAB — BLADDER SCAN AMB NON-IMAGING: Scan Result: 0

## 2019-08-23 MED ORDER — MIRABEGRON ER 25 MG PO TB24: 25.0000 mg | ORAL_TABLET | Freq: Every day | ORAL | 0 refills | Status: DC

## 2019-08-23 NOTE — Telephone Encounter (Signed)
pt called wanted to know if her 6-30th appt can be over the phone.  she is having issues with her internet and she not sure how to do the virtual. and she can not drive for 3 months because she has a broken neck

## 2019-08-23 NOTE — Progress Notes (Signed)
07/19/19 9:33 AM   Stephanie [name] [birth year] [id number]  Referring provider: Derinda Late, MD [phone] [facility] [place],  [place] [address] Chief Complaint  Patient presents with   Urinary Frequency    HPI: Stephanie [name] is a 74 y.o. F who presents today for follow-up after being placed on Myrbetriq 25 mg for overactive bladder.    She was initially seen by Dr. Erlene Quan on Jul 19, 2019.  At that visit she reported frequency every 15 to 20 minutes associated with urge incontinence for the last several months.  Please see her note for detailed history.   They discussed behavioral modifications and she was given Myrbetriq 25 mg samples for 1 month.  In the interim, she has suffered a fracture of her third cervical vertebrae due to a mechanical fall.  The patient is  experiencing urgency x 4-7 (improvement), frequency x 4-7 (improvement), is restricting fluids to avoid visits to the restroom, is engaging in toilet mapping, incontinence x 0-3 (improvement) and nocturia x 0-3 (worse).   Her BP is 130/74.   Her PVR is 73 mL.   Patient denies any modifying or aggravating factors.  Patient denies any gross hematuria, dysuria or suprapubic/flank pain.  Patient denies any fevers, chills, nausea or vomiting.  She has been taking the Myrbetriq 25 mg daily.  She feels the Myrbetriq has been helpful.  There has been a significant decrease in her daytime frequency and decrease in her urge incontinence.   PMH: Past Medical History:  Diagnosis Date   Anginal pain ([place])    Anxiety    Bipolar 1 disorder ([place])    Bipolar disorder ([place])    Coronary artery disease    Depression    Fatigue    Fibromyalgia    GERD (gastroesophageal reflux disease)    Heart disease    Heart failure ([facility])    HLD (hyperlipidemia)    HOH (hard of hearing)    Hypothyroidism    IBS (irritable bowel syndrome)    Pelvic pain in female    Perianal lesion     high grade sil lesion- keratinizing type   RA (rheumatoid arthritis) ([place])    Raynaud phenomenon    Sjogren's disease ([place])    Sleep apnea    Vaginal Pap smear, abnormal     Surgical History: Past Surgical History:  Procedure Laterality Date   CATARACT EXTRACTION W/PHACO Left 02/23/2017   Procedure: CATARACT EXTRACTION PHACO AND INTRAOCULAR LENS PLACEMENT ([place]);  Surgeon: Birder Robson, MD;  Location: [facility] ORS;  Service: Ophthalmology;  Laterality: Left;  [place] 00:35 AP% 9.6 CDE 3.47 Fluid pack lot # [id number]   CATARACT EXTRACTION W/PHACO Right 03/30/2017   Procedure: CATARACT EXTRACTION PHACO AND INTRAOCULAR LENS PLACEMENT ([facility]);  Surgeon: Birder Robson, MD;  Location: [facility] OR[facility];  Service: Ophthalmology;  Laterality: Right;  [place] 00:32.6 AP% 13.8 CDE 4.51 Fluid Pack Lot # [id number]   EYE SURGERY     TONSILLECTOMY      Home Medications:  Allergies as of 08/23/2019      Reactions   Amoxicillin-pot Clavulanate Nausea Only, Other (See Comments)   Diarrhea Has patient had a PCN reaction causing immediate rash, facial/tongue/throat swelling, SOB or lightheadedness with hypotension:  Has patient had a PCN reaction causing severe rash involving mucus membranes or skin necrosis: Has patient had a PCN reaction that required hospitalization:  Has patient had a PCN reaction occurring within the last 10 years: If  all of the above answers are "NO", then may proceed with Cephalosporin use.   Oxycodone-acetaminophen Nausea Only   Erythromycin Nausea Only   Azithromycin Nausea And Vomiting   Codeine    Macrolides And Ketolides    Oxycodone-acetaminophen    Penicillins Other (See Comments)   Has patient had a PCN reaction causing immediate rash, facial/tongue/throat swelling, SOB or lightheadedness with hypotension: NO Has patient had a PCN reaction causing severe rash involving mucus membranes or skin necrosis: NO Has patient had a PCN reaction that required hospitalization:  NO Has patient had a PCN reaction occurring within the last 10 years: NO If all of the above answers are "NO", then may proceed with Cephalosporin use.   Tramadol Nausea And Vomiting   Lamotrigine Rash   LAMICTAL   Sulfa Antibiotics Rash      Medication List       Accurate as of August 23, 2019  9:33 AM. If you have any questions, ask your nurse or doctor.        acyclovir 200 MG capsule Commonly known as: ZOVIRAX   aspirin 81 MG chewable tablet Chew 81 mg by mouth daily with breakfast.   benzonatate 200 MG capsule Commonly known as: TESSALON TAKE 1 CAPSULE BY MOUTH 3 TIMES DAILY ASNEEDED FOR COUGH FOR UP TO SEVEN DAYS   carbamazepine 200 MG 12 hr tablet Commonly known as: TEGRETOL XR Take 2 tablets (400 mg total) by mouth 2 (two) times daily.   ciprofloxacin 250 MG tablet Commonly known as: CIPRO Take 250 mg by mouth 2 (two) times daily.   [employer] Calcium Citrate +D3 Mini 200-250 MG-UNIT Tabs Generic drug: Calcium Citrate-Vitamin D Take 1 tablet by mouth daily after breakfast.   divalproex 500 MG 24 hr tablet Commonly known as: DEPAKOTE ER Take 1 tablet twice a day   fluticasone 50 MCG/ACT nasal spray Commonly known as: FLONASE   folic acid 1 MG tablet Commonly known as: FOLVITE Take 2 mg by mouth daily with breakfast.   gabapentin 300 MG capsule Commonly known as: NEURONTIN Take 1 tablet twice a day   hydrOXYzine 10 MG tablet Commonly known as: ATARAX/VISTARIL Take 1-2 tablets (10-20 mg total) by mouth at bedtime as needed. for sleep   ibandronate 150 MG tablet Commonly known as: BONIVA   levothyroxine 75 MCG tablet Commonly known as: SYNTHROID TAKE 1 TABLET BY MOUTH EACH DAY ON EMPTYSTOMACH WITH A GLASS OF WATERAT LEAST 30 TO 60 MINUTES BEFORE BREAKFAST IN THE MORNING   meloxicam 15 MG tablet Commonly known as: MOBIC   methotrexate 2.5 MG tablet Take 15 mg by mouth every Wednesday. WEDNESDAYS AT LUNCH   mometasone 0.1 % ointment Commonly known as:  ELOCON Apply to fissure of right thumb BID until healed.   multivitamin with minerals tablet Take 1 tablet by mouth daily with breakfast.   Nitrostat 0.4 MG SL tablet Generic drug: nitroGLYCERIN Place 0.4 mg under the tongue every 5 (five) minutes as needed for chest pain.   ondansetron 8 MG tablet Commonly known as: ZOFRAN TAKE 1 TABLET BY MOUTH EVERY 12 HOURS ASNEEDED FOR NAUSEA   ORENCIA IV Inject 1 Dose into the vein every 30 (thirty) days.   pravastatin 40 MG tablet Commonly known as: PRAVACHOL   Systane 0.4-0.3 % Gel ophthalmic gel Generic drug: Polyethyl Glycol-Propyl Glycol Place 1 application into both eyes 3 (three) times daily as needed (typically twice daily). Equate Brand       Allergies:  Allergies  Allergen Reactions  Amoxicillin-Pot Clavulanate Nausea Only and Other (See Comments)    Diarrhea Has patient had a PCN reaction causing immediate rash, facial/tongue/throat swelling, SOB or lightheadedness with hypotension:  Has patient had a PCN reaction causing severe rash involving mucus membranes or skin necrosis: Has patient had a PCN reaction that required hospitalization:  Has patient had a PCN reaction occurring within the last 10 years: If all of the above answers are "NO", then may proceed with Cephalosporin use.    Oxycodone-Acetaminophen Nausea Only   Erythromycin Nausea Only   Azithromycin Nausea And Vomiting   Codeine    Macrolides And Ketolides    Oxycodone-Acetaminophen    Penicillins Other (See Comments)    Has patient had a PCN reaction causing immediate rash, facial/tongue/throat swelling, SOB or lightheadedness with hypotension: NO Has patient had a PCN reaction causing severe rash involving mucus membranes or skin necrosis: NO Has patient had a PCN reaction that required hospitalization: NO Has patient had a PCN reaction occurring within the last 10 years: NO If all of the above answers are "NO", then may proceed with  Cephalosporin use.    Tramadol Nausea And Vomiting   Lamotrigine Rash    LAMICTAL   Sulfa Antibiotics Rash    Family History: Family History  Problem Relation Age of Onset   Heart failure Father    Parkinson's disease Mother    Bipolar disorder Mother    Depression Sister    Schizophrenia Paternal Grandmother    Diabetes Maternal Grandmother    Pancreatic cancer Maternal Grandmother    Cancer Neg Hx    Heart disease Neg Hx    Breast cancer Neg Hx     Social History:  reports that she has never smoked. She has quit using smokeless tobacco. She reports that she does not drink alcohol and does not use drugs.   Physical Exam: BP 130/74    Pulse 72    Ht '5\' 5"'  (1.651 m)    Wt 110 lb (49.9 kg)    BMI 18.30 kg/m   Constitutional:  Well nourished. Alert and oriented, No acute distress. HEENT: [place] AT, mask in place.  Aspen collar in place Cardiovascular: No clubbing, cyanosis, or edema. Respiratory: Normal respiratory effort, no increased work of breathing. Neurologic: Grossly intact, no focal deficits, moving all 4 extremities. Psychiatric: Normal mood and affect.   Laboratory Data: Lab Results  Component Value Date   CREATININE 0.54 (L) 07/05/2019   Urinalysis Results for orders placed or performed in visit on 07/19/19  Microscopic Examination   URINE  Result Value Ref Range   WBC, UA None seen 0 - 5 /hpf   RBC None seen 0 - 2 /hpf   Epithelial Cells (non renal) 0-10 0 - 10 /hpf   Bacteria, UA None seen None seen/Few  Urinalysis, Complete  Result Value Ref Range   Specific Gravity, UA 1.015 1.005 - 1.030   pH, UA 7.5 5.0 - 7.5   Color, UA Yellow Yellow   Appearance Ur Clear Clear   Leukocytes,UA Negative Negative   Protein,UA Negative Negative/Trace   Glucose, UA Negative Negative   Ketones, UA Negative Negative   RBC, UA Negative Negative   Bilirubin, UA Negative Negative   Urobilinogen, Ur 0.2 0.2 - 1.0 mg/dL   Nitrite, UA Negative Negative    Microscopic Examination See below:   BLADDER SCAN AMB NON-IMAGING  Result Value Ref Range   Scan Result 73 ML   I have reviewed the labs.  Pertinent  imaging Results for NEVIAH, BRAUD (MRN [id number]) as of 08/23/2019 09:33  Ref. Range 07/19/2019 09:26  Scan Result Unknown 73 ML   Assessment & Plan:    1. Overactive bladder / urinary frequency Has met her goal with Myrbetriq 25 mg daily Will continue the medication, script sent to pharmacy [facility] in 3 months for OAB questionnaire and PVR  2.  Urge incontinence See above   [facility] [address] [place], [place] [address] [phone]  Maat Kafer, PA-C  I spent 30 minutes on the day of the encounter to include pre-visit record review, face-to-face time with the patient, and post-visit ordering of tests.

## 2019-08-24 NOTE — Telephone Encounter (Signed)
Returned call to patient.  Discussed her sleep problems.  She reports her sleep has gotten better.  She is currently using sleep hygiene techniques, relaxation techniques, coloring prior to bedtime which relaxes her.    She has good support system from her housekeeper who comes in to help her.

## 2019-08-28 ENCOUNTER — Telehealth: Payer: Self-pay

## 2019-08-28 NOTE — Telephone Encounter (Signed)
can pt do a phone visit instead of a virtual visit.

## 2019-08-28 NOTE — Telephone Encounter (Signed)
Yes she can as long as her health insurance plan is ok with it.

## 2019-08-29 NOTE — Telephone Encounter (Signed)
Pt was notified.  

## 2019-09-06 ENCOUNTER — Other Ambulatory Visit: Payer: Self-pay

## 2019-09-06 ENCOUNTER — Encounter: Payer: Self-pay | Admitting: Psychiatry

## 2019-09-06 ENCOUNTER — Telehealth: Payer: Self-pay

## 2019-09-06 ENCOUNTER — Ambulatory Visit (INDEPENDENT_AMBULATORY_CARE_PROVIDER_SITE_OTHER): Payer: Medicare PPO | Admitting: Psychiatry

## 2019-09-06 DIAGNOSIS — Z79899 Other long term (current) drug therapy: Secondary | ICD-10-CM | POA: Diagnosis not present

## 2019-09-06 DIAGNOSIS — F411 Generalized anxiety disorder: Secondary | ICD-10-CM | POA: Diagnosis not present

## 2019-09-06 DIAGNOSIS — F5105 Insomnia due to other mental disorder: Secondary | ICD-10-CM | POA: Diagnosis not present

## 2019-09-06 DIAGNOSIS — Z8659 Personal history of other mental and behavioral disorders: Secondary | ICD-10-CM

## 2019-09-06 DIAGNOSIS — F3174 Bipolar disorder, in full remission, most recent episode manic: Secondary | ICD-10-CM | POA: Diagnosis not present

## 2019-09-06 NOTE — Telephone Encounter (Signed)
avs printed and mailed

## 2019-09-06 NOTE — Progress Notes (Signed)
Provider Location : [facility] Patient Location : Home  Virtual Visit via Telephone Note  I connected with Stephanie [name] on 09/06/19 at 10:00 AM EDT by telephone and verified that I am speaking with the correct person using two identifiers.   I discussed the limitations, risks, security and privacy concerns of performing an evaluation and management service by telephone and the availability of in person appointments. I also discussed with the patient that there may be a patient responsible charge related to this service. The patient expressed understanding and agreed to proceed.    I discussed the assessment and treatment plan with the patient. The patient was provided an opportunity to ask questions and all were answered. The patient agreed with the plan and demonstrated an understanding of the instructions.   The patient was advised to call back or seek an in-person evaluation if the symptoms worsen or if the condition fails to improve as anticipated.   [place] MD OP Progress Note  09/06/2019 12:12 PM Stephanie [name]  MRN:  [id number]  Chief Complaint:  Chief Complaint    Follow-up     HPI: Stephanie [name] is a 74 year old Caucasian female, divorced, lives in [place] was evaluated by phone today.  Patient with history of bipolar disorder, coronary artery disease, fibromyalgia, GERD, hyperlipidemia, Raynaud's phenomenon, Sjogren's syndrome, hypothyroidism, IBS, recent fracture of neck after a fall , currently wears cervical collar.  Patient today reports she is doing well with regards to her mood.  She denies any significant mood lability.  She is compliant on all her medications.  Denies any significant side effects.  She stopped the mirtazapine per request of her primary care provider which has since resolved her abnormal movements around her mouth.  She currently denies any abnormal involuntary movements.  Patient reports sleep is good on the current medication regimen.  She currently takes  only 1 tablet of the hydroxyzine and has been using melatonin over-the-counter and they work together.  She wants to keep it that way since she does not want to take hydroxyzine too much and increase her risk of fall.  Patient reports she continues to have pain of her neck, currently wears a collar.  She reports she currently takes Tylenol and was advised to take up to 3000 mg/day to manage her pain.  Patient reports she however has been trying to go for walks.  She was able to walk 20 minutes today.  She does feel fatigued and tired more often however understands that she has multiple medical problems going on which could be contributing to that including her rheumatoid arthritis.  Patient denies any suicidal thoughts, homicidality or perceptual disturbances.  Patient appeared to be alert oriented to person place time and situation during the session.  Patient reports she will reach out to [name] and has upcoming appointment coming up on July 7.  She also has upcoming appointment with her primary care provider and she will get all her labs including her liver function repeated at that visit.   Visit Diagnosis:    ICD-10-CM   1. Bipolar disorder, in full remission, most recent episode manic ([place])  F31.74   2. GAD (generalized anxiety disorder)  F41.1   3. Insomnia due to mental disorder  F51.05   4. High risk medication use  Z79.899   5. Personal history of affective disorder  Z86.59    seasonal affective disorder    Past Psychiatric History: I have reviewed past psychiatric history from my  progress note on 06/17/2017  Past Medical History:  Past Medical History:  Diagnosis Date  . Anginal pain ([place])   . Anxiety   . Bipolar 1 disorder ([place])   . Bipolar disorder ([place])   . Coronary artery disease   . Depression   . Fatigue   . Fibromyalgia   . GERD (gastroesophageal reflux disease)   . Heart disease   . Heart failure ([place])   . HLD (hyperlipidemia)   . HOH (hard of  hearing)   . Hypothyroidism   . IBS (irritable bowel syndrome)   . Pelvic pain in female   . Perianal lesion    high grade sil lesion- keratinizing type  . RA (rheumatoid arthritis) ([place])   . Raynaud phenomenon   . Sjogren's disease ([place])   . Sleep apnea   . Vaginal Pap smear, abnormal     Past Surgical History:  Procedure Laterality Date  . CATARACT EXTRACTION W/PHACO Left 02/23/2017   Procedure: CATARACT EXTRACTION PHACO AND INTRAOCULAR LENS PLACEMENT (IOC);  Surgeon: Birder Robson, MD;  Location: [facility] ORS;  Service: Ophthalmology;  Laterality: Left;  [place] 00:35 AP% 9.6 CDE 3.47 Fluid pack lot # [id number]  . CATARACT EXTRACTION W/PHACO Right 03/30/2017   Procedure: CATARACT EXTRACTION PHACO AND INTRAOCULAR LENS PLACEMENT ([place]);  Surgeon: Birder Robson, MD;  Location: [facility] ORS;  Service: Ophthalmology;  Laterality: Right;  [place] 00:32.6 AP% 13.8 CDE 4.51 Fluid Pack Lot # [id number]  . EYE SURGERY    . TONSILLECTOMY      Family Psychiatric History: I have reviewed family psychiatric history from my progress note on 06/17/2017 Family History:  Family History  Problem Relation Age of Onset  . Heart failure Father   . Parkinson's disease Mother   . Bipolar disorder Mother   . Depression Sister   . Schizophrenia Paternal Grandmother   . Diabetes Maternal Grandmother   . Pancreatic cancer Maternal Grandmother   . Cancer Neg Hx   . Heart disease Neg Hx   . Breast cancer Neg Hx     Social History: I have reviewed social history from my progress note on 06/17/2017 Social History   Socioeconomic History  . Marital status: Divorced    Spouse name: Not on file  . Number of children: 2  . Years of education: Not on file  . Highest education level: Master's degree (e.g., MA, MS, MEng, MEd, MSW, MBA)  Occupational History    Comment: retired2  Tobacco Use  . Smoking status: Never Smoker  . Smokeless tobacco: Former Network engineer  . Vaping Use: Never used  Substance and  Sexual Activity  . Alcohol use: No    Alcohol/week: 0.0 standard drinks  . Drug use: No  . Sexual activity: Never    Birth control/protection: Post-menopausal  Other Topics Concern  . Not on file  Social History Narrative  . Not on file   Social Determinants of Health   Financial Resource Strain:   . Difficulty of Paying Living Expenses:   Food Insecurity:   . Worried About Charity fundraiser in the Last Year:   . Arboriculturist in the Last Year:   Transportation Needs:   . Film/video editor (Medical):   Marland Kitchen Lack of Transportation (Non-Medical):   Physical Activity:   . Days of Exercise per Week:   . Minutes of Exercise per Session:   Stress:   . Feeling of Stress :   Social Connections:   . Frequency  of Communication with Friends and Family:   . Frequency of Social Gatherings with Friends and Family:   . Attends Religious Services:   . Active Member of Clubs or Organizations:   . Attends Archivist Meetings:   Marland Kitchen Marital Status:     Allergies:  Allergies  Allergen Reactions  . Amoxicillin-Pot Clavulanate Nausea Only and Other (See Comments)    Diarrhea Has patient had a PCN reaction causing immediate rash, facial/tongue/throat swelling, SOB or lightheadedness with hypotension:  Has patient had a PCN reaction causing severe rash involving mucus membranes or skin necrosis: Has patient had a PCN reaction that required hospitalization:  Has patient had a PCN reaction occurring within the last 10 years: If all of the above answers are "NO", then may proceed with Cephalosporin use.   Marland Kitchen Oxycodone-Acetaminophen Nausea Only  . Erythromycin Nausea Only  . Azithromycin Nausea And Vomiting  . Codeine   . Macrolides And Ketolides   . Oxycodone-Acetaminophen   . Penicillins Other (See Comments)    Has patient had a PCN reaction causing immediate rash, facial/tongue/throat swelling, SOB or lightheadedness with hypotension: NO Has patient had a PCN reaction  causing severe rash involving mucus membranes or skin necrosis: NO Has patient had a PCN reaction that required hospitalization: NO Has patient had a PCN reaction occurring within the last 10 years: NO If all of the above answers are "NO", then may proceed with Cephalosporin use.   . Remeron [Mirtazapine]     Side effect-likely tardive dyskinesia  . Tramadol Nausea And Vomiting  . Lamotrigine Rash    LAMICTAL  . Sulfa Antibiotics Rash    Metabolic Disorder Labs: Lab Results  Component Value Date   HGBA1C 5.3 11/22/2017   Lab Results  Component Value Date   PROLACTIN 33.0 (H) 11/22/2017   Lab Results  Component Value Date   CHOL 219 (H) 11/22/2017   TRIG 105 11/22/2017   HDL 79 11/22/2017   LDLCALC 119 (H) 11/22/2017   No results found for: TSH  Therapeutic Level Labs: No results found for: LITHIUM Lab Results  Component Value Date   VALPROATE 61 07/05/2019   VALPROATE 77 11/08/2018   No components found for:  CBMZ  Current Medications: Current Outpatient Medications  Medication Sig Dispense Refill  . Abatacept (ORENCIA IV) Inject 1 Dose into the vein every 30 (thirty) days.    . Acetaminophen 500 MG capsule Take by mouth.    Marland Kitchen aspirin 81 MG chewable tablet Chew 81 mg by mouth daily with breakfast.    . Calcium Citrate-Vitamin D ([employer] CALCIUM CITRATE +D3 MINI) 200-250 MG-UNIT TABS Take 1 tablet by mouth daily after breakfast.     . carbamazepine (TEGRETOL XR) 200 MG 12 hr tablet Take 2 tablets (400 mg total) by mouth 2 (two) times daily. 360 tablet 1  . divalproex (DEPAKOTE ER) 500 MG 24 hr tablet Take 1 tablet twice a day 180 tablet 1  . fluticasone (FLONASE) 50 MCG/ACT nasal spray     . folic acid (FOLVITE) 1 MG tablet Take 2 mg by mouth daily with breakfast.     . gabapentin (NEURONTIN) 300 MG capsule Take 1 tablet twice a day 180 capsule 1  . hydrOXYzine (ATARAX/VISTARIL) 10 MG tablet Take 1-2 tablets (10-20 mg total) by mouth at bedtime as needed. for sleep 180  tablet 1  . ibandronate (BONIVA) 150 MG tablet     . levothyroxine (SYNTHROID, LEVOTHROID) 75 MCG tablet TAKE 1 TABLET BY MOUTH EACH  DAY ON EMPTYSTOMACH WITH A GLASS OF WATERAT LEAST 30 TO 60 MINUTES BEFORE BREAKFAST IN THE MORNING    . methotrexate 2.5 MG tablet Take 15 mg by mouth every Wednesday. WEDNESDAYS AT LUNCH    . mirabegron ER (MYRBETRIQ) 25 MG TB24 tablet Take 1 tablet (25 mg total) by mouth daily. 90 tablet 0  . mometasone (ELOCON) 0.1 % ointment Apply to fissure of right thumb BID until healed.    . Multiple Vitamins-Minerals (MULTIVITAMIN WITH MINERALS) tablet Take 1 tablet by mouth daily with breakfast.     . nitroGLYCERIN (NITROSTAT) 0.4 MG SL tablet Place 0.4 mg under the tongue every 5 (five) minutes as needed for chest pain.     Marland Kitchen ondansetron (ZOFRAN) 8 MG tablet TAKE 1 TABLET BY MOUTH EVERY 12 HOURS ASNEEDED FOR NAUSEA    . Polyethyl Glycol-Propyl Glycol (SYSTANE) 0.4-0.3 % GEL ophthalmic gel Place 1 application into both eyes 3 (three) times daily as needed (typically twice daily). Equate Brand     . pravastatin (PRAVACHOL) 40 MG tablet     . Propylene Glycol (SYSTANE BALANCE) 0.6 % SOLN Apply to eye.     No current facility-administered medications for this visit.     Musculoskeletal: Strength & Muscle Tone: UTA Gait & Station: UTA Patient leans: N/A  Psychiatric Specialty Exam: Review of Systems  Constitutional: Positive for fatigue.  Musculoskeletal: Positive for neck pain.       S/P fall - fracture , surgery   Psychiatric/Behavioral: Negative for agitation, behavioral problems, confusion, decreased concentration, dysphoric mood, hallucinations, self-injury, sleep disturbance and suicidal ideas. The patient is not nervous/anxious and is not hyperactive.   All other systems reviewed and are negative.   There were no vitals taken for this visit.There is no height or weight on file to calculate BMI.  General Appearance: UTA  Eye Contact:  UTA  Speech:  Normal  Rate  Volume:  Normal  Mood:  Euthymic  Affect:  UTA  Thought Process:  Goal Directed and Descriptions of Associations: Intact  Orientation:  Full (Time, Place, and Person)  Thought Content: Logical   Suicidal Thoughts:  No  Homicidal Thoughts:  No  Memory:  Immediate;   Fair Recent;   Fair Remote;   Fair  Judgement:  Fair  Insight:  Fair  Psychomotor Activity:  UTA  Concentration:  Concentration: Fair and Attention Span: Fair  Recall:  [employer] of Knowledge: Fair  Language: Fair  Akathisia:  No  Handed:  Right  AIMS (if indicated): UTA  Assets:  Communication Skills Desire for Improvement Housing Social Support  ADL's:  Intact  Cognition: WNL  Sleep:  Fair   Screenings: AIMS     Office Visit from 11/11/2017 in [place] Total Score 0       Assessment and Plan: ADALYND DONAHOE is a 74 year old Caucasian female, divorced, lives in [place] has a history of bipolar disorder and multiple medical problems including recent fracture, wears a cervical collar ,was evaluated by phone today.  Patient is currently doing well with regards to her mood symptoms.  Patient with multiple psychosocial stressors including her recent neck fracture, currently wearing cervical collar.  Discussed plan as noted below.  Plan Bipolar disorder in remission Tegretol extended release 400 mg p.o. twice daily Tegretol level on 07/05/2019--8.9 -therapeutic Depakote extended release 500 mg p.o. twice daily Depakote level on 07/05/2019-61 -therapeutic Gabapentin 300 mg p.o. twice daily  GAD-stable Continue CBT with [name]  Gabapentin 300 mg p.o. twice daily  Insomnia-stable Hydroxyzine 10 to 20 mg p.o. daily as needed for sleep Continue melatonin as needed.  High risk medication use-I have reviewed Tegretol level and Depakote level as summarized above and discussed with patient. CMP and liver-07/05/2019-within normal limits CBC with differential-MCV  slightly high at 104, MCH elevated at 34.7-otherwise within normal limits. Patient will benefit from repeat CMP and liver function since she is currently taking carbamazepine in combination with Tylenol.  Discussed with patient the effect of carbamazepine on her Tylenol including the risk of increased metabolism of Tylenol and reduced efficacy of Tylenol and also the risk of liver function abnormalities when using them in combination.  Patient reports she has upcoming appointment with her primary care provider to repeat her liver function.  Also patient will benefit from TSH which I do not see in the system recently, vitamin B12. Patient will continue to follow-up with her primary care provider for her abnormal labs.  She will sign a release and fax labs to [place] once she has it done at her primary care office.  Patient to continue psychotherapy sessions with [name].  Follow-up in clinic in 2-3 months or sooner if needed.  I have spent atleast 20 minutes non face to face with patient today. More than 50 % of the time was spent for preparing to see the patient ( e.g., review of test, records ), obtaining and to review and separately obtained history , ordering medications and test ,psychoeducation and supportive psychotherapy and care coordination,as well as documenting clinical information in electronic health record,interpreting results of test and communication of results This note was generated in part or whole with voice recognition software. Voice recognition is usually quite accurate but there are transcription errors that can and very often do occur. I apologize for any typographical errors that were not detected and corrected.        Ursula Alert, MD 09/06/2019, 12:12 PM

## 2019-09-06 NOTE — Telephone Encounter (Signed)
pt called ask if you can print her AVS and mail it too her.

## 2019-10-24 ENCOUNTER — Other Ambulatory Visit: Payer: Self-pay | Admitting: Nurse Practitioner

## 2019-10-24 DIAGNOSIS — S12201D Unspecified nondisplaced fracture of third cervical vertebra, subsequent encounter for fracture with routine healing: Secondary | ICD-10-CM

## 2019-10-27 ENCOUNTER — Ambulatory Visit
Admission: RE | Admit: 2019-10-27 | Discharge: 2019-10-27 | Disposition: A | Discharge status: Home / Self Care | Payer: Medicare PPO | Source: Ambulatory Visit | Attending: Nurse Practitioner | Admitting: Nurse Practitioner

## 2019-10-27 ENCOUNTER — Other Ambulatory Visit: Payer: Self-pay

## 2019-10-27 DIAGNOSIS — S12201D Unspecified nondisplaced fracture of third cervical vertebra, subsequent encounter for fracture with routine healing: Secondary | ICD-10-CM | POA: Diagnosis not present

## 2019-11-06 ENCOUNTER — Other Ambulatory Visit: Payer: Self-pay | Admitting: Urology

## 2019-11-22 ENCOUNTER — Telehealth (INDEPENDENT_AMBULATORY_CARE_PROVIDER_SITE_OTHER): Payer: Medicare PPO | Admitting: Psychiatry

## 2019-11-22 ENCOUNTER — Encounter: Payer: Self-pay | Admitting: Psychiatry

## 2019-11-22 ENCOUNTER — Other Ambulatory Visit: Payer: Self-pay

## 2019-11-22 DIAGNOSIS — Z79899 Other long term (current) drug therapy: Secondary | ICD-10-CM | POA: Diagnosis not present

## 2019-11-22 DIAGNOSIS — F411 Generalized anxiety disorder: Secondary | ICD-10-CM | POA: Diagnosis not present

## 2019-11-22 DIAGNOSIS — F5105 Insomnia due to other mental disorder: Secondary | ICD-10-CM

## 2019-11-22 DIAGNOSIS — F3174 Bipolar disorder, in full remission, most recent episode manic: Secondary | ICD-10-CM

## 2019-11-22 MED ORDER — DIVALPROEX SODIUM ER 500 MG PO TB24: ORAL_TABLET | ORAL | 1 refills | Status: DC

## 2019-11-22 MED ORDER — CARBAMAZEPINE ER 200 MG PO TB12: 400.0000 mg | ORAL_TABLET | Freq: Two times a day (BID) | ORAL | 1 refills | Status: DC

## 2019-11-22 MED ORDER — GABAPENTIN 300 MG PO CAPS: ORAL_CAPSULE | ORAL | 1 refills | Status: DC

## 2019-11-22 NOTE — Progress Notes (Signed)
07/19/19 10:45 AM   Stephanie [name] 1945-09-04 [id number]  Referring provider: Derinda Late, MD [phone] [address] [facility] [place],  [place] [address] Chief Complaint  Patient presents with  . Urinary Incontinence    HPI: Stephanie [name] is a 74 y.o. F who presents today for follow-up after being placed on Myrbetriq 25 mg for overactive bladder.    She was initially seen by Dr. Erlene Quan on Jul 19, 2019.  At that visit she reported frequency every 15 to 20 minutes associated with urge incontinence for the last several months.  Please see her note for detailed history.  They discussed behavioral modifications and she was given Myrbetriq 25 mg samples for 1 month.  In the interim, she has suffered a fracture of her third cervical vertebrae due to a mechanical fall.  At her visit on 08/23/2019, she stated the Myrbetriq had significantly decreased her daytime frequency and her urge incontinence.  Today, she is experiencing urgency x 8 or more (worse), frequency x 8 or more (worse), is restricting fluids to avoid visits to the restroom, is engaging in toilet mapping, incontinence x 8 or more (worse) and nocturia x 0-3 (stable).   Her BP is 122/66.   Her PVR is 92 mL.   She feels the Myrbetriq 25 mg daily is not effective as she had hoped.  She is wondering if she can increase the dose.  Patient denies any modifying or aggravating factors.  Patient denies any gross hematuria, dysuria or suprapubic/flank pain.  Patient denies any fevers, chills, nausea or vomiting.   Her water pill is so high that she is only showering 3 days a week.  She is wearing depends with a pad as well that she only changes on days she showers.  She does state that the incontinence materials do not get soaked with urine in the interim.  PMH: Past Medical History:  Diagnosis Date  . Anginal pain ([place])   . Anxiety   . Bipolar 1 disorder ([place])   . Bipolar disorder ([place])   . Coronary  artery disease   . Depression   . Fatigue   . Fibromyalgia   . GERD (gastroesophageal reflux disease)   . Heart disease   . Heart failure ([place])   . HLD (hyperlipidemia)   . HOH (hard of hearing)   . Hypothyroidism   . IBS (irritable bowel syndrome)   . Pelvic pain in female   . Perianal lesion    high grade sil lesion- keratinizing type  . RA (rheumatoid arthritis) ([place])   . Raynaud phenomenon   . Sjogren's disease ([place])   . Sleep apnea   . Vaginal Pap smear, abnormal     Surgical History: Past Surgical History:  Procedure Laterality Date  . CATARACT EXTRACTION W/PHACO Left 02/23/2017   Procedure: CATARACT EXTRACTION PHACO AND INTRAOCULAR LENS PLACEMENT (IOC);  Surgeon: Birder Robson, MD;  Location: [facility] ORS;  Service: Ophthalmology;  Laterality: Left;  [place] 00:35 AP% 9.6 CDE 3.47 Fluid pack lot # [id number]  . CATARACT EXTRACTION W/PHACO Right 03/30/2017   Procedure: CATARACT EXTRACTION PHACO AND INTRAOCULAR LENS PLACEMENT ([place]);  Surgeon: Birder Robson, MD;  Location: [facility] ORS;  Service: Ophthalmology;  Laterality: Right;  [place] 00:32.6 AP% 13.8 CDE 4.51 Fluid Pack Lot # [id number]  . EYE SURGERY    . TONSILLECTOMY      Home Medications:  Allergies as of 11/23/2019      Reactions  Amoxicillin-pot Clavulanate Nausea Only, Other (See Comments)   Diarrhea Has patient had a PCN reaction causing immediate rash, facial/tongue/throat swelling, SOB or lightheadedness with hypotension:  Has patient had a PCN reaction causing severe rash involving mucus membranes or skin necrosis: Has patient had a PCN reaction that required hospitalization:  Has patient had a PCN reaction occurring within the last 10 years: If all of the above answers are "NO", then may proceed with Cephalosporin use.   Oxycodone-acetaminophen Nausea Only   Erythromycin Nausea Only   Azithromycin Nausea And Vomiting   Codeine    Macrolides And Ketolides    Oxycodone-acetaminophen    Penicillins Other (See  Comments)   Has patient had a PCN reaction causing immediate rash, facial/tongue/throat swelling, SOB or lightheadedness with hypotension: NO Has patient had a PCN reaction causing severe rash involving mucus membranes or skin necrosis: NO Has patient had a PCN reaction that required hospitalization: NO Has patient had a PCN reaction occurring within the last 10 years: NO If all of the above answers are "NO", then may proceed with Cephalosporin use.   Remeron [mirtazapine]    Side effect-likely tardive dyskinesia   Tramadol Nausea And Vomiting   Lamotrigine Rash   LAMICTAL   Sulfa Antibiotics Rash      Medication List       Accurate as of November 23, 2019 10:45 AM. If you have any questions, ask your nurse or doctor.        Acetaminophen 500 MG capsule Take by mouth.   aspirin 81 MG chewable tablet Chew 81 mg by mouth daily with breakfast.   carbamazepine 200 MG 12 hr tablet Commonly known as: TEGRETOL XR Take 2 tablets (400 mg total) by mouth 2 (two) times daily.   [facility] Calcium Citrate +D3 Mini 200-250 MG-UNIT Tabs Generic drug: Calcium Citrate-Vitamin D Take 1 tablet by mouth daily after breakfast.   divalproex 500 MG 24 hr tablet Commonly known as: DEPAKOTE ER Take 1 tablet twice a day   fluticasone 50 MCG/ACT nasal spray Commonly known as: FLONASE   folic acid 1 MG tablet Commonly known as: FOLVITE Take 2 mg by mouth daily with breakfast.   gabapentin 300 MG capsule Commonly known as: NEURONTIN Take 1 tablet twice a day   hydrOXYzine 10 MG tablet Commonly known as: ATARAX/VISTARIL Take 1-2 tablets (10-20 mg total) by mouth at bedtime as needed. for sleep   ibandronate 150 MG tablet Commonly known as: BONIVA   levothyroxine 75 MCG tablet Commonly known as: SYNTHROID TAKE 1 TABLET BY MOUTH EACH DAY ON EMPTYSTOMACH WITH A GLASS OF WATERAT LEAST 30 TO 60 MINUTES BEFORE BREAKFAST IN THE MORNING   methotrexate 2.5 MG tablet Take 15 mg by mouth every  Wednesday. WEDNESDAYS AT LUNCH   mometasone 0.1 % ointment Commonly known as: ELOCON Apply to fissure of right thumb BID until healed.   multivitamin with minerals tablet Take 1 tablet by mouth daily with breakfast.   Myrbetriq 25 MG Tb24 tablet Generic drug: mirabegron ER TAKE 1 TABLET BY MOUTH ONCE A DAY   naproxen sodium 220 MG tablet Commonly known as: ALEVE Take by mouth.   Nitrostat 0.4 MG SL tablet Generic drug: nitroGLYCERIN Place 0.4 mg under the tongue every 5 (five) minutes as needed for chest pain.   ondansetron 8 MG tablet Commonly known as: ZOFRAN TAKE 1 TABLET BY MOUTH EVERY 12 HOURS ASNEEDED FOR NAUSEA   ORENCIA IV Inject 1 Dose into the vein every 30 (thirty) days.  pravastatin 40 MG tablet Commonly known as: PRAVACHOL   Systane 0.4-0.3 % Gel ophthalmic gel Generic drug: Polyethyl Glycol-Propyl Glycol Place 1 application into both eyes 3 (three) times daily as needed (typically twice daily). Equate Brand   Systane Balance 0.6 % Soln Generic drug: Propylene Glycol Apply to eye.       Allergies:  Allergies  Allergen Reactions  . Amoxicillin-Pot Clavulanate Nausea Only and Other (See Comments)    Diarrhea Has patient had a PCN reaction causing immediate rash, facial/tongue/throat swelling, SOB or lightheadedness with hypotension:  Has patient had a PCN reaction causing severe rash involving mucus membranes or skin necrosis: Has patient had a PCN reaction that required hospitalization:  Has patient had a PCN reaction occurring within the last 10 years: If all of the above answers are "NO", then may proceed with Cephalosporin use.   Marland Kitchen Oxycodone-Acetaminophen Nausea Only  . Erythromycin Nausea Only  . Azithromycin Nausea And Vomiting  . Codeine   . Macrolides And Ketolides   . Oxycodone-Acetaminophen   . Penicillins Other (See Comments)    Has patient had a PCN reaction causing immediate rash, facial/tongue/throat swelling, SOB or  lightheadedness with hypotension: NO Has patient had a PCN reaction causing severe rash involving mucus membranes or skin necrosis: NO Has patient had a PCN reaction that required hospitalization: NO Has patient had a PCN reaction occurring within the last 10 years: NO If all of the above answers are "NO", then may proceed with Cephalosporin use.   . Remeron [Mirtazapine]     Side effect-likely tardive dyskinesia  . Tramadol Nausea And Vomiting  . Lamotrigine Rash    LAMICTAL  . Sulfa Antibiotics Rash    Family History: Family History  Problem Relation Age of Onset  . Heart failure Father   . Parkinson's disease Mother   . Bipolar disorder Mother   . Depression Sister   . Schizophrenia Paternal Grandmother   . Diabetes Maternal Grandmother   . Pancreatic cancer Maternal Grandmother   . Cancer Neg Hx   . Heart disease Neg Hx   . Breast cancer Neg Hx     Social History:  reports that she has never smoked. She has quit using smokeless tobacco. She reports that she does not drink alcohol and does not use drugs.   Physical Exam: BP 122/66   Pulse 70   Ht 5' (1.524 m)   Wt 110 lb (49.9 kg)   BMI 21.48 kg/m   Constitutional:  Well nourished. Alert and oriented, No acute distress. HEENT: [place] AT, mask in place.  Trachea midline Cardiovascular: No clubbing, cyanosis, or edema. Respiratory: Normal respiratory effort, no increased work of breathing. Neurologic: Grossly intact, no focal deficits, moving all 4 extremities. Psychiatric: Normal mood and affect.   Laboratory Data: Lab Results  Component Value Date   CREATININE 0.54 (L) 07/05/2019  I have reviewed the labs.  Pertinent imaging Results for RHODESIA, STANGER (MRN [id number]) as of 11/23/2019 10:49  Ref. Range 11/23/2019 10:43  Scan Result Unknown 92 ML    Assessment & Plan:    1. Overactive bladder / urinary frequency We will increase the Myrbetriq to 50 mg daily, #28 samples are given RTC in 1 months for OAB  questionnaire and PVR  2.  Urge incontinence See above   [facility] [address] [place], [place] [address] [phone]  Zara Council, PA-C

## 2019-11-22 NOTE — Progress Notes (Signed)
Provider Location : [facility] Patient Location : Home  Participants: Patient , Provider  Virtual Visit via Telephone Note  I connected with [name] on 11/22/19 at 10:20 AM EDT by telephone and verified that I am speaking with the correct person using two identifiers.   I discussed the limitations, risks, security and privacy concerns of performing an evaluation and management service by telephone and the availability of in person appointments. I also discussed with the patient that there may be a patient responsible charge related to this service. The patient expressed understanding and agreed to proceed.   I discussed the assessment and treatment plan with the patient. The patient was provided an opportunity to ask questions and all were answered. The patient agreed with the plan and demonstrated an understanding of the instructions.   The patient was advised to call back or seek an in-person evaluation if the symptoms worsen or if the condition fails to improve as anticipated.   [place] MD OP Progress Note  11/22/2019 12:51 PM DHALIA ZINGARO  MRN:  [id number]  Chief Complaint:  Chief Complaint    Follow-up     HPI: Stephanie [name] is a 74 year old Caucasian female, divorced, lives in [place] was evaluated by phone today.  Patient with history of bipolar disorder, coronary artery disease, fibromyalgia, GERD, hyperlipidemia, Raynaud's phenomenon, Sjogren's syndrome, hypothyroidism, IBS, recent fracture of the neck after a fall.  Patient today reports she is currently doing well with regards to her mood symptoms.  She denies any significant mood lability.  She denies any significant anxiety.  She appeared to be alert, oriented to person place time and situation.  She was able to answer all questions appropriately.  Her attention and focus appeared to be good and she had good recall and no memory changes were noted.  She reports she is sleeping okay.  She reports melatonin as well as  hydroxyzine combination helps her to sleep.  She reports she is trying to limit the use of hydroxyzine and uses 10 mg orally.  She reports her neck pain is much better and she is recovering well.  She currently does not use Tylenol much and is down to 500 to 1000 mg daily as needed.  She also does not have to use a neck collar anymore.  She reports she has started going to the gym as well as has been going to church, socializing with couple of  Friends.  Patient denies any side effects to medications and reports her abnormal movements around the mouth has resolved.  Patient denies any suicidality, homicidality or perceptual disturbances.  Patient continues to be in psychotherapy sessions and reports therapy sessions as beneficial.  She denies any other concerns today.  Visit Diagnosis:    ICD-10-CM   1. Bipolar disorder, in full remission, most recent episode manic (HCC)  F31.74 carbamazepine (TEGRETOL XR) 200 MG 12 hr tablet    divalproex (DEPAKOTE ER) 500 MG 24 hr tablet    gabapentin (NEURONTIN) 300 MG capsule  2. GAD (generalized anxiety disorder)  F41.1   3. Insomnia due to mental disorder  F51.05   4. High risk medication use  Z79.899     Past Psychiatric History: I have reviewed past psychiatric history from my progress note on 06/17/2017  Past Medical History:  Past Medical History:  Diagnosis Date  . Anginal pain ([place])   . Anxiety   . Bipolar 1 disorder ([place])   . Bipolar disorder ([place])   . Coronary artery  disease   . Depression   . Fatigue   . Fibromyalgia   . GERD (gastroesophageal reflux disease)   . Heart disease   . Heart failure ([place])   . HLD (hyperlipidemia)   . HOH (hard of hearing)   . Hypothyroidism   . IBS (irritable bowel syndrome)   . Pelvic pain in female   . Perianal lesion    high grade sil lesion- keratinizing type  . RA (rheumatoid arthritis) ([place])   . Raynaud phenomenon   . Sjogren's disease ([place])   . Sleep apnea   . Vaginal Pap smear,  abnormal     Past Surgical History:  Procedure Laterality Date  . CATARACT EXTRACTION W/PHACO Left 02/23/2017   Procedure: CATARACT EXTRACTION PHACO AND INTRAOCULAR LENS PLACEMENT (IOC);  Surgeon: Birder Robson, MD;  Location: [facility] ORS;  Service: Ophthalmology;  Laterality: Left;  [place] 00:35 AP% 9.6 CDE 3.47 Fluid pack lot # [id number]  . CATARACT EXTRACTION W/PHACO Right 03/30/2017   Procedure: CATARACT EXTRACTION PHACO AND INTRAOCULAR LENS PLACEMENT ([place]);  Surgeon: Birder Robson, MD;  Location: [facility] ORS;  Service: Ophthalmology;  Laterality: Right;  [place] 00:32.6 AP% 13.8 CDE 4.51 Fluid Pack Lot # [id number]  . EYE SURGERY    . TONSILLECTOMY      Family Psychiatric History: I have reviewed family psychiatric history from my progress note on 06/17/2017  Family History:  Family History  Problem Relation Age of Onset  . Heart failure Father   . Parkinson's disease Mother   . Bipolar disorder Mother   . Depression Sister   . Schizophrenia Paternal Grandmother   . Diabetes Maternal Grandmother   . Pancreatic cancer Maternal Grandmother   . Cancer Neg Hx   . Heart disease Neg Hx   . Breast cancer Neg Hx     Social History: I have reviewed social history from my progress note on 06/17/2017 Social History   Socioeconomic History  . Marital status: Divorced    Spouse name: Not on file  . Number of children: 2  . Years of education: Not on file  . Highest education level: Master's degree (e.g., MA, MS, MEng, MEd, MSW, MBA)  Occupational History    Comment: retired2  Tobacco Use  . Smoking status: Never Smoker  . Smokeless tobacco: Former Network engineer  . Vaping Use: Never used  Substance and Sexual Activity  . Alcohol use: No    Alcohol/week: 0.0 standard drinks  . Drug use: No  . Sexual activity: Never    Birth control/protection: Post-menopausal  Other Topics Concern  . Not on file  Social History Narrative  . Not on file   Social Determinants of Health    Financial Resource Strain:   . Difficulty of Paying Living Expenses: Not on file  Food Insecurity:   . Worried About Charity fundraiser in the Last Year: Not on file  . Ran Out of Food in the Last Year: Not on file  Transportation Needs:   . Lack of Transportation (Medical): Not on file  . Lack of Transportation (Non-Medical): Not on file  Physical Activity:   . Days of Exercise per Week: Not on file  . Minutes of Exercise per Session: Not on file  Stress:   . Feeling of Stress : Not on file  Social Connections:   . Frequency of Communication with Friends and Family: Not on file  . Frequency of Social Gatherings with Friends and Family: Not on file  . Attends  Religious Services: Not on file  . Active Member of Clubs or Organizations: Not on file  . Attends Archivist Meetings: Not on file  . Marital Status: Not on file    Allergies:  Allergies  Allergen Reactions  . Amoxicillin-Pot Clavulanate Nausea Only and Other (See Comments)    Diarrhea Has patient had a PCN reaction causing immediate rash, facial/tongue/throat swelling, SOB or lightheadedness with hypotension:  Has patient had a PCN reaction causing severe rash involving mucus membranes or skin necrosis: Has patient had a PCN reaction that required hospitalization:  Has patient had a PCN reaction occurring within the last 10 years: If all of the above answers are "NO", then may proceed with Cephalosporin use.   Marland Kitchen Oxycodone-Acetaminophen Nausea Only  . Erythromycin Nausea Only  . Azithromycin Nausea And Vomiting  . Codeine   . Macrolides And Ketolides   . Oxycodone-Acetaminophen   . Penicillins Other (See Comments)    Has patient had a PCN reaction causing immediate rash, facial/tongue/throat swelling, SOB or lightheadedness with hypotension: NO Has patient had a PCN reaction causing severe rash involving mucus membranes or skin necrosis: NO Has patient had a PCN reaction that required hospitalization:  NO Has patient had a PCN reaction occurring within the last 10 years: NO If all of the above answers are "NO", then may proceed with Cephalosporin use.   . Remeron [Mirtazapine]     Side effect-likely tardive dyskinesia  . Tramadol Nausea And Vomiting  . Lamotrigine Rash    LAMICTAL  . Sulfa Antibiotics Rash    Metabolic Disorder Labs: Lab Results  Component Value Date   HGBA1C 5.3 11/22/2017   Lab Results  Component Value Date   PROLACTIN 33.0 (H) 11/22/2017   Lab Results  Component Value Date   CHOL 219 (H) 11/22/2017   TRIG 105 11/22/2017   HDL 79 11/22/2017   LDLCALC 119 (H) 11/22/2017   No results found for: TSH  Therapeutic Level Labs: No results found for: LITHIUM Lab Results  Component Value Date   VALPROATE 61 07/05/2019   VALPROATE 77 11/08/2018   No components found for:  CBMZ  Current Medications: Current Outpatient Medications  Medication Sig Dispense Refill  . Abatacept (ORENCIA IV) Inject 1 Dose into the vein every 30 (thirty) days.    . Acetaminophen 500 MG capsule Take by mouth.    Marland Kitchen aspirin 81 MG chewable tablet Chew 81 mg by mouth daily with breakfast.    . Calcium Citrate-Vitamin D ([employer]S CALCIUM CITRATE +D3 MINI) 200-250 MG-UNIT TABS Take 1 tablet by mouth daily after breakfast.     . carbamazepine (TEGRETOL XR) 200 MG 12 hr tablet Take 2 tablets (400 mg total) by mouth 2 (two) times daily. 360 tablet 1  . divalproex (DEPAKOTE ER) 500 MG 24 hr tablet Take 1 tablet twice a day 180 tablet 1  . fluticasone (FLONASE) 50 MCG/ACT nasal spray     . folic acid (FOLVITE) 1 MG tablet Take 2 mg by mouth daily with breakfast.     . gabapentin (NEURONTIN) 300 MG capsule Take 1 tablet twice a day 180 capsule 1  . hydrOXYzine (ATARAX/VISTARIL) 10 MG tablet Take 1-2 tablets (10-20 mg total) by mouth at bedtime as needed. for sleep 180 tablet 1  . ibandronate (BONIVA) 150 MG tablet     . levothyroxine (SYNTHROID, LEVOTHROID) 75 MCG tablet TAKE 1 TABLET BY MOUTH  EACH DAY ON EMPTYSTOMACH WITH A GLASS OF WATERAT LEAST 30 TO 60 MINUTES  BEFORE BREAKFAST IN THE MORNING    . methotrexate 2.5 MG tablet Take 15 mg by mouth every Wednesday. WEDNESDAYS AT LUNCH    . mometasone (ELOCON) 0.1 % ointment Apply to fissure of right thumb BID until healed.    . Multiple Vitamins-Minerals (MULTIVITAMIN WITH MINERALS) tablet Take 1 tablet by mouth daily with breakfast.     . MYRBETRIQ 25 MG TB24 tablet TAKE 1 TABLET BY MOUTH ONCE A DAY 90 tablet 0  . naproxen sodium (ALEVE) 220 MG tablet Take by mouth.    . nitroGLYCERIN (NITROSTAT) 0.4 MG SL tablet Place 0.4 mg under the tongue every 5 (five) minutes as needed for chest pain.     Marland Kitchen ondansetron (ZOFRAN) 8 MG tablet TAKE 1 TABLET BY MOUTH EVERY 12 HOURS ASNEEDED FOR NAUSEA    . Polyethyl Glycol-Propyl Glycol (SYSTANE) 0.4-0.3 % GEL ophthalmic gel Place 1 application into both eyes 3 (three) times daily as needed (typically twice daily). Equate Brand     . pravastatin (PRAVACHOL) 40 MG tablet     . Propylene Glycol (SYSTANE BALANCE) 0.6 % SOLN Apply to eye.     No current facility-administered medications for this visit.     Musculoskeletal: Strength & Muscle Tone: UTA Gait & Station: UTA Patient leans: N/A  Psychiatric Specialty Exam: Review of Systems  Psychiatric/Behavioral: Negative for agitation, behavioral problems, confusion, decreased concentration, dysphoric mood, hallucinations, self-injury, sleep disturbance and suicidal ideas. The patient is not nervous/anxious and is not hyperactive.   All other systems reviewed and are negative.   There were no vitals taken for this visit.There is no height or weight on file to calculate BMI.  General Appearance: Casual  Eye Contact:  Fair  Speech:  Normal Rate  Volume:  Normal  Mood:  Euthymic  Affect:  Congruent  Thought Process:  Goal Directed and Descriptions of Associations: Intact  Orientation:  Full (Time, Place, and Person)  Thought Content: Logical    Suicidal Thoughts:  No  Homicidal Thoughts:  No  Memory:  Immediate;   Fair Recent;   Fair Remote;   Fair  Judgement:  Fair  Insight:  Fair  Psychomotor Activity:  UTA  Concentration:  Concentration: Fair and Attention Span: Fair  Recall:  [employer] of Knowledge: Fair  Language: Fair  Akathisia:  No  Handed:  Right  AIMS (if indicated): UTA  Assets:  Communication Skills Desire for Improvement Housing Social Support  ADL's:  Intact  Cognition: WNL  Sleep:  Fair   Screenings: AIMS     Office Visit from 11/11/2017 in [facility] Total Score 0       Assessment and Plan: ANNIELEE JEMMOTT is a 74 year old Caucasian female, divorced, lives in [place], has a history of bipolar disorder and multiple medical problems including recent fracture was evaluated by phone today.  Patient is currently doing well.  Plan as noted below.  Plan Bipolar disorder in remission Tegretol extended release 400 mg p.o. twice daily Tegretol level 07/05/2019-8.9-therapeutic Depakote extended release 500 mg p.o. twice daily Depakote level on 07/05/2018 21-61-therapeutic Gabapentin 300 mg p.o. twice daily  GAD-stable Continue CBT with [name] Gabapentin 300 mg p.o. twice daily  Insomnia-stable Hydroxyzine 10 to 20 mg p.o. daily as needed for sleep Continue melatonin as needed  High risk medication use-patient has been using Tylenol since her recent fall, in high dosages.  Patient however reports she is currently cutting back.  Discussed with patient the effect of  combining medications like Tylenol with Depakote and Tegretol and its effect on her liver function.  Discussed with her to repeat CMP-liver function test as needed.  She could also monitor her symptoms closely and talk to primary care provider as needed.  Follow-up in clinic in 3 months or sooner if needed.  I have spent atleast 20 minutes non face to face with patient today. More than 50 % of the  time was spent for preparing to see the patient ( e.g., review of test, records ), ordering medications and test ,psychoeducation and supportive psychotherapy and care coordination,as well as documenting clinical information in electronic health record. This note was generated in part or whole with voice recognition software. Voice recognition is usually quite accurate but there are transcription errors that can and very often do occur. I apologize for any typographical errors that were not detected and corrected.      Ursula Alert, MD 11/22/2019, 12:51 PM

## 2019-11-23 ENCOUNTER — Encounter: Payer: Self-pay | Admitting: Urology

## 2019-11-23 ENCOUNTER — Ambulatory Visit: Payer: Medicare PPO | Admitting: Urology

## 2019-11-23 VITALS — BP 122/66 | HR 70 | Ht 60.0 in | Wt 110.0 lb

## 2019-11-23 DIAGNOSIS — R350 Frequency of micturition: Secondary | ICD-10-CM

## 2019-11-23 DIAGNOSIS — N3941 Urge incontinence: Secondary | ICD-10-CM | POA: Diagnosis not present

## 2019-11-23 LAB — BLADDER SCAN AMB NON-IMAGING: Scan Result: 92

## 2019-11-23 MED ORDER — MIRABEGRON ER 50 MG PO TB24
50.0000 mg | ORAL_TABLET | Freq: Every day | ORAL | 0 refills | Status: DC
Start: 2019-11-23 — End: 2019-12-21

## 2019-12-20 NOTE — Progress Notes (Signed)
07/19/19 9:40 AM   Stephanie [name] 1945/12/25 [id number]  Referring provider: Derinda Late, MD [phone] [facility] [place],  [place] [address] Chief Complaint  Patient presents with  . Urinary Frequency    HPI: Stephanie [name] is a 74 y.o. F who presents today for follow-up after having an increase of her Myrbetriq from 25-50 for overactive bladder.      She was initially seen by Dr. Erlene Quan on Jul 19, 2019.  At that visit she reported frequency every 15 to 20 minutes associated with urge incontinence for the last several months.  Please see her note for detailed history.  They discussed behavioral modifications and she was given Myrbetriq 25 mg samples for 1 month.  In the interim, she has suffered a fracture of her third cervical vertebrae due to a mechanical fall.  At her visit on 08/23/2019, she stated the Myrbetriq had significantly decreased her daytime frequency and her urge incontinence.  At her visit in September 2021, she felt that the Myrbetriq 25 mg was not as effective as she was hoping.  She was then increased to 50 mg of Myrbetriq daily.    Today, she is experiencing urgency x 0-3 (improved), frequency x 4-7 (improved), is not restricting fluids to avoid visits to the restroom, is engaging in toilet mapping, incontinence x 4-7 (improved) and nocturia x 0-3 (stable).   Her BP is 130/80.   Her PVR is 100 mL.    Biggest improvement is no nocturia with the Myrbetriq 50 mg.    Patient denies any modifying or aggravating factors.  Patient denies any gross hematuria, dysuria or suprapubic/flank pain.  Patient denies any fevers, chills, nausea or vomiting.   Her water pill is so high that she is only showering 3 days a week.  She is wearing depends with a pad as well that she only changes on days she showers.  She does state that the incontinence materials do not get soaked with urine in the interim.  PMH: Past Medical History:   Diagnosis Date  . Anginal pain ([place])   . Anxiety   . Bipolar 1 disorder ([place])   . Bipolar disorder ([place])   . Coronary artery disease   . Depression   . Fatigue   . Fibromyalgia   . GERD (gastroesophageal reflux disease)   . Heart disease   . Heart failure ([place])   . HLD (hyperlipidemia)   . HOH (hard of hearing)   . Hypothyroidism   . IBS (irritable bowel syndrome)   . Pelvic pain in female   . Perianal lesion    high grade sil lesion- keratinizing type  . RA (rheumatoid arthritis) ([place])   . Raynaud phenomenon   . Sjogren's disease ([place])   . Sleep apnea   . Vaginal Pap smear, abnormal     Surgical History: Past Surgical History:  Procedure Laterality Date  . CATARACT EXTRACTION W/PHACO Left 02/23/2017   Procedure: CATARACT EXTRACTION PHACO AND INTRAOCULAR LENS PLACEMENT (IOC);  Surgeon: Birder Robson, MD;  Location: [facility] ORS;  Service: Ophthalmology;  Laterality: Left;  [place] 00:35 AP% 9.6 CDE 3.47 Fluid pack lot # [id number]  . CATARACT EXTRACTION W/PHACO Right 03/30/2017   Procedure: CATARACT EXTRACTION PHACO AND INTRAOCULAR LENS PLACEMENT ([place]);  Surgeon: Birder Robson, MD;  Location: [facility] ORS;  Service: Ophthalmology;  Laterality: Right;  [place] 00:32.6 AP% 13.8 CDE 4.51 Fluid Pack Lot # [id number]  . EYE SURGERY    .  TONSILLECTOMY      Home Medications:  Allergies as of 12/21/2019      Reactions   Amoxicillin-pot Clavulanate Nausea Only, Other (See Comments)   Diarrhea Has patient had a PCN reaction causing immediate rash, facial/tongue/throat swelling, SOB or lightheadedness with hypotension:  Has patient had a PCN reaction causing severe rash involving mucus membranes or skin necrosis: Has patient had a PCN reaction that required hospitalization:  Has patient had a PCN reaction occurring within the last 10 years: If all of the above answers are "NO", then may proceed with Cephalosporin use.   Oxycodone-acetaminophen Nausea Only   Erythromycin Nausea Only    Azithromycin Nausea And Vomiting   Codeine    Macrolides And Ketolides    Oxycodone-acetaminophen    Penicillins Other (See Comments)   Has patient had a PCN reaction causing immediate rash, facial/tongue/throat swelling, SOB or lightheadedness with hypotension: NO Has patient had a PCN reaction causing severe rash involving mucus membranes or skin necrosis: NO Has patient had a PCN reaction that required hospitalization: NO Has patient had a PCN reaction occurring within the last 10 years: NO If all of the above answers are "NO", then may proceed with Cephalosporin use.   Remeron [mirtazapine]    Side effect-likely tardive dyskinesia   Tramadol Nausea And Vomiting   Lamotrigine Rash   LAMICTAL   Sulfa Antibiotics Rash      Medication List       Accurate as of December 21, 2019  9:40 AM. If you have any questions, ask your nurse or doctor.        STOP taking these medications   hydrOXYzine 10 MG tablet Commonly known as: ATARAX/VISTARIL Stopped by: Dornell Grasmick, PA-C   mometasone 0.1 % ointment Commonly known as: ELOCON Stopped by: Rosebud Koenen, PA-C   naproxen sodium 220 MG tablet Commonly known as: ALEVE Stopped by: Julie-Anne Torain, PA-C   Nitrostat 0.4 MG SL tablet Generic drug: nitroGLYCERIN Stopped by: Adain Geurin, PA-C   ondansetron 8 MG tablet Commonly known as: ZOFRAN Stopped by: Nakyla Bracco, PA-C   Systane 0.4-0.3 % Gel ophthalmic gel Generic drug: Polyethyl Glycol-Propyl Glycol Stopped by: Joylene Wescott, PA-C   Systane Balance 0.6 % Soln Generic drug: Propylene Glycol Stopped by: Ona Rathert, PA-C     TAKE these medications   Acetaminophen 500 MG capsule Take by mouth.   aspirin 81 MG chewable tablet Chew 81 mg by mouth daily with breakfast.   carbamazepine 200 MG 12 hr tablet Commonly known as: TEGRETOL XR Take 2 tablets (400 mg total) by mouth 2 (two) times daily.   [facility] Calcium Citrate +D3 Mini 200-250 MG-UNIT  Tabs Generic drug: Calcium Citrate-Vitamin D Take 1 tablet by mouth daily after breakfast.   divalproex 500 MG 24 hr tablet Commonly known as: DEPAKOTE ER Take 1 tablet twice a day   fluticasone 50 MCG/ACT nasal spray Commonly known as: FLONASE   folic acid 1 MG tablet Commonly known as: FOLVITE Take 2 mg by mouth daily with breakfast.   gabapentin 300 MG capsule Commonly known as: NEURONTIN Take 1 tablet twice a day   ibandronate 150 MG tablet Commonly known as: BONIVA   levothyroxine 75 MCG tablet Commonly known as: SYNTHROID TAKE 1 TABLET BY MOUTH EACH DAY ON EMPTYSTOMACH WITH A GLASS OF WATERAT LEAST 30 TO 60 MINUTES BEFORE BREAKFAST IN THE MORNING   methotrexate 2.5 MG tablet Take 15 mg by mouth every Wednesday. WEDNESDAYS AT LUNCH   multivitamin with minerals  tablet Take 1 tablet by mouth daily with breakfast.   Myrbetriq 25 MG Tb24 tablet Generic drug: mirabegron ER What changed: Another medication with the same name was removed. Continue taking this medication, and follow the directions you see here. Changed by: Zara Council, PA-C   ORENCIA IV Inject 1 Dose into the vein every 30 (thirty) days.   pravastatin 40 MG tablet Commonly known as: PRAVACHOL       Allergies:  Allergies  Allergen Reactions  . Amoxicillin-Pot Clavulanate Nausea Only and Other (See Comments)    Diarrhea Has patient had a PCN reaction causing immediate rash, facial/tongue/throat swelling, SOB or lightheadedness with hypotension:  Has patient had a PCN reaction causing severe rash involving mucus membranes or skin necrosis: Has patient had a PCN reaction that required hospitalization:  Has patient had a PCN reaction occurring within the last 10 years: If all of the above answers are "NO", then may proceed with Cephalosporin use.   Marland Kitchen Oxycodone-Acetaminophen Nausea Only  . Erythromycin Nausea Only  . Azithromycin Nausea And Vomiting  . Codeine   . Macrolides And Ketolides   .  Oxycodone-Acetaminophen   . Penicillins Other (See Comments)    Has patient had a PCN reaction causing immediate rash, facial/tongue/throat swelling, SOB or lightheadedness with hypotension: NO Has patient had a PCN reaction causing severe rash involving mucus membranes or skin necrosis: NO Has patient had a PCN reaction that required hospitalization: NO Has patient had a PCN reaction occurring within the last 10 years: NO If all of the above answers are "NO", then may proceed with Cephalosporin use.   . Remeron [Mirtazapine]     Side effect-likely tardive dyskinesia  . Tramadol Nausea And Vomiting  . Lamotrigine Rash    LAMICTAL  . Sulfa Antibiotics Rash    Family History: Family History  Problem Relation Age of Onset  . Heart failure Father   . Parkinson's disease Mother   . Bipolar disorder Mother   . Depression Sister   . Schizophrenia Paternal Grandmother   . Diabetes Maternal Grandmother   . Pancreatic cancer Maternal Grandmother   . Cancer Neg Hx   . Heart disease Neg Hx   . Breast cancer Neg Hx     Social History:  reports that she has never smoked. She has quit using smokeless tobacco. She reports that she does not drink alcohol and does not use drugs.  ROS For pertinent review of systems please refer to history of present illness  Physical Exam: BP 130/80   Pulse 75   Wt 110 lb (49.9 kg)   BMI 21.48 kg/m   Constitutional:  Well nourished. Alert and oriented, No acute distress. HEENT: [place] AT, mask in place.  Trachea midline Cardiovascular: No clubbing, cyanosis, or edema. Respiratory: Normal respiratory effort, no increased work of breathing. Neurologic: Grossly intact, no focal deficits, moving all 4 extremities.  In wheelchair Psychiatric: Normal mood and affect.   Laboratory Data: Lab Results  Component Value Date   CREATININE 0.54 (L) 07/05/2019  I have reviewed the labs.  Pertinent imaging Results for Stephanie, [name] (MRN [id number]) as of  12/21/2019 09:52  Ref. Range 12/21/2019 09:17  Scan Result Unknown 100 ML     Assessment & Plan:    1. Overactive bladder / urinary frequency Patient has found that the increase to the Myrbetriq 50 mg daily has resulted in a great improvement in her urinary symptoms the biggest being she is not getting up at night and as  an individual with bipolar disease is important that she can sleep when she can She will continue Myrbetriq 50 mg daily, but she will use of her Myrbetriq 25 mg daily by taking 2 of those tablets and will call us when she is in need of her Myrbetriq 50 mg daily prescription She will return in 6 months for OAB questionnaire and PVR  2.  Urge incontinence See above   [facility] [address] [place], [place] [address] [phone]  Zara Council, PA-C

## 2019-12-21 ENCOUNTER — Encounter: Payer: Self-pay | Admitting: Urology

## 2019-12-21 ENCOUNTER — Other Ambulatory Visit: Payer: Self-pay

## 2019-12-21 ENCOUNTER — Ambulatory Visit: Payer: Medicare PPO | Admitting: Urology

## 2019-12-21 VITALS — BP 130/80 | HR 75 | Wt 110.0 lb

## 2019-12-21 DIAGNOSIS — N3281 Overactive bladder: Secondary | ICD-10-CM | POA: Diagnosis not present

## 2019-12-21 DIAGNOSIS — N3941 Urge incontinence: Secondary | ICD-10-CM | POA: Diagnosis not present

## 2019-12-21 LAB — BLADDER SCAN AMB NON-IMAGING: Scan Result: 100

## 2019-12-27 ENCOUNTER — Telehealth: Payer: Self-pay

## 2019-12-27 DIAGNOSIS — F5105 Insomnia due to other mental disorder: Secondary | ICD-10-CM

## 2019-12-27 DIAGNOSIS — F411 Generalized anxiety disorder: Secondary | ICD-10-CM

## 2019-12-27 DIAGNOSIS — F3111 Bipolar disorder, current episode manic without psychotic features, mild: Secondary | ICD-10-CM

## 2019-12-27 MED ORDER — ESZOPICLONE 1 MG PO TABS: 1.0000 mg | ORAL_TABLET | Freq: Every evening | ORAL | 1 refills | Status: DC | PRN

## 2019-12-27 NOTE — Telephone Encounter (Signed)
Returned call to patient per communication from therapist. Pt reports current manic symptoms , sleep problems. Discussed Vraylar, patient declines due to concerns of weight gain. Discussed Lunesta for sleep - she is agreeable. Provided medication education including fall risk. Discussed to stop melatonin and vistaril ( atarax).

## 2019-12-27 NOTE — Telephone Encounter (Signed)
Stephanie Dibble, LCSW called wanted to let you know that pt not sleeping and she feels like she is manic, having anxiety,

## 2019-12-29 ENCOUNTER — Ambulatory Visit: Payer: Medicare PPO | Attending: Rheumatology | Admitting: Occupational Therapy

## 2020-01-01 ENCOUNTER — Ambulatory Visit: Payer: Self-pay | Admitting: Podiatry

## 2020-01-01 ENCOUNTER — Telehealth: Payer: Self-pay

## 2020-01-01 DIAGNOSIS — F3111 Bipolar disorder, current episode manic without psychotic features, mild: Secondary | ICD-10-CM

## 2020-01-01 DIAGNOSIS — F5105 Insomnia due to other mental disorder: Secondary | ICD-10-CM

## 2020-01-01 MED ORDER — ESZOPICLONE 1 MG PO TABS: 1.0000 mg | ORAL_TABLET | Freq: Every evening | ORAL | 1 refills | Status: DC | PRN

## 2020-01-01 MED ORDER — CARIPRAZINE HCL 1.5 MG PO CAPS: 1.5000 mg | ORAL_CAPSULE | Freq: Every day | ORAL | 1 refills | Status: DC

## 2020-01-01 NOTE — Telephone Encounter (Signed)
prior authorization needed for the vraylar 1.5mg 

## 2020-01-01 NOTE — Telephone Encounter (Signed)
went online to [email or web address] and submitted the prior auth.  it was approved from 03-10-19 to  03-08-2021 case # [id number] approved.

## 2020-01-01 NOTE — Telephone Encounter (Signed)
Returned call to patient at 12:45 PM.   Patient reports she continues to not sleep and would like more medication changes since she feels she is also manic.  She is interested in getting back on Vraylar.  She is also agreeable to increasing the Lunesta to higher dosage.  She reports she has also gotten in touch with her friends and family to get more support.  She is agreeable to going to the nearest emergency department if she continues to decompensate.  She did not verbalize any perceptual disturbances, suicidality.  She appeared to be alert, oriented and was able to answer questions appropriately.  Writer returned call to patient's friend who had left a voicemail message-[name] at [phone].  Without releasing personal information about medical problems and recent medication changes writer was able to obtain collateral information from her friend.  Per [name]-she believes patient needs more support and she has contacted patient's son as well.  [name] reports patient did tell her that she is getting more manic and not sleeping.  However Lelon Frohlich reports that when she visited patient a week ago she did feel like she had some tongue thrusting movement.  [name] agrees to get patient to the nearest emergency department if she continues to not do well.  Writer called patient back again to discuss patient's friend's concern about her abnormal tongue thrusting movements.  Patient agrees to monitor this closely and reports she was not aware of this.  She however would like to give Vraylar a trial.  She agrees to get in touch with her family for more support.  She agrees to go to the nearest emergency department if she continues to decompensate.  We will start Vraylar 1.5 mg p.o. nightly Will increase Lunesta to 2 mg at bedtime, start tomorrow, if she does not sleep well on the Vraylar and the Lunesta 1 mg tonight.  Discussed with patient that she can be started on a medication called Ingrezza for TD. She will Biochemist, clinical  know. She was also offered a referral to a neurologist previously however she had declined that in the past.

## 2020-01-01 NOTE — Telephone Encounter (Signed)
faxed and confirmed approval notice.

## 2020-01-01 NOTE — Telephone Encounter (Signed)
ann called states that pt is not doing well at all and needs some help she is manic and she not sleeping please call her at [phone]

## 2020-01-01 NOTE — Telephone Encounter (Signed)
pt called states that she not doing well on the lunesta pt states she not sleeping and she having a mental meltdown, pt want to try vraylar but she worried about weight gain.

## 2020-01-02 ENCOUNTER — Encounter: Payer: Self-pay | Admitting: Psychiatry

## 2020-01-02 ENCOUNTER — Emergency Department
Admission: EM | Admit: 2020-01-02 | Discharge: 2020-01-02 | Disposition: A | Discharge status: Home / Self Care | Payer: Medicare PPO | Attending: Emergency Medicine | Admitting: Emergency Medicine

## 2020-01-02 ENCOUNTER — Other Ambulatory Visit: Payer: Self-pay

## 2020-01-02 ENCOUNTER — Inpatient Hospital Stay
Admission: RE | Admit: 2020-01-02 | Discharge: 2020-01-11 | DRG: 885 | Disposition: A | Discharge status: Home / Self Care | Payer: Medicare PPO | Source: Intra-hospital | Attending: Behavioral Health | Admitting: Behavioral Health

## 2020-01-02 DIAGNOSIS — K589 Irritable bowel syndrome without diarrhea: Secondary | ICD-10-CM | POA: Diagnosis present

## 2020-01-02 DIAGNOSIS — F3112 Bipolar disorder, current episode manic without psychotic features, moderate: Secondary | ICD-10-CM | POA: Diagnosis present

## 2020-01-02 DIAGNOSIS — H9190 Unspecified hearing loss, unspecified ear: Secondary | ICD-10-CM | POA: Diagnosis present

## 2020-01-02 DIAGNOSIS — Z818 Family history of other mental and behavioral disorders: Secondary | ICD-10-CM | POA: Diagnosis not present

## 2020-01-02 DIAGNOSIS — Z880 Allergy status to penicillin: Secondary | ICD-10-CM | POA: Diagnosis not present

## 2020-01-02 DIAGNOSIS — R4587 Impulsiveness: Secondary | ICD-10-CM | POA: Diagnosis present

## 2020-01-02 DIAGNOSIS — Z20822 Contact with and (suspected) exposure to covid-19: Secondary | ICD-10-CM | POA: Diagnosis present

## 2020-01-02 DIAGNOSIS — G4730 Sleep apnea, unspecified: Secondary | ICD-10-CM | POA: Diagnosis present

## 2020-01-02 DIAGNOSIS — N390 Urinary tract infection, site not specified: Secondary | ICD-10-CM

## 2020-01-02 DIAGNOSIS — R309 Painful micturition, unspecified: Secondary | ICD-10-CM | POA: Diagnosis present

## 2020-01-02 DIAGNOSIS — R519 Headache, unspecified: Secondary | ICD-10-CM | POA: Diagnosis not present

## 2020-01-02 DIAGNOSIS — E039 Hypothyroidism, unspecified: Secondary | ICD-10-CM | POA: Diagnosis present

## 2020-01-02 DIAGNOSIS — F419 Anxiety disorder, unspecified: Secondary | ICD-10-CM | POA: Insufficient documentation

## 2020-01-02 DIAGNOSIS — Z9842 Cataract extraction status, left eye: Secondary | ICD-10-CM

## 2020-01-02 DIAGNOSIS — F411 Generalized anxiety disorder: Secondary | ICD-10-CM | POA: Diagnosis present

## 2020-01-02 DIAGNOSIS — F309 Manic episode, unspecified: Secondary | ICD-10-CM | POA: Insufficient documentation

## 2020-01-02 DIAGNOSIS — I2510 Atherosclerotic heart disease of native coronary artery without angina pectoris: Secondary | ICD-10-CM | POA: Diagnosis present

## 2020-01-02 DIAGNOSIS — E785 Hyperlipidemia, unspecified: Secondary | ICD-10-CM | POA: Diagnosis present

## 2020-01-02 DIAGNOSIS — Z8719 Personal history of other diseases of the digestive system: Secondary | ICD-10-CM

## 2020-01-02 DIAGNOSIS — Z961 Presence of intraocular lens: Secondary | ICD-10-CM | POA: Diagnosis present

## 2020-01-02 DIAGNOSIS — F32A Depression, unspecified: Secondary | ICD-10-CM

## 2020-01-02 DIAGNOSIS — I25119 Atherosclerotic heart disease of native coronary artery with unspecified angina pectoris: Secondary | ICD-10-CM | POA: Diagnosis not present

## 2020-01-02 DIAGNOSIS — K219 Gastro-esophageal reflux disease without esophagitis: Secondary | ICD-10-CM | POA: Diagnosis present

## 2020-01-02 DIAGNOSIS — M797 Fibromyalgia: Secondary | ICD-10-CM | POA: Diagnosis present

## 2020-01-02 DIAGNOSIS — M3500 Sicca syndrome, unspecified: Secondary | ICD-10-CM | POA: Diagnosis present

## 2020-01-02 DIAGNOSIS — Z9841 Cataract extraction status, right eye: Secondary | ICD-10-CM | POA: Diagnosis not present

## 2020-01-02 DIAGNOSIS — R441 Visual hallucinations: Secondary | ICD-10-CM | POA: Diagnosis present

## 2020-01-02 DIAGNOSIS — G4700 Insomnia, unspecified: Secondary | ICD-10-CM | POA: Diagnosis present

## 2020-01-02 DIAGNOSIS — M069 Rheumatoid arthritis, unspecified: Secondary | ICD-10-CM | POA: Diagnosis present

## 2020-01-02 DIAGNOSIS — Z885 Allergy status to narcotic agent status: Secondary | ICD-10-CM | POA: Diagnosis not present

## 2020-01-02 DIAGNOSIS — Z882 Allergy status to sulfonamides status: Secondary | ICD-10-CM

## 2020-01-02 DIAGNOSIS — Z881 Allergy status to other antibiotic agents status: Secondary | ICD-10-CM

## 2020-01-02 DIAGNOSIS — F3010 Manic episode without psychotic symptoms, unspecified: Secondary | ICD-10-CM

## 2020-01-02 DIAGNOSIS — Z888 Allergy status to other drugs, medicaments and biological substances status: Secondary | ICD-10-CM

## 2020-01-02 LAB — URINE DRUG SCREEN, QUALITATIVE (ARMC ONLY)
Amphetamines, Ur Screen: NOT DETECTED
Barbiturates, Ur Screen: NOT DETECTED
Benzodiazepine, Ur Scrn: NOT DETECTED
Cannabinoid 50 Ng, Ur ~~LOC~~: NOT DETECTED
Cocaine Metabolite,Ur ~~LOC~~: NOT DETECTED
MDMA (Ecstasy)Ur Screen: NOT DETECTED
Methadone Scn, Ur: NOT DETECTED
Opiate, Ur Screen: NOT DETECTED
Phencyclidine (PCP) Ur S: NOT DETECTED
Tricyclic, Ur Screen: NOT DETECTED

## 2020-01-02 LAB — URINALYSIS, COMPLETE (UACMP) WITH MICROSCOPIC
Bacteria, UA: NONE SEEN
Bilirubin Urine: NEGATIVE
Glucose, UA: NEGATIVE mg/dL
Ketones, ur: NEGATIVE mg/dL
Nitrite: NEGATIVE
Protein, ur: NEGATIVE mg/dL
Specific Gravity, Urine: 1.011 (ref 1.005–1.030)
WBC, UA: >50 WBC/hpf — ABNORMAL HIGH (ref 0–5)
pH: 7 (ref 5.0–8.0)

## 2020-01-02 LAB — RESPIRATORY PANEL BY RT PCR (FLU A&B, COVID)
Influenza A by PCR: NEGATIVE
Influenza B by PCR: NEGATIVE
SARS Coronavirus 2 by RT PCR: NEGATIVE

## 2020-01-02 LAB — COMPREHENSIVE METABOLIC PANEL
ALT: 20 U/L (ref 0–44)
AST: 32 U/L (ref 15–41)
Albumin: 4 g/dL (ref 3.5–5.0)
Alkaline Phosphatase: 54 U/L (ref 38–126)
Anion gap: 10 (ref 5–15)
BUN: 20 mg/dL (ref 8–23)
CO2: 27 mmol/L (ref 22–32)
Calcium: 9.4 mg/dL (ref 8.9–10.3)
Chloride: 102 mmol/L (ref 98–111)
Creatinine, Ser: 0.41 mg/dL — ABNORMAL LOW (ref 0.44–1.00)
GFR, Estimated: >60 mL/min (ref >60)
Glucose, Bld: 106 mg/dL — ABNORMAL HIGH (ref 70–99)
Potassium: 4.2 mmol/L (ref 3.5–5.1)
Sodium: 139 mmol/L (ref 135–145)
Total Bilirubin: 0.6 mg/dL (ref 0.3–1.2)
Total Protein: 6.9 g/dL (ref 6.5–8.1)

## 2020-01-02 LAB — CBC
HCT: 42.7 % (ref 36.0–46.0)
Hemoglobin: 14.3 g/dL (ref 12.0–15.0)
MCH: 35 pg — ABNORMAL HIGH (ref 26.0–34.0)
MCHC: 33.5 g/dL (ref 30.0–36.0)
MCV: 104.4 fL — ABNORMAL HIGH (ref 80.0–100.0)
Platelets: 155 10*3/uL (ref 150–400)
RBC: 4.09 MIL/uL (ref 3.87–5.11)
RDW: 13.1 % (ref 11.5–15.5)
WBC: 8 10*3/uL (ref 4.0–10.5)
nRBC: 0 % (ref 0.0–0.2)

## 2020-01-02 LAB — LIPID PANEL
Cholesterol: 222 mg/dL — ABNORMAL HIGH (ref 0–200)
HDL: 69 mg/dL (ref >40)
LDL Cholesterol: 115 mg/dL — ABNORMAL HIGH (ref 0–99)
Total CHOL/HDL Ratio: 3.2 RATIO
Triglycerides: 190 mg/dL — ABNORMAL HIGH (ref <150)
VLDL: 38 mg/dL (ref 0–40)

## 2020-01-02 LAB — CARBAMAZEPINE LEVEL, TOTAL: Carbamazepine Lvl: 8.8 ug/mL (ref 4.0–12.0)

## 2020-01-02 LAB — GLUCOSE, CAPILLARY: Glucose-Capillary: 85 mg/dL (ref 70–99)

## 2020-01-02 LAB — ETHANOL: Alcohol, Ethyl (B): <10 mg/dL (ref <10)

## 2020-01-02 LAB — VALPROIC ACID LEVEL: Valproic Acid Lvl: 60 ug/mL (ref 50.0–100.0)

## 2020-01-02 LAB — TSH: TSH: 2.678 u[IU]/mL (ref 0.350–4.500)

## 2020-01-02 MED ORDER — CARBAMAZEPINE ER 200 MG PO TB12
400.0000 mg | ORAL_TABLET | Freq: Two times a day (BID) | ORAL | Status: DC
  Administered 2020-01-02 – 2020-01-11 (×18): 400 mg via ORAL
  Filled 2020-01-02 (×21): qty 2

## 2020-01-02 MED ORDER — MIRABEGRON ER 25 MG PO TB24
25.0000 mg | ORAL_TABLET | Freq: Every day | ORAL | Status: DC
  Administered 2020-01-02 – 2020-01-05 (×4): 25 mg via ORAL
  Filled 2020-01-02 (×5): qty 1

## 2020-01-02 MED ORDER — LEVOTHYROXINE SODIUM 50 MCG PO TABS
75.0000 ug | ORAL_TABLET | ORAL | Status: DC
  Administered 2020-01-02: 75 ug via ORAL
  Filled 2020-01-02: qty 2

## 2020-01-02 MED ORDER — FOLIC ACID 1 MG PO TABS
2.0000 mg | ORAL_TABLET | Freq: Every day | ORAL | Status: DC
  Administered 2020-01-03 – 2020-01-11 (×9): 2 mg via ORAL
  Filled 2020-01-02 (×9): qty 2

## 2020-01-02 MED ORDER — ADULT MULTIVITAMIN W/MINERALS CH
1.0000 | ORAL_TABLET | Freq: Every day | ORAL | Status: DC
  Administered 2020-01-03 – 2020-01-11 (×9): 1 via ORAL
  Filled 2020-01-02 (×10): qty 1

## 2020-01-02 MED ORDER — GABAPENTIN 300 MG PO CAPS
300.0000 mg | ORAL_CAPSULE | Freq: Two times a day (BID) | ORAL | Status: DC
  Administered 2020-01-02 – 2020-01-11 (×18): 300 mg via ORAL
  Filled 2020-01-02 (×8): qty 1
  Filled 2020-01-02: qty 3
  Filled 2020-01-02 (×9): qty 1

## 2020-01-02 MED ORDER — LEVOTHYROXINE SODIUM 50 MCG PO TABS: 150.0000 ug | ORAL_TABLET | Freq: Every day | ORAL | Status: DC

## 2020-01-02 MED ORDER — PRAVASTATIN SODIUM 20 MG PO TABS
20.0000 mg | ORAL_TABLET | Freq: Every day | ORAL | Status: DC
  Administered 2020-01-03 – 2020-01-10 (×8): 20 mg via ORAL
  Filled 2020-01-02 (×10): qty 1

## 2020-01-02 MED ORDER — CALCIUM CITRATE-VITAMIN D 200-250 MG-UNIT PO TABS
1.0000 | ORAL_TABLET | Freq: Every day | ORAL | Status: DC
  Filled 2020-01-02: qty 1

## 2020-01-02 MED ORDER — DIVALPROEX SODIUM ER 500 MG PO TB24
500.0000 mg | ORAL_TABLET | Freq: Two times a day (BID) | ORAL | Status: DC
  Administered 2020-01-02 – 2020-01-11 (×18): 500 mg via ORAL
  Filled 2020-01-02 (×18): qty 1

## 2020-01-02 MED ORDER — CALCIUM CITRATE-VITAMIN D 500-500 MG-UNIT PO CHEW
0.5000 | CHEWABLE_TABLET | Freq: Every day | ORAL | Status: DC
  Filled 2020-01-02 (×3): qty 0.5

## 2020-01-02 MED ORDER — MAGNESIUM HYDROXIDE 400 MG/5ML PO SUSP: 30.0000 mL | Freq: Every day | ORAL | Status: DC | PRN

## 2020-01-02 MED ORDER — ACETAMINOPHEN 325 MG PO TABS
650.0000 mg | ORAL_TABLET | Freq: Four times a day (QID) | ORAL | Status: DC | PRN
  Administered 2020-01-02 – 2020-01-04 (×6): 650 mg via ORAL
  Filled 2020-01-02 (×6): qty 2

## 2020-01-02 MED ORDER — ACETAMINOPHEN 500 MG PO TABS
1000.0000 mg | ORAL_TABLET | Freq: Four times a day (QID) | ORAL | Status: DC | PRN
  Administered 2020-01-02: 1000 mg via ORAL
  Filled 2020-01-02: qty 2

## 2020-01-02 MED ORDER — PRAVASTATIN SODIUM 40 MG PO TABS
40.0000 mg | ORAL_TABLET | Freq: Every day | ORAL | Status: DC
  Administered 2020-01-02: 40 mg via ORAL
  Filled 2020-01-02: qty 1

## 2020-01-02 MED ORDER — HYDRALAZINE HCL 25 MG PO TABS
25.0000 mg | ORAL_TABLET | Freq: Four times a day (QID) | ORAL | Status: DC | PRN
  Administered 2020-01-03: 25 mg via ORAL
  Filled 2020-01-02 (×2): qty 1

## 2020-01-02 MED ORDER — ZOLPIDEM TARTRATE 5 MG PO TABS: 5.0000 mg | ORAL_TABLET | Freq: Every day | ORAL | Status: DC

## 2020-01-02 MED ORDER — METHOTREXATE 2.5 MG PO TABS
15.0000 mg | ORAL_TABLET | ORAL | Status: DC
  Administered 2020-01-03 – 2020-01-10 (×2): 15 mg via ORAL
  Filled 2020-01-02 (×2): qty 6

## 2020-01-02 MED ORDER — FLUTICASONE PROPIONATE 50 MCG/ACT NA SUSP
2.0000 | Freq: Every day | NASAL | Status: DC
  Administered 2020-01-03 – 2020-01-07 (×4): 2 via NASAL
  Filled 2020-01-02: qty 16

## 2020-01-02 MED ORDER — ALUM & MAG HYDROXIDE-SIMETH 200-200-20 MG/5ML PO SUSP: 30.0000 mL | ORAL | Status: DC | PRN

## 2020-01-02 MED ORDER — LEVOTHYROXINE SODIUM 50 MCG PO TABS
75.0000 ug | ORAL_TABLET | Freq: Every day | ORAL | Status: DC
  Administered 2020-01-03 – 2020-01-11 (×9): 75 ug via ORAL
  Filled 2020-01-02 (×9): qty 2

## 2020-01-02 MED ORDER — ASPIRIN 81 MG PO CHEW
81.0000 mg | CHEWABLE_TABLET | Freq: Every day | ORAL | Status: DC
  Administered 2020-01-03 – 2020-01-11 (×9): 81 mg via ORAL
  Filled 2020-01-02 (×9): qty 1

## 2020-01-02 MED ORDER — ASPIRIN 81 MG PO CHEW: 81.0000 mg | CHEWABLE_TABLET | Freq: Every day | ORAL | Status: DC

## 2020-01-02 MED ORDER — FOSFOMYCIN TROMETHAMINE 3 G PO PACK
3.0000 g | PACK | Freq: Once | ORAL | Status: AC
  Administered 2020-01-02: 3 g via ORAL
  Filled 2020-01-02: qty 3

## 2020-01-02 NOTE — ED Notes (Addendum)
Pt transferring to [facility] 903. Report given to [facility], Therapist, sports. Pt informed of admission and she verbalized understanding. Transported via w/c with her personal belongings and cane.

## 2020-01-02 NOTE — Consult Note (Signed)
[facility] Consult   Reason for Consult: Consult for this 74 year old woman with bipolar disorder who presents to the hospital with symptoms of mania Referring Physician: Bland Span Patient Identification: Stephanie [name] MRN:  [id number] Principal Diagnosis: Bipolar 1 disorder with moderate mania ([place]) Diagnosis:  Principal Problem:   Bipolar 1 disorder with moderate mania ([address]) Active Problems:   Fibromyalgia   Arthritis or polyarthritis, rheumatoid ([place])   Total Time spent with patient: 1 hour  Subjective:   Stephanie [name] is a 74 y.o. female patient admitted with "I need something to help me sleep".  HPI: Patient seen chart reviewed.  74 year old woman with a diagnosis of bipolar disorder was recommended to come to the emergency room by her primary psychiatrist.  Patient reports that she has been having manic symptoms that have been progressing recently.  She notes that she has been sleeping very poorly for the last 4 days although she admits that some of the symptoms including poor concentration and thinking and talking too quickly may have been going on for as much as a few weeks.  Patient is having trouble focusing.  Sleep is poor.  She says she has had very passive suicidal thoughts with no plan of acting on it at all.  No homicidal thoughts.  Denies any specific psychotic symptoms.  He reports that she is compliant with her recommended outpatient medication and has not had any significant changes to her medicine recently.  Does not drink does not use any drugs.  Patient had good insight into her condition and was appropriate and cooperative.  She is expecting hospital level treatment  Past Psychiatric History: Longstanding diagnosis of bipolar disorder.  It looks like separate diagnoses of anxiety disorders have been entertained as well.  He has had previous hospitalizations but reports that the last time was many years ago.  Denies any past suicide attempts.  He is  currently on a combination of Tegretol and Depakote as her primary medications.  She claims that she has been tried on other medicines but has developed a fear that she would have tardive dyskinesia.  In fact she claims that somebody told her she already has tardive dyskinesia which is got her even more nervous.  Risk to Self:   Risk to Others:   Prior Inpatient Therapy:   Prior Outpatient Therapy:    Past Medical History:  Past Medical History:  Diagnosis Date  . Anginal pain ([place])   . Anxiety   . Bipolar 1 disorder ([place])   . Bipolar disorder ([place])   . Coronary artery disease   . Depression   . Fatigue   . Fibromyalgia   . GERD (gastroesophageal reflux disease)   . Heart disease   . Heart failure ([place])   . HLD (hyperlipidemia)   . HOH (hard of hearing)   . Hypothyroidism   . IBS (irritable bowel syndrome)   . Pelvic pain in female   . Perianal lesion    high grade sil lesion- keratinizing type  . RA (rheumatoid arthritis) ([place])   . Raynaud phenomenon   . Sjogren's disease ([place])   . Sleep apnea   . Vaginal Pap smear, abnormal     Past Surgical History:  Procedure Laterality Date  . CATARACT EXTRACTION W/PHACO Left 02/23/2017   Procedure: CATARACT EXTRACTION PHACO AND INTRAOCULAR LENS PLACEMENT (IOC);  Surgeon: Birder Robson, MD;  Location: [facility] ORS;  Service: Ophthalmology;  Laterality: Left;  [place] 00:35 AP% 9.6 CDE 3.47 Fluid pack lot #  [id number]  . CATARACT EXTRACTION W/PHACO Right 03/30/2017   Procedure: CATARACT EXTRACTION PHACO AND INTRAOCULAR LENS PLACEMENT (IOC);  Surgeon: Birder Robson, MD;  Location: [facility] ORS;  Service: Ophthalmology;  Laterality: Right;  [place] 00:32.6 AP% 13.8 CDE 4.51 Fluid Pack Lot # [id number]  . EYE SURGERY    . TONSILLECTOMY     Family History:  Family History  Problem Relation Age of Onset  . Heart failure Father   . Parkinson's disease Mother   . Bipolar disorder Mother   . Depression Sister   . Schizophrenia Paternal  Grandmother   . Diabetes Maternal Grandmother   . Pancreatic cancer Maternal Grandmother   . Cancer Neg Hx   . Heart disease Neg Hx   . Breast cancer Neg Hx    Family Psychiatric  History: She says that her mother had mood swings.  More distant relatives may have had mental health issues as well. Social History:  Social History   Substance and Sexual Activity  Alcohol Use No  . Alcohol/week: 0.0 standard drinks     Social History   Substance and Sexual Activity  Drug Use No    Social History   Socioeconomic History  . Marital status: Divorced    Spouse name: Not on file  . Number of children: 2  . Years of education: Not on file  . Highest education level: Master's degree (e.g., MA, MS, MEng, MEd, MSW, MBA)  Occupational History    Comment: retired2  Tobacco Use  . Smoking status: Never Smoker  . Smokeless tobacco: Former Network engineer  . Vaping Use: Never used  Substance and Sexual Activity  . Alcohol use: No    Alcohol/week: 0.0 standard drinks  . Drug use: No  . Sexual activity: Never    Birth control/protection: Post-menopausal  Other Topics Concern  . Not on file  Social History Narrative  . Not on file   Social Determinants of Health   Financial Resource Strain:   . Difficulty of Paying Living Expenses: Not on file  Food Insecurity:   . Worried About Charity fundraiser in the Last Year: Not on file  . Ran Out of Food in the Last Year: Not on file  Transportation Needs:   . Lack of Transportation (Medical): Not on file  . Lack of Transportation (Non-Medical): Not on file  Physical Activity:   . Days of Exercise per Week: Not on file  . Minutes of Exercise per Session: Not on file  Stress:   . Feeling of Stress : Not on file  Social Connections:   . Frequency of Communication with Friends and Family: Not on file  . Frequency of Social Gatherings with Friends and Family: Not on file  . Attends Religious Services: Not on file  . Active Member of  Clubs or Organizations: Not on file  . Attends Archivist Meetings: Not on file  . Marital Status: Not on file   Additional Social History:    Allergies:   Allergies  Allergen Reactions  . Amoxicillin-Pot Clavulanate Nausea Only and Other (See Comments)    Diarrhea Has patient had a PCN reaction causing immediate rash, facial/tongue/throat swelling, SOB or lightheadedness with hypotension:  Has patient had a PCN reaction causing severe rash involving mucus membranes or skin necrosis: Has patient had a PCN reaction that required hospitalization:  Has patient had a PCN reaction occurring within the last 10 years: If all of the above answers are "NO", then may  proceed with Cephalosporin use.   Marland Kitchen Oxycodone-Acetaminophen Nausea Only  . Erythromycin Nausea Only  . Azithromycin Nausea And Vomiting  . Codeine   . Macrolides And Ketolides   . Oxycodone-Acetaminophen   . Penicillins Other (See Comments)    Has patient had a PCN reaction causing immediate rash, facial/tongue/throat swelling, SOB or lightheadedness with hypotension: NO Has patient had a PCN reaction causing severe rash involving mucus membranes or skin necrosis: NO Has patient had a PCN reaction that required hospitalization: NO Has patient had a PCN reaction occurring within the last 10 years: NO If all of the above answers are "NO", then may proceed with Cephalosporin use.   . Remeron [Mirtazapine]     Side effect-likely tardive dyskinesia  . Tramadol Nausea And Vomiting  . Zolpidem     Sleep walking  . Lamotrigine Rash    LAMICTAL  . Sulfa Antibiotics Rash    Labs:  Results for orders placed or performed during the hospital encounter of 01/02/20 (from the past 48 hour(s))  Comprehensive metabolic panel     Status: Abnormal   Collection Time: 01/02/20  9:30 AM  Result Value Ref Range   Sodium 139 135 - 145 mmol/L   Potassium 4.2 3.5 - 5.1 mmol/L   Chloride 102 98 - 111 mmol/L   CO2 27 22 - 32 mmol/L    Glucose, Bld 106 (H) 70 - 99 mg/dL    Comment: Glucose reference range applies only to samples taken after fasting for at least 8 hours.   BUN 20 8 - 23 mg/dL   Creatinine, Ser 0.41 (L) 0.44 - 1.00 mg/dL   Calcium 9.4 8.9 - 10.3 mg/dL   Total Protein 6.9 6.5 - 8.1 g/dL   Albumin 4.0 3.5 - 5.0 g/dL   AST 32 15 - 41 U/L   ALT 20 0 - 44 U/L   Alkaline Phosphatase 54 38 - 126 U/L   Total Bilirubin 0.6 0.3 - 1.2 mg/dL   GFR, Estimated >60 >60 mL/min    Comment: (NOTE) Calculated using the CKD-EPI Creatinine Equation (2021)    Anion gap 10 5 - 15    Comment: Performed at [facility], [address] [place], [place] [address]  Ethanol     Status: None   Collection Time: 01/02/20  9:30 AM  Result Value Ref Range   Alcohol, Ethyl (B) <10 <10 mg/dL    Comment: (NOTE) Lowest detectable limit for serum alcohol is 10 mg/dL.  For medical purposes only. Performed at [facility], [address] [place], [place] [address]   cbc     Status: Abnormal   Collection Time: 01/02/20  9:30 AM  Result Value Ref Range   WBC 8.0 4.0 - 10.5 K/uL   RBC 4.09 3.87 - 5.11 MIL/uL   Hemoglobin 14.3 12.0 - 15.0 g/dL   HCT 42.7 36 - 46 %   MCV 104.4 (H) 80.0 - 100.0 fL   MCH 35.0 (H) 26.0 - 34.0 pg   MCHC 33.5 30.0 - 36.0 g/dL   RDW 13.1 11.5 - 15.5 %   Platelets 155 150 - 400 K/uL   nRBC 0.0 0.0 - 0.2 %    Comment: Performed at [facility], [address] [place], [place] [address]  Urine Drug Screen, Qualitative     Status: None   Collection Time: 01/02/20  9:30 AM  Result Value Ref Range   Tricyclic, Ur Screen NONE DETECTED NONE DETECTED   Amphetamines, Ur  Screen NONE DETECTED NONE DETECTED   MDMA (Ecstasy)Ur Screen NONE DETECTED NONE DETECTED   Cocaine Metabolite,Ur [place] NONE DETECTED NONE DETECTED   Opiate, Ur Screen NONE DETECTED NONE DETECTED   Phencyclidine (PCP) Ur S NONE DETECTED NONE DETECTED   Cannabinoid 50 Ng, Ur [place] NONE DETECTED NONE DETECTED    Barbiturates, Ur Screen NONE DETECTED NONE DETECTED   Benzodiazepine, Ur Scrn NONE DETECTED NONE DETECTED   Methadone Scn, Ur NONE DETECTED NONE DETECTED    Comment: (NOTE) Tricyclics + metabolites, urine    Cutoff 1000 ng/mL Amphetamines + metabolites, urine  Cutoff 1000 ng/mL MDMA (Ecstasy), urine              Cutoff 500 ng/mL Cocaine Metabolite, urine          Cutoff 300 ng/mL Opiate + metabolites, urine        Cutoff 300 ng/mL Phencyclidine (PCP), urine         Cutoff 25 ng/mL Cannabinoid, urine                 Cutoff 50 ng/mL Barbiturates + metabolites, urine  Cutoff 200 ng/mL Benzodiazepine, urine              Cutoff 200 ng/mL Methadone, urine                   Cutoff 300 ng/mL  The urine drug screen provides only a preliminary, unconfirmed analytical test result and should not be used for non-medical purposes. Clinical consideration and professional judgment should be applied to any positive drug screen result due to possible interfering substances. A more specific alternate chemical method must be used in order to obtain a confirmed analytical result. Gas chromatography / mass spectrometry (GC/MS) is the preferred confirm atory method. Performed at [facility], [address] [place], [place] [address]   TSH     Status: None   Collection Time: 01/02/20  9:30 AM  Result Value Ref Range   TSH 2.678 0.350 - 4.500 uIU/mL    Comment: Performed by a 3rd Generation assay with a functional sensitivity of <=0.01 uIU/mL. Performed at [facility], [address] [place], [place] [address]     No current facility-administered medications for this encounter.   Current Outpatient Medications  Medication Sig Dispense Refill  . Abatacept (ORENCIA IV) Inject 1 Dose into the vein every 30 (thirty) days.    . Acetaminophen 500 MG capsule Take by mouth.    Marland Kitchen aspirin 81 MG chewable tablet Chew 81 mg by mouth daily with breakfast.    . Calcium Citrate-Vitamin D  ([employer] CALCIUM CITRATE +D3 MINI) 200-250 MG-UNIT TABS Take 1 tablet by mouth daily after breakfast.     . carbamazepine (TEGRETOL XR) 200 MG 12 hr tablet Take 2 tablets (400 mg total) by mouth 2 (two) times daily. 360 tablet 1  . cariprazine (VRAYLAR) capsule Take 1 capsule (1.5 mg total) by mouth daily. At bedtime 30 capsule 1  . divalproex (DEPAKOTE ER) 500 MG 24 hr tablet Take 1 tablet twice a day 180 tablet 1  . eszopiclone (LUNESTA) 1 MG TABS tablet Take 1-2 tablets (1-2 mg total) by mouth at bedtime as needed for sleep. Take immediately before bedtime 15 tablet 1  . fluticasone (FLONASE) 50 MCG/ACT nasal spray     . folic acid (FOLVITE) 1 MG tablet Take 2 mg by mouth daily with breakfast.     . gabapentin (NEURONTIN) 300 MG capsule Take 1 tablet twice  a day 180 capsule 1  . ibandronate (BONIVA) 150 MG tablet     . levothyroxine (SYNTHROID, LEVOTHROID) 75 MCG tablet TAKE 1 TABLET BY MOUTH EACH DAY ON EMPTYSTOMACH WITH A GLASS OF WATERAT LEAST 30 TO 60 MINUTES BEFORE BREAKFAST IN THE MORNING    . methotrexate 2.5 MG tablet Take 15 mg by mouth every Wednesday. WEDNESDAYS AT LUNCH    . Multiple Vitamins-Minerals (MULTIVITAMIN WITH MINERALS) tablet Take 1 tablet by mouth daily with breakfast.     . MYRBETRIQ 25 MG TB24 tablet     . pravastatin (PRAVACHOL) 40 MG tablet       Musculoskeletal: Strength & Muscle Tone: within normal limits Gait & Station: normal Patient leans: N/A  Psychiatric Specialty Exam: Physical Exam Vitals and nursing note reviewed.  Constitutional:      Appearance: She is well-developed.  HENT:     Head: Normocephalic and atraumatic.  Eyes:     Conjunctiva/sclera: Conjunctivae normal.     Pupils: Pupils are equal, round, and reactive to light.  Cardiovascular:     Heart sounds: Normal heart sounds.  Pulmonary:     Effort: Pulmonary effort is normal.  Abdominal:     Palpations: Abdomen is soft.  Musculoskeletal:        General: Normal range of motion.      Cervical back: Normal range of motion.  Skin:    General: Skin is warm and dry.  Neurological:     General: No focal deficit present.     Mental Status: She is alert.  Psychiatric:        Attention and Perception: Attention normal.        Mood and Affect: Mood is elated. Affect is labile.        Speech: Speech is rapid and pressured.        Behavior: Behavior is agitated. Behavior is not aggressive or combative.        Thought Content: Thought content is not paranoid. Thought content does not include homicidal or suicidal ideation.        Cognition and Memory: Cognition normal.        Judgment: Judgment normal.     Review of Systems  Constitutional: Negative.   HENT: Negative.   Eyes: Negative.   Respiratory: Negative.   Cardiovascular: Negative.   Gastrointestinal: Negative.   Musculoskeletal: Negative.   Skin: Negative.   Neurological: Negative.   Psychiatric/Behavioral: Positive for confusion, decreased concentration and sleep disturbance. Negative for suicidal ideas. The patient is nervous/anxious and is hyperactive.     Blood pressure (!) 144/88, pulse 95, temperature 98 F (36.7 C), resp. rate 18, height 5' (1.524 m), weight 49.9 kg, SpO2 95 %.Body mass index is 21.48 kg/m.  General Appearance: Casual  Eye Contact:  Good  Speech:  Clear and Coherent  Volume:  Increased  Mood:  Euphoric  Affect:  Congruent  Thought Process:  Disorganized  Orientation:  Full (Time, Place, and Person)  Thought Content:  Rumination and Tangential  Suicidal Thoughts:  Yes.  without intent/plan  Homicidal Thoughts:  No  Memory:  Immediate;   Fair Recent;   Fair Remote;   Fair  Judgement:  Fair  Insight:  Fair  Psychomotor Activity:  Increased  Concentration:  Concentration: Fair  Recall:  [employer] of Knowledge:  Fair  Language:  Fair  Akathisia:  No  Handed:  Right  AIMS (if indicated):     Assets:  Desire for [place]  ADL's:  Intact   Cognition:  WNL  Sleep:        Treatment Plan Summary: Daily contact with patient to assess and evaluate symptoms and progress in treatment, Medication management and Plan 74 year old woman with bipolar disorder.  Patient also has multiple medical problems most notably rheumatoid arthritis and Sjogren's syndrome.  She has chronic treatment for both of these at home as well.  Patient is currently expressing symptoms of mania with some passive suicidal thoughts.  Recommend admission to the hospital with attempts to stabilize her medication.  Patient is agreeable and voluntary to all of this.  Labs reviewed.  Orders will be placed for medications including any labs that are still required.  Disposition: Recommend psychiatric Inpatient admission when medically cleared.  Alethia Berthold, MD 01/02/2020 11:46 AM

## 2020-01-02 NOTE — ED Notes (Signed)
Pt dressed out by this RN and melanie ed tech. Belonging placed in bag with pt label. Belonging include, stripped shrit, white socks, black pants, flowered bag, white hat, black nike shoes, maroon sweater, navy jacket, silver earrings, gold color watch.

## 2020-01-02 NOTE — ED Triage Notes (Signed)
Pt arrives via pov c/o acute manic episode. Pt reports hx. States unable to sleep x4 days. Pressures speech in triage, and spelling out names of people when talking about them with over exaggerated pronunciation. Denies SI/HI.

## 2020-01-02 NOTE — Progress Notes (Signed)
74 year old woman with a diagnosis of bipolar disorder was recommended to come to the emergency room by her primary psychiatrist.  Patient reports that she has been having manic symptoms that have been progressing recently.  She notes that she has been sleeping very poorly for the last 4 days although she admits that some of the symptoms including poor concentration and thinking and talking too quickly may have been going on for as much as a few weeks.  Patient is having trouble focusing.  Sleep is poor.  She says she has had very passive suicidal thoughts with no plan of acting on it at all however denies SI/HI/AVH upon assessment on unit. She reports she is compliant with her recommended outpatient medication and has not had any significant changes to her medicine recently.  Does not drink does not use any drugs.  Patient had good insight into her condition and was appropriate and cooperative.  She is expecting hospital level treatment  Pt is calm and cooperative during assessment. Skin assessment preformed upon admission. Pt has scattered bruising r/t taking aspirin daily.

## 2020-01-02 NOTE — ED Notes (Signed)
Pt currently talking to Dr Weber Cooks

## 2020-01-02 NOTE — ED Notes (Signed)
Snack was given to patient at this time

## 2020-01-02 NOTE — ED Provider Notes (Signed)
[facility] Emergency Department Provider Note  ____________________________________________   First MD Initiated Contact with Patient 01/02/20 1025     (approximate)  I have reviewed the triage vital signs and the nursing notes.   HISTORY  Chief Complaint Psychiatric Evaluation   HPI Stephanie [name] is a 74 y.o. female with a past medical history of HDL, GERD, fibromyalgia, IBS, Sjogren's disease, OSA, CAD, depression, and bipolar disorder who presents for assessment stating she has been able to sleep for over 4 days.  Patient also states she has been feeling more anxious and depressed lately and has some burning with urination last 3 days as well.  She states she occasionally has some suicidal thoughts but no plan and states she would never act on these thoughts.  She denies any HI or hallucinations.  Denies any headache, earache, sore throat, fevers, chills, cough, nausea, vomiting, diarrhea, back pain abdominal pain, rash, extremity pain, other complaints.  No recent falls or injuries.  Denies EtOH or illicit drug use.         Past Medical History:  Diagnosis Date  . Anginal pain ([place])   . Anxiety   . Bipolar 1 disorder ([place])   . Bipolar disorder ([place])   . Coronary artery disease   . Depression   . Fatigue   . Fibromyalgia   . GERD (gastroesophageal reflux disease)   . Heart disease   . Heart failure ([place])   . HLD (hyperlipidemia)   . HOH (hard of hearing)   . Hypothyroidism   . IBS (irritable bowel syndrome)   . Pelvic pain in female   . Perianal lesion    high grade sil lesion- keratinizing type  . RA (rheumatoid arthritis) ([place])   . Raynaud phenomenon   . Sjogren's disease ([place])   . Sleep apnea   . Vaginal Pap smear, abnormal     Patient Active Problem List   Diagnosis Date Noted  . Personal history of affective disorder 09/06/2019  . Bipolar I disorder, most recent episode (or current) manic ([place]) 11/02/2018  . Insomnia due to  mental disorder 11/02/2018  . 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/20/2017  . Chest pain 05/17/2015  . OP (osteoporosis) 02/25/2015  . Sjogren's syndrome ([place]) 02/20/2015  . Combined fat and carbohydrate induced hyperlipemia 10/03/2014  . Breathlessness on exertion 09/24/2014  . Bipolar 1 disorder with moderate mania ([place]) 08/29/2014  . Bipolar disorder, in full remission, most recent episode manic ([place]) 08/29/2014  . H/O gastrointestinal disease 08/29/2014  . GAD (generalized anxiety disorder) 08/29/2014  . Fibromyalgia 08/29/2014  . H/O elevated lipids 08/29/2014  . H/O: hypothyroidism 08/29/2014  . Insomnia, persistent 08/29/2014  . 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 Chronic pain associated with significant psychosocial dysfunction 08/29/2014  . 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 Rheumatoid arthritis ([place]) 08/29/2014  . Benign essential HTN 01/10/2014  . 3-vessel CAD 01/10/2014  . Polypharmacy 06/29/2013  . High risk medication use 06/29/2013  . Arthritis or polyarthritis, rheumatoid ([place]) 06/22/2013  . Acid reflux 05/05/2012  . Adult hypothyroidism 01/07/2012  . Flu vaccine need 01/07/2012  . Cough 12/14/2011  . CD (contact dermatitis) 10/15/2011  . Finger wound, simple, open 10/15/2011    Past Surgical History:  Procedure Laterality Date  . CATARACT EXTRACTION W/PHACO Left 02/23/2017   Procedure: CATARACT EXTRACTION PHACO AND INTRAOCULAR LENS PLACEMENT (IOC);  Surgeon: Birder Robson, MD;  Location: [facility] ORS;  Service: Ophthalmology;  Laterality: Left;  [place]  00:35 AP% 9.6 CDE 3.47 Fluid pack lot # [id number]  . CATARACT EXTRACTION W/PHACO Right 03/30/2017   Procedure: CATARACT EXTRACTION PHACO AND INTRAOCULAR LENS PLACEMENT ([place]);  Surgeon: Birder Robson, MD;  Location: [facility] ORS;  Service: Ophthalmology;  Laterality: Right;  [place] 00:32.6 AP% 13.8 CDE 4.51 Fluid Pack Lot # [id number]  . EYE SURGERY    . TONSILLECTOMY       Prior to Admission medications   Medication Sig Start Date End Date Taking? Authorizing Provider  Abatacept (ORENCIA IV) Inject 1 Dose into the vein every 30 (thirty) days.    [provider]  Acetaminophen 500 MG capsule Take by mouth.    [provider]  aspirin 81 MG chewable tablet Chew 81 mg by mouth daily with breakfast.    [provider]  Calcium Citrate-Vitamin D ([employer] CALCIUM CITRATE +D3 MINI) 200-250 MG-UNIT TABS Take 1 tablet by mouth daily after breakfast.     [provider]  carbamazepine (TEGRETOL XR) 200 MG 12 hr tablet Take 2 tablets (400 mg total) by mouth 2 (two) times daily. 11/22/19   Ursula Alert, MD  cariprazine (VRAYLAR) capsule Take 1 capsule (1.5 mg total) by mouth daily. At bedtime 01/01/20   Ursula Alert, MD  divalproex (DEPAKOTE ER) 500 MG 24 hr tablet Take 1 tablet twice a day Patient taking differently: Take 500 mg by mouth 2 (two) times daily. Take 1 tablet twice a day 11/22/19   Ursula Alert, MD  eszopiclone (LUNESTA) 1 MG TABS tablet Take 1-2 tablets (1-2 mg total) by mouth at bedtime as needed for sleep. Take immediately before bedtime 01/01/20   Ursula Alert, MD  fluticasone Asencion Islam) 50 MCG/ACT nasal spray  03/16/18   [provider]  folic acid (FOLVITE) 1 MG tablet Take 2 mg by mouth daily with breakfast.  08/29/14   [provider]  gabapentin (NEURONTIN) 300 MG capsule Take 1 tablet twice a day Patient taking differently: Take 300 mg by mouth 2 (two) times daily. Take 1 tablet twice a day 11/22/19   Ursula Alert, MD  ibandronate (BONIVA) 150 MG tablet  03/17/18   [provider]  levothyroxine (SYNTHROID, LEVOTHROID) 75 MCG tablet Take 75 mcg by mouth daily before breakfast.  11/26/15   [provider]  methotrexate 2.5 MG tablet Take 15 mg by mouth every Wednesday. WEDNESDAYS AT LUNCH    [provider]  Multiple Vitamins-Minerals (MULTIVITAMIN WITH MINERALS)  tablet Take 1 tablet by mouth daily with breakfast.     [provider]  MYRBETRIQ 25 MG TB24 tablet Take 25 mg by mouth daily.  12/05/19   [provider]  pravastatin (PRAVACHOL) 40 MG tablet Take 40 mg by mouth daily.  06/19/19   [provider]    Allergies Amoxicillin-pot clavulanate, Oxycodone-acetaminophen, Erythromycin, Azithromycin, Codeine, Macrolides and ketolides, Oxycodone-acetaminophen, Penicillins, Remeron [mirtazapine], Tramadol, Zolpidem, Lamotrigine, and Sulfa antibiotics  Family History  Problem Relation Age of Onset  . Heart failure Father   . Parkinson's disease Mother   . Bipolar disorder Mother   . Depression Sister   . Schizophrenia Paternal Grandmother   . Diabetes Maternal Grandmother   . Pancreatic cancer Maternal Grandmother   . Cancer Neg Hx   . Heart disease Neg Hx   . Breast cancer Neg Hx     Social History Social History   Tobacco Use  . Smoking status: Never Smoker  . Smokeless tobacco: Former Network engineer  . Vaping Use: Never used  Substance Use Topics  . Alcohol use: No    Alcohol/week: 0.0 standard drinks  . Drug use: No    Review of Systems  Review of Systems  Constitutional: Negative for chills and fever.  HENT: Negative for sore throat.   Eyes: Negative for pain.  Respiratory: Negative for cough and stridor.   Cardiovascular: Negative for chest pain.  Gastrointestinal: Negative for vomiting.  Genitourinary: Positive for dysuria.  Skin: Negative for rash.  Neurological: Negative for seizures, loss of consciousness and headaches.  Psychiatric/Behavioral: Positive for depression. Negative for suicidal ideas. The patient is nervous/anxious and has insomnia.   All other systems reviewed and are negative.     ____________________________________________   PHYSICAL EXAM:  VITAL SIGNS: ED Triage Vitals  Enc Vitals Group     BP 01/02/20 0925 (!) 144/88     Pulse Rate 01/02/20 0925 95     Resp  01/02/20 0925 18     Temp 01/02/20 0925 98 F (36.7 C)     Temp src --      SpO2 01/02/20 0925 95 %     Weight 01/02/20 0926 110 lb (49.9 kg)     Height 01/02/20 0926 5' (1.524 m)     Head Circumference --      Peak Flow --      Pain Score 01/02/20 0926 0     Pain Loc --      Pain Edu? --      Excl. in [place]? --    Vitals:   01/02/20 0925  BP: (!) 144/88  Pulse: 95  Resp: 18  Temp: 98 F (36.7 C)  SpO2: 95%   Physical Exam Vitals and nursing note reviewed.  Constitutional:      General: She is not in acute distress.    Appearance: She is well-developed.  HENT:     Head: Normocephalic and atraumatic.     Right Ear: External ear normal.     Left Ear: External ear normal.     Nose: Nose normal.  Eyes:     Conjunctiva/sclera: Conjunctivae normal.  Cardiovascular:     Rate and Rhythm: Normal rate and regular rhythm.     Heart sounds: No murmur heard.   Pulmonary:     Effort: Pulmonary effort is normal. No respiratory distress.     Breath sounds: Normal breath sounds.  Abdominal:     Palpations: Abdomen is soft.     Tenderness: There is no abdominal tenderness.  Musculoskeletal:     Cervical back: Neck supple.  Skin:    General: Skin is warm and dry.     Capillary Refill: Capillary refill takes less than 2 seconds.  Neurological:     General: No focal deficit present.     Mental Status: She is alert and oriented to person, place, and time.  Psychiatric:        Mood and Affect: Mood is anxious and depressed.        Speech: Speech is rapid and pressured.        Thought Content: Thought content does not include homicidal or suicidal ideation.      ____________________________________________   LABS (all labs ordered are listed, but only abnormal results are displayed)  Labs Reviewed  COMPREHENSIVE METABOLIC PANEL - Abnormal; Notable for the following components:      Result Value   Glucose, Bld 106 (*)    Creatinine, Ser 0.41 (*)    All other components  within normal limits  CBC -  Abnormal; Notable for the following components:   MCV 104.4 (*)    MCH 35.0 (*)    All other components within normal limits  URINALYSIS, COMPLETE (UACMP) WITH MICROSCOPIC - Abnormal; Notable for the following components:   Color, Urine YELLOW (*)    APPearance HAZY (*)    Hgb urine dipstick SMALL (*)    Leukocytes,Ua MODERATE (*)    WBC, UA >50 (*)    All other components within normal limits  RESPIRATORY PANEL BY RT PCR (FLU A&B, COVID)  URINE CULTURE  ETHANOL  URINE DRUG SCREEN, QUALITATIVE ([facility] ONLY)  TSH  GLUCOSE, CAPILLARY  LIPID PANEL  CARBAMAZEPINE LEVEL, TOTAL  VALPROIC ACID LEVEL   ____________________________________________  EKG  None  ____________________________________________    ____________________________________________   INITIAL IMPRESSION / ASSESSMENT AND PLAN / ED COURSE        Patient presents with [place] to history exam for assessment of some worsening depression and anxiety in the setting of inability to sleep.  Patient also complains of some burning with urination.  Patient is slightly hypertensive with a BP of 144/88 with otherwise stable vital signs on arrival.  With regard to patient's dysuria UA is consistent with cystitis.  No fever, CVA tenderness, abdominal tenderness, or other acute findings to suggest pyelonephritis or sepsis.  Fosfomycin ordered as below.  Low suspicion that this is the primary driver of patient's psychiatric symptoms.  Patient does have a history of bipolar disorder and states that she sometimes gets like this during seasonal changes.  She is requesting to talk to psychiatry.  TTS and psychiatry services were consulted.  Do not believe patient requires IVC at this time.  Routine psychiatric labs including CBC, CMP, ethanol, UDS, and TSH were sent and reviewed.  CMP is unremarkable for evidence of significant electrolyte derangements.  Patient CBC she does not appear anemic she has no leukocytosis  or abnormality platelets.  UDS is negative.  TSH is WNL.  Urine culture sent.  The patient has been placed in psychiatric observation due to the need to provide a safe environment for the patient while obtaining psychiatric consultation and evaluation, as well as ongoing medical and medication management to treat the patient's condition.  The patient has not been placed under full IVC at this time.   ____________________________________________   FINAL CLINICAL IMPRESSION(S) / ED DIAGNOSES  Final diagnoses:  Urinary tract infection with hematuria, site unspecified  Manic behavior (HCC)  Anxiety  Depression, unspecified depression type    Medications  aspirin chewable tablet 81 mg (has no administration in time range)  levothyroxine (SYNTHROID) tablet 75 mcg (75 mcg Oral Given 01/02/20 1306)  pravastatin (PRAVACHOL) tablet 40 mg (has no administration in time range)  acetaminophen (TYLENOL) tablet 1,000 mg (has no administration in time range)  fosfomycin (MONUROL) packet 3 g (has no administration in time range)     ED Discharge Orders    None       Note:  This document was prepared using Dragon voice recognition software and may include unintentional dictation errors.   Lucrezia Starch, MD 01/02/20 1440

## 2020-01-02 NOTE — Tx Team (Signed)
Initial Treatment Plan 01/02/2020 6:11 PM Stephanie [name] IDX:[id number]    PATIENT STRESSORS: Financial difficulties Health problems   PATIENT STRENGTHS: Communication skills Motivation for treatment/growth   PATIENT IDENTIFIED PROBLEMS: Ineffective coping skills  Financial issues                   DISCHARGE CRITERIA:  Improved stabilization in mood, thinking, and/or behavior Motivation to continue treatment in a less acute level of care  PRELIMINARY DISCHARGE PLAN: Outpatient therapy Return to previous living arrangement  PATIENT/FAMILY INVOLVEMENT: This treatment plan has been presented to and reviewed with the patient, Stephanie [name], and/or family member.  The patient and family have been given the opportunity to ask questions and make suggestions.  Aleen Sells, RN 01/02/2020, 6:11 PM

## 2020-01-03 ENCOUNTER — Telehealth: Payer: Self-pay

## 2020-01-03 DIAGNOSIS — F3112 Bipolar disorder, current episode manic without psychotic features, moderate: Secondary | ICD-10-CM | POA: Diagnosis not present

## 2020-01-03 MED ORDER — QUETIAPINE FUMARATE ER 50 MG PO TB24
50.0000 mg | ORAL_TABLET | Freq: Every day | ORAL | Status: DC
  Administered 2020-01-03 – 2020-01-06 (×4): 50 mg via ORAL
  Filled 2020-01-03 (×5): qty 1

## 2020-01-03 MED ORDER — TEMAZEPAM 7.5 MG PO CAPS
7.5000 mg | ORAL_CAPSULE | Freq: Every evening | ORAL | Status: DC | PRN
  Administered 2020-01-05 – 2020-01-06 (×3): 7.5 mg via ORAL
  Filled 2020-01-03 (×3): qty 1

## 2020-01-03 MED ORDER — CALCIUM CARBONATE-VITAMIN D 500-200 MG-UNIT PO TABS
0.5000 | ORAL_TABLET | Freq: Every day | ORAL | Status: DC
  Administered 2020-01-03 – 2020-01-11 (×9): 0.5 via ORAL
  Filled 2020-01-03 (×9): qty 0.5

## 2020-01-03 NOTE — [facility] Group Notes (Signed)
[facility] Group Notes:  (Nursing/MHT/Case Management/Adjunct)  Date:  01/03/2020  Time:  10:20 PM  Type of Therapy:  Group Therapy  Participation Level:  Did Not Attend Nehemiah Settle 01/03/2020, 10:20 PM

## 2020-01-03 NOTE — Progress Notes (Signed)
Recreation Therapy Notes   Date: 01/03/2020  Time: 9:30 am   Location: Craft room   Behavioral response: Appropriate  Intervention Topic: Happiness   Discussion/Intervention:  Group content today was focused on Happiness. The group defined happiness and described where happiness comes from. Individuals identified what makes them happy and how they go about making others happy. Patients expressed things that stop them from being happy and ways they can improve their happiness. The group stated reasons why it is important to be happy. The group participated in the intervention "My Happiness", where they had a chance to identify and express things that make them happy.   Clinical Observations/Feedback: Patient came to group late due unknown reasons and was focused on what peers and staff had to say about happiness. Individual was social with peers and staff while participating in the intervention.  Indy Kuck LRT/CTRS          Stephanie [name] 01/03/2020 1:10 PM

## 2020-01-03 NOTE — Evaluation (Signed)
Physical Therapy Evaluation Patient Details Name: Stephanie [name] MRN: [id number] DOB: 24-Feb-1946 Today's Date: 01/03/2020   History of Present Illness  Stephanie [name] is a 44yoF who comes to [facility] 01/02/20 at requrest of her psychaitrist due to 4 days of sleeplessness and manic episodes. PMH:  GERD, fibromyalgia, IBS, Sjogren's disease, RA, OSA, CAD, depression, and BPD. Pt was hoping to again start OPOT soon for her RA of hands.  Clinical Impression  Pt admitted with above diagnosis. Pt currently with functional limitations due to the deficits listed below (see "PT Problem List"). Upon entry, pt in bed, awake and agreeable to participate. The pt is alert and oriented x4, pleasant, conversational, and generally a good historian. Pt reports normally using SPC at home, but is using RW in [place] as [facility] is not permitted due to safety restrictions. Pt has been moving well around the unit with RW, but having difficulty with managing heavy double doors in [place]. Pt also asking about availability to shoes due to RA related foot/ankle arthritic changes- later discussed with RN, who reports lace-less shoes are accepted. Pt performs all basic mobility with modified independence, has good safety awareness of her limitations/equipment use, good safety awareness. Pt would benefit from more gait training with RW to help make more safe her transitions on BHU. Pt has chronic balance deficits and currently appears to be close to her baseline. Pt will benefit from skilled PT intervention to increase independence and safety with basic mobility in preparation for discharge to the venue listed below.      Follow Up Recommendations Outpatient PT;Supervision - Intermittent;Other (comment) (Pt would like to start OPOT at [facility] again for bimanual RA deficits)    Equipment Recommendations  None recommended by PT    Recommendations for Other Services       Precautions / Restrictions Precautions Precautions:  Fall Restrictions Weight Bearing Restrictions: No      Mobility  Bed Mobility Overal bed mobility: Independent                  Transfers Overall transfer level: Modified independent Equipment used: Rolling walker (2 wheeled)                Ambulation/Gait Ambulation/Gait assistance: Supervision;Min guard Gait Distance (Feet): 280 Feet Assistive device: Rolling walker (2 wheeled);None Gait Pattern/deviations: Step-through pattern     General Gait Details: Can AMB modI with RW, but struggles with doors in BHU due to door weight and low patient weight; AMB without device is performed at minguardA due to easy LOB with head turns, etc.  Stairs            Wheelchair Mobility    Modified Rankin (Stroke Patients Only)       Balance                                 Standardized Balance Assessment Standardized Balance Assessment : Berg Balance Test Berg Balance Test Sit to Stand: Able to stand without using hands and stabilize independently Standing Unsupported: Able to stand safely 2 minutes Sitting with Back Unsupported but Feet Supported on Floor or Stool: Able to sit safely and securely 2 minutes Stand to Sit: Sits safely with minimal use of hands Transfers: Able to transfer safely, minor use of hands Standing Unsupported with Eyes Closed: Able to stand 10 seconds safely Standing Ubsupported with Feet Together: Able to place feet together independently and stand  for 1 minute with supervision From Standing, Reach Forward with Outstretched Arm: Can reach forward >12 cm safely (5") From Standing Position, Pick up Object from Floor: Able to pick up shoe, needs supervision From Standing Position, Turn to Look Behind Over each Shoulder: Turn sideways only but maintains balance Turn 360 Degrees: Able to turn 360 degrees safely but slowly Standing Unsupported, Alternately Place Feet on Step/Stool: Able to complete 4 steps without aid or  supervision Standing Unsupported, One Foot in Front: Able to take small step independently and hold 30 seconds Standing on One Leg: Tries to lift leg/unable to hold 3 seconds but remains standing independently Total Score: 42         Pertinent Vitals/Pain Pain Assessment: No/denies pain    Home Living Family/patient expects to be discharged to:: Private residence Living Arrangements: Alone Available Help at Discharge: Family;Available PRN/intermittently (DTR, Son, 'Church Family') Type of Home: House Home Access: Stairs to enter Entrance Stairs-Rails: Right Entrance Stairs-Number of Steps: 2 Home Layout: One level Home Equipment: Environmental consultant - 2 wheels;Walker - 4 wheels;Kasandra Knudsen - single point      Prior Function Level of Independence: Independent with assistive device(s)         Comments: SPC AMB, recently commenced driving and IADL performance after convalescence s/p cervical fracture in May 2021     Hand Dominance        Extremity/Trunk Assessment   Upper Extremity Assessment Upper Extremity Assessment: Overall WFL for tasks assessed;Generalized weakness    Lower Extremity Assessment Lower Extremity Assessment: Overall WFL for tasks assessed       Communication      Cognition Arousal/Alertness: Awake/alert Behavior During Therapy: WFL for tasks assessed/performed;Restless Overall Cognitive Status: Within Functional Limits for tasks assessed                                        General Comments      Exercises Other Exercises Other Exercises: Door management training with RW: pushing door only. (2x FWD unsuccessful; 2x posterior c RW successful, but taxing)   Assessment/Plan    PT Assessment Patient needs continued PT services  PT Problem List Decreased strength;Decreased activity tolerance;Decreased balance;Decreased mobility;Decreased knowledge of use of DME       PT Treatment Interventions DME instruction;Balance training;Gait  training;Stair training;Functional mobility training;Therapeutic activities;Therapeutic exercise;Patient/family education    PT Goals (Current goals can be found in the Care Plan section)  Acute Rehab PT Goals Patient Stated Goal: be as independent as possible, avoid relying on others too much. PT Goal Formulation: With patient Time For Goal Achievement: 01/17/20 Potential to Achieve Goals: Good    Frequency Min 2X/week   Barriers to discharge        Co-evaluation               AM-PAC PT "6 Clicks" Mobility  Outcome Measure Help needed turning from your back to your side while in a flat bed without using bedrails?: None Help needed moving from lying on your back to sitting on the side of a flat bed without using bedrails?: None Help needed moving to and from a bed to a chair (including a wheelchair)?: None Help needed standing up from a chair using your arms (e.g., wheelchair or bedside chair)?: None Help needed to walk in hospital room?: None Help needed climbing 3-5 steps with a railing? : A Little 6 Click Score: 23  End of Session Equipment Utilized During Treatment: Gait belt Activity Tolerance: Patient tolerated treatment well;Patient limited by fatigue Patient left: in bed Nurse Communication: Mobility status PT Visit Diagnosis: Unsteadiness on feet (R26.81);Other abnormalities of gait and mobility (R26.89);Muscle weakness (generalized) (M62.81)    Time: 1610-9604 PT Time Calculation (min) (ACUTE ONLY): 40 min   Charges:   PT Evaluation $PT Eval Low Complexity: 1 Low PT Treatments $Gait Training: 8-22 mins        4:10 PM, 01/03/20 Etta Grandchild, PT, DPT Physical Therapist - [facility]  [phone] ([place])    Slater C 01/03/2020, 4:09 PM

## 2020-01-03 NOTE — Tx Team (Signed)
Interdisciplinary Treatment and Diagnostic Plan Update  01/03/2020 Time of Session: 9:00AM Stephanie [name] MRN: [id number]  Principal Diagnosis: Bipolar 1 disorder, manic, moderate (HCC)  Secondary Diagnoses: Principal Problem:   Bipolar 1 disorder, manic, moderate ([place]) Active Problems:   H/O gastrointestinal disease   GAD (generalized anxiety disorder)   Insomnia, persistent   Current Medications:  Current Facility-Administered Medications  Medication Dose Route Frequency Provider Last Rate Last Admin  . acetaminophen (TYLENOL) tablet 650 mg  650 mg Oral Q6H PRN Clapacs, Madie Reno, MD   650 mg at 01/03/20 1606  . alum & mag hydroxide-simeth (MAALOX/MYLANTA) 200-200-20 MG/5ML suspension 30 mL  30 mL Oral Q4H PRN Clapacs, John T, MD      . aspirin chewable tablet 81 mg  81 mg Oral Q breakfast Clapacs, Madie Reno, MD   81 mg at 01/03/20 4920  . calcium-vitamin D (OSCAL WITH D) 500-200 MG-UNIT per tablet 0.5 tablet  0.5 tablet Oral Q breakfast Salley Scarlet, MD   0.5 tablet at 01/03/20 1224  . carbamazepine (TEGRETOL XR) 12 hr tablet 400 mg  400 mg Oral BID Clapacs, Madie Reno, MD   400 mg at 01/03/20 1606  . divalproex (DEPAKOTE ER) 24 hr tablet 500 mg  500 mg Oral BID Clapacs, Madie Reno, MD   500 mg at 01/03/20 1606  . fluticasone (FLONASE) 50 MCG/ACT nasal spray 2 spray  2 spray Each Nare Daily Clapacs, Madie Reno, MD   2 spray at 01/03/20 [phone]  . folic acid (FOLVITE) tablet 2 mg  2 mg Oral Q breakfast Clapacs, Madie Reno, MD   2 mg at 01/03/20 1219  . gabapentin (NEURONTIN) capsule 300 mg  300 mg Oral BID Clapacs, John T, MD   300 mg at 01/03/20 1606  . hydrALAZINE (APRESOLINE) tablet 25 mg  25 mg Oral Q6H PRN Salley Scarlet, MD   25 mg at 01/03/20 7588  . levothyroxine (SYNTHROID) tablet 75 mcg  75 mcg Oral Q0600 Salley Scarlet, MD   75 mcg at 01/03/20 [phone]  . magnesium hydroxide (MILK OF MAGNESIA) suspension 30 mL  30 mL Oral Daily PRN Clapacs, John T, MD      . methotrexate (RHEUMATREX) tablet  15 mg  15 mg Oral Q Wed Clapacs, John T, MD   15 mg at 01/03/20 1224  . mirabegron ER (MYRBETRIQ) tablet 25 mg  25 mg Oral Daily Clapacs, Madie Reno, MD   25 mg at 01/03/20 9826  . multivitamin with minerals tablet 1 tablet  1 tablet Oral Q breakfast Clapacs, Madie Reno, MD   1 tablet at 01/03/20 [phone]  . pravastatin (PRAVACHOL) tablet 20 mg  20 mg Oral q1800 Clapacs, John T, MD      . QUEtiapine (SEROQUEL XR) 24 hr tablet 50 mg  50 mg Oral QHS Selina Cooley M, MD      . temazepam (RESTORIL) capsule 7.5 mg  7.5 mg Oral QHS PRN Salley Scarlet, MD       PTA Medications: Medications Prior to Admission  Medication Sig Dispense Refill Last Dose  . Abatacept (ORENCIA IV) Inject 1 Dose into the vein every 30 (thirty) days.     . Acetaminophen 500 MG capsule Take by mouth.     Marland Kitchen aspirin 81 MG chewable tablet Chew 81 mg by mouth daily with breakfast.     . Calcium Citrate-Vitamin D ([employer] CALCIUM CITRATE +D3 MINI) 200-250 MG-UNIT TABS Take 1 tablet by mouth daily after breakfast.      .  carbamazepine (TEGRETOL XR) 200 MG 12 hr tablet Take 2 tablets (400 mg total) by mouth 2 (two) times daily. 360 tablet 1   . cariprazine (VRAYLAR) capsule Take 1 capsule (1.5 mg total) by mouth daily. At bedtime 30 capsule 1   . divalproex (DEPAKOTE ER) 500 MG 24 hr tablet Take 1 tablet twice a day (Patient taking differently: Take 500 mg by mouth 2 (two) times daily. Take 1 tablet twice a day) 180 tablet 1   . eszopiclone (LUNESTA) 1 MG TABS tablet Take 1-2 tablets (1-2 mg total) by mouth at bedtime as needed for sleep. Take immediately before bedtime 15 tablet 1   . fluticasone (FLONASE) 50 MCG/ACT nasal spray      . folic acid (FOLVITE) 1 MG tablet Take 2 mg by mouth daily with breakfast.      . gabapentin (NEURONTIN) 300 MG capsule Take 1 tablet twice a day (Patient taking differently: Take 300 mg by mouth 2 (two) times daily. Take 1 tablet twice a day) 180 capsule 1   . ibandronate (BONIVA) 150 MG tablet      .  levothyroxine (SYNTHROID, LEVOTHROID) 75 MCG tablet Take 75 mcg by mouth daily before breakfast.      . methotrexate 2.5 MG tablet Take 15 mg by mouth every Wednesday. WEDNESDAYS AT LUNCH     . Multiple Vitamins-Minerals (MULTIVITAMIN WITH MINERALS) tablet Take 1 tablet by mouth daily with breakfast.      . MYRBETRIQ 25 MG TB24 tablet Take 25 mg by mouth daily.      . pravastatin (PRAVACHOL) 40 MG tablet Take 40 mg by mouth daily.        Patient Stressors: Financial difficulties Health problems  Patient Strengths: Agricultural engineer for treatment/growth  Treatment Modalities: Medication Management, Group therapy, Case management,  1 to 1 session with clinician, Psychoeducation, Recreational therapy.   Physician Treatment Plan for Primary Diagnosis: Bipolar 1 disorder, manic, moderate (HCC) Long Term Goal(s): Improvement in symptoms so as ready for discharge Improvement in symptoms so as ready for discharge   Short Term Goals: Ability to identify changes in lifestyle to reduce recurrence of condition will improve Ability to verbalize feelings will improve Ability to disclose and discuss suicidal ideas Ability to demonstrate self-control will improve Ability to identify and develop effective coping behaviors will improve Ability to identify changes in lifestyle to reduce recurrence of condition will improve Ability to verbalize feelings will improve Ability to disclose and discuss suicidal ideas Ability to demonstrate self-control will improve Ability to identify and develop effective coping behaviors will improve  Medication Management: Evaluate patient's response, side effects, and tolerance of medication regimen.  Therapeutic Interventions: 1 to 1 sessions, Unit Group sessions and Medication administration.  Evaluation of Outcomes: Not Progressing  Physician Treatment Plan for Secondary Diagnosis: Principal Problem:   Bipolar 1 disorder, manic, moderate  (HCC) Active Problems:   H/O gastrointestinal disease   GAD (generalized anxiety disorder)   Insomnia, persistent  Long Term Goal(s): Improvement in symptoms so as ready for discharge Improvement in symptoms so as ready for discharge   Short Term Goals: Ability to identify changes in lifestyle to reduce recurrence of condition will improve Ability to verbalize feelings will improve Ability to disclose and discuss suicidal ideas Ability to demonstrate self-control will improve Ability to identify and develop effective coping behaviors will improve Ability to identify changes in lifestyle to reduce recurrence of condition will improve Ability to verbalize feelings will improve Ability to disclose and discuss  suicidal ideas Ability to demonstrate self-control will improve Ability to identify and develop effective coping behaviors will improve     Medication Management: Evaluate patient's response, side effects, and tolerance of medication regimen.  Therapeutic Interventions: 1 to 1 sessions, Unit Group sessions and Medication administration.  Evaluation of Outcomes: Not Progressing   RN Treatment Plan for Primary Diagnosis: Bipolar 1 disorder, manic, moderate (HCC) Long Term Goal(s): Knowledge of disease and therapeutic regimen to maintain health will improve  Short Term Goals: Ability to verbalize frustration and anger appropriately will improve, Ability to demonstrate self-control, Ability to participate in decision making will improve, Ability to verbalize feelings will improve, Ability to identify and develop effective coping behaviors will improve and Compliance with prescribed medications will improve  Medication Management: RN will administer medications as ordered by provider, will assess and evaluate patient's response and provide education to patient for prescribed medication. RN will report any adverse and/or side effects to prescribing provider.  Therapeutic Interventions:  1 on 1 counseling sessions, Psychoeducation, Medication administration, Evaluate responses to treatment, Monitor vital signs and CBGs as ordered, Perform/monitor CIWA, COWS, AIMS and Fall Risk screenings as ordered, Perform wound care treatments as ordered.  Evaluation of Outcomes: Not Progressing   LCSW Treatment Plan for Primary Diagnosis: Bipolar 1 disorder, manic, moderate ([place]) Long Term Goal(s): Safe transition to appropriate next level of care at discharge, Engage patient in therapeutic group addressing interpersonal concerns.  Short Term Goals: Engage patient in aftercare planning with referrals and resources, Increase social support, Increase ability to appropriately verbalize feelings, Increase emotional regulation, Facilitate acceptance of mental health diagnosis and concerns, Identify triggers associated with mental health/substance abuse issues and Increase skills for wellness and recovery  Therapeutic Interventions: Assess for all discharge needs, 1 to 1 time with Social worker, Explore available resources and support systems, Assess for adequacy in community support network, Educate family and significant other(s) on suicide prevention, Complete Psychosocial Assessment, Interpersonal group therapy.  Evaluation of Outcomes: Not Progressing   Progress in Treatment: Attending groups: No. Participating in groups: No. Taking medication as prescribed: Yes. Toleration medication: Yes. Family/Significant other contact made: No, will contact:  when given permission. Patient understands diagnosis: Yes. Discussing patient identified problems/goals with staff: Yes. Medical problems stabilized or resolved: Yes. Denies suicidal/homicidal ideation: Yes. Issues/concerns per patient self-inventory: No. Other: None.  New problem(s) identified: No, Describe:  None.  New Short Term/Long Term Goal(s): medication management for mood stabilization; development of comprehensive mental  wellness/sobriety plan.  Patient Goals: "To get better sleep."   Discharge Plan or Barriers: CSW will assist pt with development of aftercare plan.   Reason for Continuation of Hospitalization: [place] Medical Issues Medication stabilization  Estimated Length of Stay: 1-7 days  Attendees: Patient:Stephanie [name] 01/03/2020 4:33 PM  Physician: Selina Cooley, MD 01/03/2020 4:33 PM  Nursing: Graceann Congress, RN 01/03/2020 4:33 PM  RN Care Manager: 01/03/2020 4:33 PM  Social Worker: Assunta Curtis, MSW, LCSW 01/03/2020 4:33 PM  Recreational Therapist: Devin Going, LRT 01/03/2020 4:33 PM  Other: Chalmers Guest. Guerry Bruin, MSW, [place], [place] 01/03/2020 4:33 PM  Other:  01/03/2020 4:33 PM  Other: 01/03/2020 4:33 PM    Scribe for Treatment Team: Shirl Harris, LCSW 01/03/2020 4:33 PM

## 2020-01-03 NOTE — H&P (Signed)
Psychiatric Admission Assessment Adult  Patient Identification: Stephanie [name] MRN:  [id number] Date of Evaluation:  01/03/2020 Chief Complaint:  Bipolar 1 disorder, manic, moderate (HCC) [F31.12] Principal Diagnosis: Bipolar 1 disorder, manic, moderate ([place]) Diagnosis:  Principal Problem:   Bipolar 1 disorder, manic, moderate ([place]) Active Problems:   H/O gastrointestinal disease   GAD (generalized anxiety disorder)   Insomnia, persistent  History of Present Illness: Patient seen during treatment team and agani one-on-one at bedside. She is acutely manic with pressured and rapid speech, and tangential thoughts. She is very pleasant and cooperative on exam, and frequently apologizes for getting off topic. She notes that she has not slept in almost 4 days, and knows she is manic. For her trouble sleeping comes first, then problems concentrating, then impulsivity. She has remained complaint with Depakote and Carbamezapine with levels 60 and 8.8 respectively. She has been on numerous agents to assist with sleep. Lunesta ineffective, Ambien caused sleep walking. She has previously been on Seroquel which caused dizziness around 200 mg. Will see if lower dose will assist with sleep and acute mania. Will also add Temazepam 7.5 mg PRN if still unable to sleep. RBA of both antipsychotic and benzodiazepine discussed, and patient agreeable with plan.   Associated Signs/Symptoms: Depression Symptoms:  insomnia, Duration of Depression Symptoms: No data recorded (Hypo) Manic Symptoms:  Distractibility, Elevated Mood, Flight of Ideas, Impulsivity, Anxiety Symptoms:  Excessive Worry, Panic Symptoms, Psychotic Symptoms:  None Duration of Psychotic Symptoms: No data recorded PTSD Symptoms: Negative Total Time spent with patient: 1.5 hours  Past Psychiatric History: Longstanding diagnosis of bipolar disorder.  It looks like separate diagnoses of anxiety disorders have been entertained as well.  He has  had previous hospitalizations but reports that the last time was many years ago.  Denies any past suicide attempts.  He is currently on a combination of Tegretol and Depakote as her primary medications.  She claims that she has been tried on other medicines but has developed a fear that she would have tardive dyskinesia.  In fact she claims that somebody told her she already has tardive dyskinesia which is got her even more nervous.  Is the patient at risk to self? Yes.    Has the patient been a risk to self in the past 6 months? Yes.    Has the patient been a risk to self within the distant past? Yes.    Is the patient a risk to others? No.  Has the patient been a risk to others in the past 6 months? No.  Has the patient been a risk to others within the distant past? No.   Prior Inpatient Therapy:   Prior Outpatient Therapy:    Alcohol Screening: 1. How often do you have a drink containing alcohol?: Never 2. How many drinks containing alcohol do you have on a typical day when you are drinking?: 1 or 2 3. How often do you have six or more drinks on one occasion?: Never AUDIT-C Score: 0 4. How often during the last year have you found that you were not able to stop drinking once you had started?: Never 5. How often during the last year have you failed to do what was normally expected from you because of drinking?: Never 6. How often during the last year have you needed a first drink in the morning to get yourself going after a heavy drinking session?: Never 7. How often during the last year have you had a feeling of guilt  of remorse after drinking?: Never 8. How often during the last year have you been unable to remember what happened the night before because you had been drinking?: Never 9. Have you or someone else been injured as a result of your drinking?: No 10. Has a relative or friend or a doctor or another health worker been concerned about your drinking or suggested you cut down?:  No Alcohol Use Disorder Identification Test Final Score (AUDIT): 0 Alcohol Brief Interventions/Follow-up: AUDIT Score <7 follow-up not indicated Substance Abuse History in the last 12 months:  No. Consequences of Substance Abuse: Negative Previous Psychotropic Medications: Yes  Psychological Evaluations: Yes  Past Medical History:  Past Medical History:  Diagnosis Date  . Anginal pain ([place])   . Anxiety   . Bipolar 1 disorder ([place])   . Bipolar disorder ([place])   . Coronary artery disease   . Depression   . Fatigue   . Fibromyalgia   . GERD (gastroesophageal reflux disease)   . Heart disease   . Heart failure ([place])   . HLD (hyperlipidemia)   . HOH (hard of hearing)   . Hypothyroidism   . IBS (irritable bowel syndrome)   . Pelvic pain in female   . Perianal lesion    high grade sil lesion- keratinizing type  . RA (rheumatoid arthritis) ([place])   . Raynaud phenomenon   . Sjogren's disease ([place])   . Sleep apnea   . Vaginal Pap smear, abnormal     Past Surgical History:  Procedure Laterality Date  . CATARACT EXTRACTION W/PHACO Left 02/23/2017   Procedure: CATARACT EXTRACTION PHACO AND INTRAOCULAR LENS PLACEMENT (IOC);  Surgeon: Birder Robson, MD;  Location: [facility] ORS;  Service: Ophthalmology;  Laterality: Left;  [place] 00:35 AP% 9.6 CDE 3.47 Fluid pack lot # [id number]  . CATARACT EXTRACTION W/PHACO Right 03/30/2017   Procedure: CATARACT EXTRACTION PHACO AND INTRAOCULAR LENS PLACEMENT ([place]);  Surgeon: Birder Robson, MD;  Location: [facility] ORS;  Service: Ophthalmology;  Laterality: Right;  [place] 00:32.6 AP% 13.8 CDE 4.51 Fluid Pack Lot # [id number]  . EYE SURGERY    . TONSILLECTOMY     Family History:  Family History  Problem Relation Age of Onset  . Heart failure Father   . Parkinson's disease Mother   . Bipolar disorder Mother   . Depression Sister   . Schizophrenia Paternal Grandmother   . Diabetes Maternal Grandmother   . Pancreatic cancer Maternal Grandmother   . Cancer  Neg Hx   . Heart disease Neg Hx   . Breast cancer Neg Hx    Family Psychiatric  History: Mother with bipolar I disorder, paternal grandfather with schizophrenia. Sister with mental health issues as well.  Tobacco Screening: Have you used any form of tobacco in the last 30 days? (Cigarettes, Smokeless Tobacco, Cigars, and/or Pipes): No Social History:  Social History   Substance and Sexual Activity  Alcohol Use No  . Alcohol/week: 0.0 standard drinks     Social History   Substance and Sexual Activity  Drug Use No    Additional Social History:                           Allergies:   Allergies  Allergen Reactions  . Amoxicillin-Pot Clavulanate Nausea Only and Other (See Comments)    Diarrhea Has patient had a PCN reaction causing immediate rash, facial/tongue/throat swelling, SOB or lightheadedness with hypotension:  Has patient had a PCN reaction causing severe rash  involving mucus membranes or skin necrosis: Has patient had a PCN reaction that required hospitalization:  Has patient had a PCN reaction occurring within the last 10 years: If all of the above answers are "NO", then may proceed with Cephalosporin use.   Marland Kitchen Oxycodone-Acetaminophen Nausea Only  . Erythromycin Nausea Only  . Azithromycin Nausea And Vomiting  . Codeine   . Macrolides And Ketolides   . Oxycodone-Acetaminophen   . Penicillins Other (See Comments)    Has patient had a PCN reaction causing immediate rash, facial/tongue/throat swelling, SOB or lightheadedness with hypotension: NO Has patient had a PCN reaction causing severe rash involving mucus membranes or skin necrosis: NO Has patient had a PCN reaction that required hospitalization: NO Has patient had a PCN reaction occurring within the last 10 years: NO If all of the above answers are "NO", then may proceed with Cephalosporin use.   . Remeron [Mirtazapine]     Side effect-likely tardive dyskinesia  . Tramadol Nausea And Vomiting  .  Zolpidem     Sleep walking  . Lamotrigine Rash    LAMICTAL  . Sulfa Antibiotics Rash   Lab Results:  Results for orders placed or performed during the hospital encounter of 01/02/20 (from the past 48 hour(s))  Comprehensive metabolic panel     Status: Abnormal   Collection Time: 01/02/20  9:30 AM  Result Value Ref Range   Sodium 139 135 - 145 mmol/L   Potassium 4.2 3.5 - 5.1 mmol/L   Chloride 102 98 - 111 mmol/L   CO2 27 22 - 32 mmol/L   Glucose, Bld 106 (H) 70 - 99 mg/dL    Comment: Glucose reference range applies only to samples taken after fasting for at least 8 hours.   BUN 20 8 - 23 mg/dL   Creatinine, Ser 0.41 (L) 0.44 - 1.00 mg/dL   Calcium 9.4 8.9 - 10.3 mg/dL   Total Protein 6.9 6.5 - 8.1 g/dL   Albumin 4.0 3.5 - 5.0 g/dL   AST 32 15 - 41 U/L   ALT 20 0 - 44 U/L   Alkaline Phosphatase 54 38 - 126 U/L   Total Bilirubin 0.6 0.3 - 1.2 mg/dL   GFR, Estimated >60 >60 mL/min    Comment: (NOTE) Calculated using the CKD-EPI Creatinine Equation (2021)    Anion gap 10 5 - 15    Comment: Performed at [facility], [address] [place], [place] [address]  Ethanol     Status: None   Collection Time: 01/02/20  9:30 AM  Result Value Ref Range   Alcohol, Ethyl (B) <10 <10 mg/dL    Comment: (NOTE) Lowest detectable limit for serum alcohol is 10 mg/dL.  For medical purposes only. Performed at [facility], [address] [place], [place] [address]   cbc     Status: Abnormal   Collection Time: 01/02/20  9:30 AM  Result Value Ref Range   WBC 8.0 4.0 - 10.5 K/uL   RBC 4.09 3.87 - 5.11 MIL/uL   Hemoglobin 14.3 12.0 - 15.0 g/dL   HCT 42.7 36 - 46 %   MCV 104.4 (H) 80.0 - 100.0 fL   MCH 35.0 (H) 26.0 - 34.0 pg   MCHC 33.5 30.0 - 36.0 g/dL   RDW 13.1 11.5 - 15.5 %   Platelets 155 150 - 400 K/uL   nRBC 0.0 0.0 - 0.2 %    Comment: Performed at [facility], [address], [place], [place]  [address]  Urine Drug Screen, Qualitative     Status:  None   Collection Time: 01/02/20  9:30 AM  Result Value Ref Range   Tricyclic, Ur Screen NONE DETECTED NONE DETECTED   Amphetamines, Ur Screen NONE DETECTED NONE DETECTED   MDMA (Ecstasy)Ur Screen NONE DETECTED NONE DETECTED   Cocaine Metabolite,Ur [place] NONE DETECTED NONE DETECTED   Opiate, Ur Screen NONE DETECTED NONE DETECTED   Phencyclidine (PCP) Ur S NONE DETECTED NONE DETECTED   Cannabinoid 50 Ng, Ur [place] NONE DETECTED NONE DETECTED   Barbiturates, Ur Screen NONE DETECTED NONE DETECTED   Benzodiazepine, Ur Scrn NONE DETECTED NONE DETECTED   Methadone Scn, Ur NONE DETECTED NONE DETECTED    Comment: (NOTE) Tricyclics + metabolites, urine    Cutoff 1000 ng/mL Amphetamines + metabolites, urine  Cutoff 1000 ng/mL MDMA (Ecstasy), urine              Cutoff 500 ng/mL Cocaine Metabolite, urine          Cutoff 300 ng/mL Opiate + metabolites, urine        Cutoff 300 ng/mL Phencyclidine (PCP), urine         Cutoff 25 ng/mL Cannabinoid, urine                 Cutoff 50 ng/mL Barbiturates + metabolites, urine  Cutoff 200 ng/mL Benzodiazepine, urine              Cutoff 200 ng/mL Methadone, urine                   Cutoff 300 ng/mL  The urine drug screen provides only a preliminary, unconfirmed analytical test result and should not be used for non-medical purposes. Clinical consideration and professional judgment should be applied to any positive drug screen result due to possible interfering substances. A more specific alternate chemical method must be used in order to obtain a confirmed analytical result. Gas chromatography / mass spectrometry (GC/MS) is the preferred confirm atory method. Performed at [facility], [address] [place], [place] [address]   TSH     Status: None   Collection Time: 01/02/20  9:30 AM  Result Value Ref Range   TSH 2.678 0.350 - 4.500 uIU/mL    Comment: Performed by a 3rd Generation assay with a functional sensitivity of <=0.01 uIU/mL. Performed at  [facility], [address] [place], [place] [address]   Urinalysis, Complete w Microscopic     Status: Abnormal   Collection Time: 01/02/20  9:30 AM  Result Value Ref Range   Color, Urine YELLOW (A) YELLOW   APPearance HAZY (A) CLEAR   Specific Gravity, Urine 1.011 1.005 - 1.030   pH 7.0 5.0 - 8.0   Glucose, UA NEGATIVE NEGATIVE mg/dL   Hgb urine dipstick SMALL (A) NEGATIVE   Bilirubin Urine NEGATIVE NEGATIVE   Ketones, ur NEGATIVE NEGATIVE mg/dL   Protein, ur NEGATIVE NEGATIVE mg/dL   Nitrite NEGATIVE NEGATIVE   Leukocytes,Ua MODERATE (A) NEGATIVE   RBC / HPF 0-5 0 - 5 RBC/hpf   WBC, UA >50 (H) 0 - 5 WBC/hpf   Bacteria, UA NONE SEEN NONE SEEN   Squamous Epithelial / LPF 0-5 0 - 5   Mucus PRESENT     Comment: Performed at [facility], [address], [place],  [address]  Lipid panel     Status: Abnormal   Collection Time: 01/02/20  9:30 AM  Result Value Ref Range   Cholesterol 222 (  H) 0 - 200 mg/dL   Triglycerides 190 (H) <150 mg/dL   HDL 69 >40 mg/dL   Total CHOL/HDL Ratio 3.2 RATIO   VLDL 38 0 - 40 mg/dL   LDL Cholesterol 115 (H) 0 - 99 mg/dL    Comment:        Total Cholesterol/HDL:CHD Risk Coronary Heart Disease Risk Table                     Men   Women  1/2 Average Risk   3.4   3.3  Average Risk       5.0   4.4  2 X Average Risk   9.6   7.1  3 X Average Risk  23.4   11.0        Use the calculated Patient Ratio above and the CHD Risk Table to determine the patient's CHD Risk.        ATP III CLASSIFICATION (LDL):  <100     mg/dL   Optimal  100-129  mg/dL   Near or Above                    Optimal  130-159  mg/dL   Borderline  160-189  mg/dL   High  >190     mg/dL   Very High Performed at [facility], [address] [place], [place] [address]   Carbamazepine level, total     Status: None   Collection Time: 01/02/20  9:30 AM  Result Value Ref Range   Carbamazepine Lvl 8.8 4.0 - 12.0 ug/mL    Comment: Performed at  [facility], [address] [place], [place] [address]  Valproic acid level     Status: None   Collection Time: 01/02/20  9:30 AM  Result Value Ref Range   Valproic Acid Lvl 60 50.0 - 100.0 ug/mL    Comment: Performed at [facility], [address], [place], [place] [address]  Respiratory Panel by RT PCR (Flu A&B, Covid) - Nasopharyngeal Swab     Status: None   Collection Time: 01/02/20 10:49 AM   Specimen: Nasopharyngeal Swab  Result Value Ref Range   SARS Coronavirus 2 by RT PCR NEGATIVE NEGATIVE    Comment: (NOTE) SARS-CoV-2 target nucleic acids are NOT DETECTED.  The SARS-CoV-2 RNA is generally detectable in upper respiratoy specimens during the acute phase of infection. The lowest concentration of SARS-CoV-2 viral copies this assay can detect is 131 copies/mL. A negative result does not preclude SARS-Cov-2 infection and should not be used as the sole basis for treatment or other patient management decisions. A negative result may occur with  improper specimen collection/handling, submission of specimen other than nasopharyngeal swab, presence of viral mutation(s) within the areas targeted by this assay, and inadequate number of viral copies (<131 copies/mL). A negative result must be combined with clinical observations, patient history, and epidemiological information. The expected result is Negative.  Fact Sheet for Patients:  PinkCheek.be  Fact Sheet for Healthcare Providers:  GravelBags.it  This test is no t yet approved or cleared by the [place] FDA and  has been authorized for detection and/or diagnosis of SARS-CoV-2 by FDA under an Emergency Use Authorization (EUA). This EUA will remain  in effect (meaning this test can be used) for the duration of the COVID-19 declaration under Section 564(b)(1) of the Act, 21 U.S.C. section 360bbb-3(b)(1), unless the authorization is terminated  or revoked sooner.     Influenza  A by PCR NEGATIVE NEGATIVE   Influenza B by PCR NEGATIVE NEGATIVE    Comment: (NOTE) The Xpert Xpress SARS-CoV-2/FLU/RSV assay is intended as an aid in  the diagnosis of influenza from Nasopharyngeal swab specimens and  should not be used as a sole basis for treatment. Nasal washings and  aspirates are unacceptable for Xpert Xpress SARS-CoV-2/FLU/RSV  testing.  Fact Sheet for Patients: PinkCheek.be  Fact Sheet for Healthcare Providers: GravelBags.it  This test is not yet approved or cleared by the [place] FDA and  has been authorized for detection and/or diagnosis of SARS-CoV-2 by  FDA under an Emergency Use Authorization (EUA). This EUA will remain  in effect (meaning this test can be used) for the duration of the  Covid-19 declaration under Section 564(b)(1) of the Act, 21  U.S.C. section 360bbb-3(b)(1), unless the authorization is  terminated or revoked. Performed at [facility], [address] [place], [place] [address]   Glucose, capillary     Status: None   Collection Time: 01/02/20 12:26 PM  Result Value Ref Range   Glucose-Capillary 85 70 - 99 mg/dL    Comment: Glucose reference range applies only to samples taken after fasting for at least 8 hours.    Blood Alcohol level:  Lab Results  Component Value Date   ETH <10 01/02/2020   ETH <5 73/22/0254    Metabolic Disorder Labs:  Lab Results  Component Value Date   HGBA1C 5.3 11/22/2017   Lab Results  Component Value Date   PROLACTIN 33.0 (H) 11/22/2017   Lab Results  Component Value Date   CHOL 222 (H) 01/02/2020   TRIG 190 (H) 01/02/2020   HDL 69 01/02/2020   CHOLHDL 3.2 01/02/2020   VLDL 38 01/02/2020   LDLCALC 115 (H) 01/02/2020   LDLCALC 119 (H) 11/22/2017    Current Medications: Current Facility-Administered Medications  Medication Dose Route Frequency Provider Last Rate Last Admin  .  acetaminophen (TYLENOL) tablet 650 mg  650 mg Oral Q6H PRN Clapacs, Madie Reno, MD   650 mg at 01/03/20 0840  . alum & mag hydroxide-simeth (MAALOX/MYLANTA) 200-200-20 MG/5ML suspension 30 mL  30 mL Oral Q4H PRN Clapacs, John T, MD      . aspirin chewable tablet 81 mg  81 mg Oral Q breakfast Clapacs, Madie Reno, MD   81 mg at 01/03/20 2706  . calcium-vitamin D (OSCAL WITH D) 500-200 MG-UNIT per tablet 0.5 tablet  0.5 tablet Oral Q breakfast Salley Scarlet, MD   0.5 tablet at 01/03/20 1224  . carbamazepine (TEGRETOL XR) 12 hr tablet 400 mg  400 mg Oral BID Clapacs, Madie Reno, MD   400 mg at 01/03/20 2376  . divalproex (DEPAKOTE ER) 24 hr tablet 500 mg  500 mg Oral BID Clapacs, Madie Reno, MD   500 mg at 01/03/20 2831  . fluticasone (FLONASE) 50 MCG/ACT nasal spray 2 spray  2 spray Each Nare Daily Clapacs, Madie Reno, MD   2 spray at 01/03/20 [phone]  . folic acid (FOLVITE) tablet 2 mg  2 mg Oral Q breakfast Clapacs, Madie Reno, MD   2 mg at 01/03/20 1607  . gabapentin (NEURONTIN) capsule 300 mg  300 mg Oral BID Clapacs, John T, MD   300 mg at 01/03/20 0832  . hydrALAZINE (APRESOLINE) tablet 25 mg  25 mg Oral Q6H PRN Salley Scarlet, MD   25 mg at 01/03/20 3710  . levothyroxine (SYNTHROID) tablet 75 mcg  75 mcg Oral Q0600 Domingo Cocking,  Hedwig Morton, MD   75 mcg at 01/03/20 [phone]  . magnesium hydroxide (MILK OF MAGNESIA) suspension 30 mL  30 mL Oral Daily PRN Clapacs, John T, MD      . methotrexate (RHEUMATREX) tablet 15 mg  15 mg Oral Q Wed Clapacs, John T, MD   15 mg at 01/03/20 1224  . mirabegron ER (MYRBETRIQ) tablet 25 mg  25 mg Oral Daily Clapacs, Madie Reno, MD   25 mg at 01/03/20 5102  . multivitamin with minerals tablet 1 tablet  1 tablet Oral Q breakfast Clapacs, Madie Reno, MD   1 tablet at 01/03/20 [phone]  . pravastatin (PRAVACHOL) tablet 20 mg  20 mg Oral q1800 Clapacs, John T, MD      . QUEtiapine (SEROQUEL XR) 24 hr tablet 50 mg  50 mg Oral QHS Selina Cooley M, MD      . temazepam (RESTORIL) capsule 7.5 mg  7.5 mg Oral QHS PRN  Salley Scarlet, MD       PTA Medications: Medications Prior to Admission  Medication Sig Dispense Refill Last Dose  . Abatacept (ORENCIA IV) Inject 1 Dose into the vein every 30 (thirty) days.     . Acetaminophen 500 MG capsule Take by mouth.     Marland Kitchen aspirin 81 MG chewable tablet Chew 81 mg by mouth daily with breakfast.     . Calcium Citrate-Vitamin D ([employer] CALCIUM CITRATE +D3 MINI) 200-250 MG-UNIT TABS Take 1 tablet by mouth daily after breakfast.      . carbamazepine (TEGRETOL XR) 200 MG 12 hr tablet Take 2 tablets (400 mg total) by mouth 2 (two) times daily. 360 tablet 1   . cariprazine (VRAYLAR) capsule Take 1 capsule (1.5 mg total) by mouth daily. At bedtime 30 capsule 1   . divalproex (DEPAKOTE ER) 500 MG 24 hr tablet Take 1 tablet twice a day (Patient taking differently: Take 500 mg by mouth 2 (two) times daily. Take 1 tablet twice a day) 180 tablet 1   . eszopiclone (LUNESTA) 1 MG TABS tablet Take 1-2 tablets (1-2 mg total) by mouth at bedtime as needed for sleep. Take immediately before bedtime 15 tablet 1   . fluticasone (FLONASE) 50 MCG/ACT nasal spray      . folic acid (FOLVITE) 1 MG tablet Take 2 mg by mouth daily with breakfast.      . gabapentin (NEURONTIN) 300 MG capsule Take 1 tablet twice a day (Patient taking differently: Take 300 mg by mouth 2 (two) times daily. Take 1 tablet twice a day) 180 capsule 1   . ibandronate (BONIVA) 150 MG tablet      . levothyroxine (SYNTHROID, LEVOTHROID) 75 MCG tablet Take 75 mcg by mouth daily before breakfast.      . methotrexate 2.5 MG tablet Take 15 mg by mouth every Wednesday. WEDNESDAYS AT LUNCH     . Multiple Vitamins-Minerals (MULTIVITAMIN WITH MINERALS) tablet Take 1 tablet by mouth daily with breakfast.      . MYRBETRIQ 25 MG TB24 tablet Take 25 mg by mouth daily.      . pravastatin (PRAVACHOL) 40 MG tablet Take 40 mg by mouth daily.        Musculoskeletal: Strength & Muscle Tone: decreased Gait & Station: unsteady, uses walker to  ambulate Patient leans: Front  Psychiatric Specialty Exam: Physical Exam Vitals and nursing note reviewed.  Constitutional:      Appearance: Normal appearance.  HENT:     Head: Normocephalic and atraumatic.  Right Ear: External ear normal.     Left Ear: External ear normal.     Nose: Nose normal.     Mouth/Throat:     Mouth: Mucous membranes are moist.     Pharynx: Oropharynx is clear.  Eyes:     Conjunctiva/sclera: Conjunctivae normal.     Pupils: Pupils are equal, round, and reactive to light.  Cardiovascular:     Rate and Rhythm: Normal rate.     Pulses: Normal pulses.  Pulmonary:     Effort: Pulmonary effort is normal.     Breath sounds: Normal breath sounds.  Abdominal:     General: Abdomen is flat.     Palpations: Abdomen is soft.  Musculoskeletal:     Cervical back: Normal range of motion and neck supple.     Comments: Deformities in fingers and toes from Rheumatoid arthritis. General weakness.   Skin:    General: Skin is warm and dry.  Neurological:     General: No focal deficit present.     Mental Status: She is alert and oriented to person, place, and time.  Psychiatric:        Attention and Perception: She is inattentive.        Mood and Affect: Mood is elated.        Speech: Speech is rapid and pressured.        Behavior: Behavior is cooperative.        Thought Content: Thought content does not include homicidal or suicidal ideation.        Cognition and Memory: Cognition and memory normal.        Judgment: Judgment is impulsive.     Review of Systems  Constitutional: Positive for activity change. Negative for appetite change.  HENT: Negative for rhinorrhea and sore throat.   Eyes: Negative for photophobia and visual disturbance.  Respiratory: Negative for cough and shortness of breath.   Cardiovascular: Negative for chest pain and palpitations.  Gastrointestinal: Negative for constipation, diarrhea, nausea and vomiting.  Endocrine: Negative for  cold intolerance and heat intolerance.  Genitourinary: Positive for frequency and urgency.  Musculoskeletal: Positive for joint swelling and myalgias.  Skin: Negative for rash and wound.  Allergic/Immunologic: Negative for food allergies and immunocompromised state.  Neurological: Negative for dizziness and seizures.  Hematological: Negative for adenopathy. Does not bruise/bleed easily.  Psychiatric/Behavioral: Positive for sleep disturbance. Negative for hallucinations and suicidal ideas. The patient is nervous/anxious and is hyperactive.     Blood pressure 132/84, pulse 95, temperature 98 F (36.7 C), temperature source Oral, resp. rate 17, height 5' (1.524 m), weight 48.1 kg, SpO2 96 %.Body mass index is 20.7 kg/m.  General Appearance: Fairly Groomed  Eye Contact:  Good  Speech:  Pressured  Volume:  Normal  Mood:  Euphoric  Affect:  Congruent  Thought Process:  Disorganized  Orientation:  Full (Time, Place, and Person)  Thought Content:  Logical  Suicidal Thoughts:  No  Homicidal Thoughts:  No  Memory:  Immediate;   Good Recent;   Good Remote;   Good  Judgement:  Fair  Insight:  Good  Psychomotor Activity:  Normal  Concentration:  Concentration: Poor and Attention Span: Poor  Recall:  [place] of Knowledge:  Good  Language:  Good  Akathisia:  Negative  Handed:  Right  AIMS (if indicated):     Assets:  Communication Skills Desire for Improvement Financial Resources/Insurance Housing Resilience Social Support  ADL's:  Intact  Cognition:  WNL  Sleep:  Number of Hours: 6.5       Treatment Plan Summary: Daily contact with patient to assess and evaluate symptoms and progress in treatment and Medication management Continue voluntary admission to the hospital for acute mania. Continue Depakote and Carbamazipine. Add Seroquel 50 mg QHS scheduled. Will also add Temazepam 7.5 mg QHS PRN if unable to sleep. RBA including tardive dyskinesia, falls risk, and metabolic  profile discussed. Patient agreeable to plan.  Observation Level/Precautions:  15 minute checks  Laboratory:  All labs completed in ED  Psychotherapy:    Medications:    Consultations:    Discharge Concerns:    Estimated LOS:  Other:     Physician Treatment Plan for Primary Diagnosis: Bipolar 1 disorder, manic, moderate ([place]) Long Term Goal(s): Improvement in symptoms so as ready for discharge  Short Term Goals: Ability to identify changes in lifestyle to reduce recurrence of condition will improve, Ability to verbalize feelings will improve, Ability to disclose and discuss suicidal ideas, Ability to demonstrate self-control will improve and Ability to identify and develop effective coping behaviors will improve  Physician Treatment Plan for Secondary Diagnosis: Principal Problem:   Bipolar 1 disorder, manic, moderate (HCC) Active Problems:   H/O gastrointestinal disease   GAD (generalized anxiety disorder)   Insomnia, persistent  Long Term Goal(s): Improvement in symptoms so as ready for discharge  Short Term Goals: Ability to identify changes in lifestyle to reduce recurrence of condition will improve, Ability to verbalize feelings will improve, Ability to disclose and discuss suicidal ideas, Ability to demonstrate self-control will improve and Ability to identify and develop effective coping behaviors will improve  I certify that inpatient services furnished can reasonably be expected to improve the patient's condition.    Salley Scarlet, MD 10/27/20212:26 PM

## 2020-01-03 NOTE — [facility] Group Notes (Signed)
LCSW Group Therapy Note  01/03/2020 4:17 PM  Type of Therapy/Topic:  Group Therapy:  Emotion Regulation  Participation Level:  Did Not Attend   Description of Group:   The purpose of this group is to assist patients in learning to regulate negative emotions and experience positive emotions. Patients will be guided to discuss ways in which they have been vulnerable to their negative emotions. These vulnerabilities will be juxtaposed with experiences of positive emotions or situations, and patients will be challenged to use positive emotions to combat negative ones. Special emphasis will be placed on coping with negative emotions in conflict situations, and patients will process healthy conflict resolution skills.  Therapeutic Goals: 1. Patient will identify two positive emotions or experiences to reflect on in order to balance out negative emotions 2. Patient will label two or more emotions that they find the most difficult to experience 3. Patient will demonstrate positive conflict resolution skills through discussion and/or role plays  Summary of Patient Progress: X  Therapeutic Modalities:   Cognitive Behavioral Therapy Feelings Identification Dialectical Behavioral Therapy  Korey Arroyo R. Guerry Bruin, MSW, [place], [place] 01/03/2020 4:17 PM

## 2020-01-03 NOTE — Progress Notes (Signed)
Recreation Therapy Notes  INPATIENT RECREATION THERAPY ASSESSMENT  Patient Details Name: Stephanie [name] MRN: [id number] DOB: 02-05-1946 Today's Date: 01/03/2020       Information Obtained From: Patient  Able to Participate in Assessment/Interview: Yes  Patient Presentation: Responsive  Reason for Admission (Per Patient): Active Symptoms  Patient Stressors:    Coping Skills:   Talk, Art, Prayer, Read  Leisure Interests (2+):  Psychiatric nurse, Social - Friends, Individual - Reading, Art - Draw  Frequency of Recreation/Participation: Monthly  Awareness of Community Resources:     [employer]:     Current Use:    If no, Barriers?:    Expressed Interest in [employer] Information:    [employer] of Residence:  Insurance underwriter  Patient Main Form of Transportation: Musician  Patient Strengths:  Resilient; Out going; people person  Patient Identified Areas of Improvement:  Get the right meidcation  Patient Goal for Hospitalization:  To get better sleep  Current SI (including self-harm):  No  Current HI:  No  Current AVH: No  Staff Intervention Plan: Group Attendance, Collaborate with Interdisciplinary Treatment Team  Consent to Intern Participation: N/A  Damarion Mendizabal 01/03/2020, 3:09 PM
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
## 2020-01-03 NOTE — Telephone Encounter (Signed)
pt daughter called states she needs to speak with you about the next step with her mother. please call her at [phone] or  [phone]

## 2020-01-03 NOTE — Telephone Encounter (Signed)
Returned call to daughter, left voicemail.

## 2020-01-03 NOTE — Progress Notes (Signed)
Patient alert and oriented x 4, affect is blunted thoughts are organized and coherent speech is pressured she appears talkative, and anxious sometimes intrusive at the nursing station ruminating on the same things over and over . Patient currently denies SI/HI/AVH, she appears restless and she stated " l haven't slept in days"  Patient was assisted to her room, she was given pain medication although she refused Ambien she stated " it makes me sleep walk" she was noted after a while in bed with eyes closed. 15 minutes safety checks maintained will continue to monitor.

## 2020-01-03 NOTE — Progress Notes (Signed)
D: Pt alert and oriented. Pt rates depression 9/10, hopelessness 5/10, and anxiety 10/10. Pt goal: "Improving sleep and stabilizing mood." Pt reports energy level as poor and concentration as being poor. Pt reports sleep last night as being poor. Pt did receive medications for sleep and did not find them helpful. Pt reports experiencing 9/10 Left ear pain, prn meds given. Pt denies experiencing any SI/HI, or AVH at this time.   A: Scheduled medications administered to pt, per MD orders. Support and encouragement provided. Frequent verbal contact made. Routine safety checks conducted q15 minutes.   R: No adverse drug reactions noted. Pt verbally contracts for safety at this time. Pt complaint with medications and treatment plan. Pt interacts well with others on the unit. Pt remains safe at this time. Will continue to monitor.

## 2020-01-04 ENCOUNTER — Telehealth: Payer: Self-pay | Admitting: Psychiatry

## 2020-01-04 DIAGNOSIS — F3112 Bipolar disorder, current episode manic without psychotic features, moderate: Secondary | ICD-10-CM | POA: Diagnosis not present

## 2020-01-04 MED ORDER — FOSFOMYCIN TROMETHAMINE 3 G PO PACK
3.0000 g | PACK | Freq: Once | ORAL | Status: AC
  Administered 2020-01-04: 3 g via ORAL
  Filled 2020-01-04: qty 3

## 2020-01-04 MED ORDER — ACETAMINOPHEN 500 MG PO TABS
1000.0000 mg | ORAL_TABLET | Freq: Three times a day (TID) | ORAL | Status: DC | PRN
  Filled 2020-01-04: qty 2

## 2020-01-04 MED ORDER — NAPROXEN 500 MG PO TABS
500.0000 mg | ORAL_TABLET | Freq: Two times a day (BID) | ORAL | Status: DC | PRN
  Administered 2020-01-04 – 2020-01-11 (×10): 500 mg via ORAL
  Filled 2020-01-04 (×13): qty 1

## 2020-01-04 MED ORDER — ACETAMINOPHEN 500 MG PO TABS
500.0000 mg | ORAL_TABLET | Freq: Three times a day (TID) | ORAL | Status: DC | PRN
  Administered 2020-01-04 – 2020-01-11 (×13): 500 mg via ORAL
  Filled 2020-01-04 (×13): qty 1

## 2020-01-04 NOTE — Progress Notes (Signed)
[facility] MD Progress Note  01/04/2020 1:59 PM MAKYAH LAVIGNE  MRN:  [id number] Subjective:   Ms. Canniff was seen at bedside this morning. She notes that seroquel helped her tremendously last night, and she feels she slept almost 6 hours. Nursing report that she did indeed sleep well over night. This morning she reports a headache and an ear ache. Bilateral ears examined with otoscope, and ear drums clear bilaterally without signs of erythema or infection. Admission labs do suggest UTI, and one time dose fosfomycin given today. She requests Aleve to take with Tylenol for continued arthritic pain. Otherwise, she continues to focus on getting pull-ups, and requests I contact her friend to have bring them in.   Contacted daughter Nira Conn [phone] to discuss medication, and progress her mother is making. She notes that her mother often has a cycle of manai around October or November, and then sometimes again in the spring. At its worst sometimes she has psychotic thoughts of the devil coming to get her, or sometimes has visual hallucinations. She also notes concerns about her mother driving, and feels she should no longer do so. She is hoping that her mother will be able to follow-up with Dr. Shea Evans via video visit or in person.   Contacted friend, Lelon Frohlich, [phone]. No answer. Did leave voicemail to let her know [place] was requesting pull-ups if she had time to drop off. Left nursing station number for call back.   Principal Problem: Bipolar 1 disorder, manic, moderate (HCC) Diagnosis: Principal Problem:   Bipolar 1 disorder, manic, moderate (HCC) Active Problems:   H/O gastrointestinal disease   GAD (generalized anxiety disorder)   Insomnia, persistent  Total Time spent with patient: 1 hour  Past Psychiatric History: Longstanding diagnosis of bipolar disorder. It looks like separate diagnoses of anxiety disorders have been entertained as well. He has had previous hospitalizations but reports  that the last time was many years ago. Denies any past suicide attempts. He is currently on a combination of Tegretol and Depakote as her primary medications. She claims that she has been tried on other medicines but has developed a fear that she would have tardive dyskinesia. In fact she claims that somebody told her she already has tardive dyskinesia which is got her even more nervous.  Past Medical History:  Past Medical History:  Diagnosis Date  . Anginal pain ([place])   . Anxiety   . Bipolar 1 disorder ([place])   . Bipolar disorder ([place])   . Coronary artery disease   . Depression   . Fatigue   . Fibromyalgia   . GERD (gastroesophageal reflux disease)   . Heart disease   . Heart failure ([place])   . HLD (hyperlipidemia)   . HOH (hard of hearing)   . Hypothyroidism   . IBS (irritable bowel syndrome)   . Pelvic pain in female   . Perianal lesion    high grade sil lesion- keratinizing type  . RA (rheumatoid arthritis) ([place])   . Raynaud phenomenon   . Sjogren's disease ([place])   . Sleep apnea   . Vaginal Pap smear, abnormal     Past Surgical History:  Procedure Laterality Date  . CATARACT EXTRACTION W/PHACO Left 02/23/2017   Procedure: CATARACT EXTRACTION PHACO AND INTRAOCULAR LENS PLACEMENT (IOC);  Surgeon: Birder Robson, MD;  Location: [facility] ORS;  Service: Ophthalmology;  Laterality: Left;  [place] 00:35 AP% 9.6 CDE 3.47 Fluid pack lot # [id number]  . CATARACT EXTRACTION W/PHACO Right 03/30/2017  Procedure: CATARACT EXTRACTION PHACO AND INTRAOCULAR LENS PLACEMENT (IOC);  Surgeon: Birder Robson, MD;  Location: [facility] ORS;  Service: Ophthalmology;  Laterality: Right;  [place] 00:32.6 AP% 13.8 CDE 4.51 Fluid Pack Lot # [id number]  . EYE SURGERY    . TONSILLECTOMY     Family History:  Family History  Problem Relation Age of Onset  . Heart failure Father   . Parkinson's disease Mother   . Bipolar disorder Mother   . Depression Sister   . Schizophrenia Paternal Grandmother   .  Diabetes Maternal Grandmother   . Pancreatic cancer Maternal Grandmother   . Cancer Neg Hx   . Heart disease Neg Hx   . Breast cancer Neg Hx    Family Psychiatric  History: Mother with bipolar I disorder, paternal grandfather with schizophrenia. Sister with mental health issues as well. Social History:  Social History   Substance and Sexual Activity  Alcohol Use No  . Alcohol/week: 0.0 standard drinks     Social History   Substance and Sexual Activity  Drug Use No    Social History   Socioeconomic History  . Marital status: Divorced    Spouse name: Not on file  . Number of children: 2  . Years of education: Not on file  . Highest education level: Master's degree (e.g., MA, MS, MEng, MEd, MSW, MBA)  Occupational History    Comment: retired2  Tobacco Use  . Smoking status: Never Smoker  . Smokeless tobacco: Former Network engineer  . Vaping Use: Never used  Substance and Sexual Activity  . Alcohol use: No    Alcohol/week: 0.0 standard drinks  . Drug use: No  . Sexual activity: Never    Birth control/protection: Post-menopausal  Other Topics Concern  . Not on file  Social History Narrative  . Not on file   Social Determinants of Health   Financial Resource Strain:   . Difficulty of Paying Living Expenses: Not on file  Food Insecurity:   . Worried About Charity fundraiser in the Last Year: Not on file  . Ran Out of Food in the Last Year: Not on file  Transportation Needs:   . Lack of Transportation (Medical): Not on file  . Lack of Transportation (Non-Medical): Not on file  Physical Activity:   . Days of Exercise per Week: Not on file  . Minutes of Exercise per Session: Not on file  Stress:   . Feeling of Stress : Not on file  Social Connections:   . Frequency of Communication with Friends and Family: Not on file  . Frequency of Social Gatherings with Friends and Family: Not on file  . Attends Religious Services: Not on file  . Active Member of Clubs or  Organizations: Not on file  . Attends Archivist Meetings: Not on file  . Marital Status: Not on file   Additional Social History:                         Sleep: Fair  Appetite:  Fair  Current Medications: Current Facility-Administered Medications  Medication Dose Route Frequency Provider Last Rate Last Admin  . acetaminophen (TYLENOL) tablet 650 mg  650 mg Oral Q6H PRN Clapacs, Madie Reno, MD   650 mg at 01/04/20 1041  . alum & mag hydroxide-simeth (MAALOX/MYLANTA) 200-200-20 MG/5ML suspension 30 mL  30 mL Oral Q4H PRN Clapacs, John T, MD      . aspirin chewable tablet 81  mg  81 mg Oral Q breakfast Clapacs, Madie Reno, MD   81 mg at 01/04/20 2025  . calcium-vitamin D (OSCAL WITH D) 500-200 MG-UNIT per tablet 0.5 tablet  0.5 tablet Oral Q breakfast Salley Scarlet, MD   0.5 tablet at 01/04/20 [phone]  . carbamazepine (TEGRETOL XR) 12 hr tablet 400 mg  400 mg Oral BID Clapacs, Madie Reno, MD   400 mg at 01/04/20 6237  . divalproex (DEPAKOTE ER) 24 hr tablet 500 mg  500 mg Oral BID Clapacs, Madie Reno, MD   500 mg at 01/04/20 6283  . fluticasone (FLONASE) 50 MCG/ACT nasal spray 2 spray  2 spray Each Nare Daily Clapacs, Madie Reno, MD   2 spray at 01/04/20 0834  . folic acid (FOLVITE) tablet 2 mg  2 mg Oral Q breakfast Clapacs, Madie Reno, MD   2 mg at 01/04/20 1517  . gabapentin (NEURONTIN) capsule 300 mg  300 mg Oral BID Clapacs, John T, MD   300 mg at 01/04/20 0832  . hydrALAZINE (APRESOLINE) tablet 25 mg  25 mg Oral Q6H PRN Salley Scarlet, MD   25 mg at 01/03/20 6160  . levothyroxine (SYNTHROID) tablet 75 mcg  75 mcg Oral Q0600 Salley Scarlet, MD   75 mcg at 01/04/20 0630  . magnesium hydroxide (MILK OF MAGNESIA) suspension 30 mL  30 mL Oral Daily PRN Clapacs, John T, MD      . methotrexate (RHEUMATREX) tablet 15 mg  15 mg Oral Q Wed Clapacs, John T, MD   15 mg at 01/03/20 1224  . mirabegron ER (MYRBETRIQ) tablet 25 mg  25 mg Oral Daily Clapacs, Madie Reno, MD   25 mg at 01/04/20 0835  .  multivitamin with minerals tablet 1 tablet  1 tablet Oral Q breakfast Clapacs, Madie Reno, MD   1 tablet at 01/04/20 [phone]  . pravastatin (PRAVACHOL) tablet 20 mg  20 mg Oral q1800 Clapacs, Madie Reno, MD   20 mg at 01/03/20 1840  . QUEtiapine (SEROQUEL XR) 24 hr tablet 50 mg  50 mg Oral QHS Salley Scarlet, MD   50 mg at 01/03/20 2100  . temazepam (RESTORIL) capsule 7.5 mg  7.5 mg Oral QHS PRN Salley Scarlet, MD        Lab Results: No results found for this or any previous visit (from the past 48 hour(s)).  Blood Alcohol level:  Lab Results  Component Value Date   ETH <10 01/02/2020   ETH <5 09/02/9483    Metabolic Disorder Labs: Lab Results  Component Value Date   HGBA1C 5.3 11/22/2017   Lab Results  Component Value Date   PROLACTIN 33.0 (H) 11/22/2017   Lab Results  Component Value Date   CHOL 222 (H) 01/02/2020   TRIG 190 (H) 01/02/2020   HDL 69 01/02/2020   CHOLHDL 3.2 01/02/2020   VLDL 38 01/02/2020   LDLCALC 115 (H) 01/02/2020   LDLCALC 119 (H) 11/22/2017    Physical Findings: AIMS: Facial and Oral Movements Muscles of Facial Expression: None, normal Lips and Perioral Area: None, normal Jaw: None, normal Tongue: None, normal,Extremity Movements Upper (arms, wrists, hands, fingers): None, normal Lower (legs, knees, ankles, toes): None, normal, Trunk Movements Neck, shoulders, hips: None, normal, Overall Severity Severity of abnormal movements (highest score from questions above): None, normal Incapacitation due to abnormal movements: None, normal Patient's awareness of abnormal movements (rate only patient's report): No Awareness, Dental Status Current problems with teeth and/or dentures?: No Does patient  usually wear dentures?: No  CIWA:  CIWA-Ar Total: 1 COWS:  COWS Total Score: 2  Musculoskeletal: Strength & Muscle Tone: decreased Gait & Station: unsteady, ambulates with walker Patient leans: Front  Psychiatric Specialty Exam: Physical Exam Vitals and  nursing note reviewed.  Constitutional:      Appearance: Normal appearance.  HENT:     Head: Normocephalic and atraumatic.     Right Ear: Tympanic membrane, ear canal and external ear normal.     Left Ear: Tympanic membrane, ear canal and external ear normal.     Nose: Nose normal.     Mouth/Throat:     Mouth: Mucous membranes are moist.     Pharynx: Oropharynx is clear.  Eyes:     Extraocular Movements: Extraocular movements intact.     Conjunctiva/sclera: Conjunctivae normal.     Pupils: Pupils are equal, round, and reactive to light.  Cardiovascular:     Rate and Rhythm: Normal rate and regular rhythm.     Pulses: Normal pulses.     Heart sounds: Normal heart sounds.  Pulmonary:     Effort: Pulmonary effort is normal.     Breath sounds: Normal breath sounds.  Abdominal:     General: Abdomen is flat.     Palpations: Abdomen is soft.  Musculoskeletal:        General: Normal range of motion.     Cervical back: Normal range of motion and neck supple.     Comments: Rheumatoid arthritic changes to bilateral fingers and toes  Skin:    General: Skin is warm and dry.  Neurological:     General: No focal deficit present.     Mental Status: She is alert and oriented to person, place, and time.  Psychiatric:        Attention and Perception: She is inattentive.        Mood and Affect: Mood is elated. Affect is labile.        Speech: Speech is rapid and pressured.        Behavior: Behavior is cooperative.        Thought Content: Thought content does not include homicidal or suicidal ideation.        Cognition and Memory: Memory normal. Cognition is impaired.     Review of Systems  Constitutional: Negative for appetite change and fatigue.  HENT: Positive for ear pain. Negative for hearing loss.   Eyes: Negative for photophobia and visual disturbance.  Respiratory: Negative for cough and shortness of breath.   Cardiovascular: Negative for chest pain and palpitations.   Gastrointestinal: Negative for constipation, diarrhea, nausea and vomiting.  Endocrine: Negative for cold intolerance and heat intolerance.  Genitourinary: Positive for frequency and urgency. Negative for dysuria.  Musculoskeletal: Positive for arthralgias and joint swelling.  Skin: Negative for rash and wound.  Allergic/Immunologic: Negative for food allergies and immunocompromised state.  Neurological: Positive for headaches. Negative for dizziness.  Hematological: Negative for adenopathy. Does not bruise/bleed easily.  Psychiatric/Behavioral: Negative for hallucinations and suicidal ideas.    Blood pressure (!) 137/91, pulse 81, temperature 98.4 F (36.9 C), temperature source Oral, resp. rate 18, height 5' (1.524 m), weight 48.1 kg, SpO2 97 %.Body mass index is 20.7 kg/m.  General Appearance: Well Groomed  Eye Contact:  Good  Speech:  Pressured  Volume:  Increased  Mood:  Euphoric  Affect:  Congruent  Thought Process:  Disorganized  Orientation:  Full (Time, Place, and Person)  Thought Content:  Tangential  Suicidal Thoughts:  No  Homicidal Thoughts:  No  Memory:  Immediate;   Fair Recent;   Fair Remote;   Fair  Judgement:  Fair  Insight:  Good  Psychomotor Activity:  Normal  Concentration:  Concentration: Fair and Attention Span: Fair  Recall:  [employer] of Knowledge:  Good  Language:  Good  Akathisia:  Negative  Handed:  Right  AIMS (if indicated):     Assets:  Communication Skills Desire for Improvement Financial Resources/Insurance Housing Physical Health Resilience Social Support  ADL's:  Intact  Cognition:  WNL  Sleep:  Number of Hours: 7.5     Treatment Plan Summary: Daily contact with patient to assess and evaluate symptoms and progress in treatment, Medication management and Plan continue medications as above. One time dose of fosfomycin for UTI.   Salley Scarlet, MD 01/04/2020, 1:59 PM

## 2020-01-04 NOTE — Progress Notes (Signed)
Recreation Therapy Notes   Date: 01/04/2020  Time: 9:30 am   Location: Craft room  Behavioral response: Appropriate  Intervention Topic: Communication   Discussion/Intervention:  Group content today was focused on communication. The group defined communication and ways to communicate with others. Individuals stated reason why communication is important and some reasons to communicate with others. Patients expressed if they thought they were good at communicating with others and ways they could improve their communication skills. The group identified important parts of communication and some experiences they have had in the past with communication. The group participated in the intervention "Words in a Bag", where they had a chance to test out their communication skills and identify ways to improve their communication techniques.   Clinical Observations/Feedback: Patient came to group and defined communication as a way to connect with people. She stated that she likes to communicate face to face and text others when communicating. Participant explained that communication is important to build relationships. Individual was social with peers and staff while participating in the intervention.  Ladarrian Asencio LRT/CTRS         Victor Granados 01/04/2020 11:59 AM

## 2020-01-04 NOTE — Progress Notes (Signed)
BRIEF PHARMACY NOTE   This patient attended and participated in Medication Management Group counseling led by [facility] staff pharmacist.  This interactive class reviews basic information about prescription medications and education on personal responsibility in medication management.  The class also includes general knowledge of 3 main classes of behavioral medications, including antipsychotics, antidepressants, and mood stabilizers.     Patient behavior was appropriate for group setting.   Educational materials sourced from:  "Medication Do's and Don'ts" from [email or web address]   "Mental Health Medications" from [email or web address] [place], PharmD, BCPS Clinical Pharmacist 01/04/2020 3:27 PM

## 2020-01-04 NOTE — Plan of Care (Signed)
Patient is pleasant and appropriate with staff & peers.Patient states " I still have some anxiety about the transition to going home with home health." Patient denies SI,HI and AVH.Compliant with medications.Patient stated that the pharmacy group was really helpful.Appetite and energy level good.Patient ambulates with walker.Support and encouragement given.

## 2020-01-04 NOTE — Progress Notes (Signed)
Patient alert and oriented x 4, affect is blunted thoughts are organized and coherent speech is less pressured, she is calm and appears less anxious interacting appropriately with peers and staff. Patient currently denies SI/HI/AVH, she was given scheduled medication, no distress noted, 15 minutes safety checks maintained will continue to monitor.

## 2020-01-04 NOTE — BHH Suicide Risk Assessment (Signed)
[place] INPATIENT:  Family/Significant Other Suicide Prevention Education  Suicide Prevention Education:  Education Completed; Nilda Simmer, daughter, [phone] has been identified by the patient as the family member/significant other with whom the patient will be residing, and identified as the person(s) who will aid the patient in the event of a mental health crisis (suicidal ideations/suicide attempt).  With written consent from the patient, the family member/significant other has been provided the following suicide prevention education, prior to the and/or following the discharge of the patient.  The suicide prevention education provided includes the following:  Suicide risk factors  Suicide prevention and interventions  National Suicide Hotline telephone number  [facility] assessment telephone number  [facility] Emergency Assistance [place] and/or Residential Mobile Crisis Unit telephone number  Request made of family/significant other to:  Remove weapons (e.g., guns, rifles, knives), all items previously/currently identified as safety concern.    Remove drugs/medications (over-the-counter, prescriptions, illicit drugs), all items previously/currently identified as a safety concern.  The family member/significant other verbalizes understanding of the suicide prevention education information provided.  The family member/significant other agrees to remove the items of safety concern listed above.  Daughter reports "she did not express any psychosis or suicidal thoughts to me".  She reports that the patient "is clearly manic and not sleeping".  She reports that patient was reporting that her medication were not effective. Daughter reports that she is not sure "what tipped it over" for the patient to be admitted to the unit.  She reports concerns for the patient driving her car and family plans to address that with pt. She does not think pt is a danger to  self or others outside of driving.  She reports that patient does not have access to weapons.   Rozann Lesches 01/04/2020, 11:24 AM

## 2020-01-04 NOTE — BHH Counselor (Signed)
CSW reviewed with patient that a referral for home health could be made if patient wished.  CSW informed that this was different that CSW originally thought.  CSW advised the patient to discuss with her sister and children the list of possible home health providers, once reviewed and family has made a decision, CSW could refer the patient.  Patient declined to discuss with her family stating that they were busy, she requested to review on her own when not in the hospital and she will follow up on her own or have her outpatient provider assist.  CSW provided the patient with the following list.   1. [place] [phone] Quality rating Patient survey rating 2. [place] [phone] Quality rating Patient survey rating 3. [place] [phone] Quality rating Patient survey rating 4. [place] [phone] Quality rating Patient survey rating 5. [place] Age [phone] Quality rating Patient survey rating 11 6. [place] [phone] Quality rating Patient survey rating 7. [facility] [phone] Quality rating Patient survey rating 8. [place] [phone] Quality rating Patient survey rating 9. [place] [phone] Quality rating Patient survey rating 10. [place] [phone] Quality rating Patient survey rating 11. [facility] [place] [phone] Quality rating Patient survey rating 12. [facility] [place] [phone]  Assunta Curtis, MSW, LCSW 01/04/2020 3:08 PM

## 2020-01-04 NOTE — BHH Group Notes (Signed)
LCSW Group Therapy Note  01/04/2020 2:58 PM  Type of Therapy/Topic:  Group Therapy:  Balance in Life  Participation Level:  Active  Description of Group:    This group will address the concept of balance and how it feels and looks when one is unbalanced. Patients will be encouraged to process areas in their lives that are out of balance and identify reasons for remaining unbalanced. Facilitators will guide patients in utilizing problem-solving interventions to address and correct the stressor making their life unbalanced. Understanding and applying boundaries will be explored and addressed for obtaining and maintaining a balanced life. Patients will be encouraged to explore ways to assertively make their unbalanced needs known to significant others in their lives, using other group members and facilitator for support and feedback.  Therapeutic Goals: 1. Patient will identify two or more emotions or situations they have that consume much of in their lives. 2. Patient will identify signs/triggers that life has become out of balance:  3. Patient will identify two ways to set boundaries in order to achieve balance in their lives:  4. Patient will demonstrate ability to communicate their needs through discussion and/or role plays  Summary of Patient Progress: Patient was present in group.  Patient was attentive and supportive of others.  Patient shared that she engages in exercise, Tai Chi, taking long walks, to maintain her fitness.  She reports that she feels that she is doing well balancing all the aspects of herself.    Therapeutic Modalities:   Cognitive Behavioral Therapy Solution-Focused Therapy Assertiveness Training  Assunta Curtis MSW, [place] 01/04/2020 2:58 PM

## 2020-01-04 NOTE — Telephone Encounter (Signed)
Since I could not communicate with daughter Ms. Stephanie [name] yesterday, tried contacting her back again today.  Daughter raised concern as to why her mother was admitted in the first place.  Attempted to educate her about the fact that her mother had worsening mood symptoms, sleep problems and confusion for a few days that I have advised her to go to the nearest emergency department to be evaluated.  Although I did recommend inpatient mental health admission given her decompensation discussed that it is completely the decision of the providers who evaluate her in the emergency department who decides whether she requires inpatient admission or not.  However discussed with daughter that it is very difficult to evaluate a patient's reason for decompensation just by talking to her on phone and that sometimes you need to rule out medical causes, get labs, urine analysis and imaging to decide what could be contributing to those symptoms and that is another reason she was referred to the emergency department.  Daughter is concerned that her mother needs a face-to-face evaluation and not just a phone call.  Discussed that she was offered a virtual video visit several times in the past however she opted for a phone call since she was having some difficulty with connection.  Daughter wants to assist her mother to start a MyChart account and also to start video visits with Probation officer.  She would like a call from someone to assist her to set up an account.  I have communicated with Stephanie [name] CMA to contact daughter-Stephanie [name] - To assist with her my chart set up questions.
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
## 2020-01-05 ENCOUNTER — Telehealth: Payer: Self-pay

## 2020-01-05 DIAGNOSIS — F3112 Bipolar disorder, current episode manic without psychotic features, moderate: Secondary | ICD-10-CM | POA: Diagnosis not present

## 2020-01-05 LAB — URINE CULTURE: Culture: 80000 — AB

## 2020-01-05 MED ORDER — PHENYLEPHRINE-MINERAL OIL-PET 0.25-14-74.9 % RE OINT
1.0000 "application " | TOPICAL_OINTMENT | Freq: Two times a day (BID) | RECTAL | Status: DC | PRN
  Administered 2020-01-05: 1 via RECTAL
  Filled 2020-01-05: qty 57

## 2020-01-05 MED ORDER — MIRABEGRON ER 50 MG PO TB24
50.0000 mg | ORAL_TABLET | Freq: Every day | ORAL | Status: DC
  Administered 2020-01-06 – 2020-01-11 (×6): 50 mg via ORAL
  Filled 2020-01-05 (×6): qty 1

## 2020-01-05 MED ORDER — HYDRALAZINE HCL 25 MG PO TABS
25.0000 mg | ORAL_TABLET | Freq: Once | ORAL | Status: AC
  Administered 2020-01-08: 25 mg via ORAL
  Filled 2020-01-05: qty 1

## 2020-01-05 NOTE — Progress Notes (Signed)
Pt is alert and oriented to person, place, time and situation. Pt is clam, cooperative, denies suicidal and homicidal ideation, denies hallucinations. Pt is cooperative, medication complaint, mood is irritable at times. Pt reports she did not sleep well despite taking medications for insomnia, pt's MD/pychiatrist on unit is well. Pt requires staff assist with ADLs such as flushing her toilet due to her limitation in her hands' range of motion. Pt uses walker without assist, with walker pt's gait is steady. Pt is out for breakfast, social with staff and peers. Pt denies suicidal and homicidal ideation, denies hallucinations, denies feelings of depression, reports feelings of anxiety rates it 4/10 on a 0-10 scale, 10 being worst. Will continue to monitor pt per Q15 minute face checks, and monitor for safety and progress.

## 2020-01-05 NOTE — Progress Notes (Signed)
Patient alert and oriented x 4 affect is blunted thoughts are organized and coherent, she is appropriate with peers and staff, her mood is irritable and angry she stated " I am upset because they have misplaced my stuff in the emergency room" writer called the charge nurse in the ED she stated " her  pull ups and clothes are not in the ED"  upon arrival days ago on the unit she told staff she was missing adult pullups and jewelry, staff checked it and noted she didn't bring down with her these belongings.  Patient currently denies SI/HI/AVH, she was given scheduled medication,  15 minutes safety checks maintained will continue to monitor closely.

## 2020-01-05 NOTE — Progress Notes (Signed)
[facility] MD Progress Note  01/05/2020 12:47 PM Stephanie [name]  MRN:  [id number] Subjective:   Ms. Topping was seen at bedside this morning. She notes that she had some difficulty sleeping overnight due to loud noise on the unit overnight. She took her Temazepam PRN at roughly 4AM.  She received Fosfomycin yesterday for UTI on admission. Today, her mood remains somewhat irritable and labile. She remains tangential, but speech is less pressured than admission. She is tolerating Seroquel at night without signs of Tardive dyskinesia. She requests to increase her dose of Myrbetriq to 50 mg as this is what she took as an outpatient. Will do this today. She also mentions interest in home health and to see Dr. Shea Evans again when she leaves the hospital. No other questions or concerns today.    Principal Problem: Bipolar 1 disorder, manic, moderate (HCC) Diagnosis: Principal Problem:   Bipolar 1 disorder, manic, moderate (HCC) Active Problems:   H/O gastrointestinal disease   GAD (generalized anxiety disorder)   Insomnia, persistent  Total Time spent with patient: 1 hour  Past Psychiatric History: Longstanding diagnosis of bipolar disorder. It looks like separate diagnoses of anxiety disorders have been entertained as well. He has had previous hospitalizations but reports that the last time was many years ago. Denies any past suicide attempts. He is currently on a combination of Tegretol and Depakote as her primary medications. She claims that she has been tried on other medicines but has developed a fear that she would have tardive dyskinesia. In fact she claims that somebody told her she already has tardive dyskinesia which is got her even more nervous.  Past Medical History:  Past Medical History:  Diagnosis Date  . Anginal pain ([place])   . Anxiety   . Bipolar 1 disorder ([place])   . Bipolar disorder ([place])   . Coronary artery disease   . Depression   . Fatigue   . Fibromyalgia   . GERD  (gastroesophageal reflux disease)   . Heart disease   . Heart failure ([place])   . HLD (hyperlipidemia)   . HOH (hard of hearing)   . Hypothyroidism   . IBS (irritable bowel syndrome)   . Pelvic pain in female   . Perianal lesion    high grade sil lesion- keratinizing type  . RA (rheumatoid arthritis) ([place])   . Raynaud phenomenon   . Sjogren's disease ([place])   . Sleep apnea   . Vaginal Pap smear, abnormal     Past Surgical History:  Procedure Laterality Date  . CATARACT EXTRACTION W/PHACO Left 02/23/2017   Procedure: CATARACT EXTRACTION PHACO AND INTRAOCULAR LENS PLACEMENT (IOC);  Surgeon: Birder Robson, MD;  Location: [facility] ORS;  Service: Ophthalmology;  Laterality: Left;  [place] 00:35 AP% 9.6 CDE 3.47 Fluid pack lot # [id number]  . CATARACT EXTRACTION W/PHACO Right 03/30/2017   Procedure: CATARACT EXTRACTION PHACO AND INTRAOCULAR LENS PLACEMENT ([place]);  Surgeon: Birder Robson, MD;  Location: [facility] ORS;  Service: Ophthalmology;  Laterality: Right;  [place] 00:32.6 AP% 13.8 CDE 4.51 Fluid Pack Lot # [id number]  . EYE SURGERY    . TONSILLECTOMY     Family History:  Family History  Problem Relation Age of Onset  . Heart failure Father   . Parkinson's disease Mother   . Bipolar disorder Mother   . Depression Sister   . Schizophrenia Paternal Grandmother   . Diabetes Maternal Grandmother   . Pancreatic cancer Maternal Grandmother   . Cancer Neg Hx   . Heart  disease Neg Hx   . Breast cancer Neg Hx    Family Psychiatric  History: Mother with bipolar I disorder, paternal grandfather with schizophrenia. Sister with mental health issues as well. Social History:  Social History   Substance and Sexual Activity  Alcohol Use No  . Alcohol/week: 0.0 standard drinks     Social History   Substance and Sexual Activity  Drug Use No    Social History   Socioeconomic History  . Marital status: Divorced    Spouse name: Not on file  . Number of children: 2  . Years of education: Not on  file  . Highest education level: Master's degree (e.g., MA, MS, MEng, MEd, MSW, MBA)  Occupational History    Comment: retired2  Tobacco Use  . Smoking status: Never Smoker  . Smokeless tobacco: Former Network engineer  . Vaping Use: Never used  Substance and Sexual Activity  . Alcohol use: No    Alcohol/week: 0.0 standard drinks  . Drug use: No  . Sexual activity: Never    Birth control/protection: Post-menopausal  Other Topics Concern  . Not on file  Social History Narrative  . Not on file   Social Determinants of Health   Financial Resource Strain:   . Difficulty of Paying Living Expenses: Not on file  Food Insecurity:   . Worried About Charity fundraiser in the Last Year: Not on file  . Ran Out of Food in the Last Year: Not on file  Transportation Needs:   . Lack of Transportation (Medical): Not on file  . Lack of Transportation (Non-Medical): Not on file  Physical Activity:   . Days of Exercise per Week: Not on file  . Minutes of Exercise per Session: Not on file  Stress:   . Feeling of Stress : Not on file  Social Connections:   . Frequency of Communication with Friends and Family: Not on file  . Frequency of Social Gatherings with Friends and Family: Not on file  . Attends Religious Services: Not on file  . Active Member of Clubs or Organizations: Not on file  . Attends Archivist Meetings: Not on file  . Marital Status: Not on file   Additional Social History:                         Sleep: Fair  Appetite:  Fair  Current Medications: Current Facility-Administered Medications  Medication Dose Route Frequency Provider Last Rate Last Admin  . acetaminophen (TYLENOL) tablet 500 mg  500 mg Oral Q8H PRN Salley Scarlet, MD   500 mg at 01/05/20 0320  . alum & mag hydroxide-simeth (MAALOX/MYLANTA) 200-200-20 MG/5ML suspension 30 mL  30 mL Oral Q4H PRN Clapacs, John T, MD      . aspirin chewable tablet 81 mg  81 mg Oral Q breakfast Clapacs,  Madie Reno, MD   81 mg at 01/05/20 0836  . calcium-vitamin D (OSCAL WITH D) 500-200 MG-UNIT per tablet 0.5 tablet  0.5 tablet Oral Q breakfast Salley Scarlet, MD   0.5 tablet at 01/05/20 0839  . carbamazepine (TEGRETOL XR) 12 hr tablet 400 mg  400 mg Oral BID Clapacs, Madie Reno, MD   400 mg at 01/05/20 6063  . divalproex (DEPAKOTE ER) 24 hr tablet 500 mg  500 mg Oral BID Clapacs, Madie Reno, MD   500 mg at 01/05/20 0836  . fluticasone (FLONASE) 50 MCG/ACT nasal spray 2 spray  2 spray Each Nare Daily Clapacs, Madie Reno, MD   2 spray at 01/05/20 [phone]  . folic acid (FOLVITE) tablet 2 mg  2 mg Oral Q breakfast Clapacs, Madie Reno, MD   2 mg at 01/05/20 0836  . gabapentin (NEURONTIN) capsule 300 mg  300 mg Oral BID Clapacs, John T, MD   300 mg at 01/05/20 0836  . hydrALAZINE (APRESOLINE) tablet 25 mg  25 mg Oral Q6H PRN Salley Scarlet, MD   25 mg at 01/03/20 3299  . hydrALAZINE (APRESOLINE) tablet 25 mg  25 mg Oral Once Caroline Sauger, NP      . levothyroxine (SYNTHROID) tablet 75 mcg  75 mcg Oral Q0600 Salley Scarlet, MD   75 mcg at 01/05/20 [phone]  . magnesium hydroxide (MILK OF MAGNESIA) suspension 30 mL  30 mL Oral Daily PRN Clapacs, John T, MD      . methotrexate (RHEUMATREX) tablet 15 mg  15 mg Oral Q Wed Clapacs, John T, MD   15 mg at 01/03/20 1224  . [START ON 01/06/2020] mirabegron ER (MYRBETRIQ) tablet 50 mg  50 mg Oral Daily Salley Scarlet, MD      . multivitamin with minerals tablet 1 tablet  1 tablet Oral Q breakfast Clapacs, Madie Reno, MD   1 tablet at 01/05/20 0836  . naproxen (NAPROSYN) tablet 500 mg  500 mg Oral BID PRN Salley Scarlet, MD   500 mg at 01/05/20 0320  . pravastatin (PRAVACHOL) tablet 20 mg  20 mg Oral q1800 Clapacs, Madie Reno, MD   20 mg at 01/04/20 1708  . QUEtiapine (SEROQUEL XR) 24 hr tablet 50 mg  50 mg Oral QHS Salley Scarlet, MD   50 mg at 01/04/20 2141  . temazepam (RESTORIL) capsule 7.5 mg  7.5 mg Oral QHS PRN Salley Scarlet, MD   7.5 mg at 01/05/20 0441    Lab  Results: No results found for this or any previous visit (from the past 48 hour(s)).  Blood Alcohol level:  Lab Results  Component Value Date   ETH <10 01/02/2020   ETH <5 83/41/9622    Metabolic Disorder Labs: Lab Results  Component Value Date   HGBA1C 5.3 11/22/2017   Lab Results  Component Value Date   PROLACTIN 33.0 (H) 11/22/2017   Lab Results  Component Value Date   CHOL 222 (H) 01/02/2020   TRIG 190 (H) 01/02/2020   HDL 69 01/02/2020   CHOLHDL 3.2 01/02/2020   VLDL 38 01/02/2020   LDLCALC 115 (H) 01/02/2020   LDLCALC 119 (H) 11/22/2017    Physical Findings: AIMS: Facial and Oral Movements Muscles of Facial Expression: None, normal Lips and Perioral Area: None, normal Jaw: None, normal Tongue: None, normal,Extremity Movements Upper (arms, wrists, hands, fingers): None, normal Lower (legs, knees, ankles, toes): None, normal, Trunk Movements Neck, shoulders, hips: None, normal, Overall Severity Severity of abnormal movements (highest score from questions above): None, normal Incapacitation due to abnormal movements: None, normal Patient's awareness of abnormal movements (rate only patient's report): No Awareness, Dental Status Current problems with teeth and/or dentures?: No Does patient usually wear dentures?: No  CIWA:  CIWA-Ar Total: 1 COWS:  COWS Total Score: 2  Musculoskeletal: Strength & Muscle Tone: decreased Gait & Station: unsteady, ambulates with walker Patient leans: Front  Psychiatric Specialty Exam: Physical Exam Vitals and nursing note reviewed.  Constitutional:      Appearance: Normal appearance.  HENT:     Head: Normocephalic and atraumatic.  Right Ear: Tympanic membrane, ear canal and external ear normal.     Left Ear: Tympanic membrane, ear canal and external ear normal.     Nose: Nose normal.     Mouth/Throat:     Mouth: Mucous membranes are moist.     Pharynx: Oropharynx is clear.  Eyes:     Extraocular Movements: Extraocular  movements intact.     Conjunctiva/sclera: Conjunctivae normal.     Pupils: Pupils are equal, round, and reactive to light.  Cardiovascular:     Rate and Rhythm: Normal rate and regular rhythm.     Pulses: Normal pulses.     Heart sounds: Normal heart sounds.  Pulmonary:     Effort: Pulmonary effort is normal.     Breath sounds: Normal breath sounds.  Abdominal:     General: Abdomen is flat.     Palpations: Abdomen is soft.  Musculoskeletal:        General: Normal range of motion.     Cervical back: Normal range of motion and neck supple.     Comments: Rheumatoid arthritic changes to bilateral fingers and toes  Skin:    General: Skin is warm and dry.  Neurological:     General: No focal deficit present.     Mental Status: She is alert and oriented to person, place, and time.  Psychiatric:        Attention and Perception: She is inattentive.        Mood and Affect: Affect is labile.        Speech: Speech is rapid and pressured.        Behavior: Behavior is cooperative.        Thought Content: Thought content does not include homicidal or suicidal ideation.        Cognition and Memory: Memory normal. Cognition is impaired.     Review of Systems  Constitutional: Negative for appetite change and fatigue.  HENT: Positive for ear pain. Negative for hearing loss.   Eyes: Negative for photophobia and visual disturbance.  Respiratory: Negative for cough and shortness of breath.   Cardiovascular: Negative for chest pain and palpitations.  Gastrointestinal: Negative for constipation, diarrhea, nausea and vomiting.  Endocrine: Negative for cold intolerance and heat intolerance.  Genitourinary: Positive for frequency and urgency. Negative for dysuria.  Musculoskeletal: Positive for arthralgias and joint swelling.  Skin: Negative for rash and wound.  Allergic/Immunologic: Negative for food allergies and immunocompromised state.  Neurological: Positive for headaches. Negative for  dizziness.  Hematological: Negative for adenopathy. Does not bruise/bleed easily.  Psychiatric/Behavioral: Negative for hallucinations and suicidal ideas.    Blood pressure 123/76, pulse 72, temperature 97.7 F (36.5 C), temperature source Oral, resp. rate 18, height 5' (1.524 m), weight 48.1 kg, SpO2 97 %.Body mass index is 20.7 kg/m.  General Appearance: Well Groomed  Eye Contact:  Good  Speech:  Pressured, but less so than on admission  Volume:  Increased  Mood:  Euphoric  Affect:  Congruent  Thought Process:  Disorganized  Orientation:  Full (Time, Place, and Person)  Thought Content:  Tangential  Suicidal Thoughts:  No  Homicidal Thoughts:  No  Memory:  Immediate;   Fair Recent;   Fair Remote;   Fair  Judgement:  Fair  Insight:  Good  Psychomotor Activity:  Normal  Concentration:  Concentration: Fair and Attention Span: Fair  Recall:  [employer] of Knowledge:  Good  Language:  Good  Akathisia:  Negative  Handed:  Right  AIMS (if indicated):     Assets:  Communication Skills Desire for Improvement Financial Resources/Insurance Housing Physical Health Resilience Social Support  ADL's:  Intact  Cognition:  WNL  Sleep:  Number of Hours: 7.5     Treatment Plan Summary: Daily contact with patient to assess and evaluate symptoms and progress in treatment, Medication management and Plan Increase Myrbetriq to 50 mg daily, continue all other medications as above. One time dose of fosfomycin for UTI completed 01/04/20.   Salley Scarlet, MD 01/05/2020, 12:47 PM

## 2020-01-05 NOTE — BHH Group Notes (Signed)
LCSW Group Therapy Note  01/05/2020 3:49 PM  Type of Therapy and Topic:  Group Therapy:  Feelings around Relapse and Recovery  Participation Level:  Active   Description of Group:    Patients in this group will discuss emotions they experience before and after a relapse. They will process how experiencing these feelings, or avoidance of experiencing them, relates to having a relapse. Facilitator will guide patients to explore emotions they have related to recovery. Patients will be encouraged to process which emotions are more powerful. They will be guided to discuss the emotional reaction significant others in their lives may have to their relapse or recovery. Patients will be assisted in exploring ways to respond to the emotions of others without this contributing to a relapse.  Therapeutic Goals: 1. Patient will identify two or more emotions that lead to a relapse for them 2. Patient will identify two emotions that result when they relapse 3. Patient will identify two emotions related to recovery 4. Patient will demonstrate ability to communicate their needs through discussion and/or role plays   Summary of Patient Progress: Pt was active in the group discussion. She was able to identify stress as an emotion that results when in relapse. She was on topic throughout the discussion and gave appropriate feedback.    Therapeutic Modalities:   Cognitive Behavioral Therapy Solution-Focused Therapy Assertiveness Training Relapse Prevention Therapy   Hedy Camara R. Guerry Bruin, MSW, [place], [place] 01/05/2020 3:49 PM

## 2020-01-05 NOTE — Telephone Encounter (Signed)
pt called left a message that according to her insurance you will need to call them for authorization for admitions and also for her to have a home health nurse ?

## 2020-01-05 NOTE — BHH Counselor (Signed)
Patient has decided that she would like CSW assistant with referral for home health. She reports that she will review the provided list and identify 2-4 that she would like the CSW to make a referral to.   At the time of this update she has not yet reviewed the list.   Assunta Curtis, MSW, LCSW 01/05/2020 3:41 PM

## 2020-01-05 NOTE — Evaluation (Signed)
Occupational Therapy Evaluation Patient Details Name: Stephanie [name] MRN: [id number] DOB: 1945/11/26 Today's Date: 01/05/2020    History of Present Illness Stephanie [name] is a 71yoF who comes to [facility] 01/02/20 at requrest of her psychaitrist due to 4 days of sleeplessness and manic episodes. PMH:  GERD, fibromyalgia, IBS, Sjogren's disease, RA, OSA, CAD, depression, and BPD. Pt was hoping to again start OPOT soon for her RA of hands.   Clinical Impression   Stephanie [name] was seen for OT evaluation this date. Prior to hospital admission, pt was MOD I for mobility and I/ADLs including driving using SPC. Pt lives alone with family and "church friends" available PRN. Pt presents to acute OT demonstrating decreased engagement in ADLs and daily routines 2/2 functional strength/coordination deficits and lack of healthy coping skills. Currently pt is MOD I for ADLs using RW however pt is unable to push button to flush totilet 2/2 RA pain in knuckles. OT educated pt on body positioning and joint protection strategies and pt able to independently flush toilet using 2 thumbs and UB momentum. Pt is very pleasant and is eager to participate in occupational therapy to promote engagement in meaningful activities. OT provided education to pt on D/C recs, joint protection strategies, falls prevention, AE, sleep hygiene, home/routines modifications, frustration tolerance, various coping skills to manage frustration including asking for help and engagement in meaningful occupations. Pt verbalized understanding. Pt will continue to benefit from skilled OT services in acute setting to address coping skills, engagement in meaningful occupations, daily routines, and safety and independence in ADLs.  Recommend Outpatient OT supported living environment post discharge.      Follow Up Recommendations  Outpatient OT    Equipment Recommendations  None recommended by OT    Recommendations for Other Services        Precautions / Restrictions Precautions Precautions: Fall Restrictions Weight Bearing Restrictions: No      Mobility Bed Mobility Overal bed mobility: Independent          Transfers Overall transfer level: Modified independent Equipment used: Rolling walker (2 wheeled)         Balance Overall balance assessment: Mild deficits observed, not formally tested         ADL either performed or assessed with clinical judgement   ADL Overall ADL's : Needs assistance/impaired        General ADL Comments: Independently don B shoes seated EOB. MOD I toileting usign RW. Pt unable to push button to flush otilet 2/2 RA pain in knuckles. VCs for body positioning and joint protection - pt independently flushes toilet using 2 thumbs and UB momentum. MOD I hand washing standing sink side                  Pertinent Vitals/Pain Pain Assessment: No/denies pain     Hand Dominance Right   Extremity/Trunk Assessment Upper Extremity Assessment Upper Extremity Assessment: Generalized weakness;Overall WFL for tasks assessed (weak 2/2 RA )   Lower Extremity Assessment Lower Extremity Assessment: Overall WFL for tasks assessed       Communication Communication Communication: No difficulties   Cognition Arousal/Alertness: Awake/alert Behavior During Therapy: WFL for tasks assessed/performed;Restless Overall Cognitive Status: Within Functional Limits for tasks assessed               Exercises Exercises: Other exercises Other Exercises Other Exercises: Pt educated re: OT role, DME recs, d/c recs, joint protection strategies, falls prevention, AE, sleep hygiene, home/routines modifications, frustration tolerance, importance of asking and  giving help  Other Exercises: LBD, toileting, hand washing, sup>sit, sit<>stand, sitting/standing balance/tolerance   Shoulder Instructions      Home Living Family/patient expects to be discharged to:: Private residence Living Arrangements:  Alone Available Help at Discharge: Family;Available PRN/intermittently (DTR, Son, 'Church Family') Type of Home: House Home Access: Stairs to enter [employer] of Steps: 2 Entrance Stairs-Rails: Right Home Layout: One level               Home Equipment: Environmental consultant - 2 wheels;Walker - 4 wheels;Cane - single point          Prior Functioning/Environment Level of Independence: Independent with assistive device(s)        Comments: SPC AMB, recently commenced driving and IADL performance after convalescence s/p cervical fracture in May 2021        OT Problem List: Decreased strength;Decreased coordination      OT Treatment/Interventions: Self-care/ADL training;Therapeutic exercise;Energy conservation;DME and/or AE instruction;Therapeutic activities;Patient/family education;Balance training    OT Goals(Current goals can be found in the care plan section) Acute Rehab OT Goals Patient Stated Goal: be as independent as possible, avoid relying on others too much. OT Goal Formulation: With patient Time For Goal Achievement: 01/19/20 Potential to Achieve Goals: Good ADL Goals Pt Will Perform Tub/Shower Transfer: with modified independence;shower seat;rolling walker Additional ADL Goal #1: Pt will Independently verbalize plan to implement x3 ECS Additional ADL Goal #2: Pt will Indepedently verbalize plan to implement x3 falls prevention strategies  OT Frequency: Min 1X/week    AM-PAC OT "6 Clicks" Daily Activity     Outcome Measure Help from another person eating meals?: None Help from another person taking care of personal grooming?: None Help from another person toileting, which includes using toliet, bedpan, or urinal?: A Little Help from another person bathing (including washing, rinsing, drying)?: None Help from another person to put on and taking off regular upper body clothing?: None Help from another person to put on and taking off regular lower body clothing?:  None 6 Click Score: 23   End of Session Equipment Utilized During Treatment: Rolling walker Nurse Communication: Mobility status  Activity Tolerance: Patient tolerated treatment well Patient left: in bed  OT Visit Diagnosis: Other abnormalities of gait and mobility (R26.89)                Time: 9390-3009 OT Time Calculation (min): 42 min Charges:  OT General Charges $OT Visit: 1 Visit OT Evaluation $OT Eval Low Complexity: 1 Low OT Treatments $Self Care/Home Management : 23-37 mins $Therapeutic Activity: 8-22 mins  Dessie Coma, M.S. OTR/L  01/05/20, 4:38 PM  ascom [phone]

## 2020-01-05 NOTE — Telephone Encounter (Signed)
Please call patient back and discuss her concerns and assist . I do not think I have to call for admission. Please verify about home health if its her primary care provider or if we have to do anything.

## 2020-01-05 NOTE — Progress Notes (Signed)
Recreation Therapy Notes   Date: 01/05/2020  Time: 9:30 am   Location: Craft room   Behavioral response: Appropriate  Intervention Topic: Self-Care   Discussion/Intervention:  Group content today was focused on Self-Care. The group defined self-care and some positive ways they care for themselves. Individuals expressed ways and reasons why they neglected any self-care in the past. Patients described ways to improve self-care in the future. The group explained what could happen if they did not do any self-care activities at all. The group participated in the intervention "self-care assessment" where they had a chance to discover some of their weaknesses and strengths in self- care. Patient came up with a self-care plan to improve themselves in the future.   Clinical Observations/Feedback: Patient came to group and identified that she participates in self-care by walking, getting good nutrition and participate in fun activities. She explained that not having good boundaries can prevent positive self-care. Participant expressed that self-care is important to have balance and stability in your life. Patient stated that she would like improve her self-care by swimming more. Individual was social with peers and staff while participating in the intervention.  Stephanie [name] LRT/CTRS           Stephanie [name] 01/05/2020 12:27 PM

## 2020-01-06 DIAGNOSIS — F3112 Bipolar disorder, current episode manic without psychotic features, moderate: Principal | ICD-10-CM

## 2020-01-06 MED ORDER — MENTHOL 3 MG MT LOZG
1.0000 | LOZENGE | OROMUCOSAL | Status: DC | PRN
  Filled 2020-01-06: qty 9

## 2020-01-06 NOTE — Progress Notes (Addendum)
Pt is alert and oriented to person, place, time and situation. Pt is calm, cooperative, pleasant, hyperverbal. Pt denies suicidal and homicidal ideation, denies hallucinations, denies anxiety and depression. Pt spends time grooming self in her room. Pt came out for snacks. Pt reports c/o pain for bilateral hands that is chronic from c/o arthritis, reporting that the medication she got for pain last at [facility] is working to improve her pain. No distress noted, none reported. Will continue to monitor pt per Q15 minute face checks and monitor for safety and progress.

## 2020-01-06 NOTE — Progress Notes (Signed)
Pt is alert and oriented to person, place, time and situation. Pt is calm, cooperative, denies suicidal and homicidal ideation. Pt is hyperverbal, active, uses walker to ambulate, with walker has a steady gait, appetite is good, pt is pleasant and medication complaint. Pt c/o of chronic arthritis pain in her bilateral arms, requests and is given PRN meds for that. No acute distress noted, none reported. Will continue to monitor pt per Q15 minute face checks and monitor for safety and progress.

## 2020-01-06 NOTE — Progress Notes (Signed)
[facility] MD Progress Note  01/06/2020 11:59 AM Stephanie [name]  MRN:  [id number]  Principal Problem: Bipolar 1 disorder, manic, moderate (HCC) Diagnosis: Principal Problem:   Bipolar 1 disorder, manic, moderate ([place]) Active Problems:   H/O gastrointestinal disease   GAD (generalized anxiety disorder)   Insomnia, persistent  Stephanie [name] is a 74 y.o. female who presents to the [facility] unit for treatment of bipolar disorder n the context of current manic episode.   Interval History Patient was seen today for re-evaluation.  Nursing reports no events overnight. The patient has no issues with performing ADLs.  Patient has been medication compliant.    Subjective:  On assessment patient reports feeling "good". Re[ors that her mood is "much better" because of improvement of UTI symptoms. Her only complain today is sore throat without other respiratory symptoms. Denies feeling depressed. She is overly focused on in-person visits with her psychiatrist after discharge. She is still verbose and tangential - speaks about her family, then "church family", her outpatient psychiatrist and visits.  Current suicidal/homicidal ideations: Denies Current auditory/visual hallucinations: Denies The patient reports no side effects from medications.    Labs: no new results for review.     Total Time spent with patient: 20 min  Past Psychiatric History: see H&P  Past Medical History:  Past Medical History:  Diagnosis Date  . Anginal pain ([place])   . Anxiety   . Bipolar 1 disorder ([place])   . Bipolar disorder ([place])   . Coronary artery disease   . Depression   . Fatigue   . Fibromyalgia   . GERD (gastroesophageal reflux disease)   . Heart disease   . Heart failure ([place])   . HLD (hyperlipidemia)   . HOH (hard of hearing)   . Hypothyroidism   . IBS (irritable bowel syndrome)   . Pelvic pain in female   . Perianal lesion    high grade sil lesion- keratinizing type  . RA (rheumatoid arthritis) ([place])   .  Raynaud phenomenon   . Sjogren's disease ([place])   . Sleep apnea   . Vaginal Pap smear, abnormal     Past Surgical History:  Procedure Laterality Date  . CATARACT EXTRACTION W/PHACO Left 02/23/2017   Procedure: CATARACT EXTRACTION PHACO AND INTRAOCULAR LENS PLACEMENT (IOC);  Surgeon: Birder Robson, MD;  Location: [facility] ORS;  Service: Ophthalmology;  Laterality: Left;  [place] 00:35 AP% 9.6 CDE 3.47 Fluid pack lot # [id number]  . CATARACT EXTRACTION W/PHACO Right 03/30/2017   Procedure: CATARACT EXTRACTION PHACO AND INTRAOCULAR LENS PLACEMENT ([place]);  Surgeon: Birder Robson, MD;  Location: [facility] ORS;  Service: Ophthalmology;  Laterality: Right;  [place] 00:32.6 AP% 13.8 CDE 4.51 Fluid Pack Lot # [id number]  . EYE SURGERY    . TONSILLECTOMY     Family History:  Family History  Problem Relation Age of Onset  . Heart failure Father   . Parkinson's disease Mother   . Bipolar disorder Mother   . Depression Sister   . Schizophrenia Paternal Grandmother   . Diabetes Maternal Grandmother   . Pancreatic cancer Maternal Grandmother   . Cancer Neg Hx   . Heart disease Neg Hx   . Breast cancer Neg Hx    Family Psychiatric  History: see H&P  Social History:  Social History   Substance and Sexual Activity  Alcohol Use No  . Alcohol/week: 0.0 standard drinks     Social History   Substance and Sexual Activity  Drug Use No    Social  History   Socioeconomic History  . Marital status: Divorced    Spouse name: Not on file  . Number of children: 2  . Years of education: Not on file  . Highest education level: Master's degree (e.g., MA, MS, MEng, MEd, MSW, MBA)  Occupational History    Comment: retired2  Tobacco Use  . Smoking status: Never Smoker  . Smokeless tobacco: Former Network engineer  . Vaping Use: Never used  Substance and Sexual Activity  . Alcohol use: No    Alcohol/week: 0.0 standard drinks  . Drug use: No  . Sexual activity: Never    Birth control/protection:  Post-menopausal  Other Topics Concern  . Not on file  Social History Narrative  . Not on file   Social Determinants of Health   Financial Resource Strain:   . Difficulty of Paying Living Expenses: Not on file  Food Insecurity:   . Worried About Charity fundraiser in the Last Year: Not on file  . Ran Out of Food in the Last Year: Not on file  Transportation Needs:   . Lack of Transportation (Medical): Not on file  . Lack of Transportation (Non-Medical): Not on file  Physical Activity:   . Days of Exercise per Week: Not on file  . Minutes of Exercise per Session: Not on file  Stress:   . Feeling of Stress : Not on file  Social Connections:   . Frequency of Communication with Friends and Family: Not on file  . Frequency of Social Gatherings with Friends and Family: Not on file  . Attends Religious Services: Not on file  . Active Member of Clubs or Organizations: Not on file  . Attends Archivist Meetings: Not on file  . Marital Status: Not on file   Additional Social History:                         Sleep: Good  Appetite:  Good  Current Medications: Current Facility-Administered Medications  Medication Dose Route Frequency Provider Last Rate Last Admin  . acetaminophen (TYLENOL) tablet 500 mg  500 mg Oral Q8H PRN Salley Scarlet, MD   500 mg at 01/06/20 0506  . alum & mag hydroxide-simeth (MAALOX/MYLANTA) 200-200-20 MG/5ML suspension 30 mL  30 mL Oral Q4H PRN Clapacs, John T, MD      . aspirin chewable tablet 81 mg  81 mg Oral Q breakfast Clapacs, Madie Reno, MD   81 mg at 01/06/20 0840  . calcium-vitamin D (OSCAL WITH D) 500-200 MG-UNIT per tablet 0.5 tablet  0.5 tablet Oral Q breakfast Salley Scarlet, MD   0.5 tablet at 01/06/20 0847  . carbamazepine (TEGRETOL XR) 12 hr tablet 400 mg  400 mg Oral BID Clapacs, Madie Reno, MD   400 mg at 01/06/20 0847  . divalproex (DEPAKOTE ER) 24 hr tablet 500 mg  500 mg Oral BID Clapacs, Madie Reno, MD   500 mg at 01/06/20 0840   . fluticasone (FLONASE) 50 MCG/ACT nasal spray 2 spray  2 spray Each Nare Daily Clapacs, Madie Reno, MD   2 spray at 01/05/20 0839  . folic acid (FOLVITE) tablet 2 mg  2 mg Oral Q breakfast Clapacs, Madie Reno, MD   2 mg at 01/06/20 0840  . gabapentin (NEURONTIN) capsule 300 mg  300 mg Oral BID Clapacs, Madie Reno, MD   300 mg at 01/06/20 0840  . hydrALAZINE (APRESOLINE) tablet 25 mg  25 mg  Oral Q6H PRN Salley Scarlet, MD   25 mg at 01/03/20 4174  . hydrALAZINE (APRESOLINE) tablet 25 mg  25 mg Oral Once Caroline Sauger, NP      . levothyroxine (SYNTHROID) tablet 75 mcg  75 mcg Oral Q0600 Salley Scarlet, MD   75 mcg at 01/06/20 0841  . magnesium hydroxide (MILK OF MAGNESIA) suspension 30 mL  30 mL Oral Daily PRN Clapacs, John T, MD      . menthol-cetylpyridinium (CEPACOL) lozenge 3 mg  1 lozenge Oral PRN Larita Fife, MD      . methotrexate (RHEUMATREX) tablet 15 mg  15 mg Oral Q Wed Clapacs, John T, MD   15 mg at 01/03/20 1224  . mirabegron ER (MYRBETRIQ) tablet 50 mg  50 mg Oral Daily Salley Scarlet, MD   50 mg at 01/06/20 0814  . multivitamin with minerals tablet 1 tablet  1 tablet Oral Q breakfast Clapacs, Madie Reno, MD   1 tablet at 01/06/20 0840  . naproxen (NAPROSYN) tablet 500 mg  500 mg Oral BID PRN Salley Scarlet, MD   500 mg at 01/05/20 1756  . phenylephrine-shark liver oil-mineral oil-petrolatum (PREPARATION H) rectal ointment 1 application  1 application Rectal BID PRN Salley Scarlet, MD   1 application at 48/18/56 1722  . pravastatin (PRAVACHOL) tablet 20 mg  20 mg Oral q1800 Clapacs, Madie Reno, MD   20 mg at 01/05/20 1647  . QUEtiapine (SEROQUEL XR) 24 hr tablet 50 mg  50 mg Oral QHS Salley Scarlet, MD   50 mg at 01/05/20 2115  . temazepam (RESTORIL) capsule 7.5 mg  7.5 mg Oral QHS PRN Salley Scarlet, MD   7.5 mg at 01/05/20 2247    Lab Results: No results found for this or any previous visit (from the past 48 hour(s)).  Blood Alcohol level:  Lab Results  Component Value Date    ETH <10 01/02/2020   ETH <5 31/49/7026    Metabolic Disorder Labs: Lab Results  Component Value Date   HGBA1C 5.3 11/22/2017   Lab Results  Component Value Date   PROLACTIN 33.0 (H) 11/22/2017   Lab Results  Component Value Date   CHOL 222 (H) 01/02/2020   TRIG 190 (H) 01/02/2020   HDL 69 01/02/2020   CHOLHDL 3.2 01/02/2020   VLDL 38 01/02/2020   LDLCALC 115 (H) 01/02/2020   LDLCALC 119 (H) 11/22/2017    Physical Findings: AIMS: Facial and Oral Movements Muscles of Facial Expression: None, normal Lips and Perioral Area: None, normal Jaw: None, normal Tongue: None, normal,Extremity Movements Upper (arms, wrists, hands, fingers): None, normal Lower (legs, knees, ankles, toes): None, normal, Trunk Movements Neck, shoulders, hips: None, normal, Overall Severity Severity of abnormal movements (highest score from questions above): None, normal Incapacitation due to abnormal movements: None, normal Patient's awareness of abnormal movements (rate only patient's report): No Awareness, Dental Status Current problems with teeth and/or dentures?: No Does patient usually wear dentures?: No  CIWA:  CIWA-Ar Total: 1 COWS:  COWS Total Score: 2  Musculoskeletal: Strength & Muscle Tone: within normal limits Gait & Station: normal Patient leans: N/A  Psychiatric Specialty Exam: Physical Exam  Review of Systems  Blood pressure (!) 141/87, pulse 77, temperature 98.1 F (36.7 C), temperature source Oral, resp. rate 18, height 5' (1.524 m), weight 48.1 kg, SpO2 95 %.Body mass index is 20.7 kg/m.  General Appearance: Casual and Fairly Groomed  Eye Contact:  Good  Speech:  Pressured  Volume:  Normal  Mood:  Euthymic  Affect:  Appropriate and Congruent  Thought Process:  Coherent and Goal Directed  Orientation:  Full (Time, Place, and Person)  Thought Content:  Logical, Rumination and Tangential  Suicidal Thoughts:  No  Homicidal Thoughts:  No  Memory:  Immediate;    Fair Recent;   Fair Remote;   Fair  Judgement:  Fair  Insight:  Shallow  Psychomotor Activity:  Normal  Concentration:  Concentration: Fair and Attention Span: Fair  Recall:  [employer] of Knowledge:  Fair  Language:  Fair  Akathisia:  No  Handed:  Right  AIMS (if indicated):     Assets:  Communication Skills Desire for Improvement Housing Social Support  ADL's:  Intact  Cognition:  WNL  Sleep:  Number of Hours: 4.25     Treatment Plan Summary: Daily contact with patient to assess and evaluate symptoms and progress in treatment and Medication management  Patient is a 74 year old female with the above-stated past psychiatric history who is seen in follow-up.  Chart reviewed. Patient discussed with nursing. Patient appears to be improving compare to her presentation upon admission, although still expressing some hypomanic symptoms. Will continue current medicatrions without changes today.   Plan:  -continue inpatient psych admission; 15-minute checks; daily contact with patient to assess and evaluate symptoms and progress in treatment; psychoeducation.  -continue scheduled medications: . aspirin  81 mg Oral Q breakfast  . calcium-vitamin D  0.5 tablet Oral Q breakfast  . carbamazepine  400 mg Oral BID  . divalproex  500 mg Oral BID  . fluticasone  2 spray Each Nare Daily  . folic acid  2 mg Oral Q breakfast  . gabapentin  300 mg Oral BID  . hydrALAZINE  25 mg Oral Once  . levothyroxine  75 mcg Oral Q0600  . methotrexate  15 mg Oral Q Wed  . mirabegron ER  50 mg Oral Daily  . multivitamin with minerals  1 tablet Oral Q breakfast  . pravastatin  20 mg Oral q1800  . QUEtiapine  50 mg Oral QHS   -continue PRN medications.  acetaminophen, alum & mag hydroxide-simeth, hydrALAZINE, magnesium hydroxide, menthol-cetylpyridinium, naproxen, phenylephrine-shark liver oil-mineral oil-petrolatum, temazepam  -Pertinent Labs: no new labs ordered today  -EKG: on 10/26 showed Qtc 425  with normal sinus rhythm    -Consults: No new consults placed since yesterday    -Disposition: Estimated duration of hospitalization: midweek next week. All necessary aftercare will be arranged prior to discharge Likely d/c home with outpatient psych follow-up.  -  I certify that the patient does need, on a daily basis, active treatment furnished directly by or requiring the supervision of inpatient psychiatric facility personnel.   Larita Fife, MD 01/06/2020, 11:59 AM

## 2020-01-06 NOTE — [facility] Group Notes (Signed)
  [facility] LCSW Group Therapy Note  Date/Time:  01/06/2020 1:00 PM- 2:10 PM   Type of Therapy and Topic:  Group Therapy:  Feelings About Hospitalization  Participation Level:  Active   Description of Group This process group involved patients discussing their feelings related to being hospitalized, as well as the benefits they see to being in the hospital.  These feelings and benefits were itemized.  The group then brainstormed specific ways in which they could seek those same benefits when they discharge and return home.  Therapeutic Goals 1. Patient will identify and describe positive and negative feelings related to hospitalization 2. Patient will verbalize benefits of hospitalization to themselves personally 3. Patients will brainstorm together ways they can obtain similar benefits in the outpatient setting, identify barriers to wellness and possible solutions  Summary of Patient Progress:  Patient checked into group feeling better and more hopeful. Patient stated that the hospital keeps her structured and allows her to work on her recovery. Patient spoke about the lack of freedom in the hospital and the changes that have taken place over the years. Patient also spoke about not being able to have all her belongings during hospital stays.   Therapeutic Modalities Cognitive Behavioral Therapy Motivational Interviewing    Raina Mina, [place] 01/06/2020  4:33 PM

## 2020-01-07 DIAGNOSIS — F3112 Bipolar disorder, current episode manic without psychotic features, moderate: Secondary | ICD-10-CM | POA: Diagnosis not present

## 2020-01-07 MED ORDER — TEMAZEPAM 7.5 MG PO CAPS
7.5000 mg | ORAL_CAPSULE | Freq: Every day | ORAL | Status: DC
  Administered 2020-01-07 – 2020-01-10 (×4): 7.5 mg via ORAL
  Filled 2020-01-07 (×4): qty 1

## 2020-01-07 MED ORDER — QUETIAPINE FUMARATE ER 50 MG PO TB24
50.0000 mg | ORAL_TABLET | Freq: Every day | ORAL | Status: DC
  Administered 2020-01-07 – 2020-01-10 (×4): 50 mg via ORAL
  Filled 2020-01-07 (×5): qty 1

## 2020-01-07 NOTE — [facility] Group Notes (Signed)
LCSW Group Therapy Note   01/07/2020 1:05 PM- 2:00 PM    Type of Therapy and Topic: Group Therapy: Fears and Unhealthy Coping Skills   Participation Level: Active   Description of Group: The focus of this group was to discuss some of the prevalent fears that patients experience, and to identify the commonalities among group members. An exercise was used to initiate the discussion, followed by writing on the white board a group-generated list of unhealthy coping and healthy coping techniques to deal with each fear.   Therapeutic Goals: 1. Patient will identify and describe 3 fears they experience 2. Patient will identify one positive coping strategy for each fear they experience 3. Patient will respond empathically to peers statements regarding fears they experience   Summary of Patient Progress: Patient checked into group feeling sleepy. Patient spoke about anxiety being her biggest fear. Patient stated that the hospital has been structured and she is worried about the routine she has when she is discharged. Patient stated she would like PT to help her build strength in her hands. Patient stated her physical therapist has given her techniques on how to flush the toilet and she is very happy about that. Patient stated that she has support at home who will help her through her fears.     Therapeutic Modalities Cognitive Behavioral Therapy Motivational Interviewing    Raina Mina, [place] 01/07/2020 3:04 PM

## 2020-01-07 NOTE — Plan of Care (Signed)
  Problem: Education: Goal: Knowledge of General Education information will improve Description: Including pain rating scale, medication(s)/side effects and non-pharmacologic comfort measures Outcome: Progressing   Problem: Health Behavior/Discharge Planning: Goal: Ability to manage health-related needs will improve Outcome: Progressing   Problem: Clinical Measurements: Goal: Ability to maintain clinical measurements within normal limits will improve Outcome: Progressing Goal: Will remain free from infection Outcome: Progressing Goal: Diagnostic test results will improve Outcome: Progressing Goal: Respiratory complications will improve Outcome: Progressing Goal: Cardiovascular complication will be avoided Outcome: Progressing   Problem: Activity: Goal: Risk for activity intolerance will decrease Outcome: Progressing   Problem: Nutrition: Goal: Adequate nutrition will be maintained Outcome: Progressing   Problem: Coping: Goal: Level of anxiety will decrease Outcome: Progressing   Problem: Elimination: Goal: Will not experience complications related to bowel motility Outcome: Progressing Goal: Will not experience complications related to urinary retention Outcome: Progressing   Problem: Pain Managment: Goal: General experience of comfort will improve Outcome: Progressing   Problem: Safety: Goal: Ability to remain free from injury will improve Outcome: Progressing   Problem: Skin Integrity: Goal: Risk for impaired skin integrity will decrease Outcome: Progressing   Problem: Education: Goal: Ability to state activities that reduce stress will improve Outcome: Progressing   Problem: Coping: Goal: Ability to identify and develop effective coping behavior will improve Outcome: Progressing   Problem: Self-Concept: Goal: Ability to identify factors that promote anxiety will improve Outcome: Progressing Goal: Level of anxiety will decrease Outcome:  Progressing Goal: Ability to modify response to factors that promote anxiety will improve Outcome: Progressing

## 2020-01-07 NOTE — Progress Notes (Signed)
[facility] MD Progress Note  01/07/2020 11:38 AM Stephanie [name]  MRN:  [id number]  Principal Problem: Bipolar 1 disorder, manic, moderate (HCC) Diagnosis: Principal Problem:   Bipolar 1 disorder, manic, moderate (HCC) Active Problems:   H/O gastrointestinal disease   GAD (generalized anxiety disorder)   Insomnia, persistent  Stephanie [name] is a 74 y.o. female who presents to the [facility] unit for treatment of bipolar disorder n the context of current manic episode.   Interval History Patient was seen today for re-evaluation.  Nursing reports no events overnight. The patient has no issues with performing ADLs.  Patient has been medication compliant.    Subjective:  On assessment patient reports she has several issues she would be willing to discuss. First, she complains of receiving her Restoril "too late" last night and it  Affected her sleep. Second, she is asking if it would be possible to take Seroquel at 8pm vs 10pm, so she can be in bed by 9pm. Third, she thinks she has remaining sx of UTI such as urinary frequency. Adam [place], she is willing to speak with Patient Rights representative re her lost belongings in ER; she is planning to discuss that with SW tomorrow. She repors "good mood" overall; she is not depressed and admits feeling "less manic", calmer. No unsafe thoughts. She reports sore throat improved somewhat with lozenges.   Current suicidal/homicidal ideations: Denies Current auditory/visual hallucinations: Denies The patient reports no side effects from medications.    Labs: no new results for review.     Total Time spent with patient: 20 min  Past Psychiatric History: see H&P  Past Medical History:  Past Medical History:  Diagnosis Date  . Anginal pain ([place])   . Anxiety   . Bipolar 1 disorder ([place])   . Bipolar disorder ([place])   . Coronary artery disease   . Depression   . Fatigue   . Fibromyalgia   . GERD (gastroesophageal reflux disease)   . Heart disease   . Heart failure  ([place])   . HLD (hyperlipidemia)   . HOH (hard of hearing)   . Hypothyroidism   . IBS (irritable bowel syndrome)   . Pelvic pain in female   . Perianal lesion    high grade sil lesion- keratinizing type  . RA (rheumatoid arthritis) ([place])   . Raynaud phenomenon   . Sjogren's disease ([place])   . Sleep apnea   . Vaginal Pap smear, abnormal     Past Surgical History:  Procedure Laterality Date  . CATARACT EXTRACTION W/PHACO Left 02/23/2017   Procedure: CATARACT EXTRACTION PHACO AND INTRAOCULAR LENS PLACEMENT (IOC);  Surgeon: Birder Robson, MD;  Location: [facility] ORS;  Service: Ophthalmology;  Laterality: Left;  [place] 00:35 AP% 9.6 CDE 3.47 Fluid pack lot # [id number]  . CATARACT EXTRACTION W/PHACO Right 03/30/2017   Procedure: CATARACT EXTRACTION PHACO AND INTRAOCULAR LENS PLACEMENT ([place]);  Surgeon: Birder Robson, MD;  Location: [facility] ORS;  Service: Ophthalmology;  Laterality: Right;  [place] 00:32.6 AP% 13.8 CDE 4.51 Fluid Pack Lot # [id number]  . EYE SURGERY    . TONSILLECTOMY     Family History:  Family History  Problem Relation Age of Onset  . Heart failure Father   . Parkinson's disease Mother   . Bipolar disorder Mother   . Depression Sister   . Schizophrenia Paternal Grandmother   . Diabetes Maternal Grandmother   . Pancreatic cancer Maternal Grandmother   . Cancer Neg Hx   . Heart disease Neg Hx   .  Breast cancer Neg Hx    Family Psychiatric  History: see H&P  Social History:  Social History   Substance and Sexual Activity  Alcohol Use No  . Alcohol/week: 0.0 standard drinks     Social History   Substance and Sexual Activity  Drug Use No    Social History   Socioeconomic History  . Marital status: Divorced    Spouse name: Not on file  . Number of children: 2  . Years of education: Not on file  . Highest education level: Master's degree (e.g., MA, MS, MEng, MEd, MSW, MBA)  Occupational History    Comment: retired2  Tobacco Use  . Smoking status: Never Smoker   . Smokeless tobacco: Former Network engineer  . Vaping Use: Never used  Substance and Sexual Activity  . Alcohol use: No    Alcohol/week: 0.0 standard drinks  . Drug use: No  . Sexual activity: Never    Birth control/protection: Post-menopausal  Other Topics Concern  . Not on file  Social History Narrative  . Not on file   Social Determinants of Health   Financial Resource Strain:   . Difficulty of Paying Living Expenses: Not on file  Food Insecurity:   . Worried About Charity fundraiser in the Last Year: Not on file  . Ran Out of Food in the Last Year: Not on file  Transportation Needs:   . Lack of Transportation (Medical): Not on file  . Lack of Transportation (Non-Medical): Not on file  Physical Activity:   . Days of Exercise per Week: Not on file  . Minutes of Exercise per Session: Not on file  Stress:   . Feeling of Stress : Not on file  Social Connections:   . Frequency of Communication with Friends and Family: Not on file  . Frequency of Social Gatherings with Friends and Family: Not on file  . Attends Religious Services: Not on file  . Active Member of Clubs or Organizations: Not on file  . Attends Archivist Meetings: Not on file  . Marital Status: Not on file   Additional Social History:                         Sleep: Good  Appetite:  Good  Current Medications: Current Facility-Administered Medications  Medication Dose Route Frequency Provider Last Rate Last Admin  . acetaminophen (TYLENOL) tablet 500 mg  500 mg Oral Q8H PRN Salley Scarlet, MD   500 mg at 01/06/20 2116  . alum & mag hydroxide-simeth (MAALOX/MYLANTA) 200-200-20 MG/5ML suspension 30 mL  30 mL Oral Q4H PRN Clapacs, John T, MD      . aspirin chewable tablet 81 mg  81 mg Oral Q breakfast Clapacs, Madie Reno, MD   81 mg at 01/07/20 0856  . calcium-vitamin D (OSCAL WITH D) 500-200 MG-UNIT per tablet 0.5 tablet  0.5 tablet Oral Q breakfast Salley Scarlet, MD   0.5 tablet at  01/07/20 0856  . carbamazepine (TEGRETOL XR) 12 hr tablet 400 mg  400 mg Oral BID Clapacs, Madie Reno, MD   400 mg at 01/07/20 0856  . divalproex (DEPAKOTE ER) 24 hr tablet 500 mg  500 mg Oral BID Clapacs, Madie Reno, MD   500 mg at 01/07/20 0856  . fluticasone (FLONASE) 50 MCG/ACT nasal spray 2 spray  2 spray Each Nare Daily Clapacs, Madie Reno, MD   2 spray at 01/07/20 0900  . folic  acid (FOLVITE) tablet 2 mg  2 mg Oral Q breakfast Clapacs, Madie Reno, MD   2 mg at 01/07/20 0856  . gabapentin (NEURONTIN) capsule 300 mg  300 mg Oral BID Clapacs, John T, MD   300 mg at 01/07/20 0857  . hydrALAZINE (APRESOLINE) tablet 25 mg  25 mg Oral Q6H PRN Salley Scarlet, MD   25 mg at 01/03/20 1191  . hydrALAZINE (APRESOLINE) tablet 25 mg  25 mg Oral Once Caroline Sauger, NP      . levothyroxine (SYNTHROID) tablet 75 mcg  75 mcg Oral Q0600 Salley Scarlet, MD   75 mcg at 01/07/20 4782  . magnesium hydroxide (MILK OF MAGNESIA) suspension 30 mL  30 mL Oral Daily PRN Clapacs, John T, MD      . menthol-cetylpyridinium (CEPACOL) lozenge 3 mg  1 lozenge Oral PRN Larita Fife, MD      . methotrexate (RHEUMATREX) tablet 15 mg  15 mg Oral Q Wed Clapacs, John T, MD   15 mg at 01/03/20 1224  . mirabegron ER (MYRBETRIQ) tablet 50 mg  50 mg Oral Daily Salley Scarlet, MD   50 mg at 01/07/20 0857  . multivitamin with minerals tablet 1 tablet  1 tablet Oral Q breakfast Clapacs, Madie Reno, MD   1 tablet at 01/07/20 0856  . naproxen (NAPROSYN) tablet 500 mg  500 mg Oral BID PRN Salley Scarlet, MD   500 mg at 01/07/20 0306  . phenylephrine-shark liver oil-mineral oil-petrolatum (PREPARATION H) rectal ointment 1 application  1 application Rectal BID PRN Salley Scarlet, MD   1 application at 95/62/13 1722  . pravastatin (PRAVACHOL) tablet 20 mg  20 mg Oral q1800 Clapacs, Madie Reno, MD   20 mg at 01/06/20 1748  . QUEtiapine (SEROQUEL XR) 24 hr tablet 50 mg  50 mg Oral QHS Akaya Proffit, MD      . temazepam (RESTORIL) capsule 7.5 mg  7.5 mg  Oral QHS Carter Kassel, Delrae Rend, MD        Lab Results: No results found for this or any previous visit (from the past 48 hour(s)).  Blood Alcohol level:  Lab Results  Component Value Date   ETH <10 01/02/2020   ETH <5 08/65/7846    Metabolic Disorder Labs: Lab Results  Component Value Date   HGBA1C 5.3 11/22/2017   Lab Results  Component Value Date   PROLACTIN 33.0 (H) 11/22/2017   Lab Results  Component Value Date   CHOL 222 (H) 01/02/2020   TRIG 190 (H) 01/02/2020   HDL 69 01/02/2020   CHOLHDL 3.2 01/02/2020   VLDL 38 01/02/2020   LDLCALC 115 (H) 01/02/2020   LDLCALC 119 (H) 11/22/2017    Physical Findings: AIMS: Facial and Oral Movements Muscles of Facial Expression: None, normal Lips and Perioral Area: None, normal Jaw: None, normal Tongue: None, normal,Extremity Movements Upper (arms, wrists, hands, fingers): None, normal Lower (legs, knees, ankles, toes): None, normal, Trunk Movements Neck, shoulders, hips: None, normal, Overall Severity Severity of abnormal movements (highest score from questions above): None, normal Incapacitation due to abnormal movements: None, normal Patient's awareness of abnormal movements (rate only patient's report): No Awareness, Dental Status Current problems with teeth and/or dentures?: No Does patient usually wear dentures?: No  CIWA:  CIWA-Ar Total: 1 COWS:  COWS Total Score: 2  Musculoskeletal: Strength & Muscle Tone: within normal limits Gait & Station: normal Patient leans: N/A  Psychiatric Specialty Exam: Physical Exam   Review of Systems  Blood pressure 114/76, pulse 85, temperature 98.2 F (36.8 C), temperature source Oral, resp. rate 18, height 5' (1.524 m), weight 48.1 kg, SpO2 97 %.Body mass index is 20.7 kg/m.  General Appearance: Casual and Fairly Groomed  Eye Contact:  Good  Speech:  Pressured  Volume:  Normal  Mood:  Euthymic  Affect:  Appropriate and Congruent  Thought Process:  Coherent and Goal Directed   Orientation:  Full (Time, Place, and Person)  Thought Content:  Logical, Rumination and Tangential  Suicidal Thoughts:  No  Homicidal Thoughts:  No  Memory:  Immediate;   Fair Recent;   Fair Remote;   Fair  Judgement:  Fair  Insight:  Shallow  Psychomotor Activity:  Normal  Concentration:  Concentration: Fair and Attention Span: Fair  Recall:  [employer] of Knowledge:  Fair  Language:  Fair  Akathisia:  No  Handed:  Right  AIMS (if indicated):     Assets:  Communication Skills Desire for Improvement Housing Social Support  ADL's:  Intact  Cognition:  WNL  Sleep:  Number of Hours: 7.75     Treatment Plan Summary: Daily contact with patient to assess and evaluate symptoms and progress in treatment and Medication management  Patient is a 74 year old female with the above-stated past psychiatric history who is seen in follow-up.  Chart reviewed. Patient discussed with nursing. Patient appears to be improving compare to her presentation upon admission, although still expressing some hypomanic symptoms. Will continue current medicatrions without significant changes today. Changed Restoril from PRN to scheduled at 8pm; changed time for Seroquel from 10pm to 8pm.   Plan:  -continue inpatient psych admission; 15-minute checks; daily contact with patient to assess and evaluate symptoms and progress in treatment; psychoeducation.  -continue scheduled medications: . aspirin  81 mg Oral Q breakfast  . calcium-vitamin D  0.5 tablet Oral Q breakfast  . carbamazepine  400 mg Oral BID  . divalproex  500 mg Oral BID  . fluticasone  2 spray Each Nare Daily  . folic acid  2 mg Oral Q breakfast  . gabapentin  300 mg Oral BID  . hydrALAZINE  25 mg Oral Once  . levothyroxine  75 mcg Oral Q0600  . methotrexate  15 mg Oral Q Wed  . mirabegron ER  50 mg Oral Daily  . multivitamin with minerals  1 tablet Oral Q breakfast  . pravastatin  20 mg Oral q1800  . QUEtiapine  50 mg Oral QHS  .  temazepam  7.5 mg Oral QHS   -continue PRN medications.  acetaminophen, alum & mag hydroxide-simeth, hydrALAZINE, magnesium hydroxide, menthol-cetylpyridinium, naproxen, phenylephrine-shark liver oil-mineral oil-petrolatum  -Pertinent Labs: new order placed for UA.  -EKG: on 10/26 showed Qtc 425 with normal sinus rhythm    -Consults: No new consults placed since yesterday    -Disposition: Estimated duration of hospitalization: midweek next week. All necessary aftercare will be arranged prior to discharge Likely d/c home with outpatient psych follow-up.  -  I certify that the patient does need, on a daily basis, active treatment furnished directly by or requiring the supervision of inpatient psychiatric facility personnel.   Larita Fife, MD 01/07/2020, 11:38 AM

## 2020-01-07 NOTE — Plan of Care (Signed)
Patient denies SI/HI. Patient is compliant with medications. Patient with no complaints; focused on discharge. Patient given support and encouragement. Monitoring continues.   Problem: Education: Goal: Knowledge of General Education information will improve Description: Including pain rating scale, medication(s)/side effects and non-pharmacologic comfort measures Outcome: Progressing   Problem: Health Behavior/Discharge Planning: Goal: Ability to manage health-related needs will improve Outcome: Progressing   Problem: Clinical Measurements: Goal: Ability to maintain clinical measurements within normal limits will improve Outcome: Progressing Goal: Will remain free from infection Outcome: Progressing Goal: Respiratory complications will improve Outcome: Progressing Goal: Cardiovascular complication will be avoided Outcome: Progressing   Problem: Activity: Goal: Risk for activity intolerance will decrease Outcome: Progressing   Problem: Nutrition: Goal: Adequate nutrition will be maintained Outcome: Progressing   Problem: Coping: Goal: Level of anxiety will decrease Outcome: Progressing   Problem: Elimination: Goal: Will not experience complications related to bowel motility Outcome: Progressing Goal: Will not experience complications related to urinary retention Outcome: Progressing   Problem: Pain Management: Goal: General experience of comfort will improve Outcome: Progressing   Problem: Safety: Goal: Ability to remain free from injury will improve Outcome: Progressing   Problem: Skin Integrity: Goal: Risk for impaired skin integrity will decrease Outcome: Progressing   Problem: Education: Goal: Ability to state activities that reduce stress will improve Outcome: Progressing   Problem: Coping: Goal: Ability to identify and develop effective coping behavior will improve Outcome: Progressing   Problem: Self-Concept: Goal: Ability to identify factors that  promote anxiety will improve Outcome: Progressing Goal: Level of anxiety will decrease Outcome: Progressing Goal: Ability to modify response to factors that promote anxiety will improve Outcome: Progressing

## 2020-01-08 ENCOUNTER — Telehealth: Payer: Self-pay

## 2020-01-08 DIAGNOSIS — F3112 Bipolar disorder, current episode manic without psychotic features, moderate: Secondary | ICD-10-CM | POA: Diagnosis not present

## 2020-01-08 LAB — URINALYSIS, COMPLETE (UACMP) WITH MICROSCOPIC
Bacteria, UA: NONE SEEN
Bilirubin Urine: NEGATIVE
Glucose, UA: NEGATIVE mg/dL
Ketones, ur: NEGATIVE mg/dL
Nitrite: NEGATIVE
Protein, ur: 30 mg/dL — AB
Specific Gravity, Urine: 1.009 (ref 1.005–1.030)
WBC, UA: >50 WBC/hpf — ABNORMAL HIGH (ref 0–5)
pH: 7 (ref 5.0–8.0)

## 2020-01-08 MED ORDER — LORAZEPAM 1 MG PO TABS: 0.5000 mg | ORAL_TABLET | Freq: Three times a day (TID) | ORAL | Status: DC | PRN

## 2020-01-08 MED ORDER — NITROFURANTOIN MONOHYD MACRO 100 MG PO CAPS
100.0000 mg | ORAL_CAPSULE | Freq: Two times a day (BID) | ORAL | Status: DC
  Administered 2020-01-08 – 2020-01-11 (×6): 100 mg via ORAL
  Filled 2020-01-08 (×7): qty 1

## 2020-01-08 MED ORDER — FLUCONAZOLE 50 MG PO TABS
150.0000 mg | ORAL_TABLET | Freq: Once | ORAL | Status: AC
  Administered 2020-01-08: 150 mg via ORAL
  Filled 2020-01-08: qty 1

## 2020-01-08 MED ORDER — FAMOTIDINE 20 MG PO TABS
10.0000 mg | ORAL_TABLET | Freq: Every day | ORAL | Status: DC
  Administered 2020-01-08 – 2020-01-11 (×4): 10 mg via ORAL
  Filled 2020-01-08 (×4): qty 1

## 2020-01-08 NOTE — Progress Notes (Signed)
Pt is alert and oriented to person, place, time and situation. Pt is calm, cooperative, hyperactive, hyperverbal, loud, pressured speech, reports a good appetite, reports she slept well last night. Pt uses a walker to ambulate, with her walker pt has a steady gait. Pt is independent related to ADLs, grooms self. Pt is social with staff. Pt denies suicidal and homicidal ideation, denies hallucinations, denies feelings of depression and anxiety. No distress noted, none reported. Will continue to monitor pt per Q15 minute face checks and monitor for safety and progress.

## 2020-01-08 NOTE — Telephone Encounter (Signed)
Appointment - Patient being discharged and in need of a new appointment with Dr. Shea Evans.  Collateral requests call back to schedule.

## 2020-01-08 NOTE — Progress Notes (Signed)
Patient is alert and oriented x 4. She is pleasant and cooperative.  She presents to the med room wanting her medications to be given at exactly 8pm and proceeds to explain her medical history.  She denies SI HI AVH depression at this encounter. She does endorse pain and rates her pain at 7 as she explains that she has chronic rheumatoid issues. She received her medication and tolerated without incident. She is safe on the unit with 15 minute safety checks and was encouraged to contact staff with any concerns.     Cleo Butler-Nicholson, LPN

## 2020-01-08 NOTE — Progress Notes (Signed)
PT Cancellation Note  Patient Details Name: Stephanie [name] MRN: [id number] DOB: 16-Jul-1945   Cancelled Treatment:    Reason Eval/Treat Not Completed: PT screened, no needs identified, will sign off;Patient at procedure or test/unavailable (Chart reviewed, RN consulted. Pt in group session at this time. RN reports pt's mobility has been very good last few days, AMB out to nurses' station several times daily without issue.) As patient is AMB ad lib on unit with RW, good safety awareness and balance/gait at baseline, will sign off at this time.   2:32 PM, 01/08/20 Etta Grandchild, PT, DPT Physical Therapist - [facility]  [phone] ([place])    [place] C 01/08/2020, 2:32 PM

## 2020-01-08 NOTE — BHH Counselor (Signed)
CSW contacted [facility] regarding hospital follow up appointment. Message was taken with contact information to have doctor to follow up.  Chalmers Guest. Guerry Bruin, MSW, [place], [place] 01/08/2020 2:55 PM

## 2020-01-08 NOTE — B[facility] Group Notes (Signed)
LCSW Group Therapy Note   01/08/2020 1:00 PM  Type of Therapy and Topic:  Group Therapy:  Overcoming Obstacles   Participation Level:  Active   Description of Group:    In this group patients will be encouraged to explore what they see as obstacles to their own wellness and recovery. They will be guided to discuss their thoughts, feelings, and behaviors related to these obstacles. The group will process together ways to cope with barriers, with attention given to specific choices patients can make. Each patient will be challenged to identify changes they are motivated to make in order to overcome their obstacles. This group will be process-oriented, with patients participating in exploration of their own experiences as well as giving and receiving support and challenge from other group members.   Therapeutic Goals: 1. Patient will identify personal and current obstacles as they relate to admission. 2. Patient will identify barriers that currently interfere with their wellness or overcoming obstacles.  3. Patient will identify feelings, thought process and behaviors related to these barriers. 4. Patient will identify two changes they are willing to make to overcome these obstacles:      Summary of Patient Progress Patient was present in group. Patient shared how she has struggled with mental illness and stigma as her barriers.  Patient was supportive of other group members.     Therapeutic Modalities:   Cognitive Behavioral Therapy Solution Focused Therapy Motivational Interviewing Relapse Prevention Therapy  Assunta Curtis, MSW, LCSW 01/08/2020 12:53 PM

## 2020-01-08 NOTE — Progress Notes (Signed)
Recreation Therapy Notes   Date: 01/08/2020  Time: 9:30 am   Location: Craft room   Behavioral response: Appropriate  Intervention Topic: Necessities   Discussion/Intervention:  Group content on today was focused on necessities. The group defined necessities and how they determine their necessities. Individuals expressed how many necessities they have and if it changes from day to day. Patients described the difference between wants and needs. The group explained how they have overspent on wants in the past. Individuals described a reoccurring necessity for them. The intervention "What I need" helped patients differentiate between wants and needs.  Clinical Observations/Feedback: Patient came to group late and described the difference between a want and a need as a need is more urgent and a want is a wish.She stated that needs come from background and environment. Individual was social with peers and staff while participating in the intervention.  Stephanie [name] LRT/CTRS         Stephanie [name] 01/08/2020 11:44 AM

## 2020-01-08 NOTE — Progress Notes (Signed)
[facility] MD Progress Note  01/08/2020 3:20 PM Stephanie [name]  MRN:  [id number]  Principal Problem: Bipolar 1 disorder, manic, moderate (HCC) Diagnosis: Principal Problem:   Bipolar 1 disorder, manic, moderate ([place]) Active Problems:   H/O gastrointestinal disease   GAD (generalized anxiety disorder)   Insomnia, persistent  Stephanie [name] is a 74 y.o. female who presents to the [facility] unit for treatment of bipolar disorder n the context of current manic episode.   Interval History Patient was seen today for re-evaluation.  Nursing reports no events overnight. The patient has no issues with performing ADLs.  Patient has been medication compliant.    Subjective:  On assessment patient reports she has several issues she would be willing to discuss. She thinks she has remaining sx of UTI such as urinary frequency and dysuria. Will recheck UA and urine culture to ensure UTI has cleared. Will also treat for yeast infection. She also reports sore throat that seems to worsen with heavy food. She has a history of GERD, and reports that Prilosec typically helps. She repors "good mood" overall; she is not depressed and admits feeling "less manic", calmer. No unsafe thoughts. She remains pressured in speech, and tangential on exam. Mood somewhat irritable today.   Current suicidal/homicidal ideations: Denies Current auditory/visual hallucinations: Denies The patient reports no side effects from medications.    Labs: no new results for review.     Total Time spent with patient: 45 min  Past Psychiatric History: see H&P  Past Medical History:  Past Medical History:  Diagnosis Date  . Anginal pain ([place])   . Anxiety   . Bipolar 1 disorder ([place])   . Bipolar disorder ([place])   . Coronary artery disease   . Depression   . Fatigue   . Fibromyalgia   . GERD (gastroesophageal reflux disease)   . Heart disease   . Heart failure ([place])   . HLD (hyperlipidemia)   . HOH (hard of hearing)   . Hypothyroidism   .  IBS (irritable bowel syndrome)   . Pelvic pain in female   . Perianal lesion    high grade sil lesion- keratinizing type  . RA (rheumatoid arthritis) ([place])   . Raynaud phenomenon   . Sjogren's disease ([place])   . Sleep apnea   . Vaginal Pap smear, abnormal     Past Surgical History:  Procedure Laterality Date  . CATARACT EXTRACTION W/PHACO Left 02/23/2017   Procedure: CATARACT EXTRACTION PHACO AND INTRAOCULAR LENS PLACEMENT (IOC);  Surgeon: Birder Robson, MD;  Location: [facility] ORS;  Service: Ophthalmology;  Laterality: Left;  [place] 00:35 AP% 9.6 CDE 3.47 Fluid pack lot # [id number]  . CATARACT EXTRACTION W/PHACO Right 03/30/2017   Procedure: CATARACT EXTRACTION PHACO AND INTRAOCULAR LENS PLACEMENT ([place]);  Surgeon: Birder Robson, MD;  Location: [facility] ORS;  Service: Ophthalmology;  Laterality: Right;  [place] 00:32.6 AP% 13.8 CDE 4.51 Fluid Pack Lot # [id number]  . EYE SURGERY    . TONSILLECTOMY     Family History:  Family History  Problem Relation Age of Onset  . Heart failure Father   . Parkinson's disease Mother   . Bipolar disorder Mother   . Depression Sister   . Schizophrenia Paternal Grandmother   . Diabetes Maternal Grandmother   . Pancreatic cancer Maternal Grandmother   . Cancer Neg Hx   . Heart disease Neg Hx   . Breast cancer Neg Hx    Family Psychiatric  History: see H&P  Social History:  Social  History   Substance and Sexual Activity  Alcohol Use No  . Alcohol/week: 0.0 standard drinks     Social History   Substance and Sexual Activity  Drug Use No    Social History   Socioeconomic History  . Marital status: Divorced    Spouse name: Not on file  . Number of children: 2  . Years of education: Not on file  . Highest education level: Master's degree (e.g., MA, MS, MEng, MEd, MSW, MBA)  Occupational History    Comment: retired2  Tobacco Use  . Smoking status: Never Smoker  . Smokeless tobacco: Former Network engineer  . Vaping Use: Never used   Substance and Sexual Activity  . Alcohol use: No    Alcohol/week: 0.0 standard drinks  . Drug use: No  . Sexual activity: Never    Birth control/protection: Post-menopausal  Other Topics Concern  . Not on file  Social History Narrative  . Not on file   Social Determinants of Health   Financial Resource Strain:   . Difficulty of Paying Living Expenses: Not on file  Food Insecurity:   . Worried About Charity fundraiser in the Last Year: Not on file  . Ran Out of Food in the Last Year: Not on file  Transportation Needs:   . Lack of Transportation (Medical): Not on file  . Lack of Transportation (Non-Medical): Not on file  Physical Activity:   . Days of Exercise per Week: Not on file  . Minutes of Exercise per Session: Not on file  Stress:   . Feeling of Stress : Not on file  Social Connections:   . Frequency of Communication with Friends and Family: Not on file  . Frequency of Social Gatherings with Friends and Family: Not on file  . Attends Religious Services: Not on file  . Active Member of Clubs or Organizations: Not on file  . Attends Archivist Meetings: Not on file  . Marital Status: Not on file   Additional Social History:                         Sleep: Good  Appetite:  Good  Current Medications: Current Facility-Administered Medications  Medication Dose Route Frequency Provider Last Rate Last Admin  . acetaminophen (TYLENOL) tablet 500 mg  500 mg Oral Q8H PRN Salley Scarlet, MD   500 mg at 01/07/20 1419  . alum & mag hydroxide-simeth (MAALOX/MYLANTA) 200-200-20 MG/5ML suspension 30 mL  30 mL Oral Q4H PRN Clapacs, John T, MD      . aspirin chewable tablet 81 mg  81 mg Oral Q breakfast Clapacs, Madie Reno, MD   81 mg at 01/08/20 0827  . calcium-vitamin D (OSCAL WITH D) 500-200 MG-UNIT per tablet 0.5 tablet  0.5 tablet Oral Q breakfast Salley Scarlet, MD   0.5 tablet at 01/08/20 1324  . carbamazepine (TEGRETOL XR) 12 hr tablet 400 mg  400 mg  Oral BID Clapacs, Madie Reno, MD   400 mg at 01/08/20 0829  . divalproex (DEPAKOTE ER) 24 hr tablet 500 mg  500 mg Oral BID Clapacs, Madie Reno, MD   500 mg at 01/08/20 0828  . famotidine (PEPCID) tablet 10 mg  10 mg Oral Daily Salley Scarlet, MD      . fluconazole (DIFLUCAN) tablet 150 mg  150 mg Oral Once Salley Scarlet, MD      . fluticasone [facility]) 50 MCG/ACT nasal spray  2 spray  2 spray Each Nare Daily Clapacs, Madie Reno, MD   2 spray at 01/07/20 0900  . folic acid (FOLVITE) tablet 2 mg  2 mg Oral Q breakfast Clapacs, Madie Reno, MD   2 mg at 01/08/20 0828  . gabapentin (NEURONTIN) capsule 300 mg  300 mg Oral BID Clapacs, Madie Reno, MD   300 mg at 01/08/20 0828  . hydrALAZINE (APRESOLINE) tablet 25 mg  25 mg Oral Q6H PRN Salley Scarlet, MD   25 mg at 01/03/20 7062  . levothyroxine (SYNTHROID) tablet 75 mcg  75 mcg Oral Q0600 Salley Scarlet, MD   75 mcg at 01/08/20 [phone]  . LORazepam (ATIVAN) tablet 0.5 mg  0.5 mg Oral Q8H PRN Salley Scarlet, MD      . magnesium hydroxide (MILK OF MAGNESIA) suspension 30 mL  30 mL Oral Daily PRN Clapacs, John T, MD      . menthol-cetylpyridinium (CEPACOL) lozenge 3 mg  1 lozenge Oral PRN Larita Fife, MD      . methotrexate (RHEUMATREX) tablet 15 mg  15 mg Oral Q Wed Clapacs, Madie Reno, MD   15 mg at 01/03/20 1224  . mirabegron ER (MYRBETRIQ) tablet 50 mg  50 mg Oral Daily Salley Scarlet, MD   50 mg at 01/08/20 0830  . multivitamin with minerals tablet 1 tablet  1 tablet Oral Q breakfast Clapacs, Madie Reno, MD   1 tablet at 01/08/20 0827  . naproxen (NAPROSYN) tablet 500 mg  500 mg Oral BID PRN Salley Scarlet, MD   500 mg at 01/08/20 1121  . phenylephrine-shark liver oil-mineral oil-petrolatum (PREPARATION H) rectal ointment 1 application  1 application Rectal BID PRN Salley Scarlet, MD   1 application at 83/15/17 1722  . pravastatin (PRAVACHOL) tablet 20 mg  20 mg Oral q1800 Clapacs, Madie Reno, MD   20 mg at 01/07/20 2011  . QUEtiapine (SEROQUEL XR) 24 hr tablet 50 mg   50 mg Oral QHS Larita Fife, MD   50 mg at 01/07/20 2012  . temazepam (RESTORIL) capsule 7.5 mg  7.5 mg Oral QHS Larita Fife, MD   7.5 mg at 01/07/20 2011    Lab Results: No results found for this or any previous visit (from the past 48 hour(s)).  Blood Alcohol level:  Lab Results  Component Value Date   ETH <10 01/02/2020   ETH <5 61/60/7371    Metabolic Disorder Labs: Lab Results  Component Value Date   HGBA1C 5.3 11/22/2017   Lab Results  Component Value Date   PROLACTIN 33.0 (H) 11/22/2017   Lab Results  Component Value Date   CHOL 222 (H) 01/02/2020   TRIG 190 (H) 01/02/2020   HDL 69 01/02/2020   CHOLHDL 3.2 01/02/2020   VLDL 38 01/02/2020   LDLCALC 115 (H) 01/02/2020   LDLCALC 119 (H) 11/22/2017    Physical Findings: AIMS: Facial and Oral Movements Muscles of Facial Expression: None, normal Lips and Perioral Area: None, normal Jaw: None, normal Tongue: None, normal,Extremity Movements Upper (arms, wrists, hands, fingers): None, normal Lower (legs, knees, ankles, toes): None, normal, Trunk Movements Neck, shoulders, hips: None, normal, Overall Severity Severity of abnormal movements (highest score from questions above): None, normal Incapacitation due to abnormal movements: None, normal Patient's awareness of abnormal movements (rate only patient's report): No Awareness, Dental Status Current problems with teeth and/or dentures?: No Does patient usually wear dentures?: No  CIWA:  CIWA-Ar Total: 1 COWS:  COWS Total Score:  2  Musculoskeletal: Strength & Muscle Tone: within normal limits Gait & Station: normal Patient leans: N/A  Psychiatric Specialty Exam: Physical Exam Vitals and nursing note reviewed.  Constitutional:      Appearance: Normal appearance.  HENT:     Head: Normocephalic and atraumatic.     Right Ear: External ear normal.     Left Ear: External ear normal.     Nose: Nose normal.     Mouth/Throat:     Mouth: Mucous membranes are  moist.     Pharynx: Oropharynx is clear.  Eyes:     Extraocular Movements: Extraocular movements intact.     Conjunctiva/sclera: Conjunctivae normal.     Pupils: Pupils are equal, round, and reactive to light.  Cardiovascular:     Rate and Rhythm: Normal rate.     Pulses: Normal pulses.  Pulmonary:     Effort: Pulmonary effort is normal.     Breath sounds: Normal breath sounds.  Abdominal:     General: Abdomen is flat.     Palpations: Abdomen is soft.  Musculoskeletal:        General: Normal range of motion.     Cervical back: Normal range of motion and neck supple.     Comments: Rheumatoid arthritis in bilateral fingers and toes  Skin:    General: Skin is warm and dry.  Neurological:     General: No focal deficit present.     Mental Status: She is alert and oriented to person, place, and time.  Psychiatric:        Attention and Perception: Attention normal.        Mood and Affect: Mood is anxious.        Speech: Speech is rapid and pressured.        Behavior: Behavior is agitated.        Thought Content: Thought content normal.        Cognition and Memory: Cognition and memory normal.        Judgment: Judgment normal.     Review of Systems  Constitutional: Positive for appetite change and fatigue.  HENT: Positive for ear pain and sore throat.   Eyes: Negative for photophobia and visual disturbance.  Respiratory: Negative for cough and shortness of breath.   Cardiovascular: Negative for chest pain and palpitations.  Gastrointestinal: Negative for constipation, diarrhea, nausea and vomiting.  Endocrine: Negative for cold intolerance and heat intolerance.  Genitourinary: Positive for dysuria and urgency.  Musculoskeletal: Negative for arthralgias and myalgias.  Skin: Negative for rash and wound.  Allergic/Immunologic: Negative for food allergies and immunocompromised state.  Neurological: Negative for dizziness and headaches.  Hematological: Negative for adenopathy. Does  not bruise/bleed easily.  Psychiatric/Behavioral: Positive for sleep disturbance. Negative for hallucinations and suicidal ideas. The patient is nervous/anxious.     Blood pressure 136/82, pulse 83, temperature 98 F (36.7 C), temperature source Oral, resp. rate 18, height 5' (1.524 m), weight 48.1 kg, SpO2 97 %.Body mass index is 20.7 kg/m.  General Appearance: Casual and Fairly Groomed  Eye Contact:  Good  Speech:  Pressured  Volume:  Normal  Mood:  Irritable  Affect:  Congruent  Thought Process:  Coherent and Goal Directed  Orientation:  Full (Time, Place, and Person)  Thought Content:  Logical, Rumination and Tangential  Suicidal Thoughts:  No  Homicidal Thoughts:  No  Memory:  Immediate;   Fair Recent;   Fair Remote;   Fair  Judgement:  Fair  Insight:  Shallow  Psychomotor Activity:  Normal  Concentration:  Concentration: Fair and Attention Span: Fair  Recall:  [employer] of Knowledge:  Fair  Language:  Fair  Akathisia:  No  Handed:  Right  AIMS (if indicated):     Assets:  Communication Skills Desire for Improvement Housing Social Support  ADL's:  Intact  Cognition:  WNL  Sleep:  Number of Hours: 7.75     Treatment Plan Summary: Daily contact with patient to assess and evaluate symptoms and progress in treatment and Medication management  Patient is a 74 year old female with the above-stated past psychiatric history who is seen in follow-up.  Chart reviewed. Patient discussed with nursing. Patient appears to be improving compare to her presentation upon admission, although still expressing some hypomanic symptoms. Will continue current medicatrions without significant changes today. Diflucan 150 mg one time dose for yeast infection, pepcid 10 mg daily for GERD, repeat UA and Ucx to ensure UTI has been treated.    Plan:  -continue inpatient psych admission; 15-minute checks; daily contact with patient to assess and evaluate symptoms and progress in treatment;  psychoeducation.  -continue scheduled medications: . aspirin  81 mg Oral Q breakfast  . calcium-vitamin D  0.5 tablet Oral Q breakfast  . carbamazepine  400 mg Oral BID  . divalproex  500 mg Oral BID  . famotidine  10 mg Oral Daily  . fluconazole  150 mg Oral Once  . fluticasone  2 spray Each Nare Daily  . folic acid  2 mg Oral Q breakfast  . gabapentin  300 mg Oral BID  . levothyroxine  75 mcg Oral Q0600  . methotrexate  15 mg Oral Q Wed  . mirabegron ER  50 mg Oral Daily  . multivitamin with minerals  1 tablet Oral Q breakfast  . pravastatin  20 mg Oral q1800  . QUEtiapine  50 mg Oral QHS  . temazepam  7.5 mg Oral QHS   -continue PRN medications.  acetaminophen, alum & mag hydroxide-simeth, hydrALAZINE, LORazepam, magnesium hydroxide, menthol-cetylpyridinium, naproxen, phenylephrine-shark liver oil-mineral oil-petrolatum  -Pertinent Labs: new order placed for repeat UA and urine culture to ensure infection has cleared  -EKG: on 10/26 showed Qtc 425 with normal sinus rhythm    -Consults: No new consults placed since yesterday    -Disposition: Estimated duration of hospitalization: midweek next week. All necessary aftercare will be arranged prior to discharge Likely d/c home with outpatient psych follow-up.  -  I certify that the patient does need, on a daily basis, active treatment furnished directly by or requiring the supervision of inpatient psychiatric facility personnel.   Salley Scarlet, MD 01/08/2020, 3:20 PM

## 2020-01-08 NOTE — BHH Counselor (Signed)
CSW contacted [place] with [facility] to set the patient up for home health.   [place] asked for CSW to contact again when patient is being discharged.    Assunta Curtis, MSW, LCSW 01/08/2020 2:38 PM

## 2020-01-09 ENCOUNTER — Ambulatory Visit: Payer: Medicare PPO | Admitting: Occupational Therapy

## 2020-01-09 DIAGNOSIS — F3112 Bipolar disorder, current episode manic without psychotic features, moderate: Secondary | ICD-10-CM | POA: Diagnosis not present

## 2020-01-09 LAB — URINE CULTURE: Culture: <10000 — AB

## 2020-01-09 NOTE — Progress Notes (Signed)
Pt is alert and oriented to person, place, time and situation. Pt is calm, cooperative, active, uses walker, gait is steady with use of her walker. Pt has been less intrusive today than yesterday. Pt spends time in her room grooming, has been medication complaint, appetite is good, denies feelings of depression or anxiety, denies hallucinations, denies suicidal and homicidal ideation. Pt's affect is bright, pt smiles often. Pt reports that her UTI symptoms are gone today. No distress noted, none reported. Will continue to monitor pt per Q15 minute face checks and monitor for safety and progress.

## 2020-01-09 NOTE — Progress Notes (Signed)
Patient is pleasant and cooperative. She is med compliant and hyper focused on her medications this evening.  She was satisfied after we went over each medication and explained new medication for UTI treatment.  She denied SI  HI  AVH depression at this encounter. She does endorse having some pain and asked for Tylenol to help.  She tolerated her meds without incident. She is safe on the unit with 15 minute safety checks and encouraged to contact staff with any concerns.    Cleo Butler-Nicholson, LPN

## 2020-01-09 NOTE — Progress Notes (Signed)
Recreation Therapy Notes  Date: 01/09/2020  Time: 9:30 am   Location: Craft room  Behavioral response: Appropriate  Intervention Topic: Goals   Discussion/Intervention:  Group content on today was focused on goals. Patients described what goals are and how they define goals. Individuals expressed how they go about setting goals and reaching them. The group identified how important goals are and if they make short term goals to reach long term goals. Patients described how many goals they work on at a time and what affects them not reaching their goal. Individuals described how much time they put into planning and obtaining their goals. The group participated in the intervention "My Goal Board" and made personal goal boards to help them achieve their goal.  Clinical Observations/Feedback: Patient came to group late due to unknown reasons. She explained that her short term goal is to take a shower and her long term goal is to function better. Individual was social with peers and staff while participating in the intervention.  Mathew Storck LRT/CTRS         Tanika Bracco 01/09/2020 1:34 PM

## 2020-01-09 NOTE — BHH Group Notes (Signed)
LCSW Group Therapy Note  01/09/2020 2:39 PM  Type of Therapy/Topic:  Group Therapy:  Feelings about Diagnosis  Participation Level:  Did Not Attend   Description of Group:   This group will allow patients to explore their thoughts and feelings about diagnoses they have received. Patients will be guided to explore their level of understanding and acceptance of these diagnoses. Facilitator will encourage patients to process their thoughts and feelings about the reactions of others to their diagnosis and will guide patients in identifying ways to discuss their diagnosis with significant others in their lives. This group will be process-oriented, with patients participating in exploration of their own experiences, giving and receiving support, and processing challenge from other group members.   Therapeutic Goals: 1. Patient will demonstrate understanding of diagnosis as evidenced by identifying two or more symptoms of the disorder 2. Patient will be able to express two feelings regarding the diagnosis 3. Patient will demonstrate their ability to communicate their needs through discussion and/or role play  Summary of Patient Progress: X  Therapeutic Modalities:   Cognitive Behavioral Therapy Brief Therapy Feelings Identification   Janyth Riera R. Guerry Bruin, MSW, [place], [place] 01/09/2020 2:39 PM

## 2020-01-09 NOTE — Plan of Care (Signed)
  Problem: Education: Goal: Knowledge of General Education information will improve Description: Including pain rating scale, medication(s)/side effects and non-pharmacologic comfort measures Outcome: Progressing   Problem: Health Behavior/Discharge Planning: Goal: Ability to manage health-related needs will improve Outcome: Progressing   Problem: Clinical Measurements: Goal: Ability to maintain clinical measurements within normal limits will improve Outcome: Progressing Goal: Will remain free from infection Outcome: Progressing Goal: Diagnostic test results will improve Outcome: Progressing Goal: Respiratory complications will improve Outcome: Progressing Goal: Cardiovascular complication will be avoided Outcome: Progressing   Problem: Activity: Goal: Risk for activity intolerance will decrease Outcome: Progressing   Problem: Nutrition: Goal: Adequate nutrition will be maintained Outcome: Progressing   Problem: Coping: Goal: Level of anxiety will decrease Outcome: Progressing   Problem: Elimination: Goal: Will not experience complications related to bowel motility Outcome: Progressing Goal: Will not experience complications related to urinary retention Outcome: Progressing   Problem: Pain Managment: Goal: General experience of comfort will improve Outcome: Progressing   Problem: Safety: Goal: Ability to remain free from injury will improve Outcome: Progressing   Problem: Skin Integrity: Goal: Risk for impaired skin integrity will decrease Outcome: Progressing   Problem: Education: Goal: Ability to state activities that reduce stress will improve Outcome: Progressing   Problem: Coping: Goal: Ability to identify and develop effective coping behavior will improve Outcome: Progressing   Problem: Self-Concept: Goal: Ability to identify factors that promote anxiety will improve Outcome: Progressing Goal: Level of anxiety will decrease Outcome:  Progressing Goal: Ability to modify response to factors that promote anxiety will improve Outcome: Progressing

## 2020-01-09 NOTE — Tx Team (Signed)
Interdisciplinary Treatment and Diagnostic Plan Update  01/09/2020 Time of Session: 8:30AM Stephanie [name] MRN: [id number]  Principal Diagnosis: Bipolar 1 disorder, manic, moderate (HCC)  Secondary Diagnoses: Principal Problem:   Bipolar 1 disorder, manic, moderate ([place]) Active Problems:   H/O gastrointestinal disease   GAD (generalized anxiety disorder)   Insomnia, persistent   Current Medications:  Current Facility-Administered Medications  Medication Dose Route Frequency Provider Last Rate Last Admin  . acetaminophen (TYLENOL) tablet 500 mg  500 mg Oral Q8H PRN Salley Scarlet, MD   500 mg at 01/09/20 0331  . alum & mag hydroxide-simeth (MAALOX/MYLANTA) 200-200-20 MG/5ML suspension 30 mL  30 mL Oral Q4H PRN Clapacs, John T, MD      . aspirin chewable tablet 81 mg  81 mg Oral Q breakfast Clapacs, Madie Reno, MD   81 mg at 01/09/20 0844  . calcium-vitamin D (OSCAL WITH D) 500-200 MG-UNIT per tablet 0.5 tablet  0.5 tablet Oral Q breakfast Salley Scarlet, MD   0.5 tablet at 01/09/20 0845  . carbamazepine (TEGRETOL XR) 12 hr tablet 400 mg  400 mg Oral BID Clapacs, Madie Reno, MD   400 mg at 01/09/20 0845  . divalproex (DEPAKOTE ER) 24 hr tablet 500 mg  500 mg Oral BID Clapacs, Madie Reno, MD   500 mg at 01/09/20 0844  . famotidine (PEPCID) tablet 10 mg  10 mg Oral Daily Salley Scarlet, MD   10 mg at 01/09/20 0844  . fluticasone (FLONASE) 50 MCG/ACT nasal spray 2 spray  2 spray Each Nare Daily Clapacs, Madie Reno, MD   2 spray at 01/07/20 0900  . folic acid (FOLVITE) tablet 2 mg  2 mg Oral Q breakfast Clapacs, Madie Reno, MD   2 mg at 01/09/20 0844  . gabapentin (NEURONTIN) capsule 300 mg  300 mg Oral BID Clapacs, Madie Reno, MD   300 mg at 01/09/20 0844  . hydrALAZINE (APRESOLINE) tablet 25 mg  25 mg Oral Q6H PRN Salley Scarlet, MD   25 mg at 01/03/20 3335  . levothyroxine (SYNTHROID) tablet 75 mcg  75 mcg Oral Q0600 Salley Scarlet, MD   75 mcg at 01/09/20 0530  . LORazepam (ATIVAN) tablet 0.5 mg  0.5  mg Oral Q8H PRN Salley Scarlet, MD      . magnesium hydroxide (MILK OF MAGNESIA) suspension 30 mL  30 mL Oral Daily PRN Clapacs, John T, MD      . menthol-cetylpyridinium (CEPACOL) lozenge 3 mg  1 lozenge Oral PRN Larita Fife, MD      . methotrexate (RHEUMATREX) tablet 15 mg  15 mg Oral Q Wed Clapacs, Madie Reno, MD   15 mg at 01/03/20 1224  . mirabegron ER (MYRBETRIQ) tablet 50 mg  50 mg Oral Daily Salley Scarlet, MD   50 mg at 01/09/20 0845  . multivitamin with minerals tablet 1 tablet  1 tablet Oral Q breakfast Clapacs, Madie Reno, MD   1 tablet at 01/09/20 0844  . naproxen (NAPROSYN) tablet 500 mg  500 mg Oral BID PRN Salley Scarlet, MD   500 mg at 01/09/20 0332  . nitrofurantoin (macrocrystal-monohydrate) (MACROBID) capsule 100 mg  100 mg Oral Q12H Salley Scarlet, MD   100 mg at 01/09/20 0846  . phenylephrine-shark liver oil-mineral oil-petrolatum (PREPARATION H) rectal ointment 1 application  1 application Rectal BID PRN Salley Scarlet, MD   1 application at 45/62/56 1722  . pravastatin (PRAVACHOL) tablet 20 mg  20 mg  Oral q1800 Clapacs, Madie Reno, MD   20 mg at 01/08/20 1705  . QUEtiapine (SEROQUEL XR) 24 hr tablet 50 mg  50 mg Oral QHS Larita Fife, MD   50 mg at 01/08/20 2007  . temazepam (RESTORIL) capsule 7.5 mg  7.5 mg Oral QHS Larita Fife, MD   7.5 mg at 01/08/20 2007   PTA Medications: Medications Prior to Admission  Medication Sig Dispense Refill Last Dose  . Abatacept (ORENCIA IV) Inject 1 Dose into the vein every 30 (thirty) days.     . Acetaminophen 500 MG capsule Take by mouth.     Marland Kitchen aspirin 81 MG chewable tablet Chew 81 mg by mouth daily with breakfast.     . Calcium Citrate-Vitamin D ([employer] CALCIUM CITRATE +D3 MINI) 200-250 MG-UNIT TABS Take 1 tablet by mouth daily after breakfast.      . carbamazepine (TEGRETOL XR) 200 MG 12 hr tablet Take 2 tablets (400 mg total) by mouth 2 (two) times daily. 360 tablet 1   . cariprazine (VRAYLAR) capsule Take 1 capsule (1.5 mg total) by  mouth daily. At bedtime 30 capsule 1   . divalproex (DEPAKOTE ER) 500 MG 24 hr tablet Take 1 tablet twice a day (Patient taking differently: Take 500 mg by mouth 2 (two) times daily. Take 1 tablet twice a day) 180 tablet 1   . eszopiclone (LUNESTA) 1 MG TABS tablet Take 1-2 tablets (1-2 mg total) by mouth at bedtime as needed for sleep. Take immediately before bedtime 15 tablet 1   . fluticasone (FLONASE) 50 MCG/ACT nasal spray      . folic acid (FOLVITE) 1 MG tablet Take 2 mg by mouth daily with breakfast.      . gabapentin (NEURONTIN) 300 MG capsule Take 1 tablet twice a day (Patient taking differently: Take 300 mg by mouth 2 (two) times daily. Take 1 tablet twice a day) 180 capsule 1   . ibandronate (BONIVA) 150 MG tablet      . levothyroxine (SYNTHROID, LEVOTHROID) 75 MCG tablet Take 75 mcg by mouth daily before breakfast.      . methotrexate 2.5 MG tablet Take 15 mg by mouth every Wednesday. WEDNESDAYS AT LUNCH     . Multiple Vitamins-Minerals (MULTIVITAMIN WITH MINERALS) tablet Take 1 tablet by mouth daily with breakfast.      . MYRBETRIQ 25 MG TB24 tablet Take 25 mg by mouth daily.      . pravastatin (PRAVACHOL) 40 MG tablet Take 40 mg by mouth daily.        Patient Stressors: Financial difficulties Health problems  Patient Strengths: Agricultural engineer for treatment/growth  Treatment Modalities: Medication Management, Group therapy, Case management,  1 to 1 session with clinician, Psychoeducation, Recreational therapy.   Physician Treatment Plan for Primary Diagnosis: Bipolar 1 disorder, manic, moderate (HCC) Long Term Goal(s): Improvement in symptoms so as ready for discharge Improvement in symptoms so as ready for discharge   Short Term Goals: Ability to identify changes in lifestyle to reduce recurrence of condition will improve Ability to verbalize feelings will improve Ability to disclose and discuss suicidal ideas Ability to demonstrate self-control will  improve Ability to identify and develop effective coping behaviors will improve Ability to identify changes in lifestyle to reduce recurrence of condition will improve Ability to verbalize feelings will improve Ability to disclose and discuss suicidal ideas Ability to demonstrate self-control will improve Ability to identify and develop effective coping behaviors will improve  Medication Management: Evaluate patient's response, side  effects, and tolerance of medication regimen.  Therapeutic Interventions: 1 to 1 sessions, Unit Group sessions and Medication administration.  Evaluation of Outcomes: Progressing  Physician Treatment Plan for Secondary Diagnosis: Principal Problem:   Bipolar 1 disorder, manic, moderate (HCC) Active Problems:   H/O gastrointestinal disease   GAD (generalized anxiety disorder)   Insomnia, persistent  Long Term Goal(s): Improvement in symptoms so as ready for discharge Improvement in symptoms so as ready for discharge   Short Term Goals: Ability to identify changes in lifestyle to reduce recurrence of condition will improve Ability to verbalize feelings will improve Ability to disclose and discuss suicidal ideas Ability to demonstrate self-control will improve Ability to identify and develop effective coping behaviors will improve Ability to identify changes in lifestyle to reduce recurrence of condition will improve Ability to verbalize feelings will improve Ability to disclose and discuss suicidal ideas Ability to demonstrate self-control will improve Ability to identify and develop effective coping behaviors will improve     Medication Management: Evaluate patient's response, side effects, and tolerance of medication regimen.  Therapeutic Interventions: 1 to 1 sessions, Unit Group sessions and Medication administration.  Evaluation of Outcomes: Progressing   RN Treatment Plan for Primary Diagnosis: Bipolar 1 disorder, manic, moderate ([place]) Long  Term Goal(s): Knowledge of disease and therapeutic regimen to maintain health will improve  Short Term Goals: Ability to verbalize frustration and anger appropriately will improve, Ability to demonstrate self-control, Ability to participate in decision making will improve, Ability to verbalize feelings will improve, Ability to identify and develop effective coping behaviors will improve and Compliance with prescribed medications will improve  Medication Management: RN will administer medications as ordered by provider, will assess and evaluate patient's response and provide education to patient for prescribed medication. RN will report any adverse and/or side effects to prescribing provider.  Therapeutic Interventions: 1 on 1 counseling sessions, Psychoeducation, Medication administration, Evaluate responses to treatment, Monitor vital signs and CBGs as ordered, Perform/monitor CIWA, COWS, AIMS and Fall Risk screenings as ordered, Perform wound care treatments as ordered.  Evaluation of Outcomes: Progressing   LCSW Treatment Plan for Primary Diagnosis: Bipolar 1 disorder, manic, moderate ([place]) Long Term Goal(s): Safe transition to appropriate next level of care at discharge, Engage patient in therapeutic group addressing interpersonal concerns.  Short Term Goals: Engage patient in aftercare planning with referrals and resources, Increase social support, Increase ability to appropriately verbalize feelings, Increase emotional regulation, Facilitate acceptance of mental health diagnosis and concerns, Identify triggers associated with mental health/substance abuse issues and Increase skills for wellness and recovery  Therapeutic Interventions: Assess for all discharge needs, 1 to 1 time with Social worker, Explore available resources and support systems, Assess for adequacy in community support network, Educate family and significant other(s) on suicide prevention, Complete Psychosocial Assessment,  Interpersonal group therapy.  Evaluation of Outcomes: Progressing   Progress in Treatment: Attending groups: Yes. Participating in groups: Yes. Taking medication as prescribed: Yes. Toleration medication: Yes. Family/Significant other contact made: Yes, individual(s) contacted:  SPE completed with daughter, Nilda Simmer. Patient understands diagnosis: Yes. Discussing patient identified problems/goals with staff: Yes. Medical problems stabilized or resolved: Yes. Denies suicidal/homicidal ideation: Yes. Issues/concerns per patient self-inventory: No. Other: None.  New problem(s) identified: No, Describe:  None.  New Short Term/Long Term Goal(s): medication management for mood stabilization; development of comprehensive mental wellness/sobriety plan. Update 01/09/20: No changes currently.  Patient Goals: "To get better sleep." Update 01/09/20: No changes currently.  Discharge Plan or Barriers: CSW will assist  pt with development of aftercare plan. Update 01/09/20:  Pt plans to return home with outpatient treatment through [facility] [place] (Dr Shea Evans) along with outpatient OT.  Reason for Continuation of Hospitalization: [place] Medical Issues Medication stabilization  Estimated Length of Stay: 1-7 days  Attendees: Patient: 01/09/2020 9:29 AM  Physician: Selina Cooley, MD 01/09/2020 9:29 AM  Nursing: 01/09/2020 9:29 AM  RN Care Manager: 01/09/2020 9:29 AM  Social Worker: Assunta Curtis, MSW, LCSW 01/09/2020 9:29 AM  Recreational Therapist: 01/09/2020 9:29 AM  Other: Chalmers Guest. Guerry Bruin, MSW, [place], [place] 01/09/2020 9:29 AM  Other:  01/09/2020 9:29 AM  Other: 01/09/2020 9:29 AM    Scribe for Treatment Team: Shirl Harris, LCSW 01/09/2020 9:29 AM

## 2020-01-09 NOTE — Progress Notes (Signed)
Occupational Therapy Treatment Patient Details Name: Stephanie [name] MRN: [id number] DOB: [birth year] Today's Date: 01/09/2020    History of present illness Stephanie [name] is a 19yoF who comes to [facility]/02/20 at requrest of her psychaitrist due to 4 days of sleeplessness and manic episodes. PMH:  GERD, fibromyalgia, IBS, Sjogren's disease, RA, OSA, CAD, depression, and BPD. Pt was hoping to again start OPOT soon for her RA of hands.   OT comments  [name] was seen for OT treatment on this date. Upon arrival to room pt seated EOB completing paperwork. Pt expresses gratitude that she can now Independently flush toilet and requires MIN VCs to provide examples of other funcitonal applications of joint protection strategies. OT intructed pt in B wrist/digit AROM HEP, pain mgmt, and AE for IADLs. Pt completed 1 set x 10 reps each of wrist flexion/extension/ulnar deviation and digit flexion/extension. Pt required MIN VCs for technique to safely demonstrate exercises. Pt given handout for AROM and joint protection strategies. Pt making good progress toward goals. Will sign off as pt is Independent for ADLs. Recommend Outpatient OT post discharge.    Follow Up Recommendations  Outpatient OT    Equipment Recommendations  None recommended by OT    Recommendations for Other Services      Precautions / Restrictions Precautions Precautions: Fall Restrictions Weight Bearing Restrictions: No       Mobility Bed Mobility Overal bed mobility: Independent       Transfers             ADL either performed or assessed with clinical judgement   ADL Overall ADL's : Modified independent       General ADL Comments: Pt reports MOD I for toileting now using joint protection strategies + RW. Return verbalizes joint protection stratgies for safe IADL mgmt.                Cognition Arousal/Alertness: Awake/alert Behavior During Therapy: WFL for tasks  assessed/performed;Restless Overall Cognitive Status: Within Functional Limits for tasks assessed           Exercises Exercises: Other exercises Other Exercises Other Exercises: Pt educated re: D/c recs, joint protection strategies, HEP (B wrist/digit AROM), pain mgmt, AE for IADLs Other Exercises: Pt return deomstrated BUE AROM exercises in sitting. MIN VCs for technique to safely demonstrate exercises.            Pertinent Vitals/ Pain       Pain Assessment: No/denies pain         Frequency  Min 1X/week        Progress Toward Goals  OT Goals(current goals can now be found in the care plan section)  Progress towards OT goals: Progressing toward goals  Acute Rehab OT Goals Patient Stated Goal: be as independent as possible, avoid relying on others too much. OT Goal Formulation: With patient Time For Goal Achievement: 01/19/20 Potential to Achieve Goals: Good ADL Goals Pt Will Perform Tub/Shower Transfer: with modified independence;shower seat;rolling walker Additional ADL Goal #1: Pt will Independently verbalize plan to implement x3 ECS Additional ADL Goal #2: Pt will Indepedently verbalize plan to implement x3 falls prevention strategies  Plan Discharge plan remains appropriate;All goals met and education completed, patient discharged from [place] OT "6 Clicks" Daily Activity     Outcome Measure   Help from another person eating meals?: None Help from another person taking care of personal grooming?: None Help from another person toileting, which  includes using toliet, bedpan, or urinal?: None Help from another person bathing (including washing, rinsing, drying)?: None Help from another person to put on and taking off regular upper body clothing?: None Help from another person to put on and taking off regular lower body clothing?: None 6 Click Score: 24    End of Session    OT Visit Diagnosis: Other abnormalities of gait and mobility  (R26.89)   Activity Tolerance Patient tolerated treatment well   Patient Left in bed   Nurse Communication          Time: 7672-0947 OT Time Calculation (min): 24 min  Charges: OT General Charges $OT Visit: 1 Visit OT Treatments $Self Care/Home Management : 8-22 mins $Therapeutic Exercise: 8-22 mins  Dessie Coma, M.S. OTR/L  01/09/20, 10:09 AM  ascom [phone]

## 2020-01-09 NOTE — Telephone Encounter (Signed)
Already done

## 2020-01-09 NOTE — Progress Notes (Signed)
[name] MD Progress Note  01/09/2020 12:13 PM Stephanie [name]  MRN:  [id number]  Principal Problem: Bipolar 1 disorder, manic, moderate (HCC) Diagnosis: Principal Problem:   Bipolar 1 disorder, manic, moderate ([place]) Active Problems:   H/O gastrointestinal disease   GAD (generalized anxiety disorder)   Insomnia, persistent  Stephanie [name] is a 74 y.o. female who presents to the [facility] unit for treatment of bipolar disorder n the context of current manic episode.   Interval History Patient was seen today for re-evaluation.  Nursing reports no events overnight. The patient has no issues with performing ADLs.  Patient has been medication compliant.    Subjective:  On assessment patient reports she managed to shower today on her own using a shower chair. She feels her UTI symptoms of dysuria have improved this morning. She also feels less anxious overall, and has noticed her IBS symptoms have improved with decreased anxiety. Today she is no longer ruminating about her lost belongings. However, she remains fairly tangential on exam with pressured speech. Her sleep has improved with Seroquel 50 mg and Temazepam 7.5 mg QHS. She is starting to become more future-oriented. She is hoping to have a rolling walker and home health at discharge, and be able to see outpatient psychiatrist in person. She repors "good mood" overall; she is not depressed and admits feeling "less manic", calmer. No unsafe thoughts.  Current suicidal/homicidal ideations: Denies Current auditory/visual hallucinations: Denies The patient reports no side effects from medications.    Labs: no new results for review.     Total Time spent with patient: 45 min  Past Psychiatric History: see H&P  Past Medical History:  Past Medical History:  Diagnosis Date  . Anginal pain ([place])   . Anxiety   . Bipolar 1 disorder ([place])   . Bipolar disorder ([place])   . Coronary artery disease   . Depression   . Fatigue   . Fibromyalgia   . GERD  (gastroesophageal reflux disease)   . Heart disease   . Heart failure ([place])   . HLD (hyperlipidemia)   . HOH (hard of hearing)   . Hypothyroidism   . IBS (irritable bowel syndrome)   . Pelvic pain in female   . Perianal lesion    high grade sil lesion- keratinizing type  . RA (rheumatoid arthritis) ([place])   . Raynaud phenomenon   . Sjogren's disease ([place])   . Sleep apnea   . Vaginal Pap smear, abnormal     Past Surgical History:  Procedure Laterality Date  . CATARACT EXTRACTION W/PHACO Left 02/23/2017   Procedure: CATARACT EXTRACTION PHACO AND INTRAOCULAR LENS PLACEMENT (IOC);  Surgeon: Birder Robson, MD;  Location: [facility] ORS;  Service: Ophthalmology;  Laterality: Left;  [place] 00:35 AP% 9.6 CDE 3.47 Fluid pack lot # [id number]  . CATARACT EXTRACTION W/PHACO Right 03/30/2017   Procedure: CATARACT EXTRACTION PHACO AND INTRAOCULAR LENS PLACEMENT ([place]);  Surgeon: Birder Robson, MD;  Location: [facility] ORS;  Service: Ophthalmology;  Laterality: Right;  [place] 00:32.6 AP% 13.8 CDE 4.51 Fluid Pack Lot # [id number]  . EYE SURGERY    . TONSILLECTOMY     Family History:  Family History  Problem Relation Age of Onset  . Heart failure Father   . Parkinson's disease Mother   . Bipolar disorder Mother   . Depression Sister   . Schizophrenia Paternal Grandmother   . Diabetes Maternal Grandmother   . Pancreatic cancer Maternal Grandmother   . Cancer Neg Hx   . Heart disease  Neg Hx   . Breast cancer Neg Hx    Family Psychiatric  History: see H&P  Social History:  Social History   Substance and Sexual Activity  Alcohol Use No  . Alcohol/week: 0.0 standard drinks     Social History   Substance and Sexual Activity  Drug Use No    Social History   Socioeconomic History  . Marital status: Divorced    Spouse name: Not on file  . Number of children: 2  . Years of education: Not on file  . Highest education level: Master's degree (e.g., MA, MS, MEng, MEd, MSW, MBA)  Occupational  History    Comment: retired2  Tobacco Use  . Smoking status: Never Smoker  . Smokeless tobacco: Former Network engineer  . Vaping Use: Never used  Substance and Sexual Activity  . Alcohol use: No    Alcohol/week: 0.0 standard drinks  . Drug use: No  . Sexual activity: Never    Birth control/protection: Post-menopausal  Other Topics Concern  . Not on file  Social History Narrative  . Not on file   Social Determinants of Health   Financial Resource Strain:   . Difficulty of Paying Living Expenses: Not on file  Food Insecurity:   . Worried About Charity fundraiser in the Last Year: Not on file  . Ran Out of Food in the Last Year: Not on file  Transportation Needs:   . Lack of Transportation (Medical): Not on file  . Lack of Transportation (Non-Medical): Not on file  Physical Activity:   . Days of Exercise per Week: Not on file  . Minutes of Exercise per Session: Not on file  Stress:   . Feeling of Stress : Not on file  Social Connections:   . Frequency of Communication with Friends and Family: Not on file  . Frequency of Social Gatherings with Friends and Family: Not on file  . Attends Religious Services: Not on file  . Active Member of Clubs or Organizations: Not on file  . Attends Archivist Meetings: Not on file  . Marital Status: Not on file   Additional Social History:                         Sleep: Good  Appetite:  Good  Current Medications: Current Facility-Administered Medications  Medication Dose Route Frequency Provider Last Rate Last Admin  . acetaminophen (TYLENOL) tablet 500 mg  500 mg Oral Q8H PRN Salley Scarlet, MD   500 mg at 01/09/20 0331  . alum & mag hydroxide-simeth (MAALOX/MYLANTA) 200-200-20 MG/5ML suspension 30 mL  30 mL Oral Q4H PRN Clapacs, John T, MD      . aspirin chewable tablet 81 mg  81 mg Oral Q breakfast Clapacs, Madie Reno, MD   81 mg at 01/09/20 0844  . calcium-vitamin D (OSCAL WITH D) 500-200 MG-UNIT per tablet  0.5 tablet  0.5 tablet Oral Q breakfast Salley Scarlet, MD   0.5 tablet at 01/09/20 0845  . carbamazepine (TEGRETOL XR) 12 hr tablet 400 mg  400 mg Oral BID Clapacs, Madie Reno, MD   400 mg at 01/09/20 0845  . divalproex (DEPAKOTE ER) 24 hr tablet 500 mg  500 mg Oral BID Clapacs, Madie Reno, MD   500 mg at 01/09/20 0844  . famotidine (PEPCID) tablet 10 mg  10 mg Oral Daily Salley Scarlet, MD   10 mg at 01/09/20 0844  .  fluticasone (FLONASE) 50 MCG/ACT nasal spray 2 spray  2 spray Each Nare Daily Clapacs, Madie Reno, MD   2 spray at 01/07/20 0900  . folic acid (FOLVITE) tablet 2 mg  2 mg Oral Q breakfast Clapacs, Madie Reno, MD   2 mg at 01/09/20 0844  . gabapentin (NEURONTIN) capsule 300 mg  300 mg Oral BID Clapacs, Madie Reno, MD   300 mg at 01/09/20 0844  . hydrALAZINE (APRESOLINE) tablet 25 mg  25 mg Oral Q6H PRN Salley Scarlet, MD   25 mg at 01/03/20 2355  . levothyroxine (SYNTHROID) tablet 75 mcg  75 mcg Oral Q0600 Salley Scarlet, MD   75 mcg at 01/09/20 0530  . LORazepam (ATIVAN) tablet 0.5 mg  0.5 mg Oral Q8H PRN Salley Scarlet, MD      . magnesium hydroxide (MILK OF MAGNESIA) suspension 30 mL  30 mL Oral Daily PRN Clapacs, John T, MD      . menthol-cetylpyridinium (CEPACOL) lozenge 3 mg  1 lozenge Oral PRN Larita Fife, MD      . methotrexate (RHEUMATREX) tablet 15 mg  15 mg Oral Q Wed Clapacs, Madie Reno, MD   15 mg at 01/03/20 1224  . mirabegron ER (MYRBETRIQ) tablet 50 mg  50 mg Oral Daily Salley Scarlet, MD   50 mg at 01/09/20 0845  . multivitamin with minerals tablet 1 tablet  1 tablet Oral Q breakfast Clapacs, Madie Reno, MD   1 tablet at 01/09/20 0844  . naproxen (NAPROSYN) tablet 500 mg  500 mg Oral BID PRN Salley Scarlet, MD   500 mg at 01/09/20 0332  . nitrofurantoin (macrocrystal-monohydrate) (MACROBID) capsule 100 mg  100 mg Oral Q12H Salley Scarlet, MD   100 mg at 01/09/20 0846  . phenylephrine-shark liver oil-mineral oil-petrolatum (PREPARATION H) rectal ointment 1 application  1  application Rectal BID PRN Salley Scarlet, MD   1 application at 73/22/02 1722  . pravastatin (PRAVACHOL) tablet 20 mg  20 mg Oral q1800 Clapacs, Madie Reno, MD   20 mg at 01/08/20 1705  . QUEtiapine (SEROQUEL XR) 24 hr tablet 50 mg  50 mg Oral QHS Larita Fife, MD   50 mg at 01/08/20 2007  . temazepam (RESTORIL) capsule 7.5 mg  7.5 mg Oral QHS Larita Fife, MD   7.5 mg at 01/08/20 2007    Lab Results:  Results for orders placed or performed during the hospital encounter of 01/02/20 (from the past 48 hour(s))  Urinalysis, Complete w Microscopic     Status: Abnormal   Collection Time: 01/07/20  4:18 PM  Result Value Ref Range   Color, Urine YELLOW (A) YELLOW   APPearance HAZY (A) CLEAR   Specific Gravity, Urine 1.009 1.005 - 1.030   pH 7.0 5.0 - 8.0   Glucose, UA NEGATIVE NEGATIVE mg/dL   Hgb urine dipstick MODERATE (A) NEGATIVE   Bilirubin Urine NEGATIVE NEGATIVE   Ketones, ur NEGATIVE NEGATIVE mg/dL   Protein, ur 30 (A) NEGATIVE mg/dL   Nitrite NEGATIVE NEGATIVE   Leukocytes,Ua MODERATE (A) NEGATIVE   RBC / HPF 21-50 0 - 5 RBC/hpf   WBC, UA >50 (H) 0 - 5 WBC/hpf   Bacteria, UA NONE SEEN NONE SEEN   Squamous Epithelial / LPF 0-5 0 - 5   Mucus PRESENT     Comment: Performed at [facility], [address] [place], [place] [address]    Blood Alcohol level:  Lab Results  Component Value Date  ETH <10 01/02/2020   ETH <5 25/85/2778    Metabolic Disorder Labs: Lab Results  Component Value Date   HGBA1C 5.3 11/22/2017   Lab Results  Component Value Date   PROLACTIN 33.0 (H) 11/22/2017   Lab Results  Component Value Date   CHOL 222 (H) 01/02/2020   TRIG 190 (H) 01/02/2020   HDL 69 01/02/2020   CHOLHDL 3.2 01/02/2020   VLDL 38 01/02/2020   LDLCALC 115 (H) 01/02/2020   LDLCALC 119 (H) 11/22/2017    Physical Findings: AIMS: Facial and Oral Movements Muscles of Facial Expression: None, normal Lips and Perioral Area: None, normal Jaw: None, normal Tongue:  None, normal,Extremity Movements Upper (arms, wrists, hands, fingers): None, normal Lower (legs, knees, ankles, toes): None, normal, Trunk Movements Neck, shoulders, hips: None, normal, Overall Severity Severity of abnormal movements (highest score from questions above): None, normal Incapacitation due to abnormal movements: None, normal Patient's awareness of abnormal movements (rate only patient's report): No Awareness, Dental Status Current problems with teeth and/or dentures?: No Does patient usually wear dentures?: No  CIWA:  CIWA-Ar Total: 1 COWS:  COWS Total Score: 2  Musculoskeletal: Strength & Muscle Tone: within normal limits Gait & Station: normal Patient leans: N/A  Psychiatric Specialty Exam: Physical Exam Vitals and nursing note reviewed.  Constitutional:      Appearance: Normal appearance.  HENT:     Head: Normocephalic and atraumatic.     Right Ear: External ear normal.     Left Ear: External ear normal.     Nose: Nose normal.     Mouth/Throat:     Mouth: Mucous membranes are moist.     Pharynx: Oropharynx is clear.  Eyes:     Extraocular Movements: Extraocular movements intact.     Conjunctiva/sclera: Conjunctivae normal.     Pupils: Pupils are equal, round, and reactive to light.  Cardiovascular:     Rate and Rhythm: Normal rate.     Pulses: Normal pulses.  Pulmonary:     Effort: Pulmonary effort is normal.     Breath sounds: Normal breath sounds.  Abdominal:     General: Abdomen is flat.     Palpations: Abdomen is soft.  Musculoskeletal:        General: Normal range of motion.     Cervical back: Normal range of motion and neck supple.     Comments: Rheumatoid arthritis in bilateral fingers and toes  Skin:    General: Skin is warm and dry.  Neurological:     General: No focal deficit present.     Mental Status: She is alert and oriented to person, place, and time.  Psychiatric:        Attention and Perception: Attention normal.        Mood and  Affect: Mood is anxious.        Speech: Speech is rapid and pressured.        Behavior: Behavior is cooperative.        Thought Content: Thought content normal.        Cognition and Memory: Cognition and memory normal.        Judgment: Judgment normal.     Review of Systems  Constitutional: Positive for appetite change. Negative for fatigue.  HENT: Positive for ear pain and sore throat.   Eyes: Negative for photophobia and visual disturbance.  Respiratory: Negative for cough and shortness of breath.   Cardiovascular: Negative for chest pain and palpitations.  Gastrointestinal: Negative for constipation, diarrhea, nausea  and vomiting.  Endocrine: Negative for cold intolerance and heat intolerance.  Genitourinary: Positive for urgency. Negative for dysuria.  Musculoskeletal: Negative for arthralgias and myalgias.  Skin: Negative for rash and wound.  Allergic/Immunologic: Negative for food allergies and immunocompromised state.  Neurological: Negative for dizziness and headaches.  Hematological: Negative for adenopathy. Does not bruise/bleed easily.  Psychiatric/Behavioral: Positive for sleep disturbance. Negative for hallucinations and suicidal ideas. The patient is nervous/anxious.     Blood pressure 119/70, pulse 75, temperature 97.9 F (36.6 C), temperature source Oral, resp. rate 18, height 5' (1.524 m), weight 48.1 kg, SpO2 97 %.Body mass index is 20.7 kg/m.  General Appearance: Casual and Fairly Groomed  Eye Contact:  Good  Speech:  Pressured  Volume:  Normal  Mood:  Irritable  Affect:  Congruent  Thought Process:  Coherent and Goal Directed  Orientation:  Full (Time, Place, and Person)  Thought Content:  Logical, Rumination and Tangential  Suicidal Thoughts:  No  Homicidal Thoughts:  No  Memory:  Immediate;   Fair Recent;   Fair Remote;   Fair  Judgement:  Fair  Insight:  Shallow  Psychomotor Activity:  Normal  Concentration:  Concentration: Fair and Attention Span:  Fair  Recall:  [employer] of Knowledge:  Fair  Language:  Fair  Akathisia:  No  Handed:  Right  AIMS (if indicated):     Assets:  Communication Skills Desire for Improvement Housing Social Support  ADL's:  Intact  Cognition:  WNL  Sleep:  Number of Hours: 7.15     Treatment Plan Summary: Daily contact with patient to assess and evaluate symptoms and progress in treatment and Medication management  Patient is a 74 year old female with the above-stated past psychiatric history who is seen in follow-up.  Chart reviewed. Patient discussed with nursing. Patient appears to be improving compare to her presentation upon admission, although still expressing some hypomanic symptoms. Will continue current medicatrions without significant changes today. Diflucan 150 mg one time dose for yeast infection, pepcid 10 mg daily for GERD, macrobid 100 mg BID for 5 days for UTI.    Plan:  -continue inpatient psych admission; 15-minute checks; daily contact with patient to assess and evaluate symptoms and progress in treatment; psychoeducation.  -continue scheduled medications: . aspirin  81 mg Oral Q breakfast  . calcium-vitamin D  0.5 tablet Oral Q breakfast  . carbamazepine  400 mg Oral BID  . divalproex  500 mg Oral BID  . famotidine  10 mg Oral Daily  . fluticasone  2 spray Each Nare Daily  . folic acid  2 mg Oral Q breakfast  . gabapentin  300 mg Oral BID  . levothyroxine  75 mcg Oral Q0600  . methotrexate  15 mg Oral Q Wed  . mirabegron ER  50 mg Oral Daily  . multivitamin with minerals  1 tablet Oral Q breakfast  . nitrofurantoin (macrocrystal-monohydrate)  100 mg Oral Q12H  . pravastatin  20 mg Oral q1800  . QUEtiapine  50 mg Oral QHS  . temazepam  7.5 mg Oral QHS   -continue PRN medications.  acetaminophen, alum & mag hydroxide-simeth, hydrALAZINE, LORazepam, magnesium hydroxide, menthol-cetylpyridinium, naproxen, phenylephrine-shark liver oil-mineral oil-petrolatum  -Pertinent  Labs: new order placed for repeat UA and urine culture to ensure infection has cleared  -EKG: on 10/26 showed Qtc 425 with normal sinus rhythm    -Consults: No new consults placed since yesterday    -Disposition: Estimated duration of hospitalization: midweek next week. All  necessary aftercare will be arranged prior to discharge Likely d/c home with outpatient psych follow-up.  -  I certify that the patient does need, on a daily basis, active treatment furnished directly by or requiring the supervision of inpatient psychiatric facility personnel.   Salley Scarlet, MD 01/09/2020, 12:13 PM

## 2020-01-10 DIAGNOSIS — F3112 Bipolar disorder, current episode manic without psychotic features, moderate: Secondary | ICD-10-CM | POA: Diagnosis not present

## 2020-01-10 MED ORDER — NITROFURANTOIN MONOHYD MACRO 100 MG PO CAPS: 100.0000 mg | ORAL_CAPSULE | Freq: Two times a day (BID) | ORAL | 0 refills | Status: DC

## 2020-01-10 MED ORDER — TEMAZEPAM 7.5 MG PO CAPS: 7.5000 mg | ORAL_CAPSULE | Freq: Every day | ORAL | 1 refills | Status: DC

## 2020-01-10 MED ORDER — QUETIAPINE FUMARATE ER 50 MG PO TB24: 50.0000 mg | ORAL_TABLET | Freq: Every day | ORAL | 1 refills | Status: DC

## 2020-01-10 MED ORDER — NAPHAZOLINE-PHENIRAMINE 0.025-0.3 % OP SOLN
1.0000 [drp] | Freq: Four times a day (QID) | OPHTHALMIC | Status: DC | PRN
  Administered 2020-01-10 – 2020-01-11 (×2): 1 [drp] via OPHTHALMIC
  Filled 2020-01-10 (×2): qty 5

## 2020-01-10 NOTE — Progress Notes (Signed)
Patient calm and cooperative during assessment denying SI/HI/AVH, anxiety and depression. Patient stated that she was feeling a lot better than when she first got here and is ready to leave tomorrow. Patient observed interacting appropriately with staff and peers on the unit. Patient compliant with medication administration per MD orders. Pt given education, support, and encouragement to be active in her treatment plan. Patient being monitored Q 15 minutes for safety per unit protocol. Pt remains safe on the unit.

## 2020-01-10 NOTE — Progress Notes (Signed)
Patient alert and oriented x 4, affected is blunted, thoughts are organized , coherent and logical, she appears less anxious, less irritable and speech is not pressured . Patient is receptive to staff, and complaint with medication regimen, she currently denies SI/HI/AVH, on ABT/UTI afebrile 98.4 will continue to monitor closely.

## 2020-01-10 NOTE — Plan of Care (Signed)
Patient continues to improve and states she is ready to leave tomorrow  Problem: Coping: Goal: Level of anxiety will decrease Outcome: Progressing

## 2020-01-10 NOTE — Progress Notes (Signed)
Pt is alert and oriented to person, place, time and situation. Pt is hyperactive, hyperverbal, intrusive, and pt states, "I am manic." Pt denies suicidal and homicidal ideation, denies hallucinations, denies feelings of depression and anxiety. No distress noted, none reported. Will continue to monitor pt per Q15 minute face checks and monitor for safety and progress.

## 2020-01-10 NOTE — Progress Notes (Signed)
Recreation Therapy Notes   Date: 01/10/2020  Time: 9:30 am   Location: Craft room  Behavioral response: Appropriate  Intervention Topic: Problem Solving    Discussion/Intervention:  Group content on today was focused on problem solving. The group described what problem solving is. Patients expressed how problems affect them and how they deal with problems. Individuals identified healthy ways to deal with problems. Patients explained what normally happens to them when they do not deal with problems. The group expressed reoccurring problems for them. The group participated in the intervention "Put the story together" with their peers and worked together to put a story that was broken up in the correct order.  Clinical Observations/Feedback: Patient came to group and late and stated that lack of knowledge can stop people from problem solving. Individual left group early.  Lora Chavers LRT/CTRS         Evanell Redlich 01/10/2020 12:23 PM

## 2020-01-10 NOTE — Progress Notes (Signed)
[facility] MD Progress Note  01/10/2020 11:22 AM Stephanie [name]  MRN:  [id number]  Principal Problem: Bipolar 1 disorder, manic, moderate (HCC) Diagnosis: Principal Problem:   Bipolar 1 disorder, manic, moderate ([place]) Active Problems:   H/O gastrointestinal disease   GAD (generalized anxiety disorder)   Insomnia, persistent  Stephanie [name] is a 74 y.o. female who presents to the [facility] unit for treatment of bipolar disorder n the context of current manic episode.   Interval History Patient was seen today for re-evaluation.  Nursing reports no events overnight. The patient has no issues with performing ADLs.  Patient has been medication compliant.    Subjective:  She feels her UTI symptoms of dysuria have improved this morning, but still reports urgency and frequency. She also feels less anxious overall, and has noticed her IBS symptoms have improved with decreased anxiety. She remains fairly tangential on exam with pressured speech. Her sleep has improved with Seroquel 50 mg and Temazepam 7.5 mg QHS. She is starting to become more future-oriented. She is hoping to have a rolling walker and home health at discharge, and be able to see outpatient psychiatrist in person. She repors "good mood" overall; she is not depressed and admits feeling "less manic", calmer. No unsafe thoughts.  Current suicidal/homicidal ideations: Denies Current auditory/visual hallucinations: Denies The patient reports no side effects from medications.    Labs: no new results for review.     Total Time spent with patient: 45 min  Past Psychiatric History: see H&P  Past Medical History:  Past Medical History:  Diagnosis Date  . Anginal pain ([place])   . Anxiety   . Bipolar 1 disorder ([place])   . Bipolar disorder ([place])   . Coronary artery disease   . Depression   . Fatigue   . Fibromyalgia   . GERD (gastroesophageal reflux disease)   . Heart disease   . Heart failure ([place])   . HLD (hyperlipidemia)   . HOH (hard of  hearing)   . Hypothyroidism   . IBS (irritable bowel syndrome)   . Pelvic pain in female   . Perianal lesion    high grade sil lesion- keratinizing type  . RA (rheumatoid arthritis) ([place])   . Raynaud phenomenon   . Sjogren's disease ([place])   . Sleep apnea   . Vaginal Pap smear, abnormal     Past Surgical History:  Procedure Laterality Date  . CATARACT EXTRACTION W/PHACO Left 02/23/2017   Procedure: CATARACT EXTRACTION PHACO AND INTRAOCULAR LENS PLACEMENT (IOC);  Surgeon: Birder Robson, MD;  Location: [facility] ORS;  Service: Ophthalmology;  Laterality: Left;  [place] 00:35 AP% 9.6 CDE 3.47 Fluid pack lot # [id number]  . CATARACT EXTRACTION W/PHACO Right 03/30/2017   Procedure: CATARACT EXTRACTION PHACO AND INTRAOCULAR LENS PLACEMENT ([place]);  Surgeon: Birder Robson, MD;  Location: [facility] ORS;  Service: Ophthalmology;  Laterality: Right;  [place] 00:32.6 AP% 13.8 CDE 4.51 Fluid Pack Lot # [id number]  . EYE SURGERY    . TONSILLECTOMY     Family History:  Family History  Problem Relation Age of Onset  . Heart failure Father   . Parkinson's disease Mother   . Bipolar disorder Mother   . Depression Sister   . Schizophrenia Paternal Grandmother   . Diabetes Maternal Grandmother   . Pancreatic cancer Maternal Grandmother   . Cancer Neg Hx   . Heart disease Neg Hx   . Breast cancer Neg Hx    Family Psychiatric  History: see H&P  Social History:  Social History   Substance and Sexual Activity  Alcohol Use No  . Alcohol/week: 0.0 standard drinks     Social History   Substance and Sexual Activity  Drug Use No    Social History   Socioeconomic History  . Marital status: Divorced    Spouse name: Not on file  . Number of children: 2  . Years of education: Not on file  . Highest education level: Master's degree (e.g., MA, MS, MEng, MEd, MSW, MBA)  Occupational History    Comment: retired2  Tobacco Use  . Smoking status: Never Smoker  . Smokeless tobacco: Former Network engineer   . Vaping Use: Never used  Substance and Sexual Activity  . Alcohol use: No    Alcohol/week: 0.0 standard drinks  . Drug use: No  . Sexual activity: Never    Birth control/protection: Post-menopausal  Other Topics Concern  . Not on file  Social History Narrative  . Not on file   Social Determinants of Health   Financial Resource Strain:   . Difficulty of Paying Living Expenses: Not on file  Food Insecurity:   . Worried About Charity fundraiser in the Last Year: Not on file  . Ran Out of Food in the Last Year: Not on file  Transportation Needs:   . Lack of Transportation (Medical): Not on file  . Lack of Transportation (Non-Medical): Not on file  Physical Activity:   . Days of Exercise per Week: Not on file  . Minutes of Exercise per Session: Not on file  Stress:   . Feeling of Stress : Not on file  Social Connections:   . Frequency of Communication with Friends and Family: Not on file  . Frequency of Social Gatherings with Friends and Family: Not on file  . Attends Religious Services: Not on file  . Active Member of Clubs or Organizations: Not on file  . Attends Archivist Meetings: Not on file  . Marital Status: Not on file   Additional Social History:                         Sleep: Good  Appetite:  Good  Current Medications: Current Facility-Administered Medications  Medication Dose Route Frequency Provider Last Rate Last Admin  . acetaminophen (TYLENOL) tablet 500 mg  500 mg Oral Q8H PRN Salley Scarlet, MD   500 mg at 01/09/20 0331  . alum & mag hydroxide-simeth (MAALOX/MYLANTA) 200-200-20 MG/5ML suspension 30 mL  30 mL Oral Q4H PRN Clapacs, John T, MD      . aspirin chewable tablet 81 mg  81 mg Oral Q breakfast Clapacs, Madie Reno, MD   81 mg at 01/10/20 0839  . calcium-vitamin D (OSCAL WITH D) 500-200 MG-UNIT per tablet 0.5 tablet  0.5 tablet Oral Q breakfast Salley Scarlet, MD   0.5 tablet at 01/10/20 0836  . carbamazepine (TEGRETOL XR) 12  hr tablet 400 mg  400 mg Oral BID Clapacs, Madie Reno, MD   400 mg at 01/10/20 0837  . divalproex (DEPAKOTE ER) 24 hr tablet 500 mg  500 mg Oral BID Clapacs, Madie Reno, MD   500 mg at 01/10/20 0839  . famotidine (PEPCID) tablet 10 mg  10 mg Oral Daily Salley Scarlet, MD   10 mg at 01/10/20 0839  . fluticasone (FLONASE) 50 MCG/ACT nasal spray 2 spray  2 spray Each Nare Daily Clapacs, Madie Reno, MD   2 spray  at 01/07/20 0900  . folic acid (FOLVITE) tablet 2 mg  2 mg Oral Q breakfast Clapacs, Madie Reno, MD   2 mg at 01/10/20 0839  . gabapentin (NEURONTIN) capsule 300 mg  300 mg Oral BID Clapacs, Madie Reno, MD   300 mg at 01/10/20 0839  . hydrALAZINE (APRESOLINE) tablet 25 mg  25 mg Oral Q6H PRN Salley Scarlet, MD   25 mg at 01/03/20 5009  . levothyroxine (SYNTHROID) tablet 75 mcg  75 mcg Oral Q0600 Salley Scarlet, MD   75 mcg at 01/10/20 0631  . LORazepam (ATIVAN) tablet 0.5 mg  0.5 mg Oral Q8H PRN Salley Scarlet, MD      . magnesium hydroxide (MILK OF MAGNESIA) suspension 30 mL  30 mL Oral Daily PRN Clapacs, John T, MD      . menthol-cetylpyridinium (CEPACOL) lozenge 3 mg  1 lozenge Oral PRN Larita Fife, MD      . methotrexate (RHEUMATREX) tablet 15 mg  15 mg Oral Q Wed Clapacs, Madie Reno, MD   15 mg at 01/03/20 1224  . mirabegron ER (MYRBETRIQ) tablet 50 mg  50 mg Oral Daily Salley Scarlet, MD   50 mg at 01/10/20 3818  . multivitamin with minerals tablet 1 tablet  1 tablet Oral Q breakfast Clapacs, Madie Reno, MD   1 tablet at 01/10/20 0839  . naphazoline-pheniramine (NAPHCON-A) 0.025-0.3 % ophthalmic solution 1 drop  1 drop Both Eyes QID PRN Salley Scarlet, MD      . naproxen (NAPROSYN) tablet 500 mg  500 mg Oral BID PRN Salley Scarlet, MD   500 mg at 01/10/20 0934  . nitrofurantoin (macrocrystal-monohydrate) (MACROBID) capsule 100 mg  100 mg Oral Q12H Salley Scarlet, MD   100 mg at 01/10/20 0841  . phenylephrine-shark liver oil-mineral oil-petrolatum (PREPARATION H) rectal ointment 1 application  1  application Rectal BID PRN Salley Scarlet, MD   1 application at 29/93/71 1722  . pravastatin (PRAVACHOL) tablet 20 mg  20 mg Oral q1800 Clapacs, Madie Reno, MD   20 mg at 01/09/20 1725  . QUEtiapine (SEROQUEL XR) 24 hr tablet 50 mg  50 mg Oral QHS Larita Fife, MD   50 mg at 01/09/20 2001  . temazepam (RESTORIL) capsule 7.5 mg  7.5 mg Oral QHS Larita Fife, MD   7.5 mg at 01/09/20 2001    Lab Results:  Results for orders placed or performed during the hospital encounter of 01/02/20 (from the past 48 hour(s))  Urine Culture     Status: Abnormal   Collection Time: 01/08/20  3:18 PM   Specimen: Urine, Clean Catch  Result Value Ref Range   Specimen Description      URINE, CLEAN CATCH Performed at [facility], [address], [place], [place] [address]    Special Requests      NONE Performed at [facility], [address], [place], [place] [address]    Culture (A)     <10,000 COLONIES/mL INSIGNIFICANT GROWTH Performed at [facility], [place] [address] [place], [place] [address]    Report Status 01/09/2020 FINAL     Blood Alcohol level:  Lab Results  Component Value Date   ETH <10 01/02/2020   ETH <5 25/85/2778    Metabolic Disorder Labs: Lab Results  Component Value Date   HGBA1C 5.3 11/22/2017   Lab Results  Component Value Date   PROLACTIN 33.0 (H) 11/22/2017   Lab Results  Component  Value Date   CHOL 222 (H) 01/02/2020   TRIG 190 (H) 01/02/2020   HDL 69 01/02/2020   CHOLHDL 3.2 01/02/2020   VLDL 38 01/02/2020   LDLCALC 115 (H) 01/02/2020   LDLCALC 119 (H) 11/22/2017    Physical Findings: AIMS: Facial and Oral Movements Muscles of Facial Expression: None, normal Lips and Perioral Area: None, normal Jaw: None, normal Tongue: None, normal,Extremity Movements Upper (arms, wrists, hands, fingers): None, normal Lower (legs, knees, ankles, toes): None, normal, Trunk Movements Neck, shoulders, hips: None, normal, Overall Severity Severity  of abnormal movements (highest score from questions above): None, normal Incapacitation due to abnormal movements: None, normal Patient's awareness of abnormal movements (rate only patient's report): No Awareness, Dental Status Current problems with teeth and/or dentures?: No Does patient usually wear dentures?: No  CIWA:  CIWA-Ar Total: 1 COWS:  COWS Total Score: 2  Musculoskeletal: Strength & Muscle Tone: within normal limits Gait & Station: normal Patient leans: N/A  Psychiatric Specialty Exam: Physical Exam Vitals and nursing note reviewed.  Constitutional:      Appearance: Normal appearance.  HENT:     Head: Normocephalic and atraumatic.     Right Ear: External ear normal.     Left Ear: External ear normal.     Nose: Nose normal.     Mouth/Throat:     Mouth: Mucous membranes are moist.     Pharynx: Oropharynx is clear.  Eyes:     Extraocular Movements: Extraocular movements intact.     Conjunctiva/sclera: Conjunctivae normal.     Pupils: Pupils are equal, round, and reactive to light.  Cardiovascular:     Rate and Rhythm: Normal rate.     Pulses: Normal pulses.  Pulmonary:     Effort: Pulmonary effort is normal.     Breath sounds: Normal breath sounds.  Abdominal:     General: Abdomen is flat.     Palpations: Abdomen is soft.  Musculoskeletal:        General: Normal range of motion.     Cervical back: Normal range of motion and neck supple.     Comments: Rheumatoid arthritis in bilateral fingers and toes  Skin:    General: Skin is warm and dry.  Neurological:     General: No focal deficit present.     Mental Status: She is alert and oriented to person, place, and time.  Psychiatric:        Attention and Perception: Attention normal.        Mood and Affect: Mood is anxious.        Speech: Speech is rapid and pressured.        Behavior: Behavior is cooperative.        Thought Content: Thought content normal.        Cognition and Memory: Cognition and memory  normal.        Judgment: Judgment normal.     Review of Systems  Constitutional: Positive for appetite change. Negative for fatigue.  HENT: Positive for ear pain and sore throat.   Eyes: Negative for photophobia and visual disturbance.  Respiratory: Negative for cough and shortness of breath.   Cardiovascular: Negative for chest pain and palpitations.  Gastrointestinal: Negative for constipation, diarrhea, nausea and vomiting.  Endocrine: Negative for cold intolerance and heat intolerance.  Genitourinary: Positive for urgency. Negative for dysuria.  Musculoskeletal: Negative for arthralgias and myalgias.  Skin: Negative for rash and wound.  Allergic/Immunologic: Negative for food allergies and immunocompromised state.  Neurological: Negative  for dizziness and headaches.  Hematological: Negative for adenopathy. Does not bruise/bleed easily.  Psychiatric/Behavioral: Positive for sleep disturbance. Negative for hallucinations and suicidal ideas. The patient is nervous/anxious.     Blood pressure 139/88, pulse 72, temperature 97.8 F (36.6 C), temperature source Oral, resp. rate 18, height 5' (1.524 m), weight 48.1 kg, SpO2 95 %.Body mass index is 20.7 kg/m.  General Appearance: Casual and Fairly Groomed  Eye Contact:  Good  Speech:  Pressured  Volume:  Normal  Mood:  hypomanic  Affect:  Congruent  Thought Process:  Coherent and Goal Directed  Orientation:  Full (Time, Place, and Person)  Thought Content:  Logical and Tangential  Suicidal Thoughts:  No  Homicidal Thoughts:  No  Memory:  Immediate;   Fair Recent;   Fair Remote;   Fair  Judgement:  Fair  Insight:  Shallow  Psychomotor Activity:  Normal  Concentration:  Concentration: Fair and Attention Span: Fair  Recall:  [employer] of Knowledge:  Fair  Language:  Fair  Akathisia:  No  Handed:  Right  AIMS (if indicated):     Assets:  Communication Skills Desire for Improvement Housing Social Support  ADL's:  Intact   Cognition:  WNL  Sleep:  Number of Hours: 8.5     Treatment Plan Summary: Daily contact with patient to assess and evaluate symptoms and progress in treatment and Medication management  Patient is a 74 year old female with the above-stated past psychiatric history who is seen in follow-up.  Chart reviewed. Patient discussed with nursing. Patient appears to be improving compare to her presentation upon admission, although still expressing some hypomanic symptoms. Will continue current medicatrions without significant changes today. Diflucan 150 mg one time dose for yeast infection, pepcid 10 mg daily for GERD, macrobid 100 mg BID for 5 days for UTI, visine for eye iritation.    Plan:  -continue inpatient psych admission; 15-minute checks; daily contact with patient to assess and evaluate symptoms and progress in treatment; psychoeducation.  -continue scheduled medications: . aspirin  81 mg Oral Q breakfast  . calcium-vitamin D  0.5 tablet Oral Q breakfast  . carbamazepine  400 mg Oral BID  . divalproex  500 mg Oral BID  . famotidine  10 mg Oral Daily  . fluticasone  2 spray Each Nare Daily  . folic acid  2 mg Oral Q breakfast  . gabapentin  300 mg Oral BID  . levothyroxine  75 mcg Oral Q0600  . methotrexate  15 mg Oral Q Wed  . mirabegron ER  50 mg Oral Daily  . multivitamin with minerals  1 tablet Oral Q breakfast  . nitrofurantoin (macrocrystal-monohydrate)  100 mg Oral Q12H  . pravastatin  20 mg Oral q1800  . QUEtiapine  50 mg Oral QHS  . temazepam  7.5 mg Oral QHS   -continue PRN medications.  acetaminophen, alum & mag hydroxide-simeth, hydrALAZINE, LORazepam, magnesium hydroxide, menthol-cetylpyridinium, naphazoline-pheniramine, naproxen, phenylephrine-shark liver oil-mineral oil-petrolatum  -Pertinent Labs: No new labs  -EKG: on 10/26 showed Qtc 425 with normal sinus rhythm    -Consults: No new consults placed since yesterday    -Disposition: Likely d/c home tomorrow  with home health and with outpatient psych follow-up.  -  I certify that the patient does need, on a daily basis, active treatment furnished directly by or requiring the supervision of inpatient psychiatric facility personnel.   Salley Scarlet, MD 01/10/2020, 11:22 AM

## 2020-01-11 DIAGNOSIS — F3112 Bipolar disorder, current episode manic without psychotic features, moderate: Secondary | ICD-10-CM | POA: Diagnosis not present

## 2020-01-11 NOTE — Discharge Summary (Signed)
Physician Discharge Summary Note  Patient:  Stephanie [name] is an 74 y.o., female MRN:  [id number] DOB:  12/31/1945 Patient phone:  [phone] (home)  Patient address:   [address] [place] [address],  Total Time spent with patient: 1 hour  Date of Admission:  01/02/2020 Date of Discharge: 01/11/2020  Reason for Admission:  Acute manic episode with inability to care for self  Principal Problem: Bipolar 1 disorder, manic, moderate ([place]) Discharge Diagnoses: Principal Problem:   Bipolar 1 disorder, manic, moderate ([place]) Active Problems:   H/O gastrointestinal disease   GAD (generalized anxiety disorder)   Insomnia, persistent   Past Psychiatric History: Longstanding diagnosis of bipolar disorder. It looks like separate diagnoses of anxiety disorders have been entertained as well. He has had previous hospitalizations but reports that the last time was many years ago. Denies any past suicide attempts. He is currently on a combination of Tegretol and Depakote as her primary medications. She claims that she has been tried on other medicines but has developed a fear that she would have tardive dyskinesia. In fact she claims that somebody told her she already has tardive dyskinesia which is got her even more nervous.  Past Medical History:  Past Medical History:  Diagnosis Date  . Anginal pain ([place])   . Anxiety   . Bipolar 1 disorder ([place])   . Bipolar disorder ([place])   . Coronary artery disease   . Depression   . Fatigue   . Fibromyalgia   . GERD (gastroesophageal reflux disease)   . Heart disease   . Heart failure ([place])   . HLD (hyperlipidemia)   . HOH (hard of hearing)   . Hypothyroidism   . IBS (irritable bowel syndrome)   . Pelvic pain in female   . Perianal lesion    high grade sil lesion- keratinizing type  . RA (rheumatoid arthritis) ([place])   . Raynaud phenomenon   . Sjogren's disease ([place])   . Sleep apnea   . Vaginal Pap smear, abnormal     Past Surgical  History:  Procedure Laterality Date  . CATARACT EXTRACTION W/PHACO Left 02/23/2017   Procedure: CATARACT EXTRACTION PHACO AND INTRAOCULAR LENS PLACEMENT (IOC);  Surgeon: Birder Robson, MD;  Location: [facility] ORS;  Service: Ophthalmology;  Laterality: Left;  [place] 00:35 AP% 9.6 CDE 3.47 Fluid pack lot # [id number]  . CATARACT EXTRACTION W/PHACO Right 03/30/2017   Procedure: CATARACT EXTRACTION PHACO AND INTRAOCULAR LENS PLACEMENT ([place]);  Surgeon: Birder Robson, MD;  Location: [facility] ORS;  Service: Ophthalmology;  Laterality: Right;  [place] 00:32.6 AP% 13.8 CDE 4.51 Fluid Pack Lot # [id number]  . EYE SURGERY    . TONSILLECTOMY     Family History:  Family History  Problem Relation Age of Onset  . Heart failure Father   . Parkinson's disease Mother   . Bipolar disorder Mother   . Depression Sister   . Schizophrenia Paternal Grandmother   . Diabetes Maternal Grandmother   . Pancreatic cancer Maternal Grandmother   . Cancer Neg Hx   . Heart disease Neg Hx   . Breast cancer Neg Hx    Family Psychiatric  History: Mother with bipolar I disorder, paternal grandfather with schizophrenia. Sister with mental health issues as well.  Social History:  Social History   Substance and Sexual Activity  Alcohol Use No  . Alcohol/week: 0.0 standard drinks     Social History   Substance and Sexual Activity  Drug Use No    Social  History   Socioeconomic History  . Marital status: Divorced    Spouse name: Not on file  . Number of children: 2  . Years of education: Not on file  . Highest education level: Master's degree (e.g., MA, MS, MEng, MEd, MSW, MBA)  Occupational History    Comment: retired2  Tobacco Use  . Smoking status: Never Smoker  . Smokeless tobacco: Former Network engineer  . Vaping Use: Never used  Substance and Sexual Activity  . Alcohol use: No    Alcohol/week: 0.0 standard drinks  . Drug use: No  . Sexual activity: Never    Birth control/protection: Post-menopausal   Other Topics Concern  . Not on file  Social History Narrative  . Not on file   Social Determinants of Health   Financial Resource Strain:   . Difficulty of Paying Living Expenses: Not on file  Food Insecurity:   . Worried About Charity fundraiser in the Last Year: Not on file  . Ran Out of Food in the Last Year: Not on file  Transportation Needs:   . Lack of Transportation (Medical): Not on file  . Lack of Transportation (Non-Medical): Not on file  Physical Activity:   . Days of Exercise per Week: Not on file  . Minutes of Exercise per Session: Not on file  Stress:   . Feeling of Stress : Not on file  Social Connections:   . Frequency of Communication with Friends and Family: Not on file  . Frequency of Social Gatherings with Friends and Family: Not on file  . Attends Religious Services: Not on file  . Active Member of Clubs or Organizations: Not on file  . Attends Archivist Meetings: Not on file  . Marital Status: Not on file    Hospital Course:  Patient admitted to our unit voluntarily for acute mania likely triggered by a UTI. She was given one time dose of Fosfomycin, but UTI did not clear. She was then placed on Macrobid course for 5 days. Her symptoms of dysuria have dissipated since starting this medication, and will be completed at home. In the hospital, Arman Filter was discontinued. She was placed on Seroquel 50 mg QHS along with Temazepam 7.5 mg QHS. With this combination, she began to sleep 6-8 hours a night. She continues to have pressured speech, but is significantly more organized than on admission. She is now showering and attending to ADLs, and future-oriented. She is aware of her limitations on driving, and has already looked to set up home health, meals on wheels, and virtual visits with physicians. At this time she is able to care for herself, and is no longer a danger to herself. She denies suicidal ideations, homicidal ideations, visual hallucinations, and  auditory hallucinations. Daughter updated on progress throughout hospital stay. Patient, daughter, and treatment team feel she is safe to discharge with close outpatient follow-up.   Physical Findings: AIMS: Facial and Oral Movements Muscles of Facial Expression: None, normal Lips and Perioral Area: None, normal Jaw: None, normal Tongue: None, normal,Extremity Movements Upper (arms, wrists, hands, fingers): None, normal Lower (legs, knees, ankles, toes): None, normal, Trunk Movements Neck, shoulders, hips: None, normal, Overall Severity Severity of abnormal movements (highest score from questions above): None, normal Incapacitation due to abnormal movements: None, normal Patient's awareness of abnormal movements (rate only patient's report): No Awareness, Dental Status Current problems with teeth and/or dentures?: No Does patient usually wear dentures?: No  CIWA:  CIWA-Ar Total: 1 COWS:  COWS Total Score: 2  Musculoskeletal: Strength & Muscle Tone: within normal limits Gait & Station: normal Patient leans: N/A  Psychiatric Specialty Exam: Physical Exam Vitals and nursing note reviewed.  Constitutional:      Appearance: Normal appearance.  HENT:     Head: Normocephalic and atraumatic.     Right Ear: External ear normal.     Left Ear: External ear normal.     Nose: Nose normal.     Mouth/Throat:     Mouth: Mucous membranes are moist.     Pharynx: Oropharynx is clear.  Eyes:     Extraocular Movements: Extraocular movements intact.     Conjunctiva/sclera: Conjunctivae normal.     Pupils: Pupils are equal, round, and reactive to light.  Cardiovascular:     Rate and Rhythm: Normal rate.     Pulses: Normal pulses.  Pulmonary:     Effort: Pulmonary effort is normal.     Breath sounds: Normal breath sounds.  Abdominal:     General: Abdomen is flat.     Palpations: Abdomen is soft.  Musculoskeletal:        General: No swelling. Normal range of motion.     Cervical back:  Normal range of motion and neck supple.  Skin:    General: Skin is warm and dry.  Neurological:     General: No focal deficit present.     Mental Status: She is alert and oriented to person, place, and time.  Psychiatric:        Mood and Affect: Mood normal.        Behavior: Behavior normal.        Thought Content: Thought content normal.        Judgment: Judgment normal.     Review of Systems  Constitutional: Negative for appetite change and fatigue.  HENT: Negative for sinus pressure and sore throat.   Eyes: Negative for photophobia and visual disturbance.  Respiratory: Negative for cough and shortness of breath.   Cardiovascular: Negative for chest pain and palpitations.  Gastrointestinal: Negative for constipation, nausea and vomiting.  Endocrine: Negative for cold intolerance and heat intolerance.  Genitourinary: Positive for frequency. Negative for dysuria.  Musculoskeletal: Positive for arthralgias and myalgias.  Skin: Negative for rash and wound.  Allergic/Immunologic: Negative for food allergies and immunocompromised state.  Neurological: Negative for dizziness and headaches.  Hematological: Negative for adenopathy. Does not bruise/bleed easily.  Psychiatric/Behavioral: Negative for dysphoric mood, hallucinations, sleep disturbance and suicidal ideas.   Blood pressure (!) 156/86, pulse 75, temperature 98.2 F (36.8 C), temperature source Oral, resp. rate 18, height 5' (1.524 m), weight 48.1 kg, SpO2 97 %.Body mass index is 20.7 kg/m.  General Appearance: Well Groomed  Engineer, water::  Good  Speech:  Clear and Coherent409  Volume:  Normal  Mood:  hypomanic  Affect:  Congruent  Thought Process:  Coherent  Orientation:  Full (Time, Place, and Person)  Thought Content:  Logical  Suicidal Thoughts:  No  Homicidal Thoughts:  No  Memory:  Immediate;   Good Recent;   Good Remote;   Good  Judgement:  Fair  Insight:  Good  Psychomotor Activity:  Normal  Concentration:   Good  Recall:  Good  Fund of Knowledge:Good  Language: Good  Akathisia:  Negative  Handed:  Right  AIMS (if indicated):     Assets:  Communication Skills Desire for Improvement Financial Resources/Insurance Housing Resilience Social Support  Sleep:  Number of Hours: 7  Cognition: WNL  ADL's:  Intact        Have you used any form of tobacco in the last 30 days? (Cigarettes, Smokeless Tobacco, Cigars, and/or Pipes): No  Has this patient used any form of tobacco in the last 30 days? (Cigarettes, Smokeless Tobacco, Cigars, and/or Pipes)  No  Blood Alcohol level:  Lab Results  Component Value Date   ETH <10 01/02/2020   ETH <5 19/41/7408    Metabolic Disorder Labs:  Lab Results  Component Value Date   HGBA1C 5.3 11/22/2017   Lab Results  Component Value Date   PROLACTIN 33.0 (H) 11/22/2017   Lab Results  Component Value Date   CHOL 222 (H) 01/02/2020   TRIG 190 (H) 01/02/2020   HDL 69 01/02/2020   CHOLHDL 3.2 01/02/2020   VLDL 38 01/02/2020   LDLCALC 115 (H) 01/02/2020   LDLCALC 119 (H) 11/22/2017    See Psychiatric Specialty Exam and Suicide Risk Assessment completed by Attending Physician prior to discharge.  Discharge destination:  Home  Is patient on multiple antipsychotic therapies at discharge:  No   Has Patient had three or more failed trials of antipsychotic monotherapy by history:  No  Recommended Plan for Multiple Antipsychotic Therapies: NA  Discharge Instructions    Diet general   Complete by: As directed    Increase activity slowly   Complete by: As directed      Allergies as of 01/11/2020      Reactions   Amoxicillin-pot Clavulanate Nausea Only, Other (See Comments)   Diarrhea Has patient had a PCN reaction causing immediate rash, facial/tongue/throat swelling, SOB or lightheadedness with hypotension:  Has patient had a PCN reaction causing severe rash involving mucus membranes or skin necrosis: Has patient had a PCN reaction that  required hospitalization:  Has patient had a PCN reaction occurring within the last 10 years: If all of the above answers are "NO", then may proceed with Cephalosporin use.   Oxycodone-acetaminophen Nausea Only   Erythromycin Nausea Only   Azithromycin Nausea And Vomiting   Codeine    Macrolides And Ketolides    Oxycodone-acetaminophen    Penicillins Other (See Comments)   Has patient had a PCN reaction causing immediate rash, facial/tongue/throat swelling, SOB or lightheadedness with hypotension: NO Has patient had a PCN reaction causing severe rash involving mucus membranes or skin necrosis: NO Has patient had a PCN reaction that required hospitalization: NO Has patient had a PCN reaction occurring within the last 10 years: NO If all of the above answers are "NO", then may proceed with Cephalosporin use.   Remeron [mirtazapine]    Side effect-likely tardive dyskinesia   Tramadol Nausea And Vomiting   Zolpidem    Sleep walking   Lamotrigine Rash   LAMICTAL   Sulfa Antibiotics Rash      Medication List    STOP taking these medications   cariprazine capsule Commonly known as: Vraylar   eszopiclone 1 MG Tabs tablet Commonly known as: LUNESTA   ORENCIA IV     TAKE these medications     Indication  Acetaminophen 500 MG capsule Take by mouth.  Indication: Pain   aspirin 81 MG chewable tablet Chew 81 mg by mouth daily with breakfast.  Indication: Pain   carbamazepine 200 MG 12 hr tablet Commonly known as: TEGRETOL XR Take 2 tablets (400 mg total) by mouth 2 (two) times daily.  Indication: Manic-Depression   [employer] Calcium Citrate +D3 Mini 200-250 MG-UNIT Tabs Generic drug: Calcium Citrate-Vitamin D Take 1  tablet by mouth daily after breakfast.  Indication: Low Amount of Calcium in the Blood   divalproex 500 MG 24 hr tablet Commonly known as: DEPAKOTE ER Take 1 tablet twice a day What changed:   how much to take  how to take this  when to take this   Indication: Manic Phase of Manic-Depression   fluticasone 50 MCG/ACT nasal spray Commonly known as: FLONASE  Indication: Allergic Rhinitis   folic acid 1 MG tablet Commonly known as: FOLVITE Take 2 mg by mouth daily with breakfast.  Indication: Methotrexate Poisoning   gabapentin 300 MG capsule Commonly known as: NEURONTIN Take 1 tablet twice a day What changed:   how much to take  how to take this  when to take this  Indication: Disease of the Peripheral Nerves   ibandronate 150 MG tablet Commonly known as: BONIVA  Indication: Postmenopausal Osteoporosis   levothyroxine 75 MCG tablet Commonly known as: SYNTHROID Take 75 mcg by mouth daily before breakfast.  Indication: Underactive Thyroid   methotrexate 2.5 MG tablet Take 15 mg by mouth every Wednesday. WEDNESDAYS AT LUNCH  Indication: Rheumatoid Arthritis   multivitamin with minerals tablet Take 1 tablet by mouth daily with breakfast.  Indication: Vitamin deficiency   Myrbetriq 25 MG Tb24 tablet Generic drug: mirabegron ER Take 25 mg by mouth daily.  Indication: Frequent Urination   nitrofurantoin (macrocrystal-monohydrate) 100 MG capsule Commonly known as: MACROBID Take 1 capsule (100 mg total) by mouth every 12 (twelve) hours.  Indication: Simple Infection of the Urinary Tract   pravastatin 40 MG tablet Commonly known as: PRAVACHOL Take 40 mg by mouth daily.  Indication: High Amount of Fats in the Blood   QUEtiapine 50 MG Tb24 24 hr tablet Commonly known as: SEROQUEL XR Take 1 tablet (50 mg total) by mouth at bedtime.  Indication: Manic-Depression   temazepam 7.5 MG capsule Commonly known as: RESTORIL Take 1 capsule (7.5 mg total) by mouth at bedtime.  Indication: Network engineer  (From admission, onward)         Start     Ordered   01/10/20 1122  For home use only DME 4 wheeled rolling walker with seat  Once       Question:  Patient needs a walker  to treat with the following condition  Answer:  Rheumatoid arthritis ([place])   01/10/20 1121          Follow-up Information    [facility] Follow up.   Specialty: Behavioral Health Why: Your follow up appointment is scheduled for 01/18/2020 at [place].  Appointment is a virtual appointment.  Thanks! Contact information: [place] [phone]              Follow-up recommendations:  Activity:  as tolerated Diet:  regular diet  Comments:  30-day prescriptions with one refill sent to [facility]. Prior authorization form for Temazepam completed 01/11/2020 prior to discharge.   Signed: Salley Scarlet, MD 01/11/2020, 10:12 AM

## 2020-01-11 NOTE — Progress Notes (Signed)
°  [facility] Adult Case Management Discharge Plan :  Will you be returning to the same living situation after discharge:  Yes,  pt reports that she is returning home. At discharge, do you have transportation home?: Yes,  CSW will assist. Do you have the ability to pay for your medications: Yes,  [employer] Medicare  Release of information consent forms completed and in the chart;  Patient's signature needed at discharge.  Patient to Follow up at:  Follow-up Information    [facility] Follow up.   Specialty: Behavioral Health Why: Your follow up appointment is scheduled for 01/18/2020 at [place].  Appointment is a virtual appointment.  Thanks! Contact information: [place] [phone]              Next level of care provider has access to [place] and Suicide Prevention discussed: Yes,  SPE completed with pt and pt's daughter.  Have you used any form of tobacco in the last 30 days? (Cigarettes, Smokeless Tobacco, Cigars, and/or Pipes): No  Has patient been referred to the Quitline?: Patient refused referral  Patient has been referred for addiction treatment: Pt. refused referral  Rozann Lesches, LCSW 01/11/2020, 9:48 AM

## 2020-01-11 NOTE — BHH Counselor (Signed)
CSW attempted to call [place] with [facility] [phone]. CSW left HIPAA compliant voicemail.  Assunta Curtis, MSW, LCSW 01/11/2020 9:09 AM

## 2020-01-11 NOTE — BHH Suicide Risk Assessment (Signed)
[facility] Discharge Suicide Risk Assessment   Principal Problem: Bipolar 1 disorder, manic, moderate ([place]) Discharge Diagnoses: Principal Problem:   Bipolar 1 disorder, manic, moderate ([facility]) Active Problems:   H/O gastrointestinal disease   GAD (generalized anxiety disorder)   Insomnia, persistent   Total Time spent with patient: 1 hour  Musculoskeletal: Strength & Muscle Tone: within normal limits Gait & Station: uses walker to ambulate Patient leans: N/A  Psychiatric Specialty Exam: Review of Systems  Constitutional: Negative for appetite change and fatigue.  HENT: Negative for sinus pressure and sore throat.   Eyes: Negative for photophobia and visual disturbance.  Respiratory: Negative for cough and shortness of breath.   Cardiovascular: Negative for chest pain and palpitations.  Gastrointestinal: Negative for constipation, nausea and vomiting.  Endocrine: Negative for cold intolerance and heat intolerance.  Genitourinary: Positive for frequency. Negative for dysuria.  Musculoskeletal: Positive for arthralgias and myalgias.  Skin: Negative for rash and wound.  Allergic/Immunologic: Negative for food allergies and immunocompromised state.  Neurological: Negative for dizziness and headaches.  Hematological: Negative for adenopathy. Does not bruise/bleed easily.  Psychiatric/Behavioral: Negative for dysphoric mood, hallucinations, sleep disturbance and suicidal ideas.    Blood pressure (!) 156/86, pulse 75, temperature 98.2 F (36.8 C), temperature source Oral, resp. rate 18, height 5' (1.524 m), weight 48.1 kg, SpO2 97 %.Body mass index is 20.7 kg/m.  General Appearance: Well Groomed  Engineer, water::  Good  Speech:  Clear and Coherent409  Volume:  Normal  Mood:  hypomanic  Affect:  Congruent  Thought Process:  Coherent  Orientation:  Full (Time, Place, and Person)  Thought Content:  Logical  Suicidal Thoughts:  No  Homicidal Thoughts:  No  Memory:  Immediate;    Good Recent;   Good Remote;   Good  Judgement:  Fair  Insight:  Good  Psychomotor Activity:  Normal  Concentration:  Good  Recall:  Good  Fund of Knowledge:Good  Language: Good  Akathisia:  Negative  Handed:  Right  AIMS (if indicated):     Assets:  Communication Skills Desire for Improvement Financial Resources/Insurance Housing Resilience Social Support  Sleep:  Number of Hours: 7  Cognition: WNL  ADL's:  Intact   Mental Status Per Nursing Assessment::   On Admission:  NA  Demographic Factors:  Age 74 or older and Caucasian  Loss Factors: NA  Historical Factors: NA  Risk Reduction Factors:   Sense of responsibility to family, Religious beliefs about death, Positive social support, Positive therapeutic relationship and Positive coping skills or problem solving skills  Continued Clinical Symptoms:  Bipolar Disorder:  Most recent episode manic Previous Psychiatric Diagnoses and Treatments Medical Diagnoses and Treatments/Surgeries  Cognitive Features That Contribute To Risk:  None    Suicide Risk:  Minimal: No identifiable suicidal ideation.  Patients presenting with no risk factors but with morbid ruminations; may be classified as minimal risk based on the severity of the depressive symptoms   Follow-up Information    [facility] Follow up.   Specialty: Behavioral Health Why: Your follow up appointment is scheduled for 01/18/2020 at [place].  Appointment is a virtual appointment.  Thanks! Contact information: [place] [phone]              Plan Of Care/Follow-up recommendations:  Activity:  as tolearted Diet:  regular diet  Salley Scarlet, MD 01/11/2020, 10:09 AM

## 2020-01-11 NOTE — Progress Notes (Signed)
Recreation Therapy Notes  Date: 01/11/2020  Time: 9:30 am   Location: Craft room  Behavioral response: Appropriate  Intervention Topic: Leisure  Discussion/Intervention:  Group content today was focused on leisure. The group defined what leisure is and some positive leisure activities they participate in. Individuals identified the difference between good and bad leisure. Participants expressed how they feel after participating in the leisure of their choice. The group discussed how they go about picking a leisure activity and if others are involved in their leisure activities. The patient stated how many leisure activities they have to choose from and reasons why it is important to have leisure time. Individuals participated in the intervention "Exploration of Leisure" where they had a chance to identify new leisure activities as well as benefits of leisure.  Clinical Observations/Feedback: Patient came to group and defined leisure as rest. Participant expressed that her leisure includes painting and pin and ink. She explained that plans and the time of day can affect leisure participation. Individual was social with peers and staff while participating in the intervention.  Zarah Carbon LRT/CTRS         Bellamarie Pflug 01/11/2020 2:36 PM

## 2020-01-11 NOTE — Progress Notes (Signed)
Pt discharged at 1210pm today via safe transport to go home. Upon discharge, pt is alert and oriented to person, place, time and situation. Pt denies suicidal and homicidal ideation, denies hallucinations, denies feelings of depression and anxiety. Pt was given discharge instructions which include, discharge medication education, prescriptions were sent to pt's outpt pharmacy electronically by the doctor, discharge medication education was provided to the pt; pt verbalized understanding. All personal belongings listed on the paper chart on the belongings sheet were given to the patient, pt verbally confirmed that all things that were given to pt on this unit and that were on this unit in her locker and in her room, were returned to pt upon discharge. No distress noted, none reported, pt voiced no complaints upon discharge, and was given a patient survey to fill out. Pt was escorted out via wheelchair by staff to safe transport vehicle and belongings were placed in the vehicle.

## 2020-01-11 NOTE — Progress Notes (Signed)
Recreation Therapy Notes  INPATIENT RECREATION TR PLAN  Patient Details Name: Stephanie [name] MRN: [id number] DOB: 1945/05/04 Today's Date: 01/11/2020  Rec Therapy Plan Is patient appropriate for Therapeutic Recreation?: Yes Treatment times per week: at least 3 Estimated Length of Stay: 5-7 days TR Treatment/Interventions: Group participation (Comment)  Discharge Criteria Pt will be discharged from therapy if:: Discharged Treatment plan/goals/alternatives discussed and agreed upon by:: Patient/family  Discharge Summary Short term goals set: Patient will engage in groups without prompting or encouragement from LRT x3 group sessions within 5 recreation therapy group sessions Short term goals met: Complete Progress toward goals comments: Groups attended Which groups?: Leisure education, Goal setting, Communication, Other (Comment) (Problem Solving, Necessities, Happiness, Self-care) Reason goals not met: N/A Therapeutic equipment acquired: N/A Reason patient discharged from therapy: Discharge from hospital Pt/family agrees with progress & goals achieved: Yes Date patient discharged from therapy: 01/11/20   Stephanie [name] 01/11/2020, 2:44 PM

## 2020-01-11 NOTE — BHH Counselor (Signed)
CSW spoke with [place] with [facility] [phone]. She reports that she will follow up with the patient today and likely see the patient 01/12/2020.  She asked that CSW provide pt with her number and CSW did.  Assunta Curtis, MSW, LCSW 01/11/2020 9:48 AM

## 2020-01-15 ENCOUNTER — Other Ambulatory Visit: Payer: Self-pay | Admitting: Urology

## 2020-01-16 ENCOUNTER — Telehealth: Payer: Self-pay | Admitting: *Deleted

## 2020-01-16 NOTE — Telephone Encounter (Signed)
"  I have been discharged by [facility].  I called and canceled my appointment with Dr. Prudence Davidson before I went to the Psychiatric ward.  I got a message today that I would be charged $50.  I do not think this fair and I will dispute it!  I called and I sent a text!  I did not receive a message or anything!"  The appointment was canceled the day of the appointment, after the appointment time.

## 2020-01-17 ENCOUNTER — Other Ambulatory Visit: Payer: Self-pay | Admitting: *Deleted

## 2020-01-17 MED ORDER — MIRABEGRON ER 50 MG PO TB24
50.0000 mg | ORAL_TABLET | Freq: Every day | ORAL | 11 refills | Status: DC
Start: 2020-01-17 — End: 2021-01-13

## 2020-01-17 NOTE — Telephone Encounter (Signed)
Pt calling asking for increase from 25mg  myrbetriq to 50 and per office note ok to increase.  1. Overactive bladder / urinary frequency Patient has found that the increase to the Myrbetriq 50 mg daily has resulted in a great improvement in her urinary symptoms the biggest being she is not getting up at night and as an individual with bipolar disease is important that she can sleep when she can She will continue Myrbetriq 50 mg daily, but she will use of her Myrbetriq 25 mg daily by taking 2 of those tablets and will call us when she is in need of her Myrbetriq 50 mg daily prescription   rx sent to pharmacy by e-script Spoke with patient and advised results

## 2020-01-18 ENCOUNTER — Telehealth (INDEPENDENT_AMBULATORY_CARE_PROVIDER_SITE_OTHER): Payer: Medicare PPO | Admitting: Psychiatry

## 2020-01-18 ENCOUNTER — Encounter: Payer: Self-pay | Admitting: Psychiatry

## 2020-01-18 ENCOUNTER — Other Ambulatory Visit: Payer: Self-pay

## 2020-01-18 DIAGNOSIS — F3173 Bipolar disorder, in partial remission, most recent episode manic: Secondary | ICD-10-CM

## 2020-01-18 DIAGNOSIS — F411 Generalized anxiety disorder: Secondary | ICD-10-CM | POA: Diagnosis not present

## 2020-01-18 DIAGNOSIS — G2401 Drug induced subacute dyskinesia: Secondary | ICD-10-CM | POA: Diagnosis not present

## 2020-01-18 DIAGNOSIS — F5105 Insomnia due to other mental disorder: Secondary | ICD-10-CM

## 2020-01-18 MED ORDER — INGREZZA 40 MG PO CAPS: 40.0000 mg | ORAL_CAPSULE | Freq: Every day | ORAL | 1 refills | Status: DC

## 2020-01-18 NOTE — Progress Notes (Signed)
Virtual Visit via Video Note  I connected with [name] on 01/18/20 at  4:00 PM EST by a video enabled telemedicine application and verified that I am speaking with the correct person using two identifiers.  Location Provider Location : [facility] Patient Location : Home  Participants: Patient , Provider    I discussed the limitations of evaluation and management by telemedicine and the availability of in person appointments. The patient expressed understanding and agreed to proceed.    I discussed the assessment and treatment plan with the patient. The patient was provided an opportunity to ask questions and all were answered. The patient agreed with the plan and demonstrated an understanding of the instructions.   The patient was advised to call back or seek an in-person evaluation if the symptoms worsen or if the condition fails to improve as anticipated.   [place] MD OP Progress Note  01/18/2020 5:41 PM NESSIE NONG  MRN:  [id number]  Chief Complaint:  Chief Complaint    Follow-up     HPI: Stephanie [name] is a 2 year old Caucasian female, divorced, lives in [place] was evaluated by telemedicine today.  Patient has a history of bipolar disorder, coronary artery disease, fibromyalgia, GERD, hyperlipidemia, Raynaud's phenomenon, Sjogren's syndrome, hypothyroidism, IBS, recent fracture of the neck after a fall.  Patient was recently admitted and discharged from behavioral health unit at [facility].  Patient had a UTI which triggered a manic episode, sleep problems.  Patient today reports her manic symptoms have improved a lot.  She denies any significant mood swings or racing thoughts at this time.  She reports however she continues to struggle with some sleep restlessness.  She reports she goes to bed at around 9 PM and wakes up at around 3:30 AM.  She does get 6 to 6-1/2 hours of sleep.  This is much better than what she was getting few days ago.  When she was admitted to the  hospital she was getting only around 2 to 3 hours of sleep at night.  She however reports prior to her episode of mania she was getting 9 hours of sleep.  She however reports she feels rested when she wakes up in the morning and is happy with the change.  She is taking the Seroquel extended release which was recently started along with temazepam.  She reports she is tolerating it well.  She denies any dizziness or other side effects.  Patient during the evaluation appeared to have tongue thrusting movements.  Patient has been struggling with tardive dyskinesia since the past several months.  Initially it was thought to be due to mirtazapine.  In spite of stopping the mirtazapine she continues to have TD.  She wonders whether the TD could have been due to the hydroxyzine which she was on recently.  However when Probation officer discussed starting Ingrezza, she reports she is willing to give it a try.  Patient denies any suicidality, homicidality or perceptual disturbances.  Patient reports she currently has home OT and PT who comes in to help her.  She continues to be in pain from her rheumatoid arthritis.  She got late for her infusion since she was hospitalized at that time.  She reports it is very hard to get in for another infusion and the pain does affect her on a regular basis.  The pain could also be affecting her sleep .  She is using transportation services to get to her appointment and does not drive anymore.  Patient reports good support system from her daughter and son.  She denies any other concerns today.  Visit Diagnosis:    ICD-10-CM   1. Bipolar disorder, in partial remission, most recent episode manic (HCC)  F31.73   2. GAD (generalized anxiety disorder)  F41.1   3. Insomnia due to mental disorder  F51.05   4. Tardive dyskinesia  G24.01 Valbenazine Tosylate (INGREZZA) 40 MG CAPS    Past Psychiatric History: I have reviewed past psychiatric history from my progress note on 06/17/2017.   Most recent inpatient mental health [facility] behavioral health unit 01/02/2020-01/11/2020.  Past Medical History:  Past Medical History:  Diagnosis Date  . Anginal pain ([place])   . Anxiety   . Bipolar 1 disorder ([place])   . Bipolar disorder ([place])   . Coronary artery disease   . Depression   . Fatigue   . Fibromyalgia   . GERD (gastroesophageal reflux disease)   . Heart disease   . Heart failure ([place])   . HLD (hyperlipidemia)   . HOH (hard of hearing)   . Hypothyroidism   . IBS (irritable bowel syndrome)   . Pelvic pain in female   . Perianal lesion    high grade sil lesion- keratinizing type  . RA (rheumatoid arthritis) ([place])   . Raynaud phenomenon   . Sjogren's disease ([place])   . Sleep apnea   . Vaginal Pap smear, abnormal     Past Surgical History:  Procedure Laterality Date  . CATARACT EXTRACTION W/PHACO Left 02/23/2017   Procedure: CATARACT EXTRACTION PHACO AND INTRAOCULAR LENS PLACEMENT (IOC);  Surgeon: Birder Robson, MD;  Location: [facility] ORS;  Service: Ophthalmology;  Laterality: Left;  [place] 00:35 AP% 9.6 CDE 3.47 Fluid pack lot # [id number]  . CATARACT EXTRACTION W/PHACO Right 03/30/2017   Procedure: CATARACT EXTRACTION PHACO AND INTRAOCULAR LENS PLACEMENT ([place]);  Surgeon: Birder Robson, MD;  Location: [facility] ORS;  Service: Ophthalmology;  Laterality: Right;  [place] 00:32.6 AP% 13.8 CDE 4.51 Fluid Pack Lot # [id number]  . EYE SURGERY    . TONSILLECTOMY      Family Psychiatric History: I have reviewed family psychiatric history from my progress note on 06/17/2017.  Family History:  Family History  Problem Relation Age of Onset  . Heart failure Father   . Parkinson's disease Mother   . Bipolar disorder Mother   . Depression Sister   . Schizophrenia Paternal Grandmother   . Diabetes Maternal Grandmother   . Pancreatic cancer Maternal Grandmother   . Cancer Neg Hx   . Heart disease Neg Hx   . Breast cancer Neg Hx     Social  History: I have reviewed social history from my progress note from 06/17/2017. Social History   Socioeconomic History  . Marital status: Divorced    Spouse name: Not on file  . Number of children: 2  . Years of education: Not on file  . Highest education level: Master's degree (e.g., MA, MS, MEng, MEd, MSW, MBA)  Occupational History    Comment: retired2  Tobacco Use  . Smoking status: Never Smoker  . Smokeless tobacco: Former Network engineer  . Vaping Use: Never used  Substance and Sexual Activity  . Alcohol use: No    Alcohol/week: 0.0 standard drinks  . Drug use: No  . Sexual activity: Never    Birth control/protection: Post-menopausal  Other Topics Concern  . Not on file  Social History Narrative  . Not on file  Social Determinants of Health   Financial Resource Strain:   . Difficulty of Paying Living Expenses: Not on file  Food Insecurity:   . Worried About Charity fundraiser in the Last Year: Not on file  . Ran Out of Food in the Last Year: Not on file  Transportation Needs:   . Lack of Transportation (Medical): Not on file  . Lack of Transportation (Non-Medical): Not on file  Physical Activity:   . Days of Exercise per Week: Not on file  . Minutes of Exercise per Session: Not on file  Stress:   . Feeling of Stress : Not on file  Social Connections:   . Frequency of Communication with Friends and Family: Not on file  . Frequency of Social Gatherings with Friends and Family: Not on file  . Attends Religious Services: Not on file  . Active Member of Clubs or Organizations: Not on file  . Attends Archivist Meetings: Not on file  . Marital Status: Not on file    Allergies:  Allergies  Allergen Reactions  . Amoxicillin-Pot Clavulanate Nausea Only and Other (See Comments)    Diarrhea Has patient had a PCN reaction causing immediate rash, facial/tongue/throat swelling, SOB or lightheadedness with hypotension:  Has patient had a PCN reaction causing  severe rash involving mucus membranes or skin necrosis: Has patient had a PCN reaction that required hospitalization:  Has patient had a PCN reaction occurring within the last 10 years: If all of the above answers are "NO", then may proceed with Cephalosporin use.   Marland Kitchen Oxycodone-Acetaminophen Nausea Only  . Erythromycin Nausea Only  . Azithromycin Nausea And Vomiting  . Codeine   . Macrolides And Ketolides   . Oxycodone-Acetaminophen   . Penicillins Other (See Comments)    Has patient had a PCN reaction causing immediate rash, facial/tongue/throat swelling, SOB or lightheadedness with hypotension: NO Has patient had a PCN reaction causing severe rash involving mucus membranes or skin necrosis: NO Has patient had a PCN reaction that required hospitalization: NO Has patient had a PCN reaction occurring within the last 10 years: NO If all of the above answers are "NO", then may proceed with Cephalosporin use.   . Remeron [Mirtazapine]     Side effect-likely tardive dyskinesia  . Tramadol Nausea And Vomiting  . Zolpidem     Sleep walking  . Lamotrigine Rash    LAMICTAL  . Sulfa Antibiotics Rash    Metabolic Disorder Labs: Lab Results  Component Value Date   HGBA1C 5.3 11/22/2017   Lab Results  Component Value Date   PROLACTIN 33.0 (H) 11/22/2017   Lab Results  Component Value Date   CHOL 222 (H) 01/02/2020   TRIG 190 (H) 01/02/2020   HDL 69 01/02/2020   CHOLHDL 3.2 01/02/2020   VLDL 38 01/02/2020   LDLCALC 115 (H) 01/02/2020   LDLCALC 119 (H) 11/22/2017   Lab Results  Component Value Date   TSH 2.678 01/02/2020    Therapeutic Level Labs: No results found for: LITHIUM Lab Results  Component Value Date   VALPROATE 60 01/02/2020   VALPROATE 61 07/05/2019   No components found for:  CBMZ  Current Medications: Current Outpatient Medications  Medication Sig Dispense Refill  . Acetaminophen 500 MG capsule Take by mouth.    Marland Kitchen aspirin 81 MG chewable tablet Chew 81  mg by mouth daily with breakfast.    . Calcium Citrate-Vitamin D ([employer] CALCIUM CITRATE +D3 MINI) 200-250 MG-UNIT TABS Take 1 tablet  by mouth daily after breakfast.     . carbamazepine (TEGRETOL XR) 200 MG 12 hr tablet Take 2 tablets (400 mg total) by mouth 2 (two) times daily. 360 tablet 1  . divalproex (DEPAKOTE ER) 500 MG 24 hr tablet Take 1 tablet twice a day (Patient taking differently: Take 500 mg by mouth 2 (two) times daily. Take 1 tablet twice a day) 180 tablet 1  . fluticasone (FLONASE) 50 MCG/ACT nasal spray     . folic acid (FOLVITE) 1 MG tablet Take 2 mg by mouth daily with breakfast.     . gabapentin (NEURONTIN) 300 MG capsule Take 1 tablet twice a day (Patient taking differently: Take 300 mg by mouth 2 (two) times daily. Take 1 tablet twice a day) 180 capsule 1  . ibandronate (BONIVA) 150 MG tablet     . levothyroxine (SYNTHROID, LEVOTHROID) 75 MCG tablet Take 75 mcg by mouth daily before breakfast.     . methotrexate 2.5 MG tablet Take 15 mg by mouth every Wednesday. WEDNESDAYS AT LUNCH    . mirabegron ER (MYRBETRIQ) 50 MG TB24 tablet Take 1 tablet (50 mg total) by mouth daily. 30 tablet 11  . Multiple Vitamins-Minerals (MULTIVITAMIN WITH MINERALS) tablet Take 1 tablet by mouth daily with breakfast.     . nitrofurantoin, macrocrystal-monohydrate, (MACROBID) 100 MG capsule Take 1 capsule (100 mg total) by mouth every 12 (twelve) hours. 8 capsule 0  . pravastatin (PRAVACHOL) 40 MG tablet Take 40 mg by mouth daily.     . QUEtiapine (SEROQUEL XR) 50 MG TB24 24 hr tablet Take 1 tablet (50 mg total) by mouth at bedtime. 30 tablet 1  . temazepam (RESTORIL) 7.5 MG capsule Take 1 capsule (7.5 mg total) by mouth at bedtime. 30 capsule 1  . Valbenazine Tosylate (INGREZZA) 40 MG CAPS Take 1 capsule (40 mg total) by mouth at bedtime. 30 capsule 1   No current facility-administered medications for this visit.     Musculoskeletal: Strength & Muscle Tone: UTA Gait & Station: Uses a  cane Patient leans: N/A  Psychiatric Specialty Exam: Review of Systems  Musculoskeletal: Positive for arthralgias and myalgias.  Psychiatric/Behavioral: Positive for sleep disturbance. The patient is nervous/anxious.   All other systems reviewed and are negative.   There were no vitals taken for this visit.There is no height or weight on file to calculate BMI.  General Appearance: Casual  Eye Contact:  Fair  Speech:  Clear and Coherent  Volume:  Normal  Mood:  Anxious  Affect:  Congruent  Thought Process:  Goal Directed and Descriptions of Associations: Intact  Orientation:  Full (Time, Place, and Person)  Thought Content: Logical   Suicidal Thoughts:  No  Homicidal Thoughts:  No  Memory:  Immediate;   Fair Recent;   Fair Remote;   Fair  Judgement:  Fair  Insight:  Fair  Psychomotor Activity:  Normal  Concentration:  Concentration: Fair and Attention Span: Fair  Recall:  [employer] of Knowledge: Fair  Language: Fair  Akathisia:  No  Handed:  Right  AIMS (if indicated): does have tongue thrusting movements - observed several times during the session  Assets:  Communication Skills Desire for Improvement Housing Social Support  ADL's:  Intact  Cognition: WNL  Sleep:  improving   Screenings: AIMS     Admission (Discharged) from 01/02/2020 in [facility] Visit from 11/11/2017 in [facility] Total Score 0 0    AUDIT  Admission (Discharged) from 01/02/2020 in [facility]  Alcohol Use Disorder Identification Test Final Score (AUDIT) 0       Assessment and Plan: Stephanie [name] is a 48 year old Caucasian female, divorced, lives in [place], has a history of bipolar disorder, multiple medical problems including recent fracture, UTI was evaluated by telemedicine today.  Patient is currently recovering from her UTI as well as recent manic episode.  She continues to struggle with mild sleep  issues although improving.  She also has tardive dyskinesia.  She will benefit from the following medication management.  Plan Bipolar disorder most recent episode manic, moderate, in partial remission Continue Tegretol extended release 400 mg p.o. twice daily Tegretol level reviewed -01/02/2020-8.8-therapeutic Depakote extended release 500 mg p.o. twice daily Depakote level reviewed-01/02/2020-60-therapeutic Gabapentin 300 mg p.o. twice daily Continue Seroquel extended release 50 mg p.o. nightly  GAD-stable Continue CBT with [name]. Gabapentin 300 mg p.o. twice daily  Insomnia-improving Temazepam 7.5 mg p.o. nightly Continue Seroquel as prescribed. I have reviewed [place] controlled substance database. Patient with rheumatoid arthritis is past due for her infusion which could be contributing to pain which does have an impact on her sleep.  She will talk to her provider for the same.  Sufficient pain management is needed. Patient also with recent UTI continue to have mild urinary tract infection symptoms will talk to her primary care provider.  Tardive dyskinesia-unstable Start Ingrezza 40 mg p.o. nightly. Provided medication education.  Discussed the effect of Ingrezza on her cardiac health including QT, hepatic function especially in combination with her other medications like carbamazepine.  Patient had recent EKG done which was reviewed. We will monitor her closely while on Ingrezza.  I have reviewed most recent EKG in [facility] dated 01/02/2020-normal sinus rhythm.  QTC within normal limits.  I have reviewed medical records from her most recent hospitalization-dated 01/02/2020 to 01/11/2020-Dr. Freeman-patient with bipolar disorder manic, moderate, patient was placed on Seroquel and temazepam.  She began to sleep 6 to 8 hours at night.  She continued to have pressured speech however was more organized than on admission.'  Follow-up in clinic in 1 month or sooner if needed.  I  have spent atleast 30 minutes face to face by video with patient today. More than 50 % of the time was spent for preparing to see the patient ( e.g., review of test, records ), obtaining and to review and separately obtained history , ordering medications and test ,psychoeducation and supportive psychotherapy and care coordination,as well as documenting clinical information in electronic health record,interpreting and communication of test results This note was generated in part or whole with voice recognition software. Voice recognition is usually quite accurate but there are transcription errors that can and very often do occur. I apologize for any typographical errors that were not detected and corrected.        Ursula Alert, MD 01/19/2020, 9:10 AM

## 2020-01-19 ENCOUNTER — Telehealth: Payer: Self-pay

## 2020-01-19 NOTE — Telephone Encounter (Signed)
[employer] did not approve the prior auth for the ingrezza.  it states that she has to try and fail Austedo (deutetrabenzaine)

## 2020-01-19 NOTE — Telephone Encounter (Signed)
Returned call to patient , discussed Austedo. Provided medication education. She will wait until she feels better physically , currently with PT. She also does not want to be on Austedo due to interaction with seroquel , she likes the seroquel. Will wait for now. Ingrezza got declined by health insurance.

## 2020-01-19 NOTE — Telephone Encounter (Signed)
i submitted an appeal - pending

## 2020-01-26 ENCOUNTER — Telehealth: Payer: Medicare PPO | Admitting: Psychiatry

## 2020-02-05 ENCOUNTER — Telehealth: Payer: Self-pay

## 2020-02-05 NOTE — Telephone Encounter (Signed)
Ok thanks for letting me know

## 2020-02-05 NOTE — Telephone Encounter (Signed)
pt called left message that she feels good with the new medication.  she states that she didnt know if the medication also helped her sleep but she been sleeping good since being on medication.

## 2020-02-06 ENCOUNTER — Telehealth: Payer: Self-pay

## 2020-02-06 NOTE — Telephone Encounter (Signed)
Patient states she recently had a UA done from her PCP and the also did a urine culture. She was also in the hospital for 11 days for acute Mania in [facility]. She states she admitted herself. She states she was also treated for a UTI. Appointment has been made to discuss.

## 2020-02-06 NOTE — Telephone Encounter (Signed)
Stephanie [name] really needs to be evaluated for other reasons as to why the Myrbetriq is no longer working.  She will need an office visit so that we can rule out urinary tract infection and other urological causes for her nocturia.  If she is unable to sleep because the need to urinate is waking her up through the night, I also strongly recommend that she speak with her primary care physician regarding being evaluated for sleep apnea.

## 2020-02-06 NOTE — Telephone Encounter (Signed)
Patient called and LM on triage line stating that she believes Myrbetriq is no longer working for her despite increasing dose to 50mg . The patient sounds very distressed and states that this is an urgent matter as she is unable to sleep at night due to frequent urination. The patient states that her insomnia has been so bad it lead to her being admitted to the psych unit. Pt would like to know what other medications are available is can try. Please advise.

## 2020-02-13 NOTE — Progress Notes (Signed)
07/19/19 1:46 PM   [name] 08-28-1945 [id number]  Referring provider: Derinda Late, MD [phone] [facility] [place],  [place] [address] Chief Complaint  Patient presents with  . Over Active Bladder    HPI: Stephanie [name] is a 74 y.o. F who presents today for worsening of urinary symptoms.    She was initially seen by Dr. Erlene Quan on Jul 19, 2019.  At that visit she reported frequency every 15 to 20 minutes associated with urge incontinence for the last several months.  Please see her note for detailed history.  They discussed behavioral modifications and she was given Myrbetriq 25 mg samples for 1 month.  In the interim, she has suffered a fracture of her third cervical vertebrae due to a mechanical fall.  At her visit on 08/23/2019, she stated the Myrbetriq had significantly decreased her daytime frequency and her urge incontinence.  At her visit in September 2021, she felt that the Myrbetriq 25 mg was not as effective as she was hoping.  She was then increased to 50 mg of Myrbetriq daily.    The Myrbetriq 50 mg daily was effective until recently.  She stated she had one night when she was up 7 times.  She stated that she recorded her voids on a notebook, but she forgot to bring it with her today.  She had also called the after hour nurse line to ask how long the Myrbetriq would take to become effective and she states she was told by the on-call nurse that she may need a second medication in addition to the Myrbetriq to control her symptoms.  She also has friends who had a bladder sling placed in the past and is wondering if she needs this to control her urination.    She then also spoke about the relationship with her and her daughter and how this might be contributing to her anxiety level which in turn is worsening her urinary symptoms.  PMH: Past Medical History:  Diagnosis Date  . Anginal pain ([place])   . Anxiety   . Bipolar 1  disorder ([place])   . Bipolar disorder ([place])   . Coronary artery disease   . Depression   . Fatigue   . Fibromyalgia   . GERD (gastroesophageal reflux disease)   . Heart disease   . Heart failure ([place])   . HLD (hyperlipidemia)   . HOH (hard of hearing)   . Hypothyroidism   . IBS (irritable bowel syndrome)   . Pelvic pain in female   . Perianal lesion    high grade sil lesion- keratinizing type  . RA (rheumatoid arthritis) ([place])   . Raynaud phenomenon   . Sjogren's disease ([place])   . Sleep apnea   . Vaginal Pap smear, abnormal     Surgical History: Past Surgical History:  Procedure Laterality Date  . CATARACT EXTRACTION W/PHACO Left 02/23/2017   Procedure: CATARACT EXTRACTION PHACO AND INTRAOCULAR LENS PLACEMENT (IOC);  Surgeon: Birder Robson, MD;  Location: [facility] ORS;  Service: Ophthalmology;  Laterality: Left;  [place] 00:35 AP% 9.6 CDE 3.47 Fluid pack lot # [id number]  . CATARACT EXTRACTION W/PHACO Right 03/30/2017   Procedure: CATARACT EXTRACTION PHACO AND INTRAOCULAR LENS PLACEMENT ([place]);  Surgeon: Birder Robson, MD;  Location: [facility] ORS;  Service: Ophthalmology;  Laterality: Right;  [place] 00:32.6 AP% 13.8 CDE 4.51 Fluid Pack Lot # [id number]  . EYE SURGERY    . TONSILLECTOMY  Home Medications:  Allergies as of 02/14/2020      Reactions   Amoxicillin-pot Clavulanate Nausea Only, Other (See Comments)   Diarrhea Has patient had a PCN reaction causing immediate rash, facial/tongue/throat swelling, SOB or lightheadedness with hypotension:  Has patient had a PCN reaction causing severe rash involving mucus membranes or skin necrosis: Has patient had a PCN reaction that required hospitalization:  Has patient had a PCN reaction occurring within the last 10 years: If all of the above answers are "NO", then may proceed with Cephalosporin use.   Oxycodone-acetaminophen Nausea Only   Erythromycin Nausea Only   Azithromycin Nausea And Vomiting   Codeine    Macrolides And  Ketolides    Oxycodone-acetaminophen    Penicillins Other (See Comments)   Has patient had a PCN reaction causing immediate rash, facial/tongue/throat swelling, SOB or lightheadedness with hypotension: NO Has patient had a PCN reaction causing severe rash involving mucus membranes or skin necrosis: NO Has patient had a PCN reaction that required hospitalization: NO Has patient had a PCN reaction occurring within the last 10 years: NO If all of the above answers are "NO", then may proceed with Cephalosporin use.   Remeron [mirtazapine]    Side effect-likely tardive dyskinesia   Tramadol Nausea And Vomiting   Zolpidem    Sleep walking   Lamotrigine Rash   LAMICTAL   Sulfa Antibiotics Rash      Medication List       Accurate as of February 14, 2020  1:46 PM. If you have any questions, ask your nurse or doctor.        Acetaminophen 500 MG capsule Take by mouth.   aspirin 81 MG chewable tablet Chew 81 mg by mouth daily with breakfast.   carbamazepine 200 MG 12 hr tablet Commonly known as: TEGRETOL XR Take 2 tablets (400 mg total) by mouth 2 (two) times daily.   [facility] Calcium Citrate +D3 Mini 200-250 MG-UNIT Tabs Generic drug: Calcium Citrate-Vitamin D Take 1 tablet by mouth daily after breakfast.   divalproex 500 MG 24 hr tablet Commonly known as: DEPAKOTE ER Take 1 tablet twice a day What changed:   how much to take  how to take this  when to take this   fluticasone 50 MCG/ACT nasal spray Commonly known as: FLONASE   folic acid 1 MG tablet Commonly known as: FOLVITE Take 2 mg by mouth daily with breakfast.   gabapentin 300 MG capsule Commonly known as: NEURONTIN Take 1 tablet twice a day What changed:   how much to take  how to take this  when to take this   ibandronate 150 MG tablet Commonly known as: BONIVA   Ingrezza 40 MG Caps Generic drug: Valbenazine Tosylate Take 1 capsule (40 mg total) by mouth at bedtime.   levothyroxine 75 MCG  tablet Commonly known as: SYNTHROID Take 75 mcg by mouth daily before breakfast.   methotrexate 2.5 MG tablet Take 15 mg by mouth every Wednesday. WEDNESDAYS AT LUNCH   mirabegron ER 50 MG Tb24 tablet Commonly known as: Myrbetriq Take 1 tablet (50 mg total) by mouth daily.   multivitamin with minerals tablet Take 1 tablet by mouth daily with breakfast.   nitrofurantoin (macrocrystal-monohydrate) 100 MG capsule Commonly known as: MACROBID Take 1 capsule (100 mg total) by mouth every 12 (twelve) hours.   pravastatin 40 MG tablet Commonly known as: PRAVACHOL Take 40 mg by mouth daily.   QUEtiapine 50 MG Tb24 24 hr tablet Commonly known as:  SEROQUEL XR Take 1 tablet (50 mg total) by mouth at bedtime.   temazepam 7.5 MG capsule Commonly known as: RESTORIL Take 1 capsule (7.5 mg total) by mouth at bedtime.       Allergies:  Allergies  Allergen Reactions  . Amoxicillin-Pot Clavulanate Nausea Only and Other (See Comments)    Diarrhea Has patient had a PCN reaction causing immediate rash, facial/tongue/throat swelling, SOB or lightheadedness with hypotension:  Has patient had a PCN reaction causing severe rash involving mucus membranes or skin necrosis: Has patient had a PCN reaction that required hospitalization:  Has patient had a PCN reaction occurring within the last 10 years: If all of the above answers are "NO", then may proceed with Cephalosporin use.   Marland Kitchen Oxycodone-Acetaminophen Nausea Only  . Erythromycin Nausea Only  . Azithromycin Nausea And Vomiting  . Codeine   . Macrolides And Ketolides   . Oxycodone-Acetaminophen   . Penicillins Other (See Comments)    Has patient had a PCN reaction causing immediate rash, facial/tongue/throat swelling, SOB or lightheadedness with hypotension: NO Has patient had a PCN reaction causing severe rash involving mucus membranes or skin necrosis: NO Has patient had a PCN reaction that required hospitalization: NO Has patient had a  PCN reaction occurring within the last 10 years: NO If all of the above answers are "NO", then may proceed with Cephalosporin use.   . Remeron [Mirtazapine]     Side effect-likely tardive dyskinesia  . Tramadol Nausea And Vomiting  . Zolpidem     Sleep walking  . Lamotrigine Rash    LAMICTAL  . Sulfa Antibiotics Rash    Family History: Family History  Problem Relation Age of Onset  . Heart failure Father   . Parkinson's disease Mother   . Bipolar disorder Mother   . Depression Sister   . Schizophrenia Paternal Grandmother   . Diabetes Maternal Grandmother   . Pancreatic cancer Maternal Grandmother   . Cancer Neg Hx   . Heart disease Neg Hx   . Breast cancer Neg Hx     Social History:  reports that she has never smoked. She has quit using smokeless tobacco. She reports that she does not drink alcohol and does not use drugs.  ROS For pertinent review of systems please refer to history of present illness  Physical Exam: BP 123/80 (BP Location: Left Arm, Patient Position: Sitting, Cuff Size: Normal)   Pulse 87   Ht 5' (1.524 m)   Wt 110 lb (49.9 kg)   BMI 21.48 kg/m   Constitutional:  Well nourished. Alert and oriented, No acute distress. HEENT: [place] AT, mask in place.  Trachea midline Cardiovascular: No clubbing, cyanosis, or edema. Respiratory: Normal respiratory effort, no increased work of breathing. Neurologic: Grossly intact, no focal deficits, moving all 4 extremities.  In wheel chair.  Psychiatric: Normal mood and affect.   Laboratory Data: Lab Results  Component Value Date   CREATININE 0.41 (L) 01/02/2020  I have reviewed the labs.  Pertinent imaging Results for KALIYAH, GLADMAN (MRN [id number]) as of 02/19/2020 14:49  Ref. Range 02/14/2020 09:57  Scan Result Unknown 90mL    Assessment & Plan:    1. Overactive bladder / urinary frequency Patient has had a night where she got up frequently to use the restroom She will continue the Myrbetriq 50 mg daily   She was told by someone that she may need another medication to help control her bladder.  I explained the other medication is an  anticholinergic and it is not recommended for folks over the age of 18 due to its side effects of constipation, dry eyes, dry mouth and memory issues.  She is also not a candidate for a bladder tacking as she is not experiencing SUI and a bladder tacking would only make her symptoms worse. She is going to work on her relationship with her daughter and continue to record her voiding patterns. She will follow up as scheduled in April     2.  Urge incontinence See above   [facility] [address], [place], [place] [address] [phone]  Zara Council, PA-C

## 2020-02-14 ENCOUNTER — Ambulatory Visit: Payer: Medicare PPO | Admitting: Urology

## 2020-02-14 ENCOUNTER — Other Ambulatory Visit: Payer: Self-pay

## 2020-02-14 ENCOUNTER — Encounter: Payer: Self-pay | Admitting: Urology

## 2020-02-14 VITALS — BP 123/80 | HR 87 | Ht 60.0 in | Wt 110.0 lb

## 2020-02-14 DIAGNOSIS — N3281 Overactive bladder: Secondary | ICD-10-CM | POA: Diagnosis not present

## 2020-02-14 DIAGNOSIS — R351 Nocturia: Secondary | ICD-10-CM | POA: Diagnosis not present

## 2020-02-14 LAB — BLADDER SCAN AMB NON-IMAGING

## 2020-02-15 ENCOUNTER — Ambulatory Visit: Payer: Medicare PPO | Attending: Rheumatology | Admitting: Occupational Therapy

## 2020-02-15 ENCOUNTER — Ambulatory Visit: Payer: Medicare PPO | Admitting: Occupational Therapy

## 2020-02-15 DIAGNOSIS — M79641 Pain in right hand: Secondary | ICD-10-CM | POA: Diagnosis present

## 2020-02-15 DIAGNOSIS — M79642 Pain in left hand: Secondary | ICD-10-CM | POA: Insufficient documentation

## 2020-02-15 DIAGNOSIS — M25641 Stiffness of right hand, not elsewhere classified: Secondary | ICD-10-CM | POA: Diagnosis present

## 2020-02-15 DIAGNOSIS — M25642 Stiffness of left hand, not elsewhere classified: Secondary | ICD-10-CM | POA: Diagnosis not present

## 2020-02-15 NOTE — Therapy (Signed)
[facility] [address], [place], [address] Phone: [phone]   Fax:  [phone]  Occupational Therapy Evaluation  Patient Details  Name: Stephanie [name] MRN: [id number] Date of Birth: [birth year] Referring Provider (OT): Dr Jefm Bryant   Encounter Date: 02/15/2020   OT End of Session - 02/15/20 1250    Visit Number 1    Number of Visits 4    Date for OT Re-Evaluation 03/14/20    OT Start Time 1010    OT Stop Time 1114    OT Time Calculation (min) 64 min    Activity Tolerance Patient tolerated treatment well    Behavior During Therapy [facility] for tasks assessed/performed           Past Medical History:  Diagnosis Date  . Anginal pain ([place])   . Anxiety   . Bipolar 1 disorder ([place])   . Bipolar disorder ([place])   . Coronary artery disease   . Depression   . Fatigue   . Fibromyalgia   . GERD (gastroesophageal reflux disease)   . Heart disease   . Heart failure ([place])   . HLD (hyperlipidemia)   . HOH (hard of hearing)   . Hypothyroidism   . IBS (irritable bowel syndrome)   . Pelvic pain in female   . Perianal lesion    high grade sil lesion- keratinizing type  . RA (rheumatoid arthritis) ([place])   . Raynaud phenomenon   . Sjogren's disease ([place])   . Sleep apnea   . Vaginal Pap smear, abnormal     Past Surgical History:  Procedure Laterality Date  . CATARACT EXTRACTION W/PHACO Left 02/23/2017   Procedure: CATARACT EXTRACTION PHACO AND INTRAOCULAR LENS PLACEMENT (IOC);  Surgeon: Birder Robson, MD;  Location: [facility] ORS;  Service: Ophthalmology;  Laterality: Left;  [place] 00:35 AP% 9.6 CDE 3.47 Fluid pack lot # [id number]  . CATARACT EXTRACTION W/PHACO Right 03/30/2017   Procedure: CATARACT EXTRACTION PHACO AND INTRAOCULAR LENS PLACEMENT ([place]);  Surgeon: Birder Robson, MD;  Location: [facility] ORS;  Service: Ophthalmology;  Laterality: Right;  [place] 00:32.6 AP% 13.8 CDE 4.51 Fluid Pack Lot # [id number]  . EYE  SURGERY    . TONSILLECTOMY      There were no vitals filed for this visit.   Subjective Assessment - 02/15/20 1245    Subjective  I need your help again - i cannot straighten my R hand finger and index finger hurts and cannot bend - Dr Jefm Bryant thought for me to see you if you can help - I need some education on my exercises and how to adapt how I need to do things to save my hands    Patient Stated Goals I want to prevent my my fingers to go sideways - I do not want surgery     Currently in Pain? Yes    Pain Score 2     Pain Location Hand    Pain Orientation Right;Left    Pain Descriptors / Indicators Aching;Tightness   stiffness   Pain Type Chronic pain    Pain Onset More than a month ago             [facility] OT Assessment - 02/15/20 0001      Assessment   Medical Diagnosis R hand RA , stiffness and pain    Referring Provider (OT) Dr Jefm Bryant    Onset Date/Surgical Date 12/18/19    Prior Therapy 2016/2018      AROM  Overall AROM Comments touch palm - increase MC flexion 100 , PIP's 80-90      Strength   Right Hand Grip (lbs) 27    Right Hand Lateral Pinch 10 lbs    Right Hand 3 Point Pinch 9 lbs    Left Hand Grip (lbs) 32    Left Hand Lateral Pinch 12 lbs    Left Hand 3 Point Pinch 9 lbs      Right Hand AROM   R Long  MCP 0-90 --   extention lag MC - subluxation     Left Hand AROM   L Little  MCP 0-90 --   decrease MC extention -tapping                   OT Treatments/Exercises (OP) - 02/15/20 0001      RUE Paraffin   Number Minutes Paraffin 8 Minutes    RUE Paraffin Location Hand    Comments prior to review of HEP - decrese stiffness      LUE Paraffin   Number Minutes Paraffin 8 Minutes    LUE Paraffin Location Hand    Comments prior to review of HEP and decrease stiffness            Moist heat - am and pm prior to ROM  Tendon glides - focus on PIP /intrinsic fist  10 reps  Tapping of digits 10 reps Opposition - to all digits   - focus on  making oval and circle with digit to thumb  6 reps  Joint protection principles  Hand out provided and review        OT Education - 02/15/20 1249    Education provided Yes    Education Details findings of eval and HEP    Person(s) Educated Patient    Methods Explanation;Demonstration;Tactile cues;Verbal cues;Handout    Comprehension Verbal cues required;Returned demonstration;Verbalized understanding            OT Short Term Goals - 05/05/16 1554      OT SHORT TERM GOAL #1   Title Pt wil be Mod I AROM/HEP ex's right hand    Status Achieved      OT SHORT TERM GOAL #2   Title Pt will be Mod I stating 1-2 joint protection techniques that she could perform at home while doing ADL's to minimize stress and pain on joints    Status Achieved             OT Long Term Goals - 02/15/20 1300      OT LONG TERM GOAL #1   Title Pt to be independent in HEP to increase flexion of R 2nd digit- and maintain extention and decrease stiffness in bilateral hands    Baseline Pt show increase MC flexion , decrease PIP flexion - decrease PIP flexion R 2nd , decrease MC extention of R 3rd , L 4th/5th    Time 3    Period Weeks    Status New    Target Date 03/07/20      OT LONG TERM GOAL #2   Title Pt to verbalize 3 joint protection and AE to increase ease of doing task and prevent increase pain and stiffness in digits    Baseline forgot info from 3-6 yrs ago    Time 4    Period Weeks    Status New    Target Date 03/14/20  Plan - 02/15/20 1252    Clinical Impression Statement Pt refer to OT with diagnosis RA with increase hand stfifness , pain and decrease extentoin , decrease 2nd digit flexion on R - pt is R hand dominant    OT Occupational Profile and History Problem Focused Assessment - Including review of records relating to presenting problem    Occupational performance deficits (Please refer to evaluation for details): ADL's;IADL's;Play;Leisure;Social  Participation    Body Structure / Function / Physical Skills ADL;Flexibility;ROM;UE functional use;Dexterity;Pain;Strength;IADL;Decreased knowledge of use of DME    Rehab Potential Good    Clinical Decision Making Limited treatment options, no task modification necessary    Comorbidities Affecting Occupational Performance: May have comorbidities impacting occupational performance    Modification or Assistance to Complete Evaluation  No modification of tasks or assist necessary to complete eval    OT Frequency 1x / week    OT Duration 4 weeks    OT Treatment/Interventions Self-care/ADL training;Paraffin;DME and/or AE instruction;Manual Therapy;Therapeutic activities;Patient/family education;Therapeutic exercise    Plan assess progress with HEP - adjust as needed    OT Home Exercise Plan See pt instruction    Consulted and Agree with Plan of Care Patient           Patient will benefit from skilled therapeutic intervention in order to improve the following deficits and impairments:   Body Structure / Function / Physical Skills: ADL,Flexibility,ROM,UE functional use,Dexterity,Pain,Strength,IADL,Decreased knowledge of use of DME       Visit Diagnosis: Stiffness of left hand, not elsewhere classified - Plan: Ot plan of care cert/re-cert  Stiffness of right hand, not elsewhere classified - Plan: Ot plan of care cert/re-cert  Pain in left hand - Plan: Ot plan of care cert/re-cert  Pain in right hand - Plan: Ot plan of care cert/re-cert    Problem List Patient Active Problem List   Diagnosis Date Noted  . Tardive dyskinesia 01/18/2020  . Bipolar 1 disorder, manic, moderate ([place]) 01/02/2020  . Personal history of affective disorder 09/06/2019  . Bipolar I disorder, most recent episode (or current) manic ([place]) 11/02/2018  . Insomnia due to mental disorder 11/02/2018  . 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/20/2017  . Chest pain 05/17/2015  . OP (osteoporosis) 02/25/2015  . Sjogren's syndrome  ([place]) 02/20/2015  . Combined fat and carbohydrate induced hyperlipemia 10/03/2014  . Breathlessness on exertion 09/24/2014  . Bipolar 1 disorder with moderate mania ([place]) 08/29/2014  . Bipolar disorder, in partial remission, most recent episode manic ([place]) 08/29/2014  . H/O gastrointestinal disease 08/29/2014  . GAD (generalized anxiety disorder) 08/29/2014  . Fibromyalgia 08/29/2014  . H/O elevated lipids 08/29/2014  . H/O: hypothyroidism 08/29/2014  . Insomnia, persistent 08/29/2014  . 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 Chronic pain associated with significant psychosocial dysfunction 08/29/2014  . 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 Rheumatoid arthritis ([place]) 08/29/2014  . Benign essential HTN 01/10/2014  . 3-vessel CAD 01/10/2014  . Polypharmacy 06/29/2013  . High risk medication use 06/29/2013  . Arthritis or polyarthritis, rheumatoid ([place]) 06/22/2013  . Acid reflux 05/05/2012  . Adult hypothyroidism 01/07/2012  . Flu vaccine need 01/07/2012  . Cough 12/14/2011  . CD (contact dermatitis) 10/15/2011  . Finger wound, simple, open 10/15/2011    Rosalyn Gess OTR/L,CLT 02/15/2020, 1:08 PM  [facility] [address] [place], [address] Phone: [phone]   Fax:  [phone]  Name: Stephanie [name] MRN: [id number] Date  of Birth: 1945-07-16

## 2020-02-15 NOTE — Patient Instructions (Signed)
Moist heat - am and pm prior to ROM  Tendon glides - focus on PIP /intrinsic fist  10 reps  Tapping of digits 10 reps Opposition - to all digits   - focus on making oval and circle with digit to thumb  6 reps  Joint protection principles  Hand out provided and review

## 2020-02-17 ENCOUNTER — Other Ambulatory Visit: Payer: Self-pay | Admitting: Psychiatry

## 2020-02-17 DIAGNOSIS — F3174 Bipolar disorder, in full remission, most recent episode manic: Secondary | ICD-10-CM

## 2020-02-20 ENCOUNTER — Other Ambulatory Visit: Payer: Self-pay

## 2020-02-20 ENCOUNTER — Ambulatory Visit: Payer: Medicare PPO | Admitting: Occupational Therapy

## 2020-02-20 DIAGNOSIS — M79641 Pain in right hand: Secondary | ICD-10-CM

## 2020-02-20 DIAGNOSIS — M25641 Stiffness of right hand, not elsewhere classified: Secondary | ICD-10-CM

## 2020-02-20 DIAGNOSIS — M25642 Stiffness of left hand, not elsewhere classified: Secondary | ICD-10-CM

## 2020-02-20 DIAGNOSIS — M79642 Pain in left hand: Secondary | ICD-10-CM

## 2020-02-20 NOTE — Therapy (Signed)
[facility] [address], [place], [address] Phone: [phone]   Fax:  [phone]  Occupational Therapy Treatment  Patient Details  Name: Stephanie [name] MRN: [id number] Date of Birth: Mar 19, 1945 Referring Provider (OT): Dr Jefm Bryant   Encounter Date: 02/20/2020   OT End of Session - 02/20/20 0959    Visit Number 2    Number of Visits 4    Date for OT Re-Evaluation 03/14/20    OT Start Time 1000    OT Stop Time 1046    OT Time Calculation (min) 46 min    Activity Tolerance Patient tolerated treatment well    Behavior During Therapy [facility] for tasks assessed/performed           Past Medical History:  Diagnosis Date  . Anginal pain ([employer])   . Anxiety   . Bipolar 1 disorder ([place])   . Bipolar disorder ([place])   . Coronary artery disease   . Depression   . Fatigue   . Fibromyalgia   . GERD (gastroesophageal reflux disease)   . Heart disease   . Heart failure ([place])   . HLD (hyperlipidemia)   . HOH (hard of hearing)   . Hypothyroidism   . IBS (irritable bowel syndrome)   . Pelvic pain in female   . Perianal lesion    high grade sil lesion- keratinizing type  . RA (rheumatoid arthritis) ([place])   . Raynaud phenomenon   . Sjogren's disease ([place])   . Sleep apnea   . Vaginal Pap smear, abnormal     Past Surgical History:  Procedure Laterality Date  . CATARACT EXTRACTION W/PHACO Left 02/23/2017   Procedure: CATARACT EXTRACTION PHACO AND INTRAOCULAR LENS PLACEMENT (IOC);  Surgeon: Birder Robson, MD;  Location: [facility] ORS;  Service: Ophthalmology;  Laterality: Left;  [place] 00:35 AP% 9.6 CDE 3.47 Fluid pack lot # [id number]  . CATARACT EXTRACTION W/PHACO Right 03/30/2017   Procedure: CATARACT EXTRACTION PHACO AND INTRAOCULAR LENS PLACEMENT ([place]);  Surgeon: Birder Robson, MD;  Location: [facility] ORS;  Service: Ophthalmology;  Laterality: Right;  [place] 00:32.6 AP% 13.8 CDE 4.51 Fluid Pack Lot # [id number]  . EYE  SURGERY    . TONSILLECTOMY      There were no vitals filed for this visit.   Subjective Assessment - 02/20/20 0958    Subjective  I did not paint yet - did not use the built up handles yet - I am having hard time doing the blocking of my fingers for the tips and try to use my paraffin    Patient Stated Goals I want to prevent my my fingers to go sideways - I do not want surgery     Currently in Pain? Yes    Pain Score 7     Pain Location Hand    Pain Orientation Right;Left    Pain Descriptors / Indicators Aching    Pain Type Chronic pain    Pain Onset More than a month ago    Pain Frequency Constant           Pt report increase pain over digits- reviewed HEP - pt was forcing and blocking PIP flexion not correctly  Pt also need review with use of her paraffin bath  - precautions review Pt to replace or add new paraffin - check temperature- ask friend or family to help her check               OT Treatments/Exercises (OP) -  02/20/20 0001      RUE Paraffin   Number Minutes Paraffin 8 Minutes    RUE Paraffin Location Hand    Comments prior to soft tissue and ROM      LUE Paraffin   Number Minutes Paraffin 8 Minutes    LUE Paraffin Location Hand    Comments prior to ROM and soft tissue         soft tissue mobs done to webspace and digits - lateral bands , MC's - light traction to digits prior to AROM    Tendon glides - focus on PIP /intrinsic fist block  - need mod A And reinforce only motion - that "Motion is lotion"   10 reps  Tapping of digits 10 reps - on at time - not all of them at same tim Opposition - to all digits   - focus on making oval and circle with digit to thumb - min A 6 reps  Joint protection principles review again -and respect for pain - if linger for 2 hrs or next day - modify that actvity  Hand out provided and review last time         OT Education - 02/20/20 0958    Education provided Yes    Education Details progress and review  and changes to HEP    Person(s) Educated Patient    Methods Explanation;Demonstration;Tactile cues;Verbal cues;Handout    Comprehension Verbal cues required;Returned demonstration;Verbalized understanding            OT Short Term Goals - 05/05/16 1554      OT SHORT TERM GOAL #1   Title Pt wil be Mod I AROM/HEP ex's right hand    Status Achieved      OT SHORT TERM GOAL #2   Title Pt will be Mod I stating 1-2 joint protection techniques that she could perform at home while doing ADL's to minimize stress and pain on joints    Status Achieved             OT Long Term Goals - 02/15/20 1300      OT LONG TERM GOAL #1   Title Pt to be independent in HEP to increase flexion of R 2nd digit- and maintain extention and decrease stiffness in bilateral hands    Baseline Pt show increase MC flexion , decrease PIP flexion - decrease PIP flexion R 2nd , decrease MC extention of R 3rd , L 4th/5th    Time 3    Period Weeks    Status New    Target Date 03/07/20      OT LONG TERM GOAL #2   Title Pt to verbalize 3 joint protection and AE to increase ease of doing task and prevent increase pain and stiffness in digits    Baseline forgot info from 3-6 yrs ago    Time 4    Period Weeks    Status New    Target Date 03/14/20                 Plan - 02/20/20 0959    Clinical Impression Statement Pt report increase pain this date - review with her HEP - she was doing to much pressure and forcing motion- reviewed and pt verbalize understanding - also used her old paraffin bath with little paraffin - pt to ask friend to help her replace or add new paraffin - review use and precautions - review joint protection and HEP again -demo understaing and showed decrease  pain    OT Occupational Profile and History Problem Focused Assessment - Including review of records relating to presenting problem    Occupational performance deficits (Please refer to evaluation for details):  ADL's;IADL's;Play;Leisure;Social Participation    Body Structure / Function / Physical Skills ADL;Flexibility;ROM;UE functional use;Dexterity;Pain;Strength;IADL;Decreased knowledge of use of DME    Rehab Potential Good    Clinical Decision Making Limited treatment options, no task modification necessary    Comorbidities Affecting Occupational Performance: May have comorbidities impacting occupational performance    Modification or Assistance to Complete Evaluation  No modification of tasks or assist necessary to complete eval    OT Frequency 1x / week    OT Duration 4 weeks    OT Treatment/Interventions Self-care/ADL training;Paraffin;DME and/or AE instruction;Manual Therapy;Therapeutic activities;Patient/family education;Therapeutic exercise    Plan assess progress with HEP - adjust as needed    OT Home Exercise Plan See pt instruction           Patient will benefit from skilled therapeutic intervention in order to improve the following deficits and impairments:   Body Structure / Function / Physical Skills: ADL,Flexibility,ROM,UE functional use,Dexterity,Pain,Strength,IADL,Decreased knowledge of use of DME       Visit Diagnosis: Stiffness of left hand, not elsewhere classified  Stiffness of right hand, not elsewhere classified  Pain in left hand  Pain in right hand    Problem List Patient Active Problem List   Diagnosis Date Noted  . Tardive dyskinesia 01/18/2020  . Bipolar 1 disorder, manic, moderate ([place]) 01/02/2020  . Personal history of affective disorder 09/06/2019  . Bipolar I disorder, most recent episode (or current) manic ([place]) 11/02/2018  . Insomnia due to mental disorder 11/02/2018  . 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/20/2017  . Chest pain 05/17/2015  . OP (osteoporosis) 02/25/2015  . Sjogren's syndrome ([place]) 02/20/2015  . Combined fat and carbohydrate induced hyperlipemia 10/03/2014  . Breathlessness on exertion 09/24/2014  . Bipolar 1 disorder with moderate mania  ([place]) 08/29/2014  . Bipolar disorder, in partial remission, most recent episode manic ([place]) 08/29/2014  . H/O gastrointestinal disease 08/29/2014  . GAD (generalized anxiety disorder) 08/29/2014  . Fibromyalgia 08/29/2014  . H/O elevated lipids 08/29/2014  . H/O: hypothyroidism 08/29/2014  . Insomnia, persistent 08/29/2014  . 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 Chronic pain associated with significant psychosocial dysfunction 08/29/2014  . 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 Rheumatoid arthritis ([place]) 08/29/2014  . Benign essential HTN 01/10/2014  . 3-vessel CAD 01/10/2014  . Polypharmacy 06/29/2013  . High risk medication use 06/29/2013  . Arthritis or polyarthritis, rheumatoid ([place]) 06/22/2013  . Acid reflux 05/05/2012  . Adult hypothyroidism 01/07/2012  . Flu vaccine need 01/07/2012  . Cough 12/14/2011  . CD (contact dermatitis) 10/15/2011  . Finger wound, simple, open 10/15/2011    Rosalyn Gess  OTR/l,CLT 02/20/2020, 2:28 PM  [facility] [address] [place], [address] Phone: [phone]   Fax:  [phone]  Name: Stephanie [name] MRN: [id number] Date of Birth: 11/16/1945

## 2020-02-21 ENCOUNTER — Telehealth (INDEPENDENT_AMBULATORY_CARE_PROVIDER_SITE_OTHER): Payer: Medicare PPO | Admitting: Psychiatry

## 2020-02-21 ENCOUNTER — Encounter: Payer: Self-pay | Admitting: Psychiatry

## 2020-02-21 VITALS — BP 134/84 | HR 79

## 2020-02-21 DIAGNOSIS — Z79899 Other long term (current) drug therapy: Secondary | ICD-10-CM

## 2020-02-21 DIAGNOSIS — F411 Generalized anxiety disorder: Secondary | ICD-10-CM | POA: Diagnosis not present

## 2020-02-21 DIAGNOSIS — G2401 Drug induced subacute dyskinesia: Secondary | ICD-10-CM

## 2020-02-21 DIAGNOSIS — F5105 Insomnia due to other mental disorder: Secondary | ICD-10-CM | POA: Diagnosis not present

## 2020-02-21 DIAGNOSIS — F3174 Bipolar disorder, in full remission, most recent episode manic: Secondary | ICD-10-CM | POA: Diagnosis not present

## 2020-02-21 NOTE — Progress Notes (Signed)
[facility] MD OP Progress Note  02/21/2020 5:28 PM Stephanie [name]  MRN:  [id number]  Chief Complaint:  Chief Complaint    Follow-up     HPI: Stephanie [name] is a 74 year old Caucasian female, divorced, lives in [place], has a history of bipolar disorder, coronary artery disease, fibromyalgia, GERD, hyperlipidemia, Raynaud's phenomena, Sjogren's syndrome, hypothyroidism, IBS, recent fracture of neck currently healed, was evaluated in the office today.  Patient today appeared to be pleasant.  She appeared to be alert, oriented to person place time situation.  Patient did not appear to be manic, her speech was normal, answered all questions appropriately.  Patient reports she does not feel manic anymore and even though she feels hypomanic at times it is manageable.  She denies any significant anxiety or sadness.  She reports she is sleeping okay.  She is currently on Ingrezza which was recently started for tardive dyskinesia.  She reports she is not sure if the medication is already helping or not since she is not very aware about her movements around her mouth.  She however reports it was brought to her attention by her friends.  She reports she is tolerating the Ingrezza well.  It does make her drowsy and hence helps her to sleep well at night.  She denies any suicidality, homicidality or perceptual disturbances.  She continues to be in psychotherapy sessions with [name].  She is scheduled to see her again in January.  Patient reports she is currently getting hand therapy for her right hand, since she struggles with some restricted movements of her right hand due to her rheumatoid arthritis.  Patient however reports she is able to drive okay, she drove here this morning.  She reports she would like to continue to drive since she wants to be independent.  She was released to drive by her surgeon.  Visit Diagnosis:    ICD-10-CM   1. Bipolar disorder, in full remission, most recent  episode manic ([place])  F31.74   2. GAD (generalized anxiety disorder)  F41.1   3. Insomnia due to mental disorder  F51.05   4. Tardive dyskinesia  G24.01   5. High risk medication use  Z79.899 Hepatic function panel    Past Psychiatric History: I have reviewed past psychiatric history from my progress note on 06/17/2017.  Most recent inpatient mental health admission at [facility] behavioral health unit-01/02/2020 - 01/11/2020  Past Medical History:  Past Medical History:  Diagnosis Date  . Anginal pain ([place])   . Anxiety   . Bipolar 1 disorder ([place])   . Bipolar disorder ([place])   . Coronary artery disease   . Depression   . Fatigue   . Fibromyalgia   . GERD (gastroesophageal reflux disease)   . Heart disease   . Heart failure ([place])   . HLD (hyperlipidemia)   . HOH (hard of hearing)   . Hypothyroidism   . IBS (irritable bowel syndrome)   . Pelvic pain in female   . Perianal lesion    high grade sil lesion- keratinizing type  . RA (rheumatoid arthritis) ([place])   . Raynaud phenomenon   . Sjogren's disease ([place])   . Sleep apnea   . Vaginal Pap smear, abnormal     Past Surgical History:  Procedure Laterality Date  . CATARACT EXTRACTION W/PHACO Left 02/23/2017   Procedure: CATARACT EXTRACTION PHACO AND INTRAOCULAR LENS PLACEMENT (IOC);  Surgeon: Birder Robson, MD;  Location: [facility] ORS;  Service: Ophthalmology;  Laterality: Left;  [place] 00:35 AP% 9.6 CDE 3.47 Fluid pack lot # [id number]  . CATARACT EXTRACTION W/PHACO Right 03/30/2017   Procedure: CATARACT EXTRACTION PHACO AND INTRAOCULAR LENS PLACEMENT ([place]);  Surgeon: Birder Robson, MD;  Location: [facility] ORS;  Service: Ophthalmology;  Laterality: Right;  [place] 00:32.6 AP% 13.8 CDE 4.51 Fluid Pack Lot # [id number]  . EYE SURGERY    . TONSILLECTOMY      Family Psychiatric History: I have reviewed family psychiatric history from my progress note on 06/17/2017  Family History:  Family History  Problem Relation  Age of Onset  . Heart failure Father   . Parkinson's disease Mother   . Bipolar disorder Mother   . Depression Sister   . Schizophrenia Paternal Grandmother   . Diabetes Maternal Grandmother   . Pancreatic cancer Maternal Grandmother   . Cancer Neg Hx   . Heart disease Neg Hx   . Breast cancer Neg Hx     Social History: I have reviewed social history from my progress note on 06/17/2017 Social History   Socioeconomic History  . Marital status: Divorced    Spouse name: Not on file  . Number of children: 2  . Years of education: Not on file  . Highest education level: Master's degree (e.g., MA, MS, MEng, MEd, MSW, MBA)  Occupational History    Comment: retired2  Tobacco Use  . Smoking status: Never Smoker  . Smokeless tobacco: Former Network engineer  . Vaping Use: Never used  Substance and Sexual Activity  . Alcohol use: No    Alcohol/week: 0.0 standard drinks  . Drug use: No  . Sexual activity: Never    Birth control/protection: Post-menopausal  Other Topics Concern  . Not on file  Social History Narrative  . Not on file   Social Determinants of Health   Financial Resource Strain: Not on file  Food Insecurity: Not on file  Transportation Needs: Not on file  Physical Activity: Not on file  Stress: Not on file  Social Connections: Not on file    Allergies:  Allergies  Allergen Reactions  . Amoxicillin-Pot Clavulanate Nausea Only and Other (See Comments)    Diarrhea Has patient had a PCN reaction causing immediate rash, facial/tongue/throat swelling, SOB or lightheadedness with hypotension:  Has patient had a PCN reaction causing severe rash involving mucus membranes or skin necrosis: Has patient had a PCN reaction that required hospitalization:  Has patient had a PCN reaction occurring within the last 10 years: If all of the above answers are "NO", then may proceed with Cephalosporin use.   [place] Kitchen Oxycodone-Acetaminophen Nausea Only  . Erythromycin Nausea Only  .  Azithromycin Nausea And Vomiting  . Codeine   . Macrolides And Ketolides   . Oxycodone-Acetaminophen   . Penicillins Other (See Comments)    Has patient had a PCN reaction causing immediate rash, facial/tongue/throat swelling, SOB or lightheadedness with hypotension: NO Has patient had a PCN reaction causing severe rash involving mucus membranes or skin necrosis: NO Has patient had a PCN reaction that required hospitalization: NO Has patient had a PCN reaction occurring within the last 10 years: NO If all of the above answers are "NO", then may proceed with Cephalosporin use.   . Remeron [Mirtazapine]     Side effect-likely tardive dyskinesia  . Tramadol Nausea And Vomiting  . Zolpidem     Sleep walking  . Lamotrigine Rash    LAMICTAL  . Sulfa Antibiotics Rash    Metabolic Disorder  Labs: Lab Results  Component Value Date   HGBA1C 5.3 11/22/2017   Lab Results  Component Value Date   PROLACTIN 33.0 (H) 11/22/2017   Lab Results  Component Value Date   CHOL 222 (H) 01/02/2020   TRIG 190 (H) 01/02/2020   HDL 69 01/02/2020   CHOLHDL 3.2 01/02/2020   VLDL 38 01/02/2020   LDLCALC 115 (H) 01/02/2020   LDLCALC 119 (H) 11/22/2017   Lab Results  Component Value Date   TSH 2.678 01/02/2020    Therapeutic Level Labs: No results found for: LITHIUM Lab Results  Component Value Date   VALPROATE 60 01/02/2020   VALPROATE 61 07/05/2019   No components found for:  CBMZ  Current Medications: Current Outpatient Medications  Medication Sig Dispense Refill  . Acetaminophen 500 MG capsule Take 500 mg by mouth. Takes 500 mg bid prn    . aspirin 81 MG chewable tablet Chew 81 mg by mouth daily with breakfast.    . Calcium Citrate-Vitamin D 200-250 MG-UNIT TABS Take 1 tablet by mouth daily after breakfast.     . carbamazepine (TEGRETOL XR) 200 MG 12 hr tablet TAKE 2 TABLETS BY MOUTH 2 TIMES DAILY 360 tablet 1  . divalproex (DEPAKOTE ER) 500 MG 24 hr tablet Take 1 tablet twice a day  (Patient taking differently: Take 500 mg by mouth 2 (two) times daily. Take 1 tablet twice a day) 180 tablet 1  . fluticasone (FLONASE) 50 MCG/ACT nasal spray     . folic acid (FOLVITE) 1 MG tablet Take 2 mg by mouth daily with breakfast.     . gabapentin (NEURONTIN) 300 MG capsule Take 1 tablet twice a day (Patient taking differently: Take 300 mg by mouth 2 (two) times daily. Take 1 tablet twice a day) 180 capsule 1  . ibandronate (BONIVA) 150 MG tablet     . levothyroxine (SYNTHROID, LEVOTHROID) 75 MCG tablet Take 75 mcg by mouth daily before breakfast.     . methotrexate 2.5 MG tablet Take 15 mg by mouth every Wednesday. WEDNESDAYS AT LUNCH    . mirabegron ER (MYRBETRIQ) 50 MG TB24 tablet Take 1 tablet (50 mg total) by mouth daily. 30 tablet 11  . Multiple Vitamins-Minerals (MULTIVITAMIN WITH MINERALS) tablet Take 1 tablet by mouth daily with breakfast.     . pravastatin (PRAVACHOL) 40 MG tablet Take 40 mg by mouth daily.     . QUEtiapine (SEROQUEL XR) 50 MG TB24 24 hr tablet Take 1 tablet (50 mg total) by mouth at bedtime. 30 tablet 1  . temazepam (RESTORIL) 7.5 MG capsule Take 1 capsule (7.5 mg total) by mouth at bedtime. 30 capsule 1  . Valbenazine Tosylate (INGREZZA) 40 MG CAPS Take 1 capsule (40 mg total) by mouth at bedtime. 30 capsule 1  . nitrofurantoin, macrocrystal-monohydrate, (MACROBID) 100 MG capsule Take 1 capsule (100 mg total) by mouth every 12 (twelve) hours. (Patient not taking: No sig reported) 8 capsule 0   No current facility-administered medications for this visit.     Musculoskeletal: Strength & Muscle Tone: UTA Gait & Station: Walks with cane Patient leans: N/A  Psychiatric Specialty Exam: Review of Systems  Musculoskeletal: Positive for arthralgias.  Psychiatric/Behavioral: Negative for agitation, behavioral problems, confusion, decreased concentration, dysphoric mood, hallucinations, self-injury, sleep disturbance and suicidal ideas. The patient is not  nervous/anxious and is not hyperactive.   All other systems reviewed and are negative.   Blood pressure 134/84, pulse 79.There is no height or weight on file to calculate  BMI.  General Appearance: Casual  Eye Contact:  Fair  Speech:  Clear and Coherent  Volume:  Normal  Mood:  Euthymic  Affect:  Congruent  Thought Process:  Goal Directed and Descriptions of Associations: Intact  Orientation:  Full (Time, Place, and Person)  Thought Content: Logical   Suicidal Thoughts:  No  Homicidal Thoughts:  No  Memory:  Immediate;   Fair Recent;   Fair Remote;   Fair  Judgement:  Fair  Insight:  Fair  Psychomotor Activity:  Normal  Concentration:  Concentration: Fair and Attention Span: Fair  Recall:  [employer] of Knowledge: Fair  Language: Fair  Akathisia:  No  Handed:  Right  AIMS (if indicated): 2 - does have some lip smacking , did not observe any tongue movements today in session, patient mild to minimally aware , not much distress  Assets:  Communication Skills Des[place] for [place] Talents/Skills Transportation Vocational/Educational  ADL's:  Intact  Cognition: WNL  Sleep:  Fair   Screenings: [place] Admission (Discharged) from 01/02/2020 in [facility] Visit from 11/11/2017 in [facility] Total Score 0 0    AUDIT   [place] Admission (Discharged) from 01/02/2020 in [facility]  Alcohol Use Disorder Identification Test Final Score (AUDIT) 0       Assessment and Plan: Stephanie [name] is a 7 year old Caucasian female, divorced, lives in [place], has a history of bipolar disorder, multiple medical problems including recent fracture, recent inpatient mental health admission, UTI was evaluated in office today.  Patient is currently making progress on the current medication regimen.  Plan as noted below.  Plan Bipolar disorder most recent episode  manic in full remission Continue Tegretol extended release 400 mg p.o. twice daily Tegretol level reviewed-01/02/2020-8.8-therapeutic Depakote extended release 500 mg p.o. twice daily Depakote level reviewed 01/02/2019 21-60-therapeutic Gabapentin 300 mg p.o. twice daily Seroquel extended release 50 mg p.o. nightly  GAD-stable Continue CBT with [name] Gabapentin 300 mg p.o. twice daily  Insomnia-improving Temazepam 7.5 mg p.o. nightly Continue Seroquel extended release 50 mg p.o. nightly I have reviewed [place] controlled substance database.  TD-improving On evaluation today there was no tongue thrusting or lip movement observed. Continue Ingrezza 40 mg p.o. nightly Patient aware about the effect of Ingrezza on her cardiac health including QT syndrome, hepatic function especially in combination with her her other medications like carbamazepine.  Also carbamazepine can lower the level of Ingrezza and this was also discussed with patient. Will not make dosage readjustment today.  We will monitor closely. Will consider getting an EKG to monitor her QTC in the future.  We will order hepatic function panel to monitor her liver function since she is on multiple medications.  Follow-up in clinic in 1 to 2 months or sooner if needed.  I have spent atleast 30 minutes face to face  with patient today. More than 50 % of the time was spent for preparing to see the patient ( e.g., review of test, records ), obtaining and to review and separately obtained history , ordering medications and test ,psychoeducation and supportive psychotherapy and care coordination,as well as documenting clinical information in electronic health record,interpreting and communication of test results This note was generated in part or whole with voice recognition software. Voice recognition is usually quite accurate but there are transcription errors that can and very often do occur. I apologize for any typographical  errors that were not detected and corrected.       Ursula Alert, MD 02/21/2020, 5:28 PM

## 2020-02-22 ENCOUNTER — Ambulatory Visit: Payer: Medicare PPO | Admitting: Urology

## 2020-02-29 ENCOUNTER — Telehealth: Payer: Self-pay | Admitting: Psychiatry

## 2020-02-29 DIAGNOSIS — F3174 Bipolar disorder, in full remission, most recent episode manic: Secondary | ICD-10-CM

## 2020-02-29 DIAGNOSIS — F5105 Insomnia due to other mental disorder: Secondary | ICD-10-CM

## 2020-02-29 DIAGNOSIS — F411 Generalized anxiety disorder: Secondary | ICD-10-CM

## 2020-02-29 MED ORDER — TEMAZEPAM 7.5 MG PO CAPS: 7.5000 mg | ORAL_CAPSULE | Freq: Every day | ORAL | 1 refills | Status: DC

## 2020-02-29 MED ORDER — QUETIAPINE FUMARATE ER 50 MG PO TB24: 50.0000 mg | ORAL_TABLET | Freq: Every day | ORAL | 1 refills | Status: DC

## 2020-02-29 NOTE — Telephone Encounter (Signed)
I have sent temazepam, Seroquel to pharmacy for future refill since clinic is closed the next few days.

## 2020-03-04 ENCOUNTER — Telehealth: Payer: Self-pay

## 2020-03-04 NOTE — Telephone Encounter (Signed)
Pt LM on triage line stating that she received a bill for $40 which is her typical co-pay,however pt states that she paid co-pay via credit card at the time of her visit. She believes this is in relation to visit from 12/9. Can you look into this please.

## 2020-03-05 ENCOUNTER — Ambulatory Visit: Payer: Medicare PPO | Admitting: Occupational Therapy

## 2020-03-11 ENCOUNTER — Telehealth: Payer: Self-pay | Admitting: *Deleted

## 2020-03-11 DIAGNOSIS — G2401 Drug induced subacute dyskinesia: Secondary | ICD-10-CM

## 2020-03-11 MED ORDER — INGREZZA 40 MG PO CAPS: 40.0000 mg | ORAL_CAPSULE | Freq: Every day | ORAL | 5 refills | Status: DC

## 2020-03-11 NOTE — Telephone Encounter (Signed)
Please let us work on getting this affordable for her.

## 2020-03-11 NOTE — Telephone Encounter (Signed)
Patient called and needs and refill of Ingrezza. She stated it is really helping with TD.  Her next appt is 04/19/20.  Please review.

## 2020-03-11 NOTE — Telephone Encounter (Signed)
I'm on it.

## 2020-03-11 NOTE — Telephone Encounter (Signed)
Patient called back after finding out the cost of her co pay on Ingrezza and stated she could not afford it.  She stated it was helping some but not enough to offset the cost.  I explained to her that the full effect of the drug may not be realized for several weeks but she still felt the cost was prohibitive.  I spoke with the pharmacist and we discussed patient assistance.  He said she would not qualify because she has insurance.  I called patient back and she said she would consider a less expensive medication.  I tried to get her to schedule an earlier appt to discuss but she did not want to because she has several other appts and she stated she has big medical bills and doesn't want to incur more bills. I told her I would call her back tomorrow after you reviewed her chart.  The pharmacy put the Ingrezza on hold.

## 2020-03-11 NOTE — Telephone Encounter (Signed)
I have sent Ingrezza to [facility].

## 2020-03-11 NOTE — Telephone Encounter (Signed)
Thank you :)

## 2020-03-12 ENCOUNTER — Other Ambulatory Visit: Payer: Self-pay | Admitting: Psychiatry

## 2020-03-12 ENCOUNTER — Telehealth: Payer: Self-pay | Admitting: *Deleted

## 2020-03-12 NOTE — Telephone Encounter (Signed)
Thank you :)

## 2020-03-12 NOTE — Telephone Encounter (Signed)
Made call to Ingrezza "patient assistance".  Discussed decreased co pay for Ingrezza with Beverly Sessions the representative.  He is going to discuss with pharmacy and work on lowering co pay for patient.  The representative for [facility] will bring samples for patient tomorrow.

## 2020-03-12 NOTE — Telephone Encounter (Signed)
You are welcome

## 2020-03-13 LAB — HEPATIC FUNCTION PANEL
ALT: 16 IU/L (ref 0–32)
AST: 25 IU/L (ref 0–40)
Albumin: 4.3 g/dL (ref 3.7–4.7)
Alkaline Phosphatase: 65 IU/L (ref 44–121)
Bilirubin Total: 0.2 mg/dL (ref 0.0–1.2)
Bilirubin, Direct: <0.1 mg/dL (ref 0.00–0.40)
Total Protein: 6.7 g/dL (ref 6.0–8.5)

## 2020-03-13 NOTE — Telephone Encounter (Signed)
Medication management - Called pt to let her know they have 2 weeks of samples, #14 pills for her to pick up as pt stated she may not be able to come by the office for them until next week due to some other appointments. Patient stated she would come as soon as she could and did not mean to be putting this off but just has some other medical appointments already scheduled.  Informed her the medications would be left for her to pick up and to call if any problems.  Also, informed Dr. Elna Breslow would be getting more samples in for patient for later and the office would keep her informed.

## 2020-03-14 ENCOUNTER — Other Ambulatory Visit: Payer: Self-pay

## 2020-03-14 ENCOUNTER — Ambulatory Visit: Payer: Medicare PPO | Attending: Rheumatology | Admitting: Occupational Therapy

## 2020-03-14 DIAGNOSIS — M25641 Stiffness of right hand, not elsewhere classified: Secondary | ICD-10-CM | POA: Insufficient documentation

## 2020-03-14 DIAGNOSIS — M25642 Stiffness of left hand, not elsewhere classified: Secondary | ICD-10-CM | POA: Insufficient documentation

## 2020-03-14 DIAGNOSIS — M79642 Pain in left hand: Secondary | ICD-10-CM | POA: Diagnosis present

## 2020-03-14 DIAGNOSIS — M79641 Pain in right hand: Secondary | ICD-10-CM | POA: Insufficient documentation

## 2020-03-14 NOTE — Telephone Encounter (Signed)
Please enter the lot # and amount of pills in the sample to her chart- before giving it to her . That way we can keep track.

## 2020-03-14 NOTE — Therapy (Signed)
[facility] [address] [place], [address] Phone: [phone]   Fax:  [phone]  Occupational Therapy Treatment  Patient Details  Name: Stephanie [name] MRN: [id number] Date of Birth: [birth year] Referring Provider (OT): Dr Jefm Bryant   Encounter Date: 03/14/2020   OT End of Session - 03/14/20 1043    Visit Number 3    Number of Visits 4    Date for OT Re-Evaluation 03/14/20    OT Start Time 1033    OT Stop Time 1120    OT Time Calculation (min) 47 min    Activity Tolerance Patient tolerated treatment well    Behavior During Therapy [facility] for tasks assessed/performed           Past Medical History:  Diagnosis Date  . Anginal pain ([place])   . Anxiety   . Bipolar 1 disorder ([place])   . Bipolar disorder ([place])   . Coronary artery disease   . Depression   . Fatigue   . Fibromyalgia   . GERD (gastroesophageal reflux disease)   . Heart disease   . Heart failure ([place])   . HLD (hyperlipidemia)   . HOH (hard of hearing)   . Hypothyroidism   . IBS (irritable bowel syndrome)   . Pelvic pain in female   . Perianal lesion    high grade sil lesion- keratinizing type  . RA (rheumatoid arthritis) ([place])   . Raynaud phenomenon   . Sjogren's disease ([place])   . Sleep apnea   . Vaginal Pap smear, abnormal     Past Surgical History:  Procedure Laterality Date  . CATARACT EXTRACTION W/PHACO Left 02/23/2017   Procedure: CATARACT EXTRACTION PHACO AND INTRAOCULAR LENS PLACEMENT (IOC);  Surgeon: Birder Robson, MD;  Location: [facility] ORS;  Service: Ophthalmology;  Laterality: Left;  [place] 00:35 AP% 9.6 CDE 3.47 Fluid pack lot # [id number]  . CATARACT EXTRACTION W/PHACO Right 03/30/2017   Procedure: CATARACT EXTRACTION PHACO AND INTRAOCULAR LENS PLACEMENT ([place]);  Surgeon: Birder Robson, MD;  Location: [facility] ORS;  Service: Ophthalmology;  Laterality: Right;  [place] 00:32.6 AP% 13.8 CDE 4.51 Fluid Pack Lot # [id number]  . EYE  SURGERY    . TONSILLECTOMY      There were no vitals filed for this visit.   Subjective Assessment - 03/14/20 1039    Subjective  I did not use my paraffin - my thermomether I don't know if that will work - can you check- I did not do my exercises as much because of holidays    Patient Stated Goals I want to prevent my my fingers to go sideways - I do not want surgery     Currently in Pain? Yes    Pain Score 7     Pain Location Hand    Pain Orientation Right;Left    Pain Descriptors / Indicators Aching    Pain Type Chronic pain    Pain Onset More than a month ago    Pain Frequency Constant                Pt report cont pain in hands - but with holidays - did not do her HEP as much Brought her thermometer for OT to check - will not work - more for medical  Will check for another one  In meantime pt to use warm water prior to ROM and to decrease pain and stiffness           OT  Treatments/Exercises (OP) - 03/14/20 0001      RUE Paraffin   Number Minutes Paraffin 8 Minutes    RUE Paraffin Location Hand    Comments prior to soft tissue      LUE Paraffin   Number Minutes Paraffin 8 Minutes    LUE Paraffin Location Hand    Comments prior to soft tissue and ROM           soft tissue to webspace and digits - lateral bands , MC's - light traction to digits prior to AROM    Tendon glides - focus on PIP /intrinsic fist block  - need min A And reinforce only motion - that "Motion is lotion"   10 reps  Tapping of digits 10 reps - one at time - not all of them at same time And fitted this date with Oval 8 splint on R 3rd to decrease hyper extention of PIP - risk for swanneck contracture- adjusted dorsal parts Opposition - to all digits - focus on making oval and circle with digit to thumb - min A 6 reps  Joint protection principles review again -and respect for pain - if linger for 2 hrs or next day - modify that actvity  Hand out provided and reviewat  eval          OT Education - 03/14/20 1043    Education provided Yes    Education Details review and changes to HEP, joint protection and splint use    Person(s) Educated Patient    Methods Explanation;Demonstration;Tactile cues;Verbal cues;Handout    Comprehension Verbal cues required;Returned demonstration;Verbalized understanding            OT Short Term Goals - 05/05/16 1554      OT SHORT TERM GOAL #1   Title Pt wil be Mod I AROM/HEP ex's right hand    Status Achieved      OT SHORT TERM GOAL #2   Title Pt will be Mod I stating 1-2 joint protection techniques that she could perform at home while doing ADL's to minimize stress and pain on joints    Status Achieved             OT Long Term Goals - 02/15/20 1300      OT LONG TERM GOAL #1   Title Pt to be independent in HEP to increase flexion of R 2nd digit- and maintain extention and decrease stiffness in bilateral hands    Baseline Pt show increase MC flexion , decrease PIP flexion - decrease PIP flexion R 2nd , decrease MC extention of R 3rd , L 4th/5th    Time 3    Period Weeks    Status New    Target Date 03/07/20      OT LONG TERM GOAL #2   Title Pt to verbalize 3 joint protection and AE to increase ease of doing task and prevent increase pain and stiffness in digits    Baseline forgot info from 3-6 yrs ago    Time 4    Period Weeks    Status New    Target Date 03/14/20                 Plan - 03/14/20 1247    Clinical Impression Statement Pt cont to have pain 5-7/10 in bilateral hands - not using her paraffin - unsure about heat - will look into getting her thermometer- pt to use warm water and review again joint protection , gentle AROM -  not forcing and then fitted on R 3rd PIP oval 8 splint to prevent swanneck deformity - 3rd his her worse hand that is subluxed at [facility] with ulnar deviation - other digits do hyper extend at PIP but 3rd the worse - pt to cont 1 x wk    OT Occupational Profile and  History Problem Focused Assessment - Including review of records relating to presenting problem    Occupational performance deficits (Please refer to evaluation for details): ADL's;IADL's;Play;Leisure;Social Participation    Body Structure / Function / Physical Skills ADL;Flexibility;ROM;UE functional use;Dexterity;Pain;Strength;IADL;Decreased knowledge of use of DME    Rehab Potential Good    Clinical Decision Making Limited treatment options, no task modification necessary    Comorbidities Affecting Occupational Performance: May have comorbidities impacting occupational performance    Modification or Assistance to Complete Evaluation  No modification of tasks or assist necessary to complete eval    OT Frequency 1x / week    OT Duration 4 weeks    OT Treatment/Interventions Self-care/ADL training;Paraffin;DME and/or AE instruction;Manual Therapy;Therapeutic activities;Patient/family education;Therapeutic exercise    Plan assess progress with HEP - adjust as needed    OT Home Exercise Plan See pt instruction    Consulted and Agree with Plan of Care Patient           Patient will benefit from skilled therapeutic intervention in order to improve the following deficits and impairments:   Body Structure / Function / Physical Skills: ADL,Flexibility,ROM,UE functional use,Dexterity,Pain,Strength,IADL,Decreased knowledge of use of DME       Visit Diagnosis: Stiffness of left hand, not elsewhere classified  Stiffness of right hand, not elsewhere classified  Pain in left hand  Pain in right hand    Problem List Patient Active Problem List   Diagnosis Date Noted  . Tardive dyskinesia 01/18/2020  . Bipolar 1 disorder, manic, moderate (HCC) 01/02/2020  . Personal history of affective disorder 09/06/2019  . Bipolar I disorder, most recent episode (or current) manic (HCC) 11/02/2018  . Insomnia due to mental disorder 11/02/2018  . 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/20/2017  . Chest pain 05/17/2015   . OP (osteoporosis) 02/25/2015  . Sjogren's syndrome (HCC) 02/20/2015  . Combined fat and carbohydrate induced hyperlipemia 10/03/2014  . Breathlessness on exertion 09/24/2014  . Bipolar 1 disorder with moderate mania (HCC) 08/29/2014  . Bipolar disorder, in partial remission, most recent episode manic (HCC) 08/29/2014  . H/O gastrointestinal disease 08/29/2014  . GAD (generalized anxiety disorder) 08/29/2014  . Fibromyalgia 08/29/2014  . H/O elevated lipids 08/29/2014  . H/O: hypothyroidism 08/29/2014  . Insomnia, persistent 08/29/2014  . 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 Chronic pain associated with significant psychosocial dysfunction 08/29/2014  . 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 Rheumatoid arthritis (HCC) 08/29/2014  . Benign essential HTN 01/10/2014  . 3-vessel CAD 01/10/2014  . Polypharmacy 06/29/2013  . High risk medication use 06/29/2013  . Arthritis or polyarthritis, rheumatoid (HCC) 06/22/2013  . Acid reflux 05/05/2012  . Adult hypothyroidism 01/07/2012  . Flu vaccine need 01/07/2012  . Cough 12/14/2011  . CD (contact dermatitis) 10/15/2011  . Finger wound, simple, open 10/15/2011    Oletta Cohn OTR/L,CLT 03/14/2020, 12:52 PM  [facility] [address] [place], [address] Phone: [phone]   Fax:  [phone]  Name: Stephanie [name] MRN: [id number] Date of Birth: December 02, 1945

## 2020-03-19 ENCOUNTER — Telehealth: Payer: Self-pay | Admitting: *Deleted

## 2020-03-19 NOTE — Telephone Encounter (Signed)
Patient called and reported that she had gained 10 lbs since she has began Ingrezza.  She had read this was a side effect and was upset about this.  She stated also that she had not seen any discernable results that she felt were enough reason to take the medication if it is causing her to gain weight.  She plans to discuss this with you. Please review.

## 2020-03-20 NOTE — Telephone Encounter (Signed)
Returned call to patient to discuss her concern.  Patient picked up the phone and said she just got out of the shower and is dripping wet.  Discussed with patient that writer will call her back once she is settled down.

## 2020-03-20 NOTE — Telephone Encounter (Signed)
Returned call to patient again at 49 PM. Patient reports she may have gotten exposed to COVID-19 and had testing done, awaiting results.  She does not have any major symptoms other than fatigue.  She reports she is worried about Ingrezza likely causing weight gain.  She may have gained 10 pounds since the past few weeks.  She reports Ingrezza as helpful to some extent with her tardive dyskinesia.  Discussed with patient that weight gain likely could be from the Seroquel.  Seroquel related weight gain is not dose-dependent.  Discussed with patient although Julio Alm can cause weight gain the risk is not as much as the Seroquel.  Patient wants to stay on the Ingrezza as well as the Seroquel at this time.  She is planning to get more active once she feels better.  She will call back with concerns as needed.

## 2020-03-21 ENCOUNTER — Encounter: Payer: Medicare PPO | Admitting: Occupational Therapy

## 2020-03-21 NOTE — Telephone Encounter (Signed)
Placed call to patient to inform of samples ready for pick up tomorrow.  One months worth is available.

## 2020-03-26 ENCOUNTER — Ambulatory Visit: Payer: Medicare PPO | Admitting: Occupational Therapy

## 2020-04-15 ENCOUNTER — Other Ambulatory Visit: Payer: Self-pay | Admitting: Family Medicine

## 2020-04-15 DIAGNOSIS — Z1231 Encounter for screening mammogram for malignant neoplasm of breast: Secondary | ICD-10-CM

## 2020-04-19 ENCOUNTER — Other Ambulatory Visit: Payer: Self-pay

## 2020-04-19 ENCOUNTER — Telehealth (INDEPENDENT_AMBULATORY_CARE_PROVIDER_SITE_OTHER): Payer: Medicare PPO | Admitting: Psychiatry

## 2020-04-19 ENCOUNTER — Encounter: Payer: Self-pay | Admitting: Psychiatry

## 2020-04-19 VITALS — BP 130/75 | HR 87 | Temp 97.3°F

## 2020-04-19 DIAGNOSIS — F5101 Primary insomnia: Secondary | ICD-10-CM

## 2020-04-19 DIAGNOSIS — F411 Generalized anxiety disorder: Secondary | ICD-10-CM

## 2020-04-19 DIAGNOSIS — F3174 Bipolar disorder, in full remission, most recent episode manic: Secondary | ICD-10-CM

## 2020-04-19 DIAGNOSIS — G2401 Drug induced subacute dyskinesia: Secondary | ICD-10-CM | POA: Diagnosis not present

## 2020-04-19 MED ORDER — TEMAZEPAM 7.5 MG PO CAPS: 7.5000 mg | ORAL_CAPSULE | Freq: Every day | ORAL | 2 refills | Status: DC

## 2020-04-19 MED ORDER — GABAPENTIN 300 MG PO CAPS: ORAL_CAPSULE | ORAL | 1 refills | Status: DC

## 2020-04-19 MED ORDER — QUETIAPINE FUMARATE ER 50 MG PO TB24: 50.0000 mg | ORAL_TABLET | Freq: Every day | ORAL | 2 refills | Status: DC

## 2020-04-19 MED ORDER — CARBAMAZEPINE ER 200 MG PO TB12: 400.0000 mg | ORAL_TABLET | Freq: Two times a day (BID) | ORAL | 1 refills | Status: DC

## 2020-04-19 MED ORDER — DIVALPROEX SODIUM ER 500 MG PO TB24: ORAL_TABLET | ORAL | 1 refills | Status: DC

## 2020-04-19 NOTE — Progress Notes (Addendum)
[facility] MD OP Progress Note  04/19/2020 1:39 PM Stephanie [name]  MRN:  [id number]  Chief Complaint:  Chief Complaint    Follow-up     HPI: Stephanie [name] is a 75 year old Caucasian female, divorced, lives in [place], has a history of bipolar disorder, coronary artery disease, fibromyalgia, GERD, hyperlipidemia, Raynaud's phenomena, Sjogren's syndrome, hypothyroidism, IBS was evaluated in the office today.  Patient today presented to the evaluation in a wheelchair.  Patient reports she was able to get access to the wheelchair since she did not want to walk all the way to the clinic today.    Patient reports overall she has been doing okay.  Denies any significant depressive symptoms.  Denies any significant anxiety, hypomania or manic symptoms.  She reports she is compliant on her medications.  She denies any side effects to her medications.  Patient reports she is tolerating the Ingrezza.  She reports she does have blurry vision and is worried that the Ingrezza could make it worse.  She however is agreeable to staying on the [place] for now since she wants to give it a chance.  In session today patient was evaluated for TD, patient with history of tongue thrusting, lip smacking, she did not have any lip smacking today, minimal tongue thrusting observed, has improved since her last visit.  She denies any abnormal involuntary movements anywhere else.  Patient denies any suicidality, homicidality or perceptual disturbances.  Patient reports sleep is good on the temazepam.  She is also on Seroquel.  Patient is aware about the long-term side effects of Seroquel.  Reviewed and discussed most recent hemoglobin A1c, lipid panel as well as liver function.  Discussed the effect of Seroquel on her cholesterol as well as hemoglobin A1c.  Patient however wants to stay on the Seroquel at this time since it is beneficial.  Patient reports psychotherapy sessions as going well.  She had an appointment with Ms.  Miguel Dibble recently.  Patient also reports she is planning to get out and exercise and walk more once the weather warms up.  She currently does tai chi which helps her joints.  She does have a history of rheumatoid arthritis.  Patient does have problems with with her mobility of her hands.  She however reports she is still able to drive.  Her friend does help her to get her pills out from her bubble pack since she cannot do it herself.  She continues to have good social support system.  Visit Diagnosis:    ICD-10-CM   1. Bipolar disorder, in full remission, most recent episode manic (HCC)  F31.74 QUEtiapine (SEROQUEL XR) 50 MG TB24 24 hr tablet    gabapentin (NEURONTIN) 300 MG capsule    divalproex (DEPAKOTE ER) 500 MG 24 hr tablet    carbamazepine (TEGRETOL XR) 200 MG 12 hr tablet  2. GAD (generalized anxiety disorder)  F41.1 QUEtiapine (SEROQUEL XR) 50 MG TB24 24 hr tablet  3. Primary insomnia  F51.01 temazepam (RESTORIL) 7.5 MG capsule  4. Tardive dyskinesia  G24.01     Past Psychiatric History: I have reviewed past psychiatric history from my progress note on 06/17/2017.  Most recent inpatient mental health admission at [facility]-01/02/2020-01/11/2020.  Past Medical History:  Past Medical History:  Diagnosis Date  . Anginal pain ([place])   . Anxiety   . Bipolar 1 disorder ([place])   . Bipolar disorder ([place])   . Coronary artery disease   . Depression   . Fatigue   .  Fibromyalgia   . GERD (gastroesophageal reflux disease)   . Heart disease   . Heart failure ([place])   . HLD (hyperlipidemia)   . HOH (hard of hearing)   . Hypothyroidism   . IBS (irritable bowel syndrome)   . Pelvic pain in female   . Perianal lesion    high grade sil lesion- keratinizing type  . RA (rheumatoid arthritis) ([place])   . Raynaud phenomenon   . Sjogren's disease ([place])   . Sleep apnea   . Vaginal Pap smear, abnormal     Past Surgical History:  Procedure Laterality Date  . CATARACT  EXTRACTION W/PHACO Left 02/23/2017   Procedure: CATARACT EXTRACTION PHACO AND INTRAOCULAR LENS PLACEMENT (IOC);  Surgeon: Birder Robson, MD;  Location: [facility] ORS;  Service: Ophthalmology;  Laterality: Left;  [place] 00:35 AP% 9.6 CDE 3.47 Fluid pack lot # [id number]  . CATARACT EXTRACTION W/PHACO Right 03/30/2017   Procedure: CATARACT EXTRACTION PHACO AND INTRAOCULAR LENS PLACEMENT ([place]);  Surgeon: Birder Robson, MD;  Location: [facility] ORS;  Service: Ophthalmology;  Laterality: Right;  [place] 00:32.6 AP% 13.8 CDE 4.51 Fluid Pack Lot # [id number]  . EYE SURGERY    . TONSILLECTOMY      Family Psychiatric History: I have reviewed family psychiatric history from my progress note on 06/17/2017.  Family History:  Family History  Problem Relation Age of Onset  . Heart failure Father   . Parkinson's disease Mother   . Bipolar disorder Mother   . Depression Sister   . Schizophrenia Paternal Grandmother   . Diabetes Maternal Grandmother   . Pancreatic cancer Maternal Grandmother   . Cancer Neg Hx   . Heart disease Neg Hx   . Breast cancer Neg Hx     Social History: Reviewed social history from my progress note on 06/17/2017. Social History   Socioeconomic History  . Marital status: Divorced    Spouse name: Not on file  . Number of children: 2  . Years of education: Not on file  . Highest education level: Master's degree (e.g., MA, MS, MEng, MEd, MSW, MBA)  Occupational History    Comment: retired2  Tobacco Use  . Smoking status: Never Smoker  . Smokeless tobacco: Former Network engineer  . Vaping Use: Never used  Substance and Sexual Activity  . Alcohol use: No    Alcohol/week: 0.0 standard drinks  . Drug use: No  . Sexual activity: Never    Birth control/protection: Post-menopausal  Other Topics Concern  . Not on file  Social History Narrative  . Not on file   Social Determinants of Health   Financial Resource Strain: Not on file  Food Insecurity: Not on file  Transportation  Needs: Not on file  Physical Activity: Not on file  Stress: Not on file  Social Connections: Not on file    Allergies:  Allergies  Allergen Reactions  . Amoxicillin-Pot Clavulanate Nausea Only and Other (See Comments)    Diarrhea Has patient had a PCN reaction causing immediate rash, facial/tongue/throat swelling, SOB or lightheadedness with hypotension:  Has patient had a PCN reaction causing severe rash involving mucus membranes or skin necrosis: Has patient had a PCN reaction that required hospitalization:  Has patient had a PCN reaction occurring within the last 10 years: If all of the above answers are "NO", then may proceed with Cephalosporin use.   Marland Kitchen Oxycodone-Acetaminophen Nausea Only  . Erythromycin Nausea Only  . Azithromycin Nausea And Vomiting  . Codeine   . Macrolides  And Ketolides   . Oxycodone-Acetaminophen   . Penicillins Other (See Comments)    Has patient had a PCN reaction causing immediate rash, facial/tongue/throat swelling, SOB or lightheadedness with hypotension: NO Has patient had a PCN reaction causing severe rash involving mucus membranes or skin necrosis: NO Has patient had a PCN reaction that required hospitalization: NO Has patient had a PCN reaction occurring within the last 10 years: NO If all of the above answers are "NO", then may proceed with Cephalosporin use.   . Remeron [Mirtazapine]     Side effect-likely tardive dyskinesia  . Tramadol Nausea And Vomiting  . Zolpidem     Sleep walking  . Lamotrigine Rash    LAMICTAL  . Sulfa Antibiotics Rash    Metabolic Disorder Labs: Lab Results  Component Value Date   HGBA1C 5.3 11/22/2017   Lab Results  Component Value Date   PROLACTIN 33.0 (H) 11/22/2017   Lab Results  Component Value Date   CHOL 222 (H) 01/02/2020   TRIG 190 (H) 01/02/2020   HDL 69 01/02/2020   CHOLHDL 3.2 01/02/2020   VLDL 38 01/02/2020   LDLCALC 115 (H) 01/02/2020   LDLCALC 119 (H) 11/22/2017   Lab Results   Component Value Date   TSH 2.678 01/02/2020    Therapeutic Level Labs: No results found for: LITHIUM Lab Results  Component Value Date   VALPROATE 60 01/02/2020   VALPROATE 61 07/05/2019   No components found for:  CBMZ  Current Medications: Current Outpatient Medications  Medication Sig Dispense Refill  . Acetaminophen 500 MG capsule Take 500 mg by mouth. Takes 500 mg bid prn    . aspirin 81 MG chewable tablet Chew 81 mg by mouth daily with breakfast.    . Calcium Citrate-Vitamin D 200-250 MG-UNIT TABS Take 1 tablet by mouth daily after breakfast.     . fluticasone (FLONASE) 50 MCG/ACT nasal spray     . folic acid (FOLVITE) 1 MG tablet Take 2 mg by mouth daily with breakfast.     . ibandronate (BONIVA) 150 MG tablet     . levothyroxine (SYNTHROID, LEVOTHROID) 75 MCG tablet Take 75 mcg by mouth daily before breakfast.     . methotrexate 2.5 MG tablet Take 15 mg by mouth every Wednesday. WEDNESDAYS AT LUNCH    . mirabegron ER (MYRBETRIQ) 50 MG TB24 tablet Take 1 tablet (50 mg total) by mouth daily. 30 tablet 11  . Multiple Vitamins-Minerals (MULTIVITAMIN WITH MINERALS) tablet Take 1 tablet by mouth daily with breakfast.     . nitrofurantoin, macrocrystal-monohydrate, (MACROBID) 100 MG capsule Take 1 capsule (100 mg total) by mouth every 12 (twelve) hours. 8 capsule 0  . pravastatin (PRAVACHOL) 40 MG tablet Take 40 mg by mouth daily.     [place] Tosylate (INGREZZA) 40 MG CAPS Take 1 capsule (40 mg total) by mouth at bedtime. 30 capsule 5  . carbamazepine (TEGRETOL XR) 200 MG 12 hr tablet Take 2 tablets (400 mg total) by mouth 2 (two) times daily. 360 tablet 1  . divalproex (DEPAKOTE ER) 500 MG 24 hr tablet Take 1 tablet twice a day 180 tablet 1  . gabapentin (NEURONTIN) 300 MG capsule Take 1 tablet twice a day 180 capsule 1  . QUEtiapine (SEROQUEL XR) 50 MG TB24 24 hr tablet Take 1 tablet (50 mg total) by mouth at bedtime. 30 tablet 2  . temazepam (RESTORIL) 7.5 MG capsule Take  1 capsule (7.5 mg total) by mouth at bedtime. 30 capsule  2   No current facility-administered medications for this visit.     Musculoskeletal: Strength & Muscle Tone: UTA Gait & Station: normal Patient leans: N/A  Psychiatric Specialty Exam: Review of Systems  Eyes: Positive for visual disturbance (following up with her eye provider).  Musculoskeletal: Positive for arthralgias (stable).  Psychiatric/Behavioral: Negative for agitation, behavioral problems, confusion, decreased concentration, dysphoric mood, self-injury, sleep disturbance and suicidal ideas. The patient is not nervous/anxious and is not hyperactive.   All other systems reviewed and are negative.   Blood pressure 130/75, pulse 87, temperature (!) 97.3 F (36.3 C), temperature source Temporal.There is no height or weight on file to calculate BMI.  General Appearance: Casual  Eye Contact:  Fair  Speech:  Clear and Coherent  Volume:  Normal  Mood:  Euthymic  Affect:  Congruent  Thought Process:  Goal Directed and Descriptions of Associations: Intact  Orientation:  Full (Time, Place, and Person)  Thought Content: Logical   Suicidal Thoughts:  No  Homicidal Thoughts:  No  Memory:  Immediate;   Fair Recent;   Fair Remote;   Fair  Judgement:  Fair  Insight:  Fair  Psychomotor Activity:  Normal  Concentration:  Concentration: Fair and Attention Span: Fair  Recall:  [employer] of Knowledge: Fair  Language: Fair  Akathisia:  No  Handed:  Right  AIMS (if indicated): No lip smacking, minimal tongue thrusting,no other abnormal movements  Assets:  Communication Skills Desire for Improvement Housing Social Support  ADL's:  Intact  Cognition: WNL  Sleep:  Fair   Screenings: AIMS   [place] Admission (Discharged) from 01/02/2020 in [facility] Visit from 11/11/2017 in [facility] Total Score 0 0    AUDIT   Flowsheet Row Admission (Discharged)  from 01/02/2020 in [facility]  Alcohol Use Disorder Identification Test Final Score (AUDIT) 0    Flowsheet Row Admission (Discharged) from 01/02/2020 in [facility] No Risk       Assessment and Plan: Stephanie [name] is a 89 year old Caucasian female, divorced, lives in [place], has a history of bipolar disorder, multiple medical problems including recent inpatient mental health admission, recurrent UTIs was evaluated in office today.  Patient is currently stable on current medication regimen.  Plan as noted below.  Plan Bipolar disorder recent episode manic in full remission Tegretol extended release 400 mg p.o. twice daily Tegretol level-01/02/2020-8.8-therapeutic Depakote extended release 500 mg p.o. twice daily Depakote level-01/02/2020-60-therapeutic Gabapentin 300 mg p.o. twice daily Seroquel extended release 50 mg p.o. nightly.  Long-term plan is to taper her off of the Seroquel.  GAD-stable Continue CBT with [name] Continue gabapentin 300 mg p.o. twice daily  Insomnia-stable Temazepam 7.5 mg p.o. nightly Seroquel extended release 50 mg p.o. nightly  TD-improving Continue Ingrezza 40 mg p.o. nightly Will not make any dosage readjustment today.  High risk medication use-reviewed and discussed hepatic function panel-03/12/2020-within normal limits. I have also reviewed hemoglobin A1c-5.7 slightly elevated dated 04/09/2020, cholesterol-elevated at 224-discussed exercise, dietary modification.  We will monitor closely.  She will also continue to follow-up with her primary care provider.  Follow-up in clinic in 2 months or sooner if needed.  I have spent atleast 20 minutes face to face with patient today. More than 50 % of the time was spent for preparing to see the patient ( e.g., review of test, records ),  ordering medications and test ,psychoeducation and  supportive psychotherapy and care  coordination,as well as documenting clinical information in electronic health record,interpreting and communication of test results This note was generated in part or whole with voice recognition software. Voice recognition is usually quite accurate but there are transcription errors that can and very often do occur. I apologize for any typographical errors that were not detected and corrected.     Ursula Alert, MD 04/19/2020, 1:39 PM

## 2020-05-06 ENCOUNTER — Telehealth: Payer: Self-pay | Admitting: Urology

## 2020-05-06 NOTE — Telephone Encounter (Signed)
Pt called and says Myrbetriq is working for her, but it's too expensive and she wants to know if she can switch to Oxybutnin.

## 2020-05-07 NOTE — Telephone Encounter (Signed)
Pt LMOM and said to disregard the fax from [facility] requesting to change her meds.

## 2020-05-08 ENCOUNTER — Encounter: Payer: Self-pay | Admitting: Dermatology

## 2020-05-08 ENCOUNTER — Ambulatory Visit (INDEPENDENT_AMBULATORY_CARE_PROVIDER_SITE_OTHER): Payer: Medicare PPO | Admitting: Dermatology

## 2020-05-08 ENCOUNTER — Other Ambulatory Visit: Payer: Self-pay

## 2020-05-08 DIAGNOSIS — D229 Melanocytic nevi, unspecified: Secondary | ICD-10-CM

## 2020-05-08 DIAGNOSIS — L821 Other seborrheic keratosis: Secondary | ICD-10-CM

## 2020-05-08 DIAGNOSIS — Z1283 Encounter for screening for malignant neoplasm of skin: Secondary | ICD-10-CM

## 2020-05-08 DIAGNOSIS — D692 Other nonthrombocytopenic purpura: Secondary | ICD-10-CM

## 2020-05-08 DIAGNOSIS — L578 Other skin changes due to chronic exposure to nonionizing radiation: Secondary | ICD-10-CM | POA: Diagnosis not present

## 2020-05-08 DIAGNOSIS — L853 Xerosis cutis: Secondary | ICD-10-CM | POA: Diagnosis not present

## 2020-05-08 DIAGNOSIS — L814 Other melanin hyperpigmentation: Secondary | ICD-10-CM

## 2020-05-08 DIAGNOSIS — D1800 Hemangioma unspecified site: Secondary | ICD-10-CM

## 2020-05-08 NOTE — Progress Notes (Signed)
   Follow-Up Visit   Subjective  Stephanie [name] is a 75 y.o. female who presents for the following: Total body skin exam (No hx of skin ca). The patient presents for Total-Body Skin Exam (TBSE) for skin cancer screening and mole check.  The following portions of the chart were reviewed this encounter and updated as appropriate:   Tobacco  Allergies  Meds  Problems  Med Hx  Surg Hx  Fam Hx     Review of Systems:  No other skin or systemic complaints except as noted in HPI or Assessment and Plan.  Objective  Well appearing patient in no apparent distress; mood and affect are within normal limits.  A full examination was performed including scalp, head, eyes, ears, nose, lips, neck, chest, axillae, abdomen, back, buttocks, bilateral upper extremities, bilateral lower extremities, hands, feet, fingers, toes, fingernails, and toenails. All findings within normal limits unless otherwise noted below.  Objective  trunk, extremities: Xerosis trunk, extremities    Assessment & Plan    Lentigines - Scattered tan macules - Due to sun exposure - Benign-appering, observe - Recommend daily broad spectrum sunscreen SPF 30+ to sun-exposed areas, reapply every 2 hours as needed. - Call for any changes  Seborrheic Keratoses - Stuck-on, waxy, tan-brown papules and plaques  - Discussed benign etiology and prognosis. - Observe - Call for any changes  Melanocytic Nevi - Tan-brown and/or pink-flesh-colored symmetric macules and papules - Benign appearing on exam today - Observation - Call clinic for new or changing moles - Recommend daily use of broad spectrum spf 30+ sunscreen to sun-exposed areas.   Hemangiomas - Red papules - Discussed benign nature - Observe - Call for any changes  Actinic Damage - Chronic, secondary to cumulative UV/sun exposure - diffuse scaly erythematous macules with underlying dyspigmentation - Recommend daily broad spectrum sunscreen SPF 30+ to  sun-exposed areas, reapply every 2 hours as needed.  - Call for new or changing lesions.  Skin cancer screening performed today.  Purpura - Chronic; persistent and recurrent.  Treatable, but not curable. - Violaceous macules and patches - Benign - Related to trauma, age, sun damage and/or use of blood thinners, chronic use of topical and/or oral steroids - Observe - Can use OTC arnica containing moisturizer such as Dermend Bruise Formula if desired - Call for worsening or other concerns  Xerosis cutis trunk, extremities  Cont Dove soap for sensitive skin Cont Cerave cream qd  Skin cancer screening  Return in about 1 year (around 05/08/2021) for TBSE.  I, Othelia Pulling, RMA, am acting as scribe for Sarina Ser, MD .  Documentation: I have reviewed the above documentation for accuracy and completeness, and I agree with the above.  Sarina Ser, MD

## 2020-05-08 NOTE — Telephone Encounter (Signed)
Patient states the Myrbetriq is going to cost her 40.00. She states this is to expensive and her pharmacist recommended Vesicare.

## 2020-05-08 NOTE — Telephone Encounter (Signed)
Pt states it's too expensive and would like to speak to someone about this.  Please advise.

## 2020-05-09 ENCOUNTER — Other Ambulatory Visit: Payer: Self-pay

## 2020-05-09 ENCOUNTER — Ambulatory Visit: Payer: Medicare PPO | Admitting: Psychiatry

## 2020-05-09 ENCOUNTER — Encounter: Payer: Self-pay | Admitting: Psychiatry

## 2020-05-09 VITALS — BP 115/74 | HR 86 | Temp 97.8°F | Wt 106.8 lb

## 2020-05-09 DIAGNOSIS — F411 Generalized anxiety disorder: Secondary | ICD-10-CM

## 2020-05-09 DIAGNOSIS — F5101 Primary insomnia: Secondary | ICD-10-CM

## 2020-05-09 DIAGNOSIS — G2401 Drug induced subacute dyskinesia: Secondary | ICD-10-CM

## 2020-05-09 DIAGNOSIS — R419 Unspecified symptoms and signs involving cognitive functions and awareness: Secondary | ICD-10-CM

## 2020-05-09 DIAGNOSIS — F3174 Bipolar disorder, in full remission, most recent episode manic: Secondary | ICD-10-CM | POA: Diagnosis not present

## 2020-05-09 NOTE — Telephone Encounter (Signed)
The Vesicare is in the anticholinergic family which is not recommended for individuals over the age of 9 due to his harsh side effects of dry eye, dry mouth, constipation and worsening memory issues.  If she is wanting to try the generic Vesicare in hopes that it will still control her urinary issues and hopefully she does not experience any of the side effects, we can send the prescription to her pharmacy.

## 2020-05-09 NOTE — Progress Notes (Unsigned)
[place] MD OP Progress Note  05/09/2020 6:22 PM Stephanie [name]  MRN:  [id number]  Chief Complaint:  Chief Complaint    Follow-up; Memory Loss     HPI: Stephanie [name] is a 75 year old Caucasian female, divorced, lives in [place], has a history of bipolar disorder, GAD, primary insomnia, tardive dyskinesia, Sjogren's syndrome, Raynaud's phenomena, hypothyroidism, IBS, hyperlipidemia, fibromyalgia, GERD was evaluated in office today.  Patient today presented to the evaluation in a wheelchair.  Patient today reports she has been concerned about her memory changes.  She reports this may have started once she started using Ingrezza.  Patient has tapered herself off of the Ingrezza since her last visit with Probation officer on 04/19/2020.  Patient today appeared to be alert, oriented to person place time and situation.  Her immediate memory as well as recent and remote memory seemed to be intact.  She did well with 3 word recall immediately and after 5 minutes.  Patient was able to do subtraction.  An MMSE was completed today and she scored 30 out of 30.  Patient does report recent sadness since she spends a lot of time alone.  The pandemic does make things hard for her.  She does keep in touch with her sister, her children as well as a few friends.  Patient however reports she is currently looking into moving into a senior living community.  Patient reports she has been having some trouble with [employer] program recently since some days it is hard to eat their food.  The meat they provide can be hard for her to cut due to her hands deformity from rheumatoid arthritis as well as her inability to chew it.  She reports it is possible she could be eating poorly some days and she does feel sluggish, decrease in energy level some days.  Patient reports sleep is overall okay on the Seroquel and temazepam.  She however reports she has to get up at night to urinate due to her bladder problem.  Her provider recently started  her on Myrbetriq however that is quite expensive.  She is currently trying to work with her provider for an alternate to medication.  Patient reported she had some suicidal ideation recently however when writer clarified it with patient she reported it was more so her thoughts about wanting to be with other people and not wanting to be alone.  She did not express any current suicidality or suicidal plan.  Patient denies any hallucinations.  Patient denies any other concerns today.  Visit Diagnosis:    ICD-10-CM   1. Bipolar disorder, in full remission, most recent episode manic ([place])  F31.74   2. GAD (generalized anxiety disorder)  F41.1   3. Primary insomnia  F51.01   4. Tardive dyskinesia  G24.01   5. Cognitive complaints  R41.9 Vitamin B12    VITAMIN D 25 Hydroxy (Vit-D Deficiency, Fractures)    Past Psychiatric History: I have reviewed past psychiatric history from my progress note on 06/17/2017.  Most recent inpatient mental health admission at [facility] 01/02/2020-01/11/2020.  Past Medical History:  Past Medical History:  Diagnosis Date  . Anginal pain ([place])   . Anxiety   . Bipolar 1 disorder ([place])   . Bipolar disorder ([place])   . Coronary artery disease   . Depression   . Fatigue   . Fibromyalgia   . GERD (gastroesophageal reflux disease)   . Heart disease   . Heart failure ([place])   .  HLD (hyperlipidemia)   . HOH (hard of hearing)   . Hypothyroidism   . IBS (irritable bowel syndrome)   . Pelvic pain in female   . Perianal lesion    high grade sil lesion- keratinizing type  . RA (rheumatoid arthritis) ([place])   . Raynaud phenomenon   . Sjogren's disease ([place])   . Sleep apnea   . Vaginal Pap smear, abnormal     Past Surgical History:  Procedure Laterality Date  . CATARACT EXTRACTION W/PHACO Left 02/23/2017   Procedure: CATARACT EXTRACTION PHACO AND INTRAOCULAR LENS PLACEMENT (IOC);  Surgeon: Birder Robson, MD;  Location: [facility] ORS;  Service:  Ophthalmology;  Laterality: Left;  [place] 00:35 AP% 9.6 CDE 3.47 Fluid pack lot # [id number]  . CATARACT EXTRACTION W/PHACO Right 03/30/2017   Procedure: CATARACT EXTRACTION PHACO AND INTRAOCULAR LENS PLACEMENT ([place]);  Surgeon: Birder Robson, MD;  Location: [facility] ORS;  Service: Ophthalmology;  Laterality: Right;  [place] 00:32.6 AP% 13.8 CDE 4.51 Fluid Pack Lot # [id number]  . EYE SURGERY    . TONSILLECTOMY      Family Psychiatric History: I have reviewed family psychiatric history from my progress note on 06/17/2017.  Family History:  Family History  Problem Relation Age of Onset  . Heart failure Father   . Parkinson's disease Mother   . Bipolar disorder Mother   . Depression Sister   . Schizophrenia Paternal Grandmother   . Diabetes Maternal Grandmother   . Pancreatic cancer Maternal Grandmother   . Cancer Neg Hx   . Heart disease Neg Hx   . Breast cancer Neg Hx     Social History: I have reviewed social history from my progress note on 06/17/2017. Social History   Socioeconomic History  . Marital status: Divorced    Spouse name: Not on file  . Number of children: 2  . Years of education: Not on file  . Highest education level: Master's degree (e.g., MA, MS, MEng, MEd, MSW, MBA)  Occupational History    Comment: retired2  Tobacco Use  . Smoking status: Never Smoker  . Smokeless tobacco: Former Network engineer  . Vaping Use: Never used  Substance and Sexual Activity  . Alcohol use: No    Alcohol/week: 0.0 standard drinks  . Drug use: No  . Sexual activity: Never    Birth control/protection: Post-menopausal  Other Topics Concern  . Not on file  Social History Narrative  . Not on file   Social Determinants of Health   Financial Resource Strain: Not on file  Food Insecurity: Not on file  Transportation Needs: Not on file  Physical Activity: Not on file  Stress: Not on file  Social Connections: Not on file    Allergies:  Allergies  Allergen Reactions  .  Amoxicillin-Pot Clavulanate Nausea Only and Other (See Comments)    Diarrhea Has patient had a PCN reaction causing immediate rash, facial/tongue/throat swelling, SOB or lightheadedness with hypotension:  Has patient had a PCN reaction causing severe rash involving mucus membranes or skin necrosis: Has patient had a PCN reaction that required hospitalization:  Has patient had a PCN reaction occurring within the last 10 years: If all of the above answers are "NO", then may proceed with Cephalosporin use.   Marland Kitchen Oxycodone-Acetaminophen Nausea Only  . Erythromycin Nausea Only  . Azithromycin Nausea And Vomiting  . Codeine   . Macrolides And Ketolides   . Oxycodone-Acetaminophen   . Penicillins Other (See Comments)    Has patient had a  PCN reaction causing immediate rash, facial/tongue/throat swelling, SOB or lightheadedness with hypotension: NO Has patient had a PCN reaction causing severe rash involving mucus membranes or skin necrosis: NO Has patient had a PCN reaction that required hospitalization: NO Has patient had a PCN reaction occurring within the last 10 years: NO If all of the above answers are "NO", then may proceed with Cephalosporin use.   . Remeron [Mirtazapine]     Side effect-likely tardive dyskinesia  . Tramadol Nausea And Vomiting  . Zolpidem     Sleep walking  . Lamotrigine Rash    LAMICTAL  . Sulfa Antibiotics Rash    Metabolic Disorder Labs: Lab Results  Component Value Date   HGBA1C 5.3 11/22/2017   Lab Results  Component Value Date   PROLACTIN 33.0 (H) 11/22/2017   Lab Results  Component Value Date   CHOL 222 (H) 01/02/2020   TRIG 190 (H) 01/02/2020   HDL 69 01/02/2020   CHOLHDL 3.2 01/02/2020   VLDL 38 01/02/2020   LDLCALC 115 (H) 01/02/2020   LDLCALC 119 (H) 11/22/2017   Lab Results  Component Value Date   TSH 2.678 01/02/2020    Therapeutic Level Labs: No results found for: LITHIUM Lab Results  Component Value Date   VALPROATE 60  01/02/2020   VALPROATE 61 07/05/2019   No components found for:  CBMZ  Current Medications: Current Outpatient Medications  Medication Sig Dispense Refill  . Acetaminophen 500 MG capsule Take 500 mg by mouth. Takes 500 mg bid prn    . aspirin 81 MG chewable tablet Chew 81 mg by mouth daily with breakfast.    . Calcium Citrate-Vitamin D 200-250 MG-UNIT TABS Take 1 tablet by mouth daily after breakfast.     . carbamazepine (TEGRETOL XR) 200 MG 12 hr tablet Take 2 tablets (400 mg total) by mouth 2 (two) times daily. 360 tablet 1  . divalproex (DEPAKOTE ER) 500 MG 24 hr tablet Take 1 tablet twice a day 180 tablet 1  . fluticasone (FLONASE) 50 MCG/ACT nasal spray     . folic acid (FOLVITE) 1 MG tablet Take 2 mg by mouth daily with breakfast.     . gabapentin (NEURONTIN) 300 MG capsule Take 1 tablet twice a day 180 capsule 1  . ibandronate (BONIVA) 150 MG tablet     . levothyroxine (SYNTHROID, LEVOTHROID) 75 MCG tablet Take 75 mcg by mouth daily before breakfast.     . methotrexate 2.5 MG tablet Take 15 mg by mouth every Wednesday. WEDNESDAYS AT LUNCH    . mirabegron ER (MYRBETRIQ) 50 MG TB24 tablet Take 1 tablet (50 mg total) by mouth daily. 30 tablet 11  . Multiple Vitamins-Minerals (MULTIVITAMIN WITH MINERALS) tablet Take 1 tablet by mouth daily with breakfast.     . nitrofurantoin, macrocrystal-monohydrate, (MACROBID) 100 MG capsule Take 1 capsule (100 mg total) by mouth every 12 (twelve) hours. 8 capsule 0  . pravastatin (PRAVACHOL) 40 MG tablet Take 40 mg by mouth daily.     . QUEtiapine (SEROQUEL XR) 50 MG TB24 24 hr tablet Take 1 tablet (50 mg total) by mouth at bedtime. 30 tablet 2  . temazepam (RESTORIL) 7.5 MG capsule Take 1 capsule (7.5 mg total) by mouth at bedtime. 30 capsule 2  . Valbenazine Tosylate (INGREZZA) 40 MG CAPS Take 1 capsule (40 mg total) by mouth at bedtime. 30 capsule 5   No current facility-administered medications for this visit.     Musculoskeletal: Strength  & Muscle Tone: UTA  Gait & Station: Wheel chair bound Patient leans: N/A  Psychiatric Specialty Exam: Review of Systems  Neurological: Positive for tremors (chronic).  Psychiatric/Behavioral: The patient is nervous/anxious.   All other systems reviewed and are negative.   Blood pressure 115/74, pulse 86, temperature 97.8 F (36.6 C), temperature source Temporal, weight 106 lb 12.8 oz (48.4 kg).Body mass index is 20.86 kg/m.  General Appearance: Casual  Eye Contact:  Fair  Speech:  Clear and Coherent  Volume:  Normal  Mood:  Anxious  Affect:  Congruent  Thought Process:  Goal Directed and Descriptions of Associations: Intact  Orientation:  Full (Time, Place, and Person)  Thought Content: Logical   Suicidal Thoughts:  No  Homicidal Thoughts:  No  Memory:  Immediate;   Fair Recent;   Fair Remote;   Fair  Judgement:  Fair  Insight:  Fair  Psychomotor Activity:  Normal  Concentration:  Concentration: Fair and Attention Span: Fair  Recall:  [employer] of Knowledge: Fair  Language: Fair  Akathisia:  No  Handed:  Right  AIMS (if indicated): patient does have movements of her tongue  Assets:  Communication Skills Desire for Improvement Housing Social Support  ADL's:  Intact  Cognition: WNL  Sleep:  Fair   Screenings: AIMS   [place] Admission (Discharged) from 01/02/2020 in [place] Visit from 11/11/2017 in [facility] [place] Total Score 0 0    AUDIT   Flowsheet Row Admission (Discharged) from 01/02/2020 in [place]  Alcohol Use Disorder Identification Test Final Score (AUDIT) 0    PHQ2-9   [place] Office Visit from 05/09/2020 in [place] Video Visit from 04/19/2020 in [place]  PHQ-2 Total Score 3 0  PHQ-9 Total Score 6 -    [place] Visit from 05/09/2020 in [place]  Video Visit from 04/19/2020 in [place] Admission (Discharged) from 01/02/2020 in [facility] Error: Q3, 4, or 5 should not be populated when Q2 is No No Risk       Assessment and Plan: Stephanie [name] is a 79 year old Caucasian female, divorced, lives in [place], has a history of bipolar disorder, multiple medical problems including recent inpatient mental health admission, presented to the clinic for evaluation of memory changes since being on her medication call Ingrezza.  Discussed plan as noted below.  Plan Bipolar disorder most recent episode manic in full remission Tegretol extended release 400 mg p.o. twice daily Tegretol level-01/02/2020-8.8-therapeutic Depakote extended release 500 mg p.o. twice daily Depakote level-01/02/2020-01/02/2020-60-therapeutic Gabapentin 300 mg p.o. twice daily Seroquel extended release 50 mg p.o. nightly.  Long-term plan is to taper her off.  GAD-Unstable Will not make any medication changes since she is already on polypharmacy today.  Her anxiety symptoms mostly situational.  Patient agrees to have more frequent psychotherapy sessions. Continue CBT with [name] Continue gabapentin 300 mg p.o. twice daily  Insomnia-stable Temazepam 7.5 mg p.o. nightly Seroquel extended release 50 mg p.o. nightly  TD -unstable Ingrezza was tapered off due to adverse side effects of memory changes.  Cognitive changes-an MMSE was completed today in session.  Patient scored 30 out of 30. Patient also was able to answer questions appropriately in session.  Patient likely with recent anxiety about the pandemic, also eating poorly because of inability to eat certain type of food provided by [employer]  program. Discussed diet management, incorporating shakes like boost, Ensure, Glucerna into her diet the days that she feels she is not eating well. Also she will benefit from the  following labs-vitamin B12, vitamin D.  I do not see a recent one done per her medical record. I have printed out lab order that she can go to [facility] or get it done at her primary care office.  I have contacted Ms. Tina Thompson-therapist-left voicemail to provide patient with a sooner appointment for psychotherapy session.  She will benefit from continued CBT.  Safety plan discussed with patient. Patient to call sister, friends, go to the nearest emergency department if she has any suicidal thoughts.  Follow-up in clinic in person in 3 to 4 weeks or sooner if needed.  I have spent atleast 35 minutes with patient today which includes the time spent for preparing to see the patient ( e.g., review of test, records ), obtaining and to review and separately obtained history , ordering medications and test ,psychoeducation and supportive psychotherapy and care coordination,as well as documenting clinical information in electronic health record.  This note was generated in part or whole with voice recognition software. Voice recognition is usually quite accurate but there are transcription errors that can and very often do occur. I apologize for any typographical errors that were not detected and corrected.         Ursula Alert, MD 05/10/2020, 9:31 AM

## 2020-05-09 NOTE — Telephone Encounter (Signed)
Pt Stephanie [name] on triage line stating that she has left messages in regards to changing her myrbetriq to vesicare generic, she would like to know what she can do. Please advise.

## 2020-05-09 NOTE — Telephone Encounter (Signed)
Patient notified and voiced understanding. She will stay on Myrbetriq.

## 2020-05-11 LAB — VITAMIN D 25 HYDROXY (VIT D DEFICIENCY, FRACTURES): Vit D, 25-Hydroxy: 45.4 ng/mL (ref 30.0–100.0)

## 2020-05-11 LAB — VITAMIN B12: Vitamin B-12: 1244 pg/mL (ref 232–1245)

## 2020-05-13 ENCOUNTER — Telehealth: Payer: Self-pay

## 2020-05-13 NOTE — Telephone Encounter (Signed)
fax on  05-09-20 @ 2:57 pm labwork order for b12 and vitamin dx: r41.9 to [place],  [phone]

## 2020-05-13 NOTE — Telephone Encounter (Signed)
Yes please let he know I agree with that , but she will have to talk to her Primary care provider to order that. Thank you

## 2020-05-13 NOTE — Telephone Encounter (Signed)
pt called she left a message that she wanted to speak with you about if you though it would help her to have someone come to her house for about 3 hours a day for 5 days a week?

## 2020-05-14 NOTE — Telephone Encounter (Signed)
Pt was called earlier and told that it was ok with dr. Shea Evans but to also let her primary care provider know also.

## 2020-05-17 ENCOUNTER — Other Ambulatory Visit: Payer: Self-pay

## 2020-05-17 ENCOUNTER — Ambulatory Visit
Admission: RE | Admit: 2020-05-17 | Discharge: 2020-05-17 | Disposition: A | Discharge status: Home / Self Care | Payer: Medicare PPO | Source: Ambulatory Visit | Attending: Family Medicine | Admitting: Family Medicine

## 2020-05-17 DIAGNOSIS — Z1231 Encounter for screening mammogram for malignant neoplasm of breast: Secondary | ICD-10-CM | POA: Diagnosis not present

## 2020-05-20 ENCOUNTER — Telehealth: Payer: Self-pay

## 2020-05-20 NOTE — Telephone Encounter (Signed)
pt called left message that she just wanted you to know that her son was able to get a home health nurse to come to her house for a few hours a week.

## 2020-05-21 ENCOUNTER — Telehealth: Payer: Self-pay

## 2020-05-21 NOTE — Telephone Encounter (Signed)
pt daughter called states that her mom is not doing well and needed to speak with you about her medications. and a plan of action.

## 2020-05-21 NOTE — Telephone Encounter (Signed)
Spoke to patient.  She reports she is trying to get home instead a chance.  They came in for an evaluation and will be coming tomorrow and Friday.  They are going to be coming 3 days a week Mondays, Wednesdays and Fridays.  She reports she believes her depression is more situational.  She continues to have difficulty taking her meals because she is unable to cut it into small pieces as well as when she cuts it she has difficulty chewing it.  She has been supplementing Ensure which helps to some extent.  She however reports even though she does not have a great appetite she has been eating enough and making sure she eats majority of the food that [employer] provides.  Contacted daughter-Heather-returned her call.  Nira Conn raised concern that she is not sure whether her mother is getting more depressed or not.  Some of her friends have raised concern.  Her mother is calling crisis line more often.  She however does not think she is suicidal.  Discussed with Nira Conn concerns about patient not eating enough and she is having difficulty cutting her food that Meals on Wheels provide.  She is also isolated.  Discussed with her that patient wants to give it more time before making more med changes.Discussed that she wants to try to change her situational stressors before making more med changes.  This was conveyed to the daughter.  Daughter agrees with plan.  She is going to check into what can be done about her meals and also to get her more companionship and get her into more activities if possible.  Daughter also believes she may need someone to drive her to places since she has trouble driving.  Daughter also agrees to get in touch with Otila Kluver Thompson-therapist.  Also discussed [facility] program.  I have texted the phone number using doximity app.

## 2020-05-30 ENCOUNTER — Encounter: Payer: Self-pay | Admitting: Psychiatry

## 2020-05-30 ENCOUNTER — Telehealth (HOSPITAL_COMMUNITY): Payer: Self-pay | Admitting: Psychiatry

## 2020-05-30 ENCOUNTER — Telehealth: Payer: Self-pay

## 2020-05-30 ENCOUNTER — Other Ambulatory Visit: Payer: Self-pay

## 2020-05-30 ENCOUNTER — Ambulatory Visit (INDEPENDENT_AMBULATORY_CARE_PROVIDER_SITE_OTHER): Payer: Medicare PPO | Admitting: Psychiatry

## 2020-05-30 VITALS — BP 133/87 | HR 87 | Temp 97.9°F | Wt 104.6 lb

## 2020-05-30 DIAGNOSIS — F411 Generalized anxiety disorder: Secondary | ICD-10-CM | POA: Diagnosis not present

## 2020-05-30 DIAGNOSIS — F5101 Primary insomnia: Secondary | ICD-10-CM | POA: Diagnosis not present

## 2020-05-30 DIAGNOSIS — G2401 Drug induced subacute dyskinesia: Secondary | ICD-10-CM

## 2020-05-30 DIAGNOSIS — F3131 Bipolar disorder, current episode depressed, mild: Secondary | ICD-10-CM

## 2020-05-30 MED ORDER — QUETIAPINE FUMARATE ER 50 MG PO TB24: 100.0000 mg | ORAL_TABLET | Freq: Every day | ORAL | 1 refills | Status: DC

## 2020-05-30 MED ORDER — TEMAZEPAM 7.5 MG PO CAPS: 7.5000 mg | ORAL_CAPSULE | Freq: Every evening | ORAL | 2 refills | Status: DC | PRN

## 2020-05-30 NOTE — Telephone Encounter (Signed)
D:  Dr. Shea Evans referred pt to [place].  A:  Placed call to orient pt, but there was no answer.  Left vm for pt to call the case manager back.  Inform Dr. Shea Evans.

## 2020-05-30 NOTE — Telephone Encounter (Signed)
Jess could you please clarify with pharmacy , she has refills available for Temazepam or Restoril . That is why I did not send it. I just want her to use it as needed and not every night since she is on higher dose of seroquel. Please let her know.

## 2020-05-30 NOTE — Telephone Encounter (Signed)
pt states that she not doing good.  she states that she is having alot of anxiety. and her depression is up and down. she was wanting to know if she can get something for the anxiety. Her anxiety is really bad.

## 2020-05-30 NOTE — Telephone Encounter (Signed)
rx did not go threw to pharamcy  temazepam (RESTORIL) 7.5 MG capsule Medication Date: 05/30/2020 Department: [facility] Ordering/Authorizing: Ursula Alert, MD    Order Providers  Prescribing Provider Encounter Provider  Ursula Alert, MD Ursula Alert, MD   Outpatient Medication Detail   Disp Refills Start End   temazepam (RESTORIL) 7.5 MG capsule 30 capsule 2 05/30/2020    Sig - Route: Take 1 capsule (7.5 mg total) by mouth at bedtime as needed for sleep. - Oral   Class: No Print   Notes to Pharmacy: FUTURE REFILL , PLEASE PLACE ON FILE    Associated Diagnoses  Primary insomnia      Pharmacy  [place], [place]

## 2020-05-30 NOTE — Telephone Encounter (Signed)
D:  Dr. Shea Evans referred pt to Stephanie [name].  A:  Contacted pt a second time to orient her.  Pt states she isn't good with technology at all.  Case manager attempted to explain the link, etc.; pt stated she didn't understand even what a link was; wasn't quite sure what her email address is.  Pt gave case manager to speak to her daughter tomorrow.  Will talk to daughter tomorrow. Inform Dr. Shea Evans.

## 2020-05-30 NOTE — Telephone Encounter (Signed)
Pt was made an appt for today in the office.

## 2020-05-30 NOTE — Progress Notes (Signed)
[facility] MD OP Progress Note  05/30/2020 12:59 PM Stephanie [name]  MRN:  [id number]  Chief Complaint:  Chief Complaint    Follow-up; Anxiety; Depression     HPI: Stephanie [name] is a 75 year old Caucasian female, divorced, lives in [place], has a history of bipolar disorder, GAD, primary insomnia, tardive dyskinesia, Sjogren's syndrome, Raynaud's phenomena, hypothyroidism, IBS, hyperlipidemia, fibromyalgia, GERD was evaluated in office today.  Patient presented in a wheelchair.  Patient today reports she has been feeling depressed and anxious since the past few days.  She reports because of her anxiety she finds it hard to eat.  She was already struggling with low appetite, previously had reported this was due to not being able to eat the food provided by [employer] program.  She had reported that she has difficulty chopping her food due to her disability of her hands due to rheumatoid arthritis.  However today patient reports she is anxious a lot and this makes it hard for her to eat at times.  She however reports that her church has provided her food.  They provided her food on Tuesday and she is also going to get a portion of that today.  She reports she looks forward to having that food and is very grateful that her church is trying to help her out.    Patient reports sleep overall as okay.  She goes to bed at around 9 or 9:30 PM and wakes up around 5:30 AM.  She reports the combination of medication does help her to sleep.  She does have to wake up to urinate however that is not until early morning.  She is currently on Myrbetriq and has upcoming appointment with the urologist.  Patient did appear to be tearful during session today when she talked about her faith and how she tries to strengthen herself by repeating memory verses from the Bible aloud. patient reports this is actually helpful for her when she does that.  She reports few days ago she felt like she was a burden, taking up space and had  thoughts of not wanting to be here.  Patient however reports she thought about what that will do to her family if she does something to herself.  Patient also reports she was able to distract herself and she did not dwell on these thoughts.  She currently denies any suicidality.  She denies any hallucinations.  She has friends and family whom she can reach out to as well as a therapist if she has any suicidal thoughts in the future and she is aware of these.  She also knows she can go to the nearest emergency department if she needs to get admitted to the hospital.  Patient is compliant on her medications.  She does have home health aide coming in 3 times a week.  Patient denies any other concerns today.  Visit Diagnosis:    ICD-10-CM   1. Mild depressed bipolar 1 disorder (HCC)  F31.31 QUEtiapine (SEROQUEL XR) 50 MG TB24 24 hr tablet  2. GAD (generalized anxiety disorder)  F41.1 QUEtiapine (SEROQUEL XR) 50 MG TB24 24 hr tablet  3. Primary insomnia  F51.01 temazepam (RESTORIL) 7.5 MG capsule  4. Tardive dyskinesia  G24.01     Past Psychiatric History: I have reviewed past psychiatric history from my progress note on 06/17/2017.  Most recent inpatient mental health admission at [facility] 01/02/2020-01/11/2020  Past Medical History:  Past Medical History:  Diagnosis Date  .  Anginal pain ([place])   . Anxiety   . Bipolar 1 disorder ([place])   . Bipolar disorder ([place])   . Coronary artery disease   . Depression   . Fatigue   . Fibromyalgia   . GERD (gastroesophageal reflux disease)   . Heart disease   . Heart failure ([place])   . HLD (hyperlipidemia)   . HOH (hard of hearing)   . Hypothyroidism   . IBS (irritable bowel syndrome)   . Pelvic pain in female   . Perianal lesion    high grade sil lesion- keratinizing type  . RA (rheumatoid arthritis) ([place])   . Raynaud phenomenon   . Sjogren's disease ([place])   . Sleep apnea   . Vaginal Pap smear, abnormal     Past Surgical  History:  Procedure Laterality Date  . CATARACT EXTRACTION W/PHACO Left 02/23/2017   Procedure: CATARACT EXTRACTION PHACO AND INTRAOCULAR LENS PLACEMENT (IOC);  Surgeon: Birder Robson, MD;  Location: [facility] ORS;  Service: Ophthalmology;  Laterality: Left;  [place] 00:35 AP% 9.6 CDE 3.47 Fluid pack lot # [id number]  . CATARACT EXTRACTION W/PHACO Right 03/30/2017   Procedure: CATARACT EXTRACTION PHACO AND INTRAOCULAR LENS PLACEMENT ([place]);  Surgeon: Birder Robson, MD;  Location: [facility] ORS;  Service: Ophthalmology;  Laterality: Right;  [place] 00:32.6 AP% 13.8 CDE 4.51 Fluid Pack Lot # [id number]  . EYE SURGERY    . TONSILLECTOMY      Family Psychiatric History: Reviewed family psychiatric history from my progress note on 06/17/2017  Family History:  Family History  Problem Relation Age of Onset  . Heart failure Father   . Parkinson's disease Mother   . Bipolar disorder Mother   . Depression Sister   . Schizophrenia Paternal Grandmother   . Diabetes Maternal Grandmother   . Pancreatic cancer Maternal Grandmother   . Cancer Neg Hx   . Heart disease Neg Hx   . Breast cancer Neg Hx     Social History: Reviewed social history from my progress note on 06/17/2017 Social History   Socioeconomic History  . Marital status: Divorced    Spouse name: Not on file  . Number of children: 2  . Years of education: Not on file  . Highest education level: Master's degree (e.g., MA, MS, MEng, MEd, MSW, MBA)  Occupational History    Comment: retired2  Tobacco Use  . Smoking status: Never Smoker  . Smokeless tobacco: Former Network engineer  . Vaping Use: Never used  Substance and Sexual Activity  . Alcohol use: No    Alcohol/week: 0.0 standard drinks  . Drug use: No  . Sexual activity: Never    Birth control/protection: Post-menopausal  Other Topics Concern  . Not on file  Social History Narrative  . Not on file   Social Determinants of Health   Financial Resource Strain: Not on file  Food  Insecurity: Not on file  Transportation Needs: Not on file  Physical Activity: Not on file  Stress: Not on file  Social Connections: Not on file    Allergies:  Allergies  Allergen Reactions  . Amoxicillin-Pot Clavulanate Nausea Only and Other (See Comments)    Diarrhea Has patient had a PCN reaction causing immediate rash, facial/tongue/throat swelling, SOB or lightheadedness with hypotension:  Has patient had a PCN reaction causing severe rash involving mucus membranes or skin necrosis: Has patient had a PCN reaction that required hospitalization:  Has patient had a PCN reaction occurring within the last 10 years: If all of  the above answers are "NO", then may proceed with Cephalosporin use.   Marland Kitchen Oxycodone-Acetaminophen Nausea Only  . Erythromycin Nausea Only  . Azithromycin Nausea And Vomiting  . Codeine   . Macrolides And Ketolides   . Oxycodone-Acetaminophen   . Penicillins Other (See Comments)    Has patient had a PCN reaction causing immediate rash, facial/tongue/throat swelling, SOB or lightheadedness with hypotension: NO Has patient had a PCN reaction causing severe rash involving mucus membranes or skin necrosis: NO Has patient had a PCN reaction that required hospitalization: NO Has patient had a PCN reaction occurring within the last 10 years: NO If all of the above answers are "NO", then may proceed with Cephalosporin use.   . Remeron [Mirtazapine]     Side effect-likely tardive dyskinesia  . Tramadol Nausea And Vomiting  . Zolpidem     Sleep walking  . Lamotrigine Rash    LAMICTAL  . Sulfa Antibiotics Rash    Metabolic Disorder Labs: Lab Results  Component Value Date   HGBA1C 5.3 11/22/2017   Lab Results  Component Value Date   PROLACTIN 33.0 (H) 11/22/2017   Lab Results  Component Value Date   CHOL 222 (H) 01/02/2020   TRIG 190 (H) 01/02/2020   HDL 69 01/02/2020   CHOLHDL 3.2 01/02/2020   VLDL 38 01/02/2020   LDLCALC 115 (H) 01/02/2020   LDLCALC  119 (H) 11/22/2017   Lab Results  Component Value Date   TSH 2.678 01/02/2020    Therapeutic Level Labs: No results found for: LITHIUM Lab Results  Component Value Date   VALPROATE 60 01/02/2020   VALPROATE 61 07/05/2019   No components found for:  CBMZ  Current Medications: Current Outpatient Medications  Medication Sig Dispense Refill  . Acetaminophen 500 MG capsule Take 500 mg by mouth. Takes 500 mg bid prn    . aspirin 81 MG chewable tablet Chew 81 mg by mouth daily with breakfast.    . Calcium Citrate-Vitamin D 200-250 MG-UNIT TABS Take 1 tablet by mouth daily after breakfast.     . carbamazepine (TEGRETOL XR) 200 MG 12 hr tablet Take 2 tablets (400 mg total) by mouth 2 (two) times daily. 360 tablet 1  . divalproex (DEPAKOTE ER) 500 MG 24 hr tablet Take 1 tablet twice a day 180 tablet 1  . fluticasone (FLONASE) 50 MCG/ACT nasal spray     . folic acid (FOLVITE) 1 MG tablet Take 2 mg by mouth daily with breakfast.     . gabapentin (NEURONTIN) 300 MG capsule Take 1 tablet twice a day 180 capsule 1  . ibandronate (BONIVA) 150 MG tablet     . levothyroxine (SYNTHROID, LEVOTHROID) 75 MCG tablet Take 75 mcg by mouth daily before breakfast.     . methotrexate 2.5 MG tablet Take 15 mg by mouth every Wednesday. WEDNESDAYS AT LUNCH    . mirabegron ER (MYRBETRIQ) 50 MG TB24 tablet Take 1 tablet (50 mg total) by mouth daily. 30 tablet 11  . Multiple Vitamins-Minerals (MULTIVITAMIN WITH MINERALS) tablet Take 1 tablet by mouth daily with breakfast.     . nitrofurantoin, macrocrystal-monohydrate, (MACROBID) 100 MG capsule Take 1 capsule (100 mg total) by mouth every 12 (twelve) hours. 8 capsule 0  . pravastatin (PRAVACHOL) 40 MG tablet Take 40 mg by mouth daily.     . QUEtiapine (SEROQUEL XR) 50 MG TB24 24 hr tablet Take 2 tablets (100 mg total) by mouth at bedtime. 60 tablet 1  . Valbenazine Tosylate [facility]) 40  MG CAPS Take 1 capsule (40 mg total) by mouth at bedtime. 30 capsule 5  .  temazepam (RESTORIL) 7.5 MG capsule Take 1 capsule (7.5 mg total) by mouth at bedtime as needed for sleep. 30 capsule 2   No current facility-administered medications for this visit.     Musculoskeletal: Strength & Muscle Tone: UTA Gait & Station: In a wheel chair Patient leans: N/A  Psychiatric Specialty Exam: Review of Systems  Constitutional: Positive for appetite change.  Psychiatric/Behavioral: Positive for dysphoric mood. The patient is nervous/anxious.   All other systems reviewed and are negative.   Blood pressure 133/87, pulse 87, temperature 97.9 F (36.6 C), temperature source Temporal, weight 104 lb 9.6 oz (47.4 kg).Body mass index is 20.43 kg/m.  General Appearance: Casual  Eye Contact:  Fair  Speech:  Clear and Coherent  Volume:  Normal  Mood:  Anxious and Dysphoric  Affect:  Congruent  Thought Process:  Goal Directed and Descriptions of Associations: Circumstantial  Orientation:  Full (Time, Place, and Person)  Thought Content: Logical   Suicidal Thoughts:  No  Homicidal Thoughts:  No  Memory:  Immediate;   Fair Recent;   Fair Remote;   Fair  Judgement:  Fair  Insight:  Fair  Psychomotor Activity:  Normal  Concentration:  Concentration: Fair and Attention Span: Fair  Recall:  [employer] of Knowledge: Fair  Language: Fair  Akathisia:  No  Handed:  Right  AIMS (if indicated): done  Assets:  Communication Skills Desire for Improvement Housing Social Support  ADL's:  Intact  Cognition: WNL  Sleep:  Fair   Screenings: [facility] Office Visit from 05/30/2020 in [facility] Admission (Discharged) from 01/02/2020 in [facility] Visit from 11/11/2017 in [facility] Total Score 0 0 0    AUDIT   Flowsheet Row Admission (Discharged) from 01/02/2020 in [facility]  Alcohol Use Disorder Identification Test Final Score (AUDIT) 0     PHQ2-9   [place] Office Visit from 05/09/2020 in [facility] Video Visit from 04/19/2020 in [facility]  PHQ-2 Total Score 3 0  PHQ-9 Total Score 6 --    [place] Visit from 05/09/2020 in [facility] Video Visit from 04/19/2020 in [facility] Admission (Discharged) from 01/02/2020 in [facility] Error: Q3, 4, or 5 should not be populated when Q2 is No No Risk       Assessment and Plan: GISSELA BLOCH is a 75 year old Caucasian female, divorced, lives in [place], has a history of bipolar disorder, multiple medical problems including recent inpatient mental health admission presented to the clinic for in person office visit.  Patient reported worsening anxiety symptoms and had called the clinic and was advised to come in for an evaluation. The patient demonstrates the following risk factors for suicide: Chronic risk factors for suicide include: psychiatric disorder of bipolar disorder and medical illness RA. Acute risk factors for suicide include: Worsening mood . Protective factors for this patient include: positive social support, positive therapeutic relationship, coping skills and hope for the future. Considering these factors, the overall suicide risk at this point appears to be low. Patient is appropriate for outpatient follow up.    Plan Bipolar disorder type I mild depressed-unstable Continue Tegretol extended release 400 mg p.o. twice daily Tegretol level-01/02/2020-8.8-therapeutic Depakote extended release 500 mg  p.o. twice daily Depakote level-01/02/2020-60-therapeutic Gabapentin 300 mg p.o. twice daily Will increase Seroquel extended release to 100 mg p.o. nightly Seroquel will help with mood, anxiety as well as her appetite.  Insomnia-stable Since Seroquel is being increased discussed with patient to  start taking temazepam 7.5 mg p.o. nightly only as needed. Advised patient to limit use since the combination can make her oversedated, cause falls.  Patient agrees with plan.  TD-chronic She is not currently on Ingrezza. Currently denies any abnormal movement problems and it was not observed in session today.  Although patient was observed only for a few minutes and she was wearing a mask.  We will continue to coordinate care with therapist [name].  Safety plan discussed with patient.  Will refer patient for intensive outpatient program, I have communicated with Ms. Dellia Nims with intensive outpatient program in [place].  Patient will be able to do virtual visits with some support with her family members and friends since she is not good with technology.  This was discussed in session.  Follow-up in clinic in 2 weeks or sooner if needed.  Patient has upcoming appointment.  This note was generated in part or whole with voice recognition software. Voice recognition is usually quite accurate but there are transcription errors that can and very often do occur. I apologize for any typographical errors that were not detected and corrected.          Ursula Alert, MD 05/31/2020, 8:25 AM

## 2020-05-31 ENCOUNTER — Telehealth (HOSPITAL_COMMUNITY): Payer: Self-pay | Admitting: Psychiatry

## 2020-05-31 ENCOUNTER — Telehealth: Payer: Self-pay

## 2020-05-31 NOTE — Telephone Encounter (Signed)
D:  Ms. Stephanie [name] had given case manager permission to speak to her daughter Stephanie [name]) yesterday.  Placed call to Ms. Stephanie [name] to discuss the referral to [facility].  Ms. Stephanie [name] is declining virtual MH-IOP at this time.  "I disagree with the referral; I feel that virtual is a less desirable way of doing therapy.  My mom needs a day program; something in person." She went on to say that everything is opening up now and the groups, along with psychiatrists/therapists should be in person now.  A: Provided Ms. Stephanie [name] with supportive feedback and mentioned that case manager will add her mother's name to a list for whenever MH-IOP opens back up in person.  Inform Dr. Shea Evans.  R:  Ms. Stephanie [name] was receptive.

## 2020-05-31 NOTE — Telephone Encounter (Signed)
Called to check on Stephanie [name] to check and see if she was doing any better than she was yesterday. she stated that she didnt take a restoril last night before going to bed but that she woke up around 1 and couldnt go back to sleep. she states she took one and it helpped to sleep until around 6.  she ate breakfast and went for a walk. she feels like the extra seroquel is ok. but she haven dry mouth.   She states her daughter is coming 38 and they are suppose to have lunch and then go to the movies.  She states that she does have a cane and a walker in case she needs it.

## 2020-05-31 NOTE — Telephone Encounter (Signed)
Thank you for letting me know. Will monitor dry mouth which can be due to several medications.

## 2020-06-05 ENCOUNTER — Telehealth (HOSPITAL_COMMUNITY): Payer: Self-pay | Admitting: Psychiatry

## 2020-06-05 NOTE — Telephone Encounter (Signed)
D:  Dr. Shea Evans had referred pt to MH-IOP.  A:  Case manager had spoken to pt's daughter last Friday and she declined program on behalf of her mother; mother (pt) called today to decline.  Pt states it wouldn't be feasible financially or being able to work the technology for her to attend MH-IOP.  "I am feeling much better and my anxiety is practically all gone."  Pt states she went to the movies with her daughter and grandkids this past Saturday and enjoyed it.  A:  Informed Dr. Shea Evans.  Also, encouraged pt to call case manager if she changes her mind in the future and would like to try the group.  R:  Pt receptive.

## 2020-06-14 ENCOUNTER — Ambulatory Visit (INDEPENDENT_AMBULATORY_CARE_PROVIDER_SITE_OTHER): Payer: Medicare PPO | Admitting: Psychiatry

## 2020-06-14 ENCOUNTER — Encounter: Payer: Self-pay | Admitting: Psychiatry

## 2020-06-14 ENCOUNTER — Other Ambulatory Visit: Payer: Self-pay

## 2020-06-14 VITALS — BP 131/86 | HR 86 | Temp 97.5°F | Wt 105.0 lb

## 2020-06-14 DIAGNOSIS — F3176 Bipolar disorder, in full remission, most recent episode depressed: Secondary | ICD-10-CM

## 2020-06-14 DIAGNOSIS — G2401 Drug induced subacute dyskinesia: Secondary | ICD-10-CM

## 2020-06-14 DIAGNOSIS — F3132 Bipolar disorder, current episode depressed, moderate: Secondary | ICD-10-CM | POA: Insufficient documentation

## 2020-06-14 DIAGNOSIS — F411 Generalized anxiety disorder: Secondary | ICD-10-CM

## 2020-06-14 DIAGNOSIS — F319 Bipolar disorder, unspecified: Secondary | ICD-10-CM | POA: Insufficient documentation

## 2020-06-14 DIAGNOSIS — F3131 Bipolar disorder, current episode depressed, mild: Secondary | ICD-10-CM | POA: Insufficient documentation

## 2020-06-14 DIAGNOSIS — F5101 Primary insomnia: Secondary | ICD-10-CM | POA: Diagnosis not present

## 2020-06-14 NOTE — Progress Notes (Signed)
[facility] MD OP Progress Note  06/14/2020 10:50 AM ESTI [name]  MRN:  [id number]  Chief Complaint:  Chief Complaint    Follow-up; Depression; Anxiety; Insomnia     HPI: Stephanie [name] is a 75 year old Caucasian female, divorced, lives in [place], has a history of bipolar disorder, GAD, primary insomnia, tardive dyskinesia, Sjogren's syndrome, Raynaud's phenomena, hypothyroidism, IBS, hyperlipidemia, fibromyalgia, GERD was evaluated in office today.  Patient presented in a wheelchair.  Patient today appeared to be pleasant, cheerful.  This is a different compared to her presentation 2 to 3 weeks ago during her last appointment.  Patient today reports her depressive symptoms as improving.  She does not feel sad as she used to before.  Her anxiety has improved a lot.  She reports she has noticed an improvement in her appetite.  She is eating better than before.  She reports she gets these meals from a church group - meal train-every Tuesday.  She reports this is going to last for 2 months.  She reports however that does help her to enjoy her meals.  She also has help from [facility] instead person who comes in to help her from 3 PM to 7 PM Mondays Wednesdays and Fridays.  She reports she takes care of her housework to some extent, changing her sheets, doing her laundry as well as drives her to places when she needs it on those days.  Patient also reports her daughter and her grandchildren spent time with her recently and they were able to get out and watch a movie.  She really enjoyed that time.  She has a lot of support system from her friends and she has started going back to church again.  Last Sunday she was able to drive to church which is an improvement for her.  She has to be very careful when she drives due to her neck problems however she reports she does not want to completely quit driving and wants to keep up with that for now.  She was able to go to [facility] and was able to adjust  her eyeglasses which has also helped.  Patient reports sleep is overall okay.  The Seroquel higher dosage has helped.  She reports there are some nights when she cannot sleep just with the Seroquel and she takes temazepam as needed.  She is aware about the drug to drug interactions including falls.  Patient denies any suicidality.  She continues to keep up with her appointments with [name].  Patient denies any side effects to medications including abnormal involuntary movements.  Patient also had AIMS done in session today and she was not observed as having any abnormal movements.  Patient denies any other concerns today.    Visit Diagnosis:    ICD-10-CM   1. Bipolar disorder, in full remission, most recent episode depressed ([place])  F31.76   2. GAD (generalized anxiety disorder)  F41.1   3. Primary insomnia  F51.01   4. Tardive dyskinesia  G24.01     Past Psychiatric History: Stephanie have reviewed past psychiatric history from my progress note on 06/17/2017.  Most recent inpatient mental health admission at [facility] 01/02/2020-01/11/2020  Past Medical History:  Past Medical History:  Diagnosis Date  . Anginal pain ([place])   . Anxiety   . Bipolar 1 disorder ([place])   . Bipolar disorder ([place])   . Coronary artery disease   . Depression   . Fatigue   . Fibromyalgia   .  GERD (gastroesophageal reflux disease)   . Heart disease   . Heart failure ([place])   . HLD (hyperlipidemia)   . HOH (hard of hearing)   . Hypothyroidism   . IBS (irritable bowel syndrome)   . Pelvic pain in female   . Perianal lesion    high grade sil lesion- keratinizing type  . RA (rheumatoid arthritis) ([place])   . Raynaud phenomenon   . Sjogren's disease ([place])   . Sleep apnea   . Vaginal Pap smear, abnormal     Past Surgical History:  Procedure Laterality Date  . CATARACT EXTRACTION W/PHACO Left 02/23/2017   Procedure: CATARACT EXTRACTION PHACO AND INTRAOCULAR LENS PLACEMENT (IOC);   Surgeon: Birder Robson, MD;  Location: [facility] ORS;  Service: Ophthalmology;  Laterality: Left;  [place] 00:35 AP% 9.6 CDE 3.47 Fluid pack lot # [id number]  . CATARACT EXTRACTION W/PHACO Right 03/30/2017   Procedure: CATARACT EXTRACTION PHACO AND INTRAOCULAR LENS PLACEMENT ([place]);  Surgeon: Birder Robson, MD;  Location: [facility] ORS;  Service: Ophthalmology;  Laterality: Right;  [place] 00:32.6 AP% 13.8 CDE 4.51 Fluid Pack Lot # [id number]  . EYE SURGERY    . TONSILLECTOMY      Family Psychiatric History: Reviewed family psychiatric history from progress note on 06/17/2017  Family History:  Family History  Problem Relation Age of Onset  . Heart failure Father   . Parkinson's disease Mother   . Bipolar disorder Mother   . Depression Sister   . Schizophrenia Paternal Grandmother   . Diabetes Maternal Grandmother   . Pancreatic cancer Maternal Grandmother   . Cancer Neg Hx   . Heart disease Neg Hx   . Breast cancer Neg Hx     Social History: Reviewed social history from progress note on 06/17/2017 Social History   Socioeconomic History  . Marital status: Divorced    Spouse name: Not on file  . Number of children: 2  . Years of education: Not on file  . Highest education level: Master's degree (e.g., MA, MS, MEng, MEd, MSW, MBA)  Occupational History    Comment: retired2  Tobacco Use  . Smoking status: Never Smoker  . Smokeless tobacco: Former Network engineer  . Vaping Use: Never used  Substance and Sexual Activity  . Alcohol use: No    Alcohol/week: 0.0 standard drinks  . Drug use: No  . Sexual activity: Never    Birth control/protection: Post-menopausal  Other Topics Concern  . Not on file  Social History Narrative  . Not on file   Social Determinants of Health   Financial Resource Strain: Not on file  Food Insecurity: Not on file  Transportation Needs: Not on file  Physical Activity: Not on file  Stress: Not on file  Social Connections: Not on file    Allergies:   Allergies  Allergen Reactions  . Amoxicillin-Pot Clavulanate Nausea Only and Other (See Comments)    Diarrhea Has patient had a PCN reaction causing immediate rash, facial/tongue/throat swelling, SOB or lightheadedness with hypotension:  Has patient had a PCN reaction causing severe rash involving mucus membranes or skin necrosis: Has patient had a PCN reaction that required hospitalization:  Has patient had a PCN reaction occurring within the last 10 years: If all of the above answers are "NO", then may proceed with Cephalosporin use.   Marland Kitchen Oxycodone-Acetaminophen Nausea Only  . Erythromycin Nausea Only  . Azithromycin Nausea And Vomiting  . Codeine   . Macrolides And Ketolides   . Oxycodone-Acetaminophen   .  Penicillins Other (See Comments)    Has patient had a PCN reaction causing immediate rash, facial/tongue/throat swelling, SOB or lightheadedness with hypotension: NO Has patient had a PCN reaction causing severe rash involving mucus membranes or skin necrosis: NO Has patient had a PCN reaction that required hospitalization: NO Has patient had a PCN reaction occurring within the last 10 years: NO If all of the above answers are "NO", then may proceed with Cephalosporin use.   . Remeron [Mirtazapine]     Side effect-likely tardive dyskinesia  . Tramadol Nausea And Vomiting  . Zolpidem     Sleep walking  . Lamotrigine Rash    LAMICTAL  . Sulfa Antibiotics Rash    Metabolic Disorder Labs: Lab Results  Component Value Date   HGBA1C 5.3 11/22/2017   Lab Results  Component Value Date   PROLACTIN 33.0 (H) 11/22/2017   Lab Results  Component Value Date   CHOL 222 (H) 01/02/2020   TRIG 190 (H) 01/02/2020   HDL 69 01/02/2020   CHOLHDL 3.2 01/02/2020   VLDL 38 01/02/2020   LDLCALC 115 (H) 01/02/2020   LDLCALC 119 (H) 11/22/2017   Lab Results  Component Value Date   TSH 2.678 01/02/2020    Therapeutic Level Labs: No results found for: LITHIUM Lab Results   Component Value Date   VALPROATE 60 01/02/2020   VALPROATE 61 07/05/2019   No components found for:  CBMZ  Current Medications: Current Outpatient Medications  Medication Sig Dispense Refill  . Acetaminophen 500 MG capsule Take 500 mg by mouth. Takes 500 mg bid prn    . aspirin 81 MG chewable tablet Chew 81 mg by mouth daily with breakfast.    . Calcium Citrate-Vitamin D 200-250 MG-UNIT TABS Take 1 tablet by mouth daily after breakfast.     . carbamazepine (TEGRETOL XR) 200 MG 12 hr tablet Take 2 tablets (400 mg total) by mouth 2 (two) times daily. 360 tablet 1  . divalproex (DEPAKOTE ER) 500 MG 24 hr tablet Take 1 tablet twice a day 180 tablet 1  . fluticasone (FLONASE) 50 MCG/ACT nasal spray     . folic acid (FOLVITE) 1 MG tablet Take 2 mg by mouth daily with breakfast.     . gabapentin (NEURONTIN) 300 MG capsule Take 1 tablet twice a day 180 capsule 1  . ibandronate (BONIVA) 150 MG tablet     . levothyroxine (SYNTHROID, LEVOTHROID) 75 MCG tablet Take 75 mcg by mouth daily before breakfast.     . methotrexate 2.5 MG tablet Take 15 mg by mouth every Wednesday. WEDNESDAYS AT LUNCH    . mirabegron ER (MYRBETRIQ) 50 MG TB24 tablet Take 1 tablet (50 mg total) by mouth daily. 30 tablet 11  . Multiple Vitamins-Minerals (MULTIVITAMIN WITH MINERALS) tablet Take 1 tablet by mouth daily with breakfast.     . nitrofurantoin, macrocrystal-monohydrate, (MACROBID) 100 MG capsule Take 1 capsule (100 mg total) by mouth every 12 (twelve) hours. 8 capsule 0  . pravastatin (PRAVACHOL) 40 MG tablet Take 40 mg by mouth daily.     . QUEtiapine (SEROQUEL XR) 50 MG TB24 24 hr tablet Take 2 tablets (100 mg total) by mouth at bedtime. 60 tablet 1  . temazepam (RESTORIL) 7.5 MG capsule Take 1 capsule (7.5 mg total) by mouth at bedtime as needed for sleep. 30 capsule 2   No current facility-administered medications for this visit.     Musculoskeletal: Strength & Muscle Tone: UTA Gait & Station:  normal Patient leans:  N/A  Psychiatric Specialty Exam: Review of Systems  Psychiatric/Behavioral: The patient is nervous/anxious (Improving).   All other systems reviewed and are negative.   Blood pressure 131/86, pulse 86, temperature (!) 97.5 F (36.4 C), temperature source Temporal, weight 105 lb (47.6 kg).Body mass index is 20.51 kg/m.  General Appearance: Casual  Eye Contact:  Fair  Speech:  Normal Rate  Volume:  Normal  Mood:  Anxious coping better  Affect:  Appropriate  Thought Process:  Goal Directed and Descriptions of Associations: Intact  Orientation:  Full (Time, Place, and Person)  Thought Content: Logical   Suicidal Thoughts:  No  Homicidal Thoughts:  No  Memory:  Immediate;   Fair Recent;   Fair Remote;   Fair  Judgement:  Fair  Insight:  Fair  Psychomotor Activity:  Normal  Concentration:  Concentration: Fair and Attention Span: Fair  Recall:  [employer] of Knowledge: Fair  Language: Fair  Akathisia:  No  Handed:  Right  AIMS (if indicated): done  Assets:  Communication Skills Desire for Improvement Housing Social Support  ADL's:  Intact  Cognition: WNL  Sleep:  Fair   Screenings: [place] Office Visit from 06/14/2020 in [place] Office Visit from 05/30/2020 in [place] Admission (Discharged) from 01/02/2020 in [place] Visit from 11/11/2017 in [place] Total Score 0 0 0 0    AUDIT   Flowsheet Row Admission (Discharged) from 01/02/2020 in [place]  Alcohol Use Disorder Identification Test Final Score (AUDIT) 0    PHQ2-9   [place] Visit from 06/14/2020 in [place] Office Visit from 05/09/2020 in [place] Video Visit from 04/19/2020 in [place]  PHQ-2 Total Score 1 3 0  PHQ-9 Total  Score 5 6 --    [place] Visit from 06/14/2020 in [place] Visit from 05/09/2020 in [place] Video Visit from 04/19/2020 in [place] Error: Q3, 4, or 5 should not be populated when Q2 is No Low Risk Error: Q3, 4, or 5 should not be populated when Q2 is No       Assessment and Plan: Stephanie [name] is a 73 year old Caucasian female, divorced, lives in [place], has a history of bipolar disorder, multiple medical problems including recent inpatient mental health admission presented to the clinic in person for follow-up.  Patient is currently making progress with regards to her mood, denies any significant sleep, anxiety symptoms as well as reports improvement in her appetite.  Patient will continue to benefit from medication management, continued social support system as well as psychotherapy sessions.  Plan as noted below.  Plan Bipolar disorder type Stephanie mild depressed-in remission Tegretol extended release 400 mg p.o. twice daily Tegretol level-01/02/2020-8.8-therapeutic Depakote extended release 500 mg p.o. twice daily Depakote level-01/02/2020-60-therapeutic Gabapentin 300 mg p.o. twice daily Seroquel extended release 100 mg p.o. nightly-increased last visit.  Insomnia-stable Patient was advised last visit to use the temazepam 7.5 mg p.o. nightly as needed since she is on a higher dosage of Seroquel. Patient has been limiting use and uses it only as needed. Provided medication education  TD-chronic Julio Alm was discontinued for side effects . AIMS -0   We will continue to coordinate care with [name]  Follow-up in clinic in in person in 6 weeks.  This  note was generated in part or whole with voice recognition software. Voice recognition is usually quite accurate but there are transcription errors that can and very often do occur. Stephanie apologize for  any typographical errors that were not detected and corrected.      Ursula Alert, MD 06/14/2020, 10:50 AM

## 2020-06-18 NOTE — Progress Notes (Signed)
07/19/19 10:36 AM   Stephanie [name] 07-07-1945 [id number]  Referring provider: Derinda Late, MD [phone] [facility] [place],  [place] [address] Chief Complaint  Patient presents with  . Over Active Bladder   Urological history: 1. OAB -contributing factors of age, vaginal atrophy and Seroquel -manage with Myrbetriq 50 mg daily  2. Urge incontinence -contributing factors of age, vaginal atrophy and Seroquel  HPI: Stephanie [name] is a 75 y.o. F who presents today for follow up.   She experiencing 1-7 daytime urinations, nocturia 1-2, mild urge to urinate, no urinary leakage, she is always wearing absorbent underwear and limits fluid consumption in the evening.  She wears depends, but she only changes them every other day.  This is because her water bill is so high.  She does not endorse that the pads are soaking when she changes them.    She has noted a rusty tinged fluid on her pads recently.    Patient denies any modifying or aggravating factors.  Patient denies any dysuria or suprapubic/flank pain.  Patient denies any fevers, chills, nausea or vomiting.     PVR 0 mL.   UA    PMH: Past Medical History:  Diagnosis Date  . Anginal pain ([place])   . Anxiety   . Bipolar 1 disorder ([place])   . Bipolar disorder ([place])   . Coronary artery disease   . Depression   . Fatigue   . Fibromyalgia   . GERD (gastroesophageal reflux disease)   . Heart disease   . Heart failure ([place])   . HLD (hyperlipidemia)   . HOH (hard of hearing)   . Hypothyroidism   . IBS (irritable bowel syndrome)   . Pelvic pain in female   . Perianal lesion    high grade sil lesion- keratinizing type  . RA (rheumatoid arthritis) ([place])   . Raynaud phenomenon   . Sjogren's disease ([place])   . Sleep apnea   . Vaginal Pap smear, abnormal     Surgical History: Past Surgical History:  Procedure Laterality Date  . CATARACT EXTRACTION W/PHACO Left 02/23/2017    Procedure: CATARACT EXTRACTION PHACO AND INTRAOCULAR LENS PLACEMENT (IOC);  Surgeon: Birder Robson, MD;  Location: [facility] ORS;  Service: Ophthalmology;  Laterality: Left;  [place] 00:35 AP% 9.6 CDE 3.47 Fluid pack lot # [id number]  . CATARACT EXTRACTION W/PHACO Right 03/30/2017   Procedure: CATARACT EXTRACTION PHACO AND INTRAOCULAR LENS PLACEMENT ([place]);  Surgeon: Birder Robson, MD;  Location: [facility] ORS;  Service: Ophthalmology;  Laterality: Right;  [place] 00:32.6 AP% 13.8 CDE 4.51 Fluid Pack Lot # [id number]  . EYE SURGERY    . TONSILLECTOMY      Home Medications:  Allergies as of 06/19/2020      Reactions   Amoxicillin-pot Clavulanate Nausea Only, Other (See Comments)   Diarrhea Has patient had a PCN reaction causing immediate rash, facial/tongue/throat swelling, SOB or lightheadedness with hypotension:  Has patient had a PCN reaction causing severe rash involving mucus membranes or skin necrosis: Has patient had a PCN reaction that required hospitalization:  Has patient had a PCN reaction occurring within the last 10 years: If all of the above answers are "NO", then may proceed with Cephalosporin use.   Oxycodone-acetaminophen Nausea Only   Erythromycin Nausea Only   Azithromycin Nausea And Vomiting   Codeine    Macrolides And Ketolides    Oxycodone-acetaminophen    Penicillins Other (See Comments)   Has patient  had a PCN reaction causing immediate rash, facial/tongue/throat swelling, SOB or lightheadedness with hypotension: NO Has patient had a PCN reaction causing severe rash involving mucus membranes or skin necrosis: NO Has patient had a PCN reaction that required hospitalization: NO Has patient had a PCN reaction occurring within the last 10 years: NO If all of the above answers are "NO", then may proceed with Cephalosporin use.   Remeron [mirtazapine]    Side effect-likely tardive dyskinesia   Tramadol Nausea And Vomiting   Zolpidem    Sleep walking   Lamotrigine Rash    LAMICTAL   Sulfa Antibiotics Rash      Medication List       Accurate as of June 19, 2020 10:36 AM. If you have any questions, ask your nurse or doctor.        Acetaminophen 500 MG capsule Take 500 mg by mouth. Takes 500 mg bid prn   aspirin 81 MG chewable tablet Chew 81 mg by mouth daily with breakfast.   Calcium Citrate-Vitamin D 200-250 MG-UNIT Tabs Take 1 tablet by mouth daily after breakfast.   carbamazepine 200 MG 12 hr tablet Commonly known as: TEGRETOL XR Take 2 tablets (400 mg total) by mouth 2 (two) times daily.   divalproex 500 MG 24 hr tablet Commonly known as: DEPAKOTE ER Take 1 tablet twice a day   fluticasone 50 MCG/ACT nasal spray Commonly known as: FLONASE   folic acid 1 MG tablet Commonly known as: FOLVITE Take 2 mg by mouth daily with breakfast.   gabapentin 300 MG capsule Commonly known as: NEURONTIN Take 1 tablet twice a day   ibandronate 150 MG tablet Commonly known as: BONIVA   levothyroxine 75 MCG tablet Commonly known as: SYNTHROID Take 75 mcg by mouth daily before breakfast.   methotrexate 2.5 MG tablet Take 15 mg by mouth every Wednesday. WEDNESDAYS AT LUNCH   mirabegron ER 50 MG Tb24 tablet Commonly known as: Myrbetriq Take 1 tablet (50 mg total) by mouth daily.   multivitamin with minerals tablet Take 1 tablet by mouth daily with breakfast.   nitrofurantoin (macrocrystal-monohydrate) 100 MG capsule Commonly known as: MACROBID Take 1 capsule (100 mg total) by mouth every 12 (twelve) hours.   pravastatin 40 MG tablet Commonly known as: PRAVACHOL Take 40 mg by mouth daily.   Premarin vaginal cream Generic drug: conjugated estrogens Place 1 Applicatorful vaginally daily. Apply 0.41m (pea-sized amount)  just inside the vaginal introitus with a finger-tip on  Monday, Wednesday and Friday nights. Started by: SZara Council PA-C   QUEtiapine 50 MG Tb24 24 hr tablet Commonly known as: SEROquel XR Take 2 tablets (100 mg  total) by mouth at bedtime.   temazepam 7.5 MG capsule Commonly known as: RESTORIL Take 1 capsule (7.5 mg total) by mouth at bedtime as needed for sleep.       Allergies:  Allergies  Allergen Reactions  . Amoxicillin-Pot Clavulanate Nausea Only and Other (See Comments)    Diarrhea Has patient had a PCN reaction causing immediate rash, facial/tongue/throat swelling, SOB or lightheadedness with hypotension:  Has patient had a PCN reaction causing severe rash involving mucus membranes or skin necrosis: Has patient had a PCN reaction that required hospitalization:  Has patient had a PCN reaction occurring within the last 10 years: If all of the above answers are "NO", then may proceed with Cephalosporin use.   .Marland KitchenOxycodone-Acetaminophen Nausea Only  . Erythromycin Nausea Only  . Azithromycin Nausea And Vomiting  . Codeine   .  Macrolides And Ketolides   . Oxycodone-Acetaminophen   . Penicillins Other (See Comments)    Has patient had a PCN reaction causing immediate rash, facial/tongue/throat swelling, SOB or lightheadedness with hypotension: NO Has patient had a PCN reaction causing severe rash involving mucus membranes or skin necrosis: NO Has patient had a PCN reaction that required hospitalization: NO Has patient had a PCN reaction occurring within the last 10 years: NO If all of the above answers are "NO", then may proceed with Cephalosporin use.   . Remeron [Mirtazapine]     Side effect-likely tardive dyskinesia  . Tramadol Nausea And Vomiting  . Zolpidem     Sleep walking  . Lamotrigine Rash    LAMICTAL  . Sulfa Antibiotics Rash    Family History: Family History  Problem Relation Age of Onset  . Heart failure Father   . Parkinson's disease Mother   . Bipolar disorder Mother   . Depression Sister   . Schizophrenia Paternal Grandmother   . Diabetes Maternal Grandmother   . Pancreatic cancer Maternal Grandmother   . Cancer Neg Hx   . Heart disease Neg Hx   .  Breast cancer Neg Hx     Social History:  reports that she has never smoked. She has quit using smokeless tobacco. She reports that she does not drink alcohol and does not use drugs.  ROS For pertinent review of systems please refer to history of present illness  Physical Exam: BP (!) 170/103   Pulse 80   Ht 5' (1.524 m)   Wt 105 lb (47.6 kg)   BMI 20.51 kg/m   Constitutional:  Well nourished. Alert and oriented, No acute distress. HEENT: [place] AT, mask in place.  Trachea midline Cardiovascular: No clubbing, cyanosis, or edema. Respiratory: Normal respiratory effort, no increased work of breathing. GU: No CVA tenderness.  No bladder fullness or masses.  Atrophic external genitalia, sparse pubic hair distribution, no lesions.  Normal urethral meatus, no lesions, no prolapse, no discharge.   Urethral caruncle noted.  No bladder fullness, tenderness or masses. Pale vagina mucosa, poor estrogen effect, no discharge, no lesions, fair pelvic support, no cystocele and no rectocele noted.  No cervical motion tenderness.  Uterus is freely mobile and non-fixed.  No adnexal/parametria masses or tenderness noted.  Anus and perineum are without rashes or lesions.   Neurologic: Grossly intact, no focal deficits, moving all 4 extremities. Psychiatric: Normal mood and affect.   Laboratory Data: WBC (White Blood Cell Count) 4.1 - 10.2 10^3/uL 4.6   RBC (Red Blood Cell Count) 4.04 - 5.48 10^6/uL 4.46   Hemoglobin 12.0 - 15.0 gm/dL 14.8   Hematocrit 35.0 - 47.0 % 46.7   MCV (Mean Corpuscular Volume) 80.0 - 100.0 fl 104.7High   MCH (Mean Corpuscular Hemoglobin) 27.0 - 31.2 pg 33.2High   MCHC (Mean Corpuscular Hemoglobin Concentration) 32.0 - 36.0 gm/dL 31.7Low   Platelet Count 150 - 450 10^3/uL 129Low   RDW-CV (Red Cell Distribution Width) 11.6 - 14.8 % 12.9   MPV (Mean Platelet Volume) 9.4 - 12.4 fl 11.1   Neutrophils 1.50 - 7.80 10^3/uL 2.00   Lymphocytes 1.00 - 3.60 10^3/uL 2.10   Mixed Count  0.10 - 0.90 10^3/uL 0.50   Neutrophil % 32.0 - 70.0 % 45.0   Lymphocyte % 10.0 - 50.0 % 45.2   Mixed % 3.0 - 14.4 % 9.8   Resulting Agency  [facility] - [facility]  Specimen Collected: 04/09/20 08:08 Last Resulted: 04/09/20 11:24  Received From: [facility]  Result Received: 06/05/20 09:46   Thyroid Stimulating Hormone (TSH) 0.450-5.330 uIU/ml uIU/mL 1.310   Comment: Reference Range for Pregnant Females >= 24 yrs old:  Normal Range for 1st trimester: 0.05-3.70 ulU/ml  Normal Range for 2nd trimester: 0.31-4.35 ulU/ml  Resulting Agency  [facility] - [facility]  Specimen Collected: 04/09/20 08:08 Last Resulted: 04/09/20 12:45  Received From: [facility]  Result Received: 06/05/20 09:46   Hemoglobin A1C 4.2 - 5.6 % 5.7High   Average Blood Glucose (Calc) mg/dL 117   Resulting Agency  [facility] - [facility]   Narrative Performed by [facility] - [facility] Normal Range:  4.2 - 5.6%  Increased Risk: 5.7 - 6.4%  Diabetes:    >= 6.5%  Glycemic Control for adults with diabetes: <7%  Specimen Collected: 04/09/20 08:08 Last Resulted: 04/09/20 13:24  Received From: [facility]  Result Received: 06/05/20 09:46   Ref Range & Units 2 mo ago   Glucose 70 - 110 mg/dL 88   Sodium 136 - 145 mmol/L 140   Potassium 3.6 - 5.1 mmol/L 4.1   Chloride 97 - 109 mmol/L 102   Carbon Dioxide (CO2) 22.0 - 32.0 mmol/L 32.7High   Urea Nitrogen (BUN) 7 - 25 mg/dL 19   Creatinine 0.6 - 1.1 mg/dL 0.6   Glomerular Filtration Rate (eGFR), MDRD Estimate >60 mL/min/1.73sq m 98   Calcium 8.7 - 10.3 mg/dL 9.5   AST  8 - 39 U/L 23   ALT  5 - 38 U/L 14   Alk Phos (alkaline Phosphatase) 34 - 104 U/L 55   Albumin 3.5 - 4.8 g/dL 4.4   Bilirubin, Total 0.3 - 1.2 mg/dL 0.4   Protein, Total 6.1 - 7.9 g/dL 6.7   A/G Ratio 1.0 - 5.0 gm/dL 1.9   Resulting Agency  [facility] - [facility]  Specimen Collected: 04/09/20 08:08 Last Resulted:  04/09/20 12:58  Received From: [facility]  Result Received: 06/05/20 09:46  I have reviewed the labs.  Pertinent imaging Results for ALYDA, MEGNA (MRN [id number]) as of 06/19/2020 10:05  Ref. Range 06/19/2020 09:32  Scan Result Unknown 69m     Assessment & Plan:    1. Overactive bladder / urinary frequency -continue Myrbetriq 50 mg daily   2.  Urge incontinence See above   3. Vaginal atrophy/Urethral caruncle -We will start vaginal estrogen cream at this time, she will apply a blueberry sized to her vaginal area nightly for 2 weeks  4. Gross hematuria -Today's UA is negative for microscopic hematuria and she denies blood in her urinary stream -The blood on her pad may be result of irritation of the urethral caruncle so we will reassess after 2 weeks applying the vaginal cream -I also explained to her that if she should experience blood in the urine stream, or we see microscopic blood on UA or she continues to see the resting discharge I recommend she undergo CT urogram and cystoscopy for further evaluation in order to rule malignancy  5. Abnormal Pap Smear  -former patient of Dr. DEnzo Bifor an abnormal Pap Smear -It appears that she had a vulvar colposcopy x2 and was to follow-up as needed, but due to her recent episodes of rust colored discharge in her pad she would like a referral back to gynecology   [id number] [place] [phone] SZara Council PA-C

## 2020-06-19 ENCOUNTER — Ambulatory Visit: Payer: Medicare PPO | Admitting: Urology

## 2020-06-19 ENCOUNTER — Other Ambulatory Visit: Payer: Self-pay

## 2020-06-19 ENCOUNTER — Encounter: Payer: Self-pay | Admitting: Urology

## 2020-06-19 VITALS — BP 170/103 | HR 80 | Ht 60.0 in | Wt 105.0 lb

## 2020-06-19 DIAGNOSIS — N3941 Urge incontinence: Secondary | ICD-10-CM

## 2020-06-19 DIAGNOSIS — N898 Other specified noninflammatory disorders of vagina: Secondary | ICD-10-CM | POA: Diagnosis not present

## 2020-06-19 DIAGNOSIS — N3281 Overactive bladder: Secondary | ICD-10-CM

## 2020-06-19 LAB — BLADDER SCAN AMB NON-IMAGING

## 2020-06-19 MED ORDER — PREMARIN 0.625 MG/GM VA CREA: 1.0000 | TOPICAL_CREAM | Freq: Every day | VAGINAL | 0 refills | Status: DC

## 2020-06-19 NOTE — Patient Instructions (Signed)
Apply blueberry sized amount of Premarin to your external vaginal area daily for the next 2 weeks.

## 2020-06-20 LAB — MICROSCOPIC EXAMINATION
Bacteria, UA: NONE SEEN
RBC, Urine: NONE SEEN /hpf (ref 0–2)
WBC, UA: NONE SEEN /hpf (ref 0–5)

## 2020-06-20 LAB — URINALYSIS, COMPLETE
Bilirubin, UA: NEGATIVE
Glucose, UA: NEGATIVE
Ketones, UA: NEGATIVE
Leukocytes,UA: NEGATIVE
Nitrite, UA: NEGATIVE
Protein,UA: NEGATIVE
RBC, UA: NEGATIVE
Specific Gravity, UA: 1.015 (ref 1.005–1.030)
Urobilinogen, Ur: 0.2 mg/dL (ref 0.2–1.0)
pH, UA: 8.5 — ABNORMAL HIGH (ref 5.0–7.5)

## 2020-06-26 ENCOUNTER — Telehealth: Payer: Self-pay

## 2020-06-26 NOTE — Telephone Encounter (Signed)
pt she is manic and states that she needs a medications agustment. she not able to drive and she doesnt have a way. she like to speak with you about maybe  increase her medication. she states she also having issues with her bladder.

## 2020-06-26 NOTE — Telephone Encounter (Signed)
Returned call to patient.  She reports feeling on edge the past few days.  Denies any other manic symptoms.  Reports she had psychosocial stressors of problems with billing with the home health support she is getting right now.  That has been resolved and she feels better now.  Discussed with patient that since she had a stressor and her anxiety most likely was triggered by it and she currently does not have any other manic symptoms I would recommend giving it more time since her situational stressor has been resolved.  In the meantime she could monitor herself.  She could talk to her therapist [name].  She could continue current medications.  If she notices any worsening she could get in touch with Probation officer.  Patient agrees with plan.

## 2020-06-27 ENCOUNTER — Other Ambulatory Visit: Payer: Self-pay

## 2020-06-27 ENCOUNTER — Ambulatory Visit: Payer: Medicare PPO | Admitting: Obstetrics and Gynecology

## 2020-06-27 ENCOUNTER — Encounter: Payer: Self-pay | Admitting: Obstetrics and Gynecology

## 2020-06-27 VITALS — BP 138/78 | HR 73 | Ht 60.0 in | Wt 105.9 lb

## 2020-06-27 DIAGNOSIS — N950 Postmenopausal bleeding: Secondary | ICD-10-CM

## 2020-06-27 NOTE — Patient Instructions (Signed)
Postmenopausal Bleeding Postmenopausal bleeding is any bleeding that occurs after menopause. Menopause is a time in a woman's life when monthly periods stop. Any type of bleeding after menopause should be checked by your doctor. Treatment will depend on the cause. This kind of bleeding can be caused by:  Taking hormones during menopause.  Low or high amounts of female hormones in the body. This can cause the lining of the womb (uterus) to become too thin or too thick.  Cancer.  Growths in the womb that are not cancer. Follow these instructions at home:  Watch for any changes in your symptoms. Let your doctor know about them.  Avoid using tampons and douches as told by your doctor.  Change your pads regularly.  Get regular pelvic exams. This includes Pap tests.  Take iron pills as told by your doctor.  Take over-the-counter and prescription medicines only as told by your doctor.  Keep all follow-up visits.   Contact a doctor if:  You have new bleeding from the vagina after menopause.  You have pain in your belly (abdomen). Get help right away if:  You have a fever or chills.  You have very bad pain with bleeding.  You have clumps of blood (blood clots) coming from your vagina.  You have a lot of bleeding, and: ? You use more than 1 pad an hour. ? This kind of bleeding has never happened before.  You have headaches.  You feel dizzy or you feel like you are going to pass out (faint). Summary  Any type of bleeding after menopause should be checked by your doctor.  Avoid using tampons or douches.  Get regular pelvic exams. This includes Pap tests.  Contact a doctor if you have new bleeding or pain in your belly.  Watch for any changes in your symptoms. Let your doctor know about them. This information is not intended to replace advice given to you by your health care provider. Make sure you discuss any questions you have with your health care provider. Document  Revised: 08/10/2019 Document Reviewed: 08/10/2019 Elsevier Patient Education  [place].

## 2020-06-27 NOTE — Progress Notes (Signed)
GYNECOLOGY PROGRESS NOTE  Subjective:    Patient ID: Stephanie [name], female    DOB: 1945/12/01, 75 y.o.   MRN: [id number]  HPI  Patient is a 75 y.o. [id number] post-menopausal female who presents as a referral from Urology due to "rust" colored vaginal discharge, concerns for postmenopausal bleeding. She was initially being seen for management of her overactive bladder and urge incontinence, appointment was 06/19/20. Currently weiaring absorbent underwear.  Patient reports the discharge has been ongoing for ~ 6-8 weeks. Denies pelvic pain (except after having recent exam performed, notes some cramping for several days after).  Is not currently on an HRT, but does use local estrogen therapy for her vaginal atrophy.  Notes she is almost out of her prescription. Denies any issues with bowel movements or rectal bleeding. Has had history of normal colon screens.    Past Medical History:  Diagnosis Date  . Anginal pain ([place])   . Anxiety   . Bipolar 1 disorder ([place])   . Bipolar disorder ([place])   . Coronary artery disease   . Depression   . Fatigue   . Fibromyalgia   . GERD (gastroesophageal reflux disease)   . Heart disease   . Heart failure ([place])   . HLD (hyperlipidemia)   . HOH (hard of hearing)   . Hypothyroidism   . IBS (irritable bowel syndrome)   . Pelvic pain in female   . Perianal lesion    high grade sil lesion- keratinizing type  . RA (rheumatoid arthritis) ([place])   . Raynaud phenomenon   . Sjogren's disease ([place])   . Sleep apnea   . Vaginal Pap smear, abnormal     Family History  Problem Relation Age of Onset  . Heart failure Father   . Parkinson's disease Mother   . Bipolar disorder Mother   . Depression Sister   . Schizophrenia Paternal Grandmother   . Diabetes Maternal Grandmother   . Pancreatic cancer Maternal Grandmother   . Cancer Neg Hx   . Heart disease Neg Hx   . Breast cancer Neg Hx     Past Surgical History:  Procedure Laterality Date  . CATARACT  EXTRACTION W/PHACO Left 02/23/2017   Procedure: CATARACT EXTRACTION PHACO AND INTRAOCULAR LENS PLACEMENT (IOC);  Surgeon: Birder Robson, MD;  Location: [facility] ORS;  Service: Ophthalmology;  Laterality: Left;  [place] 00:35 AP% 9.6 CDE 3.47 Fluid pack lot # [id number]  . CATARACT EXTRACTION W/PHACO Right 03/30/2017   Procedure: CATARACT EXTRACTION PHACO AND INTRAOCULAR LENS PLACEMENT ([place]);  Surgeon: Birder Robson, MD;  Location: [facility] ORS;  Service: Ophthalmology;  Laterality: Right;  [place] 00:32.6 AP% 13.8 CDE 4.51 Fluid Pack Lot # [id number]  . EYE SURGERY    . TONSILLECTOMY      Social History   Socioeconomic History  . Marital status: Divorced    Spouse name: Not on file  . Number of children: 2  . Years of education: Not on file  . Highest education level: Master's degree (e.g., MA, MS, MEng, MEd, MSW, MBA)  Occupational History    Comment: retired2  Tobacco Use  . Smoking status: Never Smoker  . Smokeless tobacco: Former Network engineer  . Vaping Use: Never used  Substance and Sexual Activity  . Alcohol use: No    Alcohol/week: 0.0 standard drinks  . Drug use: No  . Sexual activity: Never    Birth control/protection: Post-menopausal  Other Topics Concern  . Not on file  Social History Narrative  .  Not on file   Social Determinants of Health   Financial Resource Strain: Not on file  Food Insecurity: Not on file  Transportation Needs: Not on file  Physical Activity: Not on file  Stress: Not on file  Social Connections: Not on file  Intimate Partner Violence: Not on file    Current Outpatient Medications on File Prior to Visit  Medication Sig Dispense Refill  . Acetaminophen 500 MG capsule Take 500 mg by mouth. Takes 500 mg bid prn    . aspirin 81 MG chewable tablet Chew 81 mg by mouth daily with breakfast.    . Calcium Citrate-Vitamin D 200-250 MG-UNIT TABS Take 1 tablet by mouth daily after breakfast.     . carbamazepine (TEGRETOL XR) 200 MG 12 hr tablet Take 2  tablets (400 mg total) by mouth 2 (two) times daily. 360 tablet 1  . conjugated estrogens (PREMARIN) vaginal cream Place 1 Applicatorful vaginally daily. Apply 0.5mg  (pea-sized amount)  just inside the vaginal introitus with a finger-tip on  Monday, Wednesday and Friday nights. 30 g 0  . divalproex (DEPAKOTE ER) 500 MG 24 hr tablet Take 1 tablet twice a day 180 tablet 1  . fluticasone (FLONASE) 50 MCG/ACT nasal spray     . folic acid (FOLVITE) 1 MG tablet Take 2 mg by mouth daily with breakfast.     . gabapentin (NEURONTIN) 300 MG capsule Take 1 tablet twice a day 180 capsule 1  . ibandronate (BONIVA) 150 MG tablet every 30 (thirty) days.    Marland Kitchen levothyroxine (SYNTHROID, LEVOTHROID) 75 MCG tablet Take 75 mcg by mouth daily before breakfast.     . methotrexate 2.5 MG tablet Take 15 mg by mouth every Wednesday. WEDNESDAYS AT LUNCH    . mirabegron ER (MYRBETRIQ) 50 MG TB24 tablet Take 1 tablet (50 mg total) by mouth daily. 30 tablet 11  . Multiple Vitamins-Minerals (MULTIVITAMIN WITH MINERALS) tablet Take 1 tablet by mouth daily with breakfast.     . pravastatin (PRAVACHOL) 40 MG tablet Take 40 mg by mouth daily.     . QUEtiapine (SEROQUEL XR) 50 MG TB24 24 hr tablet Take 2 tablets (100 mg total) by mouth at bedtime. (Patient taking differently: Take 100 mg by mouth at bedtime. As needed) 60 tablet 1  . temazepam (RESTORIL) 7.5 MG capsule Take 1 capsule (7.5 mg total) by mouth at bedtime as needed for sleep. 30 capsule 2   No current facility-administered medications on file prior to visit.    Allergies  Allergen Reactions  . Amoxicillin-Pot Clavulanate Nausea Only and Other (See Comments)    Diarrhea Has patient had a PCN reaction causing immediate rash, facial/tongue/throat swelling, SOB or lightheadedness with hypotension:  Has patient had a PCN reaction causing severe rash involving mucus membranes or skin necrosis: Has patient had a PCN reaction that required hospitalization:  Has patient  had a PCN reaction occurring within the last 10 years: If all of the above answers are "NO", then may proceed with Cephalosporin use.   Marland Kitchen Oxycodone-Acetaminophen Nausea Only  . Erythromycin Nausea Only  . Azithromycin Nausea And Vomiting  . Codeine   . Macrolides And Ketolides   . Oxycodone-Acetaminophen   . Penicillins Other (See Comments)    Has patient had a PCN reaction causing immediate rash, facial/tongue/throat swelling, SOB or lightheadedness with hypotension: NO Has patient had a PCN reaction causing severe rash involving mucus membranes or skin necrosis: NO Has patient had a PCN reaction that required hospitalization: NO Has  patient had a PCN reaction occurring within the last 10 years: NO If all of the above answers are "NO", then may proceed with Cephalosporin use.   . Remeron [Mirtazapine]     Side effect-likely tardive dyskinesia  . Tramadol Nausea And Vomiting  . Zolpidem     Sleep walking  . Lamotrigine Rash    LAMICTAL  . Sulfa Antibiotics Rash     Review of Systems Pertinent items noted in HPI and remainder of comprehensive ROS otherwise negative.   Objective:   Blood pressure 138/78, pulse 73, height 5' (1.524 m), weight 105 lb 14.4 oz (48 kg). General appearance: alert and no distress Abdomen: soft, non-tender; bowel sounds normal; no masses,  no organomegaly Pelvic: external genitalia normal, rectovaginal septum normal.  Vagina without discharge.  Cervix normal appearing, no lesions and no motion tenderness.  Uterus mobile, nontender, normal shape and size.  Adnexae non-palpable, nontender bilaterally.  Extremities: extremities normal, atraumatic, no cyanosis or edema Neurologic: Grossly normal   Labs:  Urinalysis    Component Value Date/Time   COLORURINE YELLOW (A) 01/07/2020 1618   APPEARANCEUR Clear 06/19/2020 1004   LABSPEC 1.009 01/07/2020 1618   PHURINE 7.0 01/07/2020 1618   GLUCOSEU Negative 06/19/2020 1004   HGBUR MODERATE (A) 01/07/2020  1618   BILIRUBINUR Negative 06/19/2020 1004   [place] 01/07/2020 1618   PROTEINUR Negative 06/19/2020 1004   PROTEINUR 30 (A) 01/07/2020 1618   NITRITE Negative 06/19/2020 1004   NITRITE NEGATIVE 01/07/2020 1618   LEUKOCYTESUR Negative 06/19/2020 1004   LEUKOCYTESUR MODERATE (A) 01/07/2020 1618      Assessment:   Postmenopausal bleeding Vaginal atrophy  Plan:   - No evidence of bleeding noted on exam today, however patient is fairly certain it is of vaginal origin. Was seen last week by Urology, no evidence of UTI. Blood noted in urine but again may have been vaginal source. Will order pelvic ultrasound to rule out other causes of PMB.  - Currently using premarin cream for vaginal atrophy. Desires refill. Will send prescription.  - Will notify patient of results after ultrasound and arrange for follow up accordingly.    Rubie Maid, MD [facility]

## 2020-06-27 NOTE — Progress Notes (Signed)
New GYN-Pt present for vaginal discharge. Pt stated that she noticed the vaginal discharge x 6 weeks.

## 2020-06-28 ENCOUNTER — Telehealth: Payer: Self-pay

## 2020-06-28 DIAGNOSIS — N898 Other specified noninflammatory disorders of vagina: Secondary | ICD-10-CM

## 2020-06-28 MED ORDER — PREMARIN 0.625 MG/GM VA CREA: TOPICAL_CREAM | VAGINAL | 0 refills | Status: DC

## 2020-06-28 NOTE — Telephone Encounter (Signed)
Incoming call from pt on triage line who states that she needs a refill on Premarin cream as she is out of samples. Per GYN note ok to continue. RX sent in.

## 2020-07-01 ENCOUNTER — Telehealth: Payer: Self-pay

## 2020-07-01 DIAGNOSIS — F3174 Bipolar disorder, in full remission, most recent episode manic: Secondary | ICD-10-CM

## 2020-07-01 NOTE — Telephone Encounter (Signed)
Reports she may be manic. Since she has SAD , and season just changed and she is impulsive,confrontational with friends has sleep issues. But sleep OK when she takes temazepam.   Patient reports her friend felt she wanted to play Scrabble when she did not want to and hence she told her that she did not want to do it and that is what she meant by being confrontational with her friend recently.  Patient has upcoming appointment with therapist.  Advised patient to talk to her therapist and if therapist after the visit has any concerns about her being manic could reach out to [facility].  Otherwise within the next few days if her symptoms are getting worse advised patient to come into the office for in person evaluation.  Patient advised to continue current medications.

## 2020-07-01 NOTE — Telephone Encounter (Signed)
pt called left message that she is manic and she is not sleeping she wanted to know if you would  increase her medication. maybe the restoril and seroquel.  she states she also needs a refill on the restoril.

## 2020-07-03 ENCOUNTER — Telehealth: Payer: Self-pay

## 2020-07-03 ENCOUNTER — Telehealth: Payer: Self-pay | Admitting: Psychiatry

## 2020-07-03 DIAGNOSIS — F3110 Bipolar disorder, current episode manic without psychotic features, unspecified: Secondary | ICD-10-CM

## 2020-07-03 MED ORDER — QUETIAPINE FUMARATE ER 150 MG PO TB24: 150.0000 mg | ORAL_TABLET | Freq: Every day | ORAL | 0 refills | Status: DC

## 2020-07-03 NOTE — Telephone Encounter (Signed)
pt states her symptoms is worse for her mania . she is can not sleep she gets up at 2:15 and can not go back to sleep.

## 2020-07-03 NOTE — Telephone Encounter (Signed)
Called pt and advised about increase in seroquel. Pt was made an appt for may 11th which she stated that she didn't drive but she will see if she can get someone to bring her. She also stated that she will not be able to start the seroquel until tomorrow because it has to be delivered to her.

## 2020-07-03 NOTE — Telephone Encounter (Signed)
Please let patient know I am increasing her seroquel to 150 mg and sending to pharmacy . Pls ask her to make a sooner visit with me

## 2020-07-03 NOTE — Progress Notes (Signed)
07/19/19 10:41 AM   Stephanie [name] 20-Nov-1945 [id number]  Referring provider: Derinda Late, MD [phone] [facility] [place],  [place] [address] Chief Complaint  Patient presents with  . Over Active Bladder   Urological history: 1. OAB -contributing factors of age, vaginal atrophy and Seroquel -manage with Myrbetriq 50 mg daily  2. Urge incontinence -contributing factors of age, vaginal atrophy and Seroquel  3. Urethral caruncle  HPI: Stephanie [name] is a 75 y.o. F who presents today for follow up with her friend, [name].   She has been applying the vaginal estrogen cream as prescribed daily.  She is also seen Dr. Marcelline Mates in the interim and there is a pelvic ultrasound ordered.    She states that it is quite cumbersome to apply the vaginal cream daily and that it is also cost prohibitive.  PMH: Past Medical History:  Diagnosis Date  . Anginal pain ([place])   . Anxiety   . Bipolar 1 disorder ([place])   . Bipolar disorder ([place])   . Coronary artery disease   . Depression   . Fatigue   . Fibromyalgia   . GERD (gastroesophageal reflux disease)   . Heart disease   . Heart failure ([place])   . HLD (hyperlipidemia)   . HOH (hard of hearing)   . Hypothyroidism   . IBS (irritable bowel syndrome)   . Pelvic pain in female   . Perianal lesion    high grade sil lesion- keratinizing type  . RA (rheumatoid arthritis) ([place])   . Raynaud phenomenon   . Sjogren's disease ([place])   . Sleep apnea   . Vaginal Pap smear, abnormal     Surgical History: Past Surgical History:  Procedure Laterality Date  . CATARACT EXTRACTION W/PHACO Left 02/23/2017   Procedure: CATARACT EXTRACTION PHACO AND INTRAOCULAR LENS PLACEMENT (IOC);  Surgeon: Birder Robson, MD;  Location: [facility] ORS;  Service: Ophthalmology;  Laterality: Left;  [place] 00:35 AP% 9.6 CDE 3.47 Fluid pack lot # [id number]  . CATARACT EXTRACTION W/PHACO Right 03/30/2017   Procedure:  CATARACT EXTRACTION PHACO AND INTRAOCULAR LENS PLACEMENT ([place]);  Surgeon: Birder Robson, MD;  Location: [facility] ORS;  Service: Ophthalmology;  Laterality: Right;  [place] 00:32.6 AP% 13.8 CDE 4.51 Fluid Pack Lot # [id number]  . EYE SURGERY    . TONSILLECTOMY      Home Medications:  Allergies as of 07/04/2020      Reactions   Amoxicillin-pot Clavulanate Nausea Only, Other (See Comments)   Diarrhea Has patient had a PCN reaction causing immediate rash, facial/tongue/throat swelling, SOB or lightheadedness with hypotension:  Has patient had a PCN reaction causing severe rash involving mucus membranes or skin necrosis: Has patient had a PCN reaction that required hospitalization:  Has patient had a PCN reaction occurring within the last 10 years: If all of the above answers are "NO", then may proceed with Cephalosporin use.   Oxycodone-acetaminophen Nausea Only   Erythromycin Nausea Only   Azithromycin Nausea And Vomiting   Codeine    Macrolides And Ketolides    Oxycodone-acetaminophen    Penicillins Other (See Comments)   Has patient had a PCN reaction causing immediate rash, facial/tongue/throat swelling, SOB or lightheadedness with hypotension: NO Has patient had a PCN reaction causing severe rash involving mucus membranes or skin necrosis: NO Has patient had a PCN reaction that required hospitalization: NO Has patient had a PCN reaction occurring within the last 10 years: NO If all  of the above answers are "NO", then may proceed with Cephalosporin use.   Remeron [mirtazapine]    Side effect-likely tardive dyskinesia   Tramadol Nausea And Vomiting   Zolpidem    Sleep walking   Lamotrigine Rash   LAMICTAL   Sulfa Antibiotics Rash      Medication List       Accurate as of July 04, 2020 10:41 AM. If you have any questions, ask your nurse or doctor.        Acetaminophen 500 MG capsule Take 500 mg by mouth. Takes 500 mg bid prn   aspirin 81 MG chewable tablet Chew 81 mg by  mouth daily with breakfast.   Calcium Citrate-Vitamin D 200-250 MG-UNIT Tabs Take 1 tablet by mouth daily after breakfast.   carbamazepine 200 MG 12 hr tablet Commonly known as: TEGRETOL XR Take 2 tablets (400 mg total) by mouth 2 (two) times daily.   divalproex 500 MG 24 hr tablet Commonly known as: DEPAKOTE ER Take 1 tablet twice a day   fluticasone 50 MCG/ACT nasal spray Commonly known as: FLONASE   folic acid 1 MG tablet Commonly known as: FOLVITE Take 2 mg by mouth daily with breakfast.   gabapentin 300 MG capsule Commonly known as: NEURONTIN Take 1 tablet twice a day   ibandronate 150 MG tablet Commonly known as: BONIVA every 30 (thirty) days.   levothyroxine 75 MCG tablet Commonly known as: SYNTHROID Take 75 mcg by mouth daily before breakfast.   methotrexate 2.5 MG tablet Take 15 mg by mouth every Wednesday. WEDNESDAYS AT LUNCH   mirabegron ER 50 MG Tb24 tablet Commonly known as: Myrbetriq Take 1 tablet (50 mg total) by mouth daily.   multivitamin with minerals tablet Take 1 tablet by mouth daily with breakfast.   pravastatin 40 MG tablet Commonly known as: PRAVACHOL Take 40 mg by mouth daily.   Premarin vaginal cream Generic drug: conjugated estrogens Estrogen Cream Instruction Discard applicator Apply pea sized amount to tip of finger to urethra before bed. Wash hands well after application. Use Monday, Wednesday and Friday   QUEtiapine Fumarate 150 MG 24 hr tablet Commonly known as: SEROquel XR Take 1 tablet (150 mg total) by mouth at bedtime.   temazepam 7.5 MG capsule Commonly known as: RESTORIL Take 1 capsule (7.5 mg total) by mouth at bedtime as needed for sleep.       Allergies:  Allergies  Allergen Reactions  . Amoxicillin-Pot Clavulanate Nausea Only and Other (See Comments)    Diarrhea Has patient had a PCN reaction causing immediate rash, facial/tongue/throat swelling, SOB or lightheadedness with hypotension:  Has patient had a PCN  reaction causing severe rash involving mucus membranes or skin necrosis: Has patient had a PCN reaction that required hospitalization:  Has patient had a PCN reaction occurring within the last 10 years: If all of the above answers are "NO", then may proceed with Cephalosporin use.   Marland Kitchen Oxycodone-Acetaminophen Nausea Only  . Erythromycin Nausea Only  . Azithromycin Nausea And Vomiting  . Codeine   . Macrolides And Ketolides   . Oxycodone-Acetaminophen   . Penicillins Other (See Comments)    Has patient had a PCN reaction causing immediate rash, facial/tongue/throat swelling, SOB or lightheadedness with hypotension: NO Has patient had a PCN reaction causing severe rash involving mucus membranes or skin necrosis: NO Has patient had a PCN reaction that required hospitalization: NO Has patient had a PCN reaction occurring within the last 10 years: NO If all of  the above answers are "NO", then may proceed with Cephalosporin use.   . Remeron [Mirtazapine]     Side effect-likely tardive dyskinesia  . Tramadol Nausea And Vomiting  . Zolpidem     Sleep walking  . Lamotrigine Rash    LAMICTAL  . Sulfa Antibiotics Rash    Family History: Family History  Problem Relation Age of Onset  . Heart failure Father   . Parkinson's disease Mother   . Bipolar disorder Mother   . Depression Sister   . Schizophrenia Paternal Grandmother   . Diabetes Maternal Grandmother   . Pancreatic cancer Maternal Grandmother   . Cancer Neg Hx   . Heart disease Neg Hx   . Breast cancer Neg Hx     Social History:  reports that she has never smoked. She has quit using smokeless tobacco. She reports that she does not drink alcohol and does not use drugs.  ROS For pertinent review of systems please refer to history of present illness  Physical Exam: BP 138/90   Pulse 74   Ht 5' (1.524 m)   Wt 106 lb (48.1 kg)   BMI 20.70 kg/m   Constitutional:  Well nourished. Alert and oriented, No acute  distress. HEENT: [place] AT, mask in place.  Trachea midline Cardiovascular: No clubbing, cyanosis, or edema. Respiratory: Normal respiratory effort, no increased work of breathing. GU: No CVA tenderness.  No bladder fullness or masses.  Atrophic external genitalia, normal pubic hair distribution, no lesions.  Normal urethral meatus, no lesions, no prolapse, no discharge.   No urethral masses, tenderness and/or tenderness.  Urethral caruncle has involuted.   Neurologic: Grossly intact, no focal deficits, moving all 4 extremities. Psychiatric: Normal mood and affect.   Laboratory Data: N/A Pertinent imaging N/A    Assessment & Plan:    1. Overactive bladder / urinary frequency -continue Myrbetriq 50 mg daily   2.  Urge incontinence See above   3. Vaginal atrophy/Urethral caruncle -Continue vaginal estrogen cream, but only need to apply it Monday, Wednesday and Friday nights -Prescription sent for Estrace cream to [facility] using good Rx prescription in hopes that will be more cost efficient for her  4. Gross hematuria -resolved   5. Abnormal Pap Smear  -has seen Dr. [facility] [address], [place], [place] [address] [phone]  Zara Council, PA-C

## 2020-07-03 NOTE — Telephone Encounter (Signed)
Received message from Stephanie [name] that Stephanie [name] needs help with her sleep.  I did speak to Stephanie [name] a couple of days ago and we had discussed increasing the medication if she continues to struggle.  Will increase Seroquel to 150 mg at bedtime.  I have sent it to pharmacy.  Stephanie [name] is aware about medication side effects including fall risk since these were discussed in sessions multiple times in the past.  We will have Janett Billow CMA called Stephanie [name] and let her know.

## 2020-07-04 ENCOUNTER — Ambulatory Visit: Payer: Medicare PPO | Admitting: Urology

## 2020-07-04 ENCOUNTER — Encounter: Payer: Self-pay | Admitting: Urology

## 2020-07-04 ENCOUNTER — Other Ambulatory Visit: Payer: Self-pay

## 2020-07-04 VITALS — BP 138/90 | HR 74 | Ht 60.0 in | Wt 106.0 lb

## 2020-07-04 DIAGNOSIS — N362 Urethral caruncle: Secondary | ICD-10-CM

## 2020-07-04 MED ORDER — ESTRADIOL 0.1 MG/GM VA CREA: TOPICAL_CREAM | VAGINAL | 12 refills | Status: DC

## 2020-07-04 NOTE — Patient Instructions (Signed)
  You are given a sample of vaginal estrogen cream Premarin and instructed to apply 0.5mg (pea-sized amount)  just inside the vaginal introitus with a finger-tip on Monday, Wednesday and Friday nights,     

## 2020-07-16 ENCOUNTER — Telehealth: Payer: Self-pay

## 2020-07-16 NOTE — Telephone Encounter (Signed)
Medication management - Telephone call back with pt after she left one to verify the time of her in-office appointment for 07/17/20.  Patient reported she would be coming in with [facility] transportation for the 10:00 am appointment.

## 2020-07-17 ENCOUNTER — Ambulatory Visit (INDEPENDENT_AMBULATORY_CARE_PROVIDER_SITE_OTHER): Payer: Medicare PPO | Admitting: Psychiatry

## 2020-07-17 ENCOUNTER — Encounter: Payer: Self-pay | Admitting: Psychiatry

## 2020-07-17 ENCOUNTER — Other Ambulatory Visit: Payer: Self-pay

## 2020-07-17 VITALS — BP 156/89 | HR 74 | Wt 106.0 lb

## 2020-07-17 DIAGNOSIS — F3110 Bipolar disorder, current episode manic without psychotic features, unspecified: Secondary | ICD-10-CM | POA: Diagnosis not present

## 2020-07-17 DIAGNOSIS — F411 Generalized anxiety disorder: Secondary | ICD-10-CM | POA: Diagnosis not present

## 2020-07-17 DIAGNOSIS — G2401 Drug induced subacute dyskinesia: Secondary | ICD-10-CM

## 2020-07-17 DIAGNOSIS — F5101 Primary insomnia: Secondary | ICD-10-CM

## 2020-07-17 DIAGNOSIS — Z79899 Other long term (current) drug therapy: Secondary | ICD-10-CM

## 2020-07-17 MED ORDER — QUETIAPINE FUMARATE ER 150 MG PO TB24: 150.0000 mg | ORAL_TABLET | Freq: Every day | ORAL | 0 refills | Status: DC

## 2020-07-17 NOTE — Progress Notes (Signed)
[facility] OP Progress Note  07/17/2020 2:06 PM LEATTA ALEWINE  MRN:  [id number]  Chief Complaint:  Chief Complaint    Follow-up     HPI: BROOKELYN GAYNOR is a 75 year old Caucasian female, divorced, lives in [place], has a history of bipolar disorder, GAD, primary insomnia, tardive dyskinesia, Sjogren's syndrome, Raynaud's phenomena, hypothyroidism, IBS, hyperlipidemia, fibromyalgia, GERD was evaluated in office today.  Patient today reports since being on the higher dosage of Seroquel 150 mg her mood symptoms have improved.  She does not feel anxious, hypomanic or agitated at this time.  She is tolerating the Seroquel well.  She denies any significant sadness.  She reports she is compliant on medications.  Denies side effects.  She reports her appetite as improving.  She continues to have an aide coming in to help her 3 times a week Mondays Wednesdays and Fridays from 3 PM to 7 PM.  She does help her with a lot of housework and that has made a difference in her life.  Patient reports sleep is good.  She reports recently she noticed vaginal bleeding and hence had an appointment with a urologist as well as gynecologist.  Patient reports she is currently scheduled for an ultrasound.  That does make her anxious however she has been coping okay.  Patient denies any suicidality, homicidality or perceptual disturbances.  Patient denies any other concerns today.  Visit Diagnosis:    ICD-10-CM   1. Bipolar I disorder, most recent episode (or current) manic (HCC)  F31.10 QUEtiapine Fumarate (SEROQUEL XR) 150 MG 24 hr tablet  2. GAD (generalized anxiety disorder)  F41.1   3. Primary insomnia  F51.01   4. Tardive dyskinesia  G24.01   5. High risk medication use  Z79.899 CBC With Diff/Platelet    Past Psychiatric History: I have reviewed past psychiatric history from progress note on 06/17/2017.  Inpatient mental health admission at [facility] 01/02/2020- 01/11/2020.  Past  Medical History:  Past Medical History:  Diagnosis Date  . Anginal pain ([place])   . Anxiety   . Bipolar 1 disorder ([place])   . Bipolar disorder ([place])   . Coronary artery disease   . Depression   . Fatigue   . Fibromyalgia   . GERD (gastroesophageal reflux disease)   . Heart disease   . Heart failure ([place])   . HLD (hyperlipidemia)   . HOH (hard of hearing)   . Hypothyroidism   . IBS (irritable bowel syndrome)   . Pelvic pain in female   . Perianal lesion    high grade sil lesion- keratinizing type  . RA (rheumatoid arthritis) ([place])   . Raynaud phenomenon   . Sjogren's disease ([place])   . Sleep apnea   . Vaginal Pap smear, abnormal     Past Surgical History:  Procedure Laterality Date  . CATARACT EXTRACTION W/PHACO Left 02/23/2017   Procedure: CATARACT EXTRACTION PHACO AND INTRAOCULAR LENS PLACEMENT (IOC);  Surgeon: Birder Robson, MD;  Location: [facility] ORS;  Service: Ophthalmology;  Laterality: Left;  [place] 00:35 AP% 9.6 CDE 3.47 Fluid pack lot # [id number]  . CATARACT EXTRACTION W/PHACO Right 03/30/2017   Procedure: CATARACT EXTRACTION PHACO AND INTRAOCULAR LENS PLACEMENT ([place]);  Surgeon: Birder Robson, MD;  Location: [facility] ORS;  Service: Ophthalmology;  Laterality: Right;  [place] 00:32.6 AP% 13.8 CDE 4.51 Fluid Pack Lot # [id number]  . EYE SURGERY    . TONSILLECTOMY      Family Psychiatric History: I have reviewed family psychiatric  history from progress note on 06/17/2017  Family History:  Family History  Problem Relation Age of Onset  . Heart failure Father   . Parkinson's disease Mother   . Bipolar disorder Mother   . Depression Sister   . Schizophrenia Paternal Grandmother   . Diabetes Maternal Grandmother   . Pancreatic cancer Maternal Grandmother   . Cancer Neg Hx   . Heart disease Neg Hx   . Breast cancer Neg Hx     Social History: I have reviewed social history from progress note on 06/17/2017 Social History   Socioeconomic History  . Marital status: Divorced     Spouse name: Not on file  . Number of children: 2  . Years of education: Not on file  . Highest education level: Master's degree (e.g., MA, MS, MEng, MEd, MSW, MBA)  Occupational History    Comment: retired2  Tobacco Use  . Smoking status: Never Smoker  . Smokeless tobacco: Former Network engineer  . Vaping Use: Never used  Substance and Sexual Activity  . Alcohol use: No    Alcohol/week: 0.0 standard drinks  . Drug use: No  . Sexual activity: Never    Birth control/protection: Post-menopausal  Other Topics Concern  . Not on file  Social History Narrative  . Not on file   Social Determinants of Health   Financial Resource Strain: Not on file  Food Insecurity: Not on file  Transportation Needs: Not on file  Physical Activity: Not on file  Stress: Not on file  Social Connections: Not on file    Allergies:  Allergies  Allergen Reactions  . Amoxicillin-Pot Clavulanate Nausea Only and Other (See Comments)    Diarrhea Has patient had a PCN reaction causing immediate rash, facial/tongue/throat swelling, SOB or lightheadedness with hypotension:  Has patient had a PCN reaction causing severe rash involving mucus membranes or skin necrosis: Has patient had a PCN reaction that required hospitalization:  Has patient had a PCN reaction occurring within the last 10 years: If all of the above answers are "NO", then may proceed with Cephalosporin use.   Marland Kitchen Oxycodone-Acetaminophen Nausea Only  . Erythromycin Nausea Only  . Azithromycin Nausea And Vomiting  . Codeine   . Macrolides And Ketolides   . Oxycodone-Acetaminophen   . Penicillins Other (See Comments)    Has patient had a PCN reaction causing immediate rash, facial/tongue/throat swelling, SOB or lightheadedness with hypotension: NO Has patient had a PCN reaction causing severe rash involving mucus membranes or skin necrosis: NO Has patient had a PCN reaction that required hospitalization: NO Has patient had a PCN  reaction occurring within the last 10 years: NO If all of the above answers are "NO", then may proceed with Cephalosporin use.   . Remeron [Mirtazapine]     Side effect-likely tardive dyskinesia  . Tramadol Nausea And Vomiting  . Zolpidem     Sleep walking  . Lamotrigine Rash    LAMICTAL  . Sulfa Antibiotics Rash    Metabolic Disorder Labs: Lab Results  Component Value Date   HGBA1C 5.3 11/22/2017   Lab Results  Component Value Date   PROLACTIN 33.0 (H) 11/22/2017   Lab Results  Component Value Date   CHOL 222 (H) 01/02/2020   TRIG 190 (H) 01/02/2020   HDL 69 01/02/2020   CHOLHDL 3.2 01/02/2020   VLDL 38 01/02/2020   LDLCALC 115 (H) 01/02/2020   LDLCALC 119 (H) 11/22/2017   Lab Results  Component Value Date  TSH 2.678 01/02/2020    Therapeutic Level Labs: No results found for: LITHIUM Lab Results  Component Value Date   VALPROATE 60 01/02/2020   VALPROATE 61 07/05/2019   No components found for:  CBMZ  Current Medications: Current Outpatient Medications  Medication Sig Dispense Refill  . Acetaminophen 500 MG capsule Take 500 mg by mouth. Takes 500 mg bid prn    . aspirin 81 MG chewable tablet Chew 81 mg by mouth daily with breakfast.    . Calcium Citrate-Vitamin D 200-250 MG-UNIT TABS Take 1 tablet by mouth daily after breakfast.     . carbamazepine (TEGRETOL XR) 200 MG 12 hr tablet Take 2 tablets (400 mg total) by mouth 2 (two) times daily. 360 tablet 1  . conjugated estrogens (PREMARIN) vaginal cream Estrogen Cream Instruction Discard applicator Apply pea sized amount to tip of finger to urethra before bed. Wash hands well after application. Use Monday, Wednesday and Friday 30 g 0  . divalproex (DEPAKOTE ER) 500 MG 24 hr tablet Take 1 tablet twice a day 180 tablet 1  . estradiol (ESTRACE VAGINAL) 0.1 MG/GM vaginal cream Apply 0.5mg  (pea-sized amount)  just inside the vaginal introitus with a finger-tip on Monday, Wednesday and Friday nights. 42.5 g 12  .  fluticasone (FLONASE) 50 MCG/ACT nasal spray     . folic acid (FOLVITE) 1 MG tablet Take 2 mg by mouth daily with breakfast.     . gabapentin (NEURONTIN) 300 MG capsule Take 1 tablet twice a day 180 capsule 1  . ibandronate (BONIVA) 150 MG tablet every 30 (thirty) days.    Marland Kitchen levothyroxine (SYNTHROID, LEVOTHROID) 75 MCG tablet Take 75 mcg by mouth daily before breakfast.     . methotrexate 2.5 MG tablet Take 15 mg by mouth every Wednesday. WEDNESDAYS AT LUNCH    . mirabegron ER (MYRBETRIQ) 50 MG TB24 tablet Take 1 tablet (50 mg total) by mouth daily. 30 tablet 11  . Multiple Vitamins-Minerals (MULTIVITAMIN WITH MINERALS) tablet Take 1 tablet by mouth daily with breakfast.     . pravastatin (PRAVACHOL) 40 MG tablet Take 40 mg by mouth daily.     . temazepam (RESTORIL) 7.5 MG capsule Take 1 capsule (7.5 mg total) by mouth at bedtime as needed for sleep. 30 capsule 2  . QUEtiapine Fumarate (SEROQUEL XR) 150 MG 24 hr tablet Take 1 tablet (150 mg total) by mouth at bedtime. 90 tablet 0   No current facility-administered medications for this visit.     Musculoskeletal: Strength & Muscle Tone: UTA Gait & Station: Walks with cane Patient leans: N/A  Psychiatric Specialty Exam: Review of Systems  Genitourinary: Positive for vaginal bleeding.       Overactive bladder  Psychiatric/Behavioral: The patient is nervous/anxious.   All other systems reviewed and are negative.   Blood pressure (!) 156/89, pulse 74, weight 106 lb (48.1 kg).Body mass index is 20.7 kg/m.  General Appearance: Casual  Eye Contact:  Fair  Speech:  Clear and Coherent  Volume:  Normal  Mood:  Anxious  Affect:  Congruent  Thought Process:  Goal Directed and Descriptions of Associations: Intact  Orientation:  NA  Thought Content: Logical   Suicidal Thoughts:  No  Homicidal Thoughts:  No  Memory:  Immediate;   Fair Recent;   Fair Remote;   Fair  Judgement:  Fair  Insight:  Fair  Psychomotor Activity:  Normal   Concentration:  Concentration: Fair and Attention Span: Fair  Recall:  [employer] of  Knowledge: Fair  Language: Fair  Akathisia:  No  Handed:  Right  AIMS (if indicated): done  Assets:  Communication Skills Desire for Improvement Housing Social Support  ADL's:  Intact  Cognition: WNL  Sleep:  Fair   Screenings: [facility] Office Visit from 07/17/2020 in [facility] Office Visit from 06/14/2020 in [facility] Office Visit from 05/30/2020 in [facility] Admission (Discharged) from 01/02/2020 in [facility] Visit from 11/11/2017 in [facility] Total Score 0 0 0 0 0    AUDIT   Flowsheet Row Admission (Discharged) from 01/02/2020 in [facility]  Alcohol Use Disorder Identification Test Final Score (AUDIT) 0    GAD-7   Flowsheet Row Office Visit from 07/17/2020 in [facility]  Total GAD-7 Score 5    PHQ2-9   [place] Visit from 07/17/2020 in [facility] Visit from 06/14/2020 in [facility] Visit from 05/09/2020 in [facility] Video Visit from 04/19/2020 in [facility]  PHQ-2 Total Score 0 1 3 0  PHQ-9 Total Score 5 5 6  --    [place] Visit from 07/17/2020 in [facility] Visit from 06/14/2020 in [facility] Visit from 05/09/2020 in [facility] No Risk Error: Q3, 4, or 5 should not be populated when Q2 is No Low Risk       Assessment and Plan: KILEEN GETCHELL is a 75 year old Caucasian female, divorced, lives in [place], has a history of bipolar disorder, multiple medical problems including recent inpatient mental health  admission presented to the clinic in person for follow-up.  Patient is currently struggling with multiple medical problems including recent vaginal bleeding and is currently scheduled for further testing.  Patient however reports good response to Seroquel with regards to her recent hypomanic symptoms and sleep problems.  Plan as noted below.  Plan Bipolar disorder in remission Tegretol extended release 400 mg p.o. twice daily Tegretol level-01/02/2020-8.8-therapeutic Depakote extended release 500 mg p.o. twice daily Depakote level-01/02/2020- 60-therapeutic Gabapentin 300 mg p.o. twice daily Seroquel extended release 150 mg p.o. nightly  Insomnia-stable Temazepam 7.5 mg p.o. nightly as needed She has been limiting use Seroquel also helps  TD-chronic Will monitor closely  High risk medication use-we will order CBC with differential.  Patient will go to lab.  She has lab slip available.  Reviewed notes per Ms.McGowan-dated 07/04/2020- Overactive bladder/urinary frequency-continue Myrbetriq 50 mg p.o. daily Vaginal atrophy-urethral carbuncle-vaginal estrogen cream. Gross hematuria-resolved Abnormal Pap smear and has seen Dr. Marcelline Mates   I have reviewed notes per Dr. Rayburn Go 06/27/2020 Postmenopausal bleeding, vaginal atrophy Blood and urine but again may have been vaginal source. Will order a pelvic ultrasound to rule out other causes . Currently using Premarin cream for vaginal atrophy.  We will continue to coordinate care with [name].  Patient has upcoming appointment on May 19.  Patient with elevated blood pressure reading today will follow up with primary care provider.  Follow-up in clinic in the office in 6 weeks from now.  This note was generated in part or whole with voice recognition software. Voice recognition is usually quite accurate but there are transcription errors that can and very often do occur. I apologize for any typographical errors that were not  detected and corrected.  Ursula Alert, MD 07/18/2020, 5:59 PM

## 2020-07-19 ENCOUNTER — Telehealth: Payer: Self-pay | Admitting: Psychiatry

## 2020-07-19 LAB — CBC WITH DIFF/PLATELET
Basophils Absolute: 0 10*3/uL (ref 0.0–0.2)
Basos: 1 %
EOS (ABSOLUTE): 0.3 10*3/uL (ref 0.0–0.4)
Eos: 5 %
Hematocrit: 46.4 % (ref 34.0–46.6)
Hemoglobin: 15.2 g/dL (ref 11.1–15.9)
Immature Grans (Abs): 0 10*3/uL (ref 0.0–0.1)
Immature Granulocytes: 0 %
Lymphocytes Absolute: 2 10*3/uL (ref 0.7–3.1)
Lymphs: 35 %
MCH: 34.5 pg — ABNORMAL HIGH (ref 26.6–33.0)
MCHC: 32.8 g/dL (ref 31.5–35.7)
MCV: 105 fL — ABNORMAL HIGH (ref 79–97)
Monocytes Absolute: 0.5 10*3/uL (ref 0.1–0.9)
Monocytes: 10 %
Neutrophils Absolute: 2.8 10*3/uL (ref 1.4–7.0)
Neutrophils: 49 %
Platelets: 137 10*3/uL — ABNORMAL LOW (ref 150–450)
RBC: 4.41 x10E6/uL (ref 3.77–5.28)
RDW: 13.2 % (ref 11.7–15.4)
WBC: 5.6 10*3/uL (ref 3.4–10.8)

## 2020-07-19 NOTE — Telephone Encounter (Signed)
I have reviewed her CBC with differential-all within normal limits except platelet count-low at 137.  This is lower than what it was 1 year ago.  Patient does have chronic rheumatoid arthritis and multiple other medical problems.  We will have patient contact primary care provider for further evaluation and management.  We will have Stephanie [name] CMA contact patient to let her know.

## 2020-07-23 ENCOUNTER — Other Ambulatory Visit: Payer: Medicare PPO

## 2020-07-24 ENCOUNTER — Other Ambulatory Visit: Payer: Self-pay

## 2020-07-24 ENCOUNTER — Ambulatory Visit
Admission: RE | Admit: 2020-07-24 | Discharge: 2020-07-24 | Disposition: A | Discharge status: Home / Self Care | Payer: Medicare PPO | Source: Ambulatory Visit | Attending: Obstetrics and Gynecology | Admitting: Obstetrics and Gynecology

## 2020-07-24 DIAGNOSIS — N950 Postmenopausal bleeding: Secondary | ICD-10-CM | POA: Diagnosis not present

## 2020-07-26 ENCOUNTER — Telehealth: Payer: Self-pay

## 2020-07-26 ENCOUNTER — Telehealth: Payer: Self-pay | Admitting: Obstetrics and Gynecology

## 2020-07-26 ENCOUNTER — Ambulatory Visit: Payer: Medicare PPO | Admitting: Psychiatry

## 2020-07-26 NOTE — Telephone Encounter (Signed)
Call transferred from front desk-  Pt is requesting her u/s results. Results given- neg. Pt also states she is not technology savvy and prefers her results to be called to her and mailed to her home.   Made a note in blue sticky. Mailed results to pt.

## 2020-07-26 NOTE — Telephone Encounter (Signed)
Pt called requesting her [place] results- requested to speak with a nurse. Please Advise.

## 2020-07-26 NOTE — Telephone Encounter (Signed)
Thank you for taking care of this Stephanie [name].

## 2020-07-26 NOTE — Telephone Encounter (Signed)
pt thought she had an appt for today. her vitals were taken  bp 134/85 pulse 82  weight 107.2lbs. she is doing very well she is dressed really nice and her overall appearance is great. she stated that she seen Miguel Dibble ,LCSW yesterday. she stated the seroquel xr changed manufactures and now she taking a different color pill and instead of three pill she takin one and that she likes that she only takin one pill.  She stated that she didn't understand how her appt got mixed up. But I told her that I'm really sorry but that I will take her vitals and let Dr. Shea Evans know how well she was doing.  Her next appt is June 22,

## 2020-07-27 ENCOUNTER — Other Ambulatory Visit: Payer: Self-pay | Admitting: Psychiatry

## 2020-07-27 DIAGNOSIS — F5101 Primary insomnia: Secondary | ICD-10-CM

## 2020-07-29 ENCOUNTER — Telehealth: Payer: Self-pay | Admitting: Obstetrics and Gynecology

## 2020-07-29 NOTE — Telephone Encounter (Signed)
Patient called requesting to speak with Dr.Cherry's nurse about [place] results- pt wants a call back. I made staff aware they requested message be taken and they would call back. Made patient aware.

## 2020-07-29 NOTE — Telephone Encounter (Signed)
Pt stated that she developed a UTI  Possible from the vaginal ultrasound. Pt stated she spoke to her urologist who prescribed her medication to help with the pain and discomfort. Pt stated that she was doing better.

## 2020-07-30 NOTE — Telephone Encounter (Signed)
Please see another phone encounter.  

## 2020-08-28 ENCOUNTER — Ambulatory Visit (INDEPENDENT_AMBULATORY_CARE_PROVIDER_SITE_OTHER): Payer: Medicare PPO | Admitting: Psychiatry

## 2020-08-28 ENCOUNTER — Telehealth: Payer: Self-pay

## 2020-08-28 ENCOUNTER — Encounter: Payer: Self-pay | Admitting: Psychiatry

## 2020-08-28 ENCOUNTER — Other Ambulatory Visit: Payer: Self-pay

## 2020-08-28 VITALS — BP 117/80 | HR 80 | Temp 97.1°F | Wt 107.6 lb

## 2020-08-28 DIAGNOSIS — F5101 Primary insomnia: Secondary | ICD-10-CM | POA: Diagnosis not present

## 2020-08-28 DIAGNOSIS — F411 Generalized anxiety disorder: Secondary | ICD-10-CM

## 2020-08-28 DIAGNOSIS — G2401 Drug induced subacute dyskinesia: Secondary | ICD-10-CM | POA: Diagnosis not present

## 2020-08-28 DIAGNOSIS — Z79899 Other long term (current) drug therapy: Secondary | ICD-10-CM

## 2020-08-28 DIAGNOSIS — F3174 Bipolar disorder, in full remission, most recent episode manic: Secondary | ICD-10-CM | POA: Diagnosis not present

## 2020-08-28 NOTE — Progress Notes (Deleted)
[facility] MD OP Progress Note  08/28/2020 9:29 AM Stephanie [name]  MRN:  [id number]  Chief Complaint:  Chief Complaint   Follow-up    HPI: *** Visit Diagnosis: No diagnosis found.  Past Psychiatric History:   Past Medical History:  Past Medical History:  Diagnosis Date   Anginal pain ([place])    Anxiety    Bipolar 1 disorder ([place])    Bipolar disorder ([place])    Coronary artery disease    Depression    Fatigue    Fibromyalgia    GERD (gastroesophageal reflux disease)    Heart disease    Heart failure (HCC)    HLD (hyperlipidemia)    HOH (hard of hearing)    Hypothyroidism    IBS (irritable bowel syndrome)    Pelvic pain in female    Perianal lesion    high grade sil lesion- keratinizing type   RA (rheumatoid arthritis) ([place])    Raynaud phenomenon    Sjogren's disease ([place])    Sleep apnea    Vaginal Pap smear, abnormal     Past Surgical History:  Procedure Laterality Date   CATARACT EXTRACTION W/PHACO Left 02/23/2017   Procedure: CATARACT EXTRACTION PHACO AND INTRAOCULAR LENS PLACEMENT ([place]);  Surgeon: Birder Robson, MD;  Location: [facility] ORS;  Service: Ophthalmology;  Laterality: Left;  [place] 00:35 AP% 9.6 CDE 3.47 Fluid pack lot # [id number]   CATARACT EXTRACTION W/PHACO Right 03/30/2017   Procedure: CATARACT EXTRACTION PHACO AND INTRAOCULAR LENS PLACEMENT (I[facility]);  Surgeon: Birder Robson, MD;  Location: [facility] ORS;  Service: Ophthalmology;  Laterality: Right;  [place] 00:32.6 AP% 13.8 CDE 4.51 Fluid Pack Lot # [id number]   EYE SURGERY     TONSILLECTOMY      Family Psychiatric History:   Family History:  Family History  Problem Relation Age of Onset   Heart failure Father    Parkinson's disease Mother    Bipolar disorder Mother    Depression Sister    Schizophrenia Paternal Grandmother    Diabetes Maternal Grandmother    Pancreatic cancer Maternal Grandmother    Cancer Neg Hx    Heart disease Neg Hx    Breast cancer Neg Hx     Social History:  Social History    Socioeconomic History   Marital status: Divorced    Spouse name: Not on file   Number of children: 2   Years of education: Not on file   Highest education level: Master's degree (e.g., MA, MS, MEng, MEd, MSW, MBA)  Occupational History    Comment: retired2  Tobacco Use   Smoking status: Never   Smokeless tobacco: Former  Scientific laboratory technician Use: Never used  Substance and Sexual Activity   Alcohol use: No    Alcohol/week: 0.0 standard drinks   Drug use: No   Sexual activity: Never    Birth control/protection: Post-menopausal  Other Topics Concern   Not on file  Social History Narrative   Not on file   Social Determinants of Health   Financial Resource Strain: Not on file  Food Insecurity: Not on file  Transportation Needs: Not on file  Physical Activity: Not on file  Stress: Not on file  Social Connections: Not on file    Allergies:  Allergies  Allergen Reactions   Amoxicillin-Pot Clavulanate Nausea Only and Other (See Comments)    Diarrhea Has patient had a PCN reaction causing immediate rash, facial/tongue/throat swelling, SOB or lightheadedness with hypotension:  Has patient had a PCN reaction causing  severe rash involving mucus membranes or skin necrosis: Has patient had a PCN reaction that required hospitalization:  Has patient had a PCN reaction occurring within the last 10 years: If all of the above answers are "NO", then may proceed with Cephalosporin use.    Oxycodone-Acetaminophen Nausea Only   Erythromycin Nausea Only   Azithromycin Nausea And Vomiting   Codeine    Macrolides And Ketolides    Oxycodone-Acetaminophen    Penicillins Other (See Comments)    Has patient had a PCN reaction causing immediate rash, facial/tongue/throat swelling, SOB or lightheadedness with hypotension: NO Has patient had a PCN reaction causing severe rash involving mucus membranes or skin necrosis: NO Has patient had a PCN reaction that required hospitalization: NO Has  patient had a PCN reaction occurring within the last 10 years: NO If all of the above answers are "NO", then may proceed with Cephalosporin use.    Remeron [Mirtazapine]     Side effect-likely tardive dyskinesia   Tramadol Nausea And Vomiting   Zolpidem     Sleep walking   Lamotrigine Rash    LAMICTAL   Sulfa Antibiotics Rash    Metabolic Disorder Labs: Lab Results  Component Value Date   HGBA1C 5.3 11/22/2017   Lab Results  Component Value Date   PROLACTIN 33.0 (H) 11/22/2017   Lab Results  Component Value Date   CHOL 222 (H) 01/02/2020   TRIG 190 (H) 01/02/2020   HDL 69 01/02/2020   CHOLHDL 3.2 01/02/2020   VLDL 38 01/02/2020   LDLCALC 115 (H) 01/02/2020   LDLCALC 119 (H) 11/22/2017   Lab Results  Component Value Date   TSH 2.678 01/02/2020    Therapeutic Level Labs: No results found for: LITHIUM Lab Results  Component Value Date   VALPROATE 60 01/02/2020   VALPROATE 61 07/05/2019   No components found for:  CBMZ  Current Medications: Current Outpatient Medications  Medication Sig Dispense Refill   Acetaminophen 500 MG capsule Take 500 mg by mouth. Takes 500 mg bid prn     aspirin 81 MG chewable tablet Chew 81 mg by mouth daily with breakfast.     Calcium Citrate-Vitamin D 200-250 MG-UNIT TABS Take 1 tablet by mouth daily after breakfast.      carbamazepine (TEGRETOL XR) 200 MG 12 hr tablet Take 2 tablets (400 mg total) by mouth 2 (two) times daily. 360 tablet 1   conjugated estrogens (PREMARIN) vaginal cream Estrogen Cream Instruction Discard applicator Apply pea sized amount to tip of finger to urethra before bed. Wash hands well after application. Use Monday, Wednesday and Friday 30 g 0   divalproex (DEPAKOTE ER) 500 MG 24 hr tablet Take 1 tablet twice a day 180 tablet 1   estradiol (ESTRACE VAGINAL) 0.1 MG/GM vaginal cream Apply 0.5mg  (pea-sized amount)  just inside the vaginal introitus with a finger-tip on Monday, Wednesday and Friday nights. 42.5 g 12    fluticasone (FLONASE) 50 MCG/ACT nasal spray      folic acid (FOLVITE) 1 MG tablet Take 2 mg by mouth daily with breakfast.      gabapentin (NEURONTIN) 300 MG capsule Take 1 tablet twice a day 180 capsule 1   ibandronate (BONIVA) 150 MG tablet every 30 (thirty) days.     levothyroxine (SYNTHROID, LEVOTHROID) 75 MCG tablet Take 75 mcg by mouth daily before breakfast.      methotrexate 2.5 MG tablet Take 15 mg by mouth every Wednesday. WEDNESDAYS AT LUNCH     mirabegron ER (  MYRBETRIQ) 50 MG TB24 tablet Take 1 tablet (50 mg total) by mouth daily. 30 tablet 11   Multiple Vitamins-Minerals (MULTIVITAMIN WITH MINERALS) tablet Take 1 tablet by mouth daily with breakfast.      pravastatin (PRAVACHOL) 40 MG tablet Take 40 mg by mouth daily.      QUEtiapine Fumarate (SEROQUEL XR) 150 MG 24 hr tablet Take 1 tablet (150 mg total) by mouth at bedtime. 90 tablet 0   temazepam (RESTORIL) 7.5 MG capsule TAKE 1 CAPSULE BY MOUTH EVERY NIGHT AT BEDTIME 30 capsule 2   No current facility-administered medications for this visit.     Musculoskeletal: Strength & Muscle Tone: {desc; muscle tone:32375} Gait & Station: {PE GAIT ED KKXF:81829} Patient leans: {Patient Leans:21022755}  Psychiatric Specialty Exam: Review of Systems  Blood pressure 117/80, pulse 80, temperature (!) 97.1 F (36.2 C), temperature source Temporal, weight 107 lb 9.6 oz (48.8 kg).Body mass index is 21.01 kg/m.  General Appearance: {Appearance:22683}  Eye Contact:  {BHH EYE CONTACT:22684}  Speech:  {Speech:22685}  Volume:  {Volume (PAA):22686}  Mood:  {BHH MOOD:22306}  Affect:  {Affect (PAA):22687}  Thought Process:  {Thought Process (PAA):22688}  Orientation:  {BHH ORIENTATION (PAA):22689}  Thought Content: {Thought Content:22690}   Suicidal Thoughts:  {ST/HT (PAA):22692}  Homicidal Thoughts:  {ST/HT (PAA):22692}  Memory:  {BHH MEMORY:22881}  Judgement:  {Judgement (PAA):22694}  Insight:  {Insight (PAA):22695}  Psychomotor  Activity:  {Psychomotor (PAA):22696}  Concentration:  {Concentration:21399}  Recall:  {BHH GOOD/FAIR/POOR:22877}  Fund of Knowledge: {BHH GOOD/FAIR/POOR:22877}  Language: {BHH GOOD/FAIR/POOR:22877}  Akathisia:  {BHH YES OR NO:22294}  Handed:  {Handed:22697}  AIMS (if indicated): {Desc; done/not:10129}  Assets:  {Assets (PAA):22698}  ADL's:  {BHH HBZ'J:69678}  Cognition: {chl bhh cognition:304700322}  Sleep:  {BHH GOOD/FAIR/POOR:22877}   Screenings: AIMS    Flowsheet Row Office Visit from 07/17/2020 in [facility] Visit from 06/14/2020 in [place] Office Visit from 05/30/2020 in [place] Admission (Discharged) from 01/02/2020 in [facility] Visit from 11/11/2017 in [facility] Total Score 0 0 0 0 0      AUDIT    Flowsheet Row Admission (Discharged) from 01/02/2020 in [facility]  Alcohol Use Disorder Identification Test Final Score (AUDIT) 0      GAD-7    Flowsheet Row Office Visit from 07/17/2020 in [facility]  Total GAD-7 Score 5      PHQ2-9    [place] Visit from 07/17/2020 in [facility] Office Visit from 06/14/2020 in [facility] Office Visit from 05/09/2020 in [facility] Video Visit from 04/19/2020 in [facility]  PHQ-2 Total Score 0 1 3 0  PHQ-9 Total Score 5 5 6  --      Flowsheet Row Office Visit from 07/17/2020 in [facility] Office Visit from 06/14/2020 in [facility] Office Visit from 05/09/2020 in [facility] No Risk Error: Q3, 4, or 5 should not be populated when Q2 is No Low Risk        Assessment and Plan: ***   Ursula Alert, MD 08/28/2020, 9:29 AM

## 2020-08-28 NOTE — Progress Notes (Signed)
[facility] MD OP Progress Note  08/28/2020 6:05 PM Stephanie [name]  MRN:  [id number]  Chief Complaint:  Chief Complaint   Follow-up    HPI: Stephanie [name] is a 75 year old Caucasian female, divorced, lives in [place], has a history of bipolar disorder, GAD, primary insomnia, tardive dyskinesia, Sjogren's syndrome, Raynaud's phenomena, hypothyroidism, IBS, hyperlipidemia, fibromyalgia, GERD was evaluated in office today.  Patient today reports overall she is doing well.  She reports her mood symptoms are stable.  She denies any significant depression or manic symptoms.  She reports sleep is overall okay on the current combination.  She uses the temazepam as needed only.  Patient reports her appetite as better.  She has gained a few pounds.  Which is good since she had lost a few pounds in the past few months.  She continues to get Meals on Wheels.  She also has an aide who comes in to help her Mondays Wednesdays and Fridays from 3 PM to 7 PM.  She reports she really likes her since she has been very helpful.  Her family continues to be supportive.  She denies any suicidality, homicidality or perceptual disturbances.  Patient is compliant on medications.  Denies side effects.  She wonders whether the Seroquel dosage should be readjusted when it gets close to change of seasons.  She reports her mood symptoms gets worse when seasons change.  Her last hospital admission was in October 2021.    Visit Diagnosis:    ICD-10-CM   1. Bipolar disorder, in full remission, most recent episode manic ([place])  F31.74     2. GAD (generalized anxiety disorder)  F41.1     3. Primary insomnia  F51.01     4. Tardive dyskinesia  G24.01     5. High risk medication use  Z79.899       Past Psychiatric History: Reviewed past psychiatric history from progress note on 06/17/2017.  [facility] health admission [facility]-01/02/2020 - 01/11/2020.  Past Medical History:  Past Medical  History:  Diagnosis Date   Anginal pain ([place])    Anxiety    Bipolar 1 disorder ([place])    Bipolar disorder ([place])    Coronary artery disease    Depression    Fatigue    Fibromyalgia    GERD (gastroesophageal reflux disease)    Heart disease    Heart failure (HCC)    HLD (hyperlipidemia)    HOH (hard of hearing)    Hypothyroidism    IBS (irritable bowel syndrome)    Pelvic pain in female    Perianal lesion    high grade sil lesion- keratinizing type   RA (rheumatoid arthritis) ([place])    Raynaud phenomenon    Sjogren's disease ([place])    Sleep apnea    Vaginal Pap smear, abnormal     Past Surgical History:  Procedure Laterality Date   CATARACT EXTRACTION W/PHACO Left 02/23/2017   Procedure: CATARACT EXTRACTION PHACO AND INTRAOCULAR LENS PLACEMENT ([place]);  Surgeon: Birder Robson, MD;  Location: [facility] ORS;  Service: Ophthalmology;  Laterality: Left;  [place] 00:35 AP% 9.6 CDE 3.47 Fluid pack lot # [id number]   CATARACT EXTRACTION W/PHACO Right 03/30/2017   Procedure: CATARACT EXTRACTION PHACO AND INTRAOCULAR LENS PLACEMENT ([facility]);  Surgeon: Birder Robson, MD;  Location: [facility] ORS;  Service: Ophthalmology;  Laterality: Right;  [place] 00:32.6 AP% 13.8 CDE 4.51 Fluid Pack Lot # [id number]   EYE SURGERY     TONSILLECTOMY  Family Psychiatric History: Reviewed family psychiatric history from progress note on 06/17/2017  Family History:  Family History  Problem Relation Age of Onset   Heart failure Father    Parkinson's disease Mother    Bipolar disorder Mother    Depression Sister    Schizophrenia Paternal Grandmother    Diabetes Maternal Grandmother    Pancreatic cancer Maternal Grandmother    Cancer Neg Hx    Heart disease Neg Hx    Breast cancer Neg Hx     Social History: Reviewed social history from progress note on 06/17/2017 Social History   Socioeconomic History   Marital status: Divorced    Spouse name: Not on file   Number of children: 2   Years of education: Not on  file   Highest education level: Master's degree (e.g., MA, MS, MEng, MEd, MSW, MBA)  Occupational History    Comment: retired2  Tobacco Use   Smoking status: Never   Smokeless tobacco: Former  Scientific laboratory technician Use: Never used  Substance and Sexual Activity   Alcohol use: No    Alcohol/week: 0.0 standard drinks   Drug use: No   Sexual activity: Never    Birth control/protection: Post-menopausal  Other Topics Concern   Not on file  Social History Narrative   Not on file   Social Determinants of Health   Financial Resource Strain: Not on file  Food Insecurity: Not on file  Transportation Needs: Not on file  Physical Activity: Not on file  Stress: Not on file  Social Connections: Not on file    Allergies:  Allergies  Allergen Reactions   Amoxicillin-Pot Clavulanate Nausea Only and Other (See Comments)    Diarrhea Has patient had a PCN reaction causing immediate rash, facial/tongue/throat swelling, SOB or lightheadedness with hypotension:  Has patient had a PCN reaction causing severe rash involving mucus membranes or skin necrosis: Has patient had a PCN reaction that required hospitalization:  Has patient had a PCN reaction occurring within the last 10 years: If all of the above answers are "NO", then may proceed with Cephalosporin use.    Oxycodone-Acetaminophen Nausea Only   Erythromycin Nausea Only   Azithromycin Nausea And Vomiting   Codeine    Macrolides And Ketolides    Oxycodone-Acetaminophen    Penicillins Other (See Comments)    Has patient had a PCN reaction causing immediate rash, facial/tongue/throat swelling, SOB or lightheadedness with hypotension: NO Has patient had a PCN reaction causing severe rash involving mucus membranes or skin necrosis: NO Has patient had a PCN reaction that required hospitalization: NO Has patient had a PCN reaction occurring within the last 10 years: NO If all of the above answers are "NO", then may proceed with Cephalosporin  use.    Remeron [Mirtazapine]     Side effect-likely tardive dyskinesia   Tramadol Nausea And Vomiting   Zolpidem     Sleep walking   Lamotrigine Rash    LAMICTAL   Sulfa Antibiotics Rash    Metabolic Disorder Labs: Lab Results  Component Value Date   HGBA1C 5.3 11/22/2017   Lab Results  Component Value Date   PROLACTIN 33.0 (H) 11/22/2017   Lab Results  Component Value Date   CHOL 222 (H) 01/02/2020   TRIG 190 (H) 01/02/2020   HDL 69 01/02/2020   CHOLHDL 3.2 01/02/2020   VLDL 38 01/02/2020   LDLCALC 115 (H) 01/02/2020   LDLCALC 119 (H) 11/22/2017   Lab Results  Component Value Date  TSH 2.678 01/02/2020    Therapeutic Level Labs: No results found for: LITHIUM Lab Results  Component Value Date   VALPROATE 60 01/02/2020   VALPROATE 61 07/05/2019   No components found for:  CBMZ  Current Medications: Current Outpatient Medications  Medication Sig Dispense Refill   Acetaminophen 500 MG capsule Take 500 mg by mouth. Takes 500 mg bid prn     aspirin 81 MG chewable tablet Chew 81 mg by mouth daily with breakfast.     Calcium Citrate-Vitamin D 200-250 MG-UNIT TABS Take 1 tablet by mouth daily after breakfast.      carbamazepine (TEGRETOL XR) 200 MG 12 hr tablet Take 2 tablets (400 mg total) by mouth 2 (two) times daily. 360 tablet 1   conjugated estrogens (PREMARIN) vaginal cream Estrogen Cream Instruction Discard applicator Apply pea sized amount to tip of finger to urethra before bed. Wash hands well after application. Use Monday, Wednesday and Friday 30 g 0   divalproex (DEPAKOTE ER) 500 MG 24 hr tablet Take 1 tablet twice a day 180 tablet 1   estradiol (ESTRACE VAGINAL) 0.1 MG/GM vaginal cream Apply 0.5mg  (pea-sized amount)  just inside the vaginal introitus with a finger-tip on Monday, Wednesday and Friday nights. 42.5 g 12   fluticasone (FLONASE) 50 MCG/ACT nasal spray      folic acid (FOLVITE) 1 MG tablet Take 2 mg by mouth daily with breakfast.       gabapentin (NEURONTIN) 300 MG capsule Take 1 tablet twice a day 180 capsule 1   ibandronate (BONIVA) 150 MG tablet every 30 (thirty) days.     levothyroxine (SYNTHROID, LEVOTHROID) 75 MCG tablet Take 75 mcg by mouth daily before breakfast.      methotrexate 2.5 MG tablet Take 15 mg by mouth every Wednesday. WEDNESDAYS AT LUNCH     mirabegron ER (MYRBETRIQ) 50 MG TB24 tablet Take 1 tablet (50 mg total) by mouth daily. 30 tablet 11   Multiple Vitamins-Minerals (MULTIVITAMIN WITH MINERALS) tablet Take 1 tablet by mouth daily with breakfast.      pravastatin (PRAVACHOL) 40 MG tablet Take 40 mg by mouth daily.      QUEtiapine Fumarate (SEROQUEL XR) 150 MG 24 hr tablet Take 1 tablet (150 mg total) by mouth at bedtime. 90 tablet 0   temazepam (RESTORIL) 7.5 MG capsule TAKE 1 CAPSULE BY MOUTH EVERY NIGHT AT BEDTIME 30 capsule 2   No current facility-administered medications for this visit.     Musculoskeletal: Strength & Muscle Tone: within normal limits Gait & Station:  Walks with cane Patient leans: N/A  Psychiatric Specialty Exam: Review of Systems  Musculoskeletal:  Positive for arthralgias.  Psychiatric/Behavioral:  Negative for agitation, behavioral problems, confusion, decreased concentration, dysphoric mood, hallucinations, self-injury, sleep disturbance and suicidal ideas. The patient is not nervous/anxious and is not hyperactive.   All other systems reviewed and are negative.  Blood pressure 117/80, pulse 80, temperature (!) 97.1 F (36.2 C), temperature source Temporal, weight 107 lb 9.6 oz (48.8 kg).Body mass index is 21.01 kg/m.  General Appearance: Fairly Groomed  Eye Contact:  Fair  Speech:  Clear and Coherent  Volume:  Normal  Mood:  Euthymic  Affect:  Appropriate  Thought Process:  Goal Directed and Descriptions of Associations: Circumstantial  Orientation:  Full (Time, Place, and Person)  Thought Content: Logical   Suicidal Thoughts:  No  Homicidal Thoughts:  No   Memory:  Immediate;   Fair Recent;   Fair Remote;   Fair  Judgement:  Fair  Insight:  Fair  Psychomotor Activity:  Normal  Concentration:  Concentration: Fair and Attention Span: Fair  Recall:  [employer] of Knowledge: Fair  Language: Fair  Akathisia:  No  Handed:  Right  AIMS (if indicated): done  Assets:  Communication Skills Desire for [place] Talents/Skills Vocational/Educational  ADL's:  Intact  Cognition: WNL  Sleep:  Good   Screenings: [place] Office Visit from 07/17/2020 in [place] Visit from 06/14/2020 in [place] Visit from 05/30/2020 in [place] Admission (Discharged) from 01/02/2020 in [place] Visit from 11/11/2017 in [place] Total Score 0 0 0 0 0      AUDIT    Flowsheet Row Admission (Discharged) from 01/02/2020 in [facility]  Alcohol Use Disorder Identification Test Final Score (AUDIT) 0      GAD-7    Flowsheet Row Office Visit from 07/17/2020 in [facility]  Total GAD-7 Score 5      PHQ2-9    [place] Visit from 08/28/2020 in [facility] Visit from 07/17/2020 in [facility] Visit from 06/14/2020 in [place] Visit from 05/09/2020 in [place] Video Visit from 04/19/2020 in [place]  PHQ-2 Total Score 0 0 1 3 0  PHQ-9 Total Score 3 5 5 6  --      [place] Visit from 07/17/2020 in [place] [place] [facility] Visit from 06/14/2020 in [facility] Office Visit from 05/09/2020 in [place] [place] No  Risk Error: Q3, 4, or 5 should not be populated when Q2 is No Low Risk        Assessment and Plan: MADDY GRAHAM is a 75 year old Caucasian female, divorced, lives in [place], has a history of bipolar disorder, multiple medical problems presented to the clinic for follow-up.  Patient is currently stable.  Plan as noted below.  Plan Bipolar disorder in remission Tegretol extended release 400 mg p.o. twice daily Tegretol level-01/02/2020-8.8-therapeutic Depakote extended release 5 mg p.o. twice daily Depakote level-01/02/2020-60-therapeutic Gabapentin 300 mg p.o. twice daily Seroquel extended release 150 mg p.o. nightly Will not make any additional changes with her medications at this time.  Insomnia-stable Temazepam 7.5 mg p.o. nightly as needed Seroquel will also help.  TD-chronic-likely resolved Did not observe it in session today.  Patient denies any concerns.  High risk medication use--reviewed and discussed labs dated 08/20/2020-CBC with differential-platelet count-139, MCV-elevated at 103.2-platelet count improved compared to February.  However she will discuss with primary care. AST-23 within normal limits, ALT-14 within normal limits Creatinine-low at 0.5  We will continue to coordinate care with Miguel Dibble.  Discussed patient treatment plan with Otila Kluver.  Patient to continue to follow-up with her providers for her medical problems.  Follow-up in clinic in 2 months or sooner if needed.  I have spent atleast  35 minutes face to face with patient today which includes the time spent for preparing to see the patient ( e.g., review of test, records ), obtaining and to review and separately obtained history , ordering medications and test ,psychoeducation and supportive psychotherapy and care coordination,as well as documenting clinical information in electronic health record,interpreting and communication of test results   This note was generated in part or whole  with voice  recognition software. Voice recognition is usually quite accurate but there are transcription errors that can and very often do occur. I apologize for any typographical errors that were not detected and corrected.     Ursula Alert, MD 08/29/2020, 5:45 PM

## 2020-08-28 NOTE — Patient Instructions (Signed)
Seasonal Affective Disorder Seasonal affective disorder (SAD) is a type of depression. It is when you feel depressed at specific times of the year. SAD is most common during late fall and winter when the days are shorter and most people spend less time outdoors. This is why SAD is also known as the "winter blues." SAD occurs less commonlyin the spring or summer. SAD can be mild to severe, and it can interfere with work, school,relationships, and normal daily activities. What are the causes? The cause of this condition is not known. It may be related to changes in brainchemistry that are caused by having less exposure to daylight. What increases the risk? You are more likely to develop this condition if: You are female. You live far [place] or far [place] of the equator. These areas get less sunlight and have longer winter seasons. You have a personal history of depression or bipolar disorder. You have a family history of mental health conditions. What are the signs or symptoms? Symptoms of this condition include: Depressed mood, which may involve: Feeling sad or teary. Having crying spells. Irritability. Trouble sleeping, or sleeping more than usual. Loss of interest in activities that you usually enjoy. Feelings of guilt or worthlessness. Restlessness or loss of energy. Difficulty concentrating, remembering, or making decisions. Significant change in appetite or weight. Thinking about self-harm or attempting suicide. Symptoms associated with the winter pattern of SAD include: Overeating or craving sweet foods. Weight gain. Avoiding social situations (social withdrawal), or feeling like "hibernating." Sleeping more than usual. Symptoms associated with the less common summer pattern of SAD include: Loss of appetite. Weight loss. Trouble sleeping. Episodes of violent behavior (in severe cases). How is this diagnosed? This condition is usually diagnosed through an assessment with your  health care provider. You will be asked about your moods, thoughts, and behaviors. You will also be asked about your medical history, any major life changes, and anymedicines and substances that you use. You may have a physical exam and blood tests to rule out other possible causes of your symptoms. You may be referred to a mental health specialist for moreevaluation. How is this treated? Treatment for this condition may include: Light therapy. This therapy involves sitting in front of a light source for 15-30 minutes every day. The light source may be: A light box. A dawn simulator or sunrise clock. This is a timer-activated light source that copies the sunrise by slowly becoming brighter. This can help to activate your body's internal clock. Antidepressant medicine. Cognitive behavioral therapy (CBT). CBT is a form of talk therapy that helps to identify and change negative thoughts that are associated with SAD. Changes to your dietary, exercise, or sleeping habits. A healthy lifestyle may help to prevent or relieve symptoms. Follow these instructions at home: Medicines Take over-the-counter and prescription medicines only as told by your healthcare provider. If you are taking antidepressant medicines, ask your health care provider what side effects you should be aware of. Talk with your health care provider before you start taking any new prescription or over-the-counter medicines, herbs, or supplements. Lifestyle Eat a healthy diet that includes fruits and vegetables, whole grains, and lean proteins. Get plenty of sleep. To improve your sleep, make sure you: Keep your bedroom dark and cool. Go to sleep and wake up at about the same time every day. Do not keep screens (such as a TV or smartphone) in your bedroom. Limit your screen time starting a few hours before bedtime. Exercise regularly. Limit alcohol  and caffeine as told by your health care provider. General instructions Make your home  and work environment as sunny or bright as possible. Open window blinds and move furniture closer to windows. Spend as much time outside as possible. Use light therapy for 15-30 minutes every day, or as often as directed. Attend CBT therapy sessions as directed. Keep all follow-up visits as told by your health care provider and therapist. This is important. Contact a health care provider if: Your symptoms do not get better or they get worse. You have trouble taking care of yourself. You are using drugs or alcohol to cope with your symptoms. You have side effects from medicines. Get help right away if: You have thoughts about hurting yourself or others. If you ever feel like you may hurt yourself or others, or have thoughts about taking your own life, get help right away. You can go to your nearest emergency department or call: Your local emergency services (911 in the [place] A suicide crisis helpline, such as the [facility] at 848 677 1589. This is open 24 hours a day. Summary Seasonal affective disorder (SAD) is a type of depression that is associated with specific times of the year (usually fall and winter). This condition may be treated with light therapy, talk therapy, and antidepressant medicines. To help treat your condition, take good care of yourself and make home and work as sunny and bright as possible. Seek help right away if you have thoughts about hurting yourself or others. This information is not intended to replace advice given to you by your health care provider. Make sure you discuss any questions you have with your healthcare provider. Document Revised: 07/12/2019 Document Reviewed: 08/17/2019 Elsevier Patient Education  [place].

## 2020-08-29 NOTE — Telephone Encounter (Signed)
error 

## 2020-09-30 DIAGNOSIS — G4730 Sleep apnea, unspecified: Secondary | ICD-10-CM | POA: Insufficient documentation

## 2020-10-03 NOTE — Progress Notes (Signed)
07/19/19 10:25 AM   Stephanie [name] 15-May-1945 [id number]  Referring provider: Derinda Late, MD [phone] [address] [facility] [place],  [place] [address] Chief Complaint  Patient presents with   Follow-up    [facility] follow-up    Urological history: 1. OAB -contributing factors of age, vaginal atrophy and Seroquel -manage with Myrbetriq 50 mg daily  2. Urge incontinence -contributing factors of age, vaginal atrophy and Seroquel  3. Urethral caruncle -applying the vaginal estrogen cream three nights weekly   HPI: Stephanie LEISHERis a 75y.o. F who presents today for follow up.    She has had an UTI since she was last seen by [place]  She was having symptoms of dysuria and increase in urinary frequency.  She was treated with culture appropriate antibiotics.    She is asymptomatic at this visit.  Patient denies any modifying or aggravating factors.  Patient denies any gross hematuria, dysuria or suprapubic/flank pain.  Patient denies any fevers, chills, nausea or vomiting.    She continues to apply the vaginal estrogen cream three nights weekly.   PMH: Past Medical History:  Diagnosis Date   Anginal pain ([facility]    Anxiety    Bipolar 1 disorder ([place]    Bipolar disorder ([place]    Coronary artery disease    Depression    Fatigue    Fibromyalgia    GERD (gastroesophageal reflux disease)    Heart disease    Heart failure (HCC)    HLD (hyperlipidemia)    HOH (hard of hearing)    Hypothyroidism    IBS (irritable bowel syndrome)    Pelvic pain in female    Perianal lesion    high grade sil lesion- keratinizing type   RA (rheumatoid arthritis) ([facility]    Raynaud phenomenon    Sjogren's disease ([place] [facility]    Sleep apnea    Vaginal Pap smear, abnormal     Surgical History: Past Surgical History:  Procedure Laterality Date   CATARACT EXTRACTION W/PHACO Left 02/23/2017   Procedure: CATARACT EXTRACTION PHACO AND INTRAOCULAR LENS PLACEMENT ([facility]ringer;   Surgeon: PBirder Robson MD;  Location: [facility] ORS;  Service: Ophthalmology;  Laterality: Left;  [place]:35 AP% 9.6 CDE 3.47 Fluid pack lot # [id number]   CATARACT EXTRACTION W/PHACO Right 03/30/2017   Procedure: CATARACT EXTRACTION PHACO AND INTRAOCULAR LENS PLACEMENT (IOC);  Surgeon: PBirder Robson MD;  Location: [facility] ORS;  Service: Ophthalmology;  Laterality: Right;  [place]00:32.6 AP% 13.8 CDE 4.51 Fluid Pack Lot # [id number]   EYE SURGERY     TONSILLECTOMY      Home Medications:  Allergies as of 10/04/2020       Reactions   Amoxicillin-pot Clavulanate Nausea Only, Other (See Comments)   Diarrhea Has patient had a PCN reaction causing immediate rash, facial/tongue/throat swelling, SOB or lightheadedness with hypotension:  Has patient had a PCN reaction causing severe rash involving mucus membranes or skin necrosis: Has patient had a PCN reaction that required hospitalization:  Has patient had a PCN reaction occurring within the last 10 years: If all of the above answers are "NO", then may proceed with Cephalosporin use.   Oxycodone-acetaminophen Nausea Only   Erythromycin Nausea Only   Azithromycin Nausea And Vomiting   Codeine    Macrolides And Ketolides    Oxycodone-acetaminophen    Penicillins Other (See Comments)   Has patient had a PCN reaction causing immediate rash, facial/tongue/throat swelling, SOB or lightheadedness with hypotension:  NO Has patient had a PCN reaction causing severe rash involving mucus membranes or skin necrosis: NO Has patient had a PCN reaction that required hospitalization: NO Has patient had a PCN reaction occurring within the last 10 years: NO If all of the above answers are "NO", then may proceed with Cephalosporin use.   Remeron [mirtazapine]    Side effect-likely tardive dyskinesia   Tramadol Nausea And Vomiting   Zolpidem    Sleep walking   Lamotrigine Rash   LAMICTAL   Sulfa Antibiotics Rash        Medication List        Accurate  as of October 04, 2020 10:25 AM. If you have any questions, ask your nurse or doctor.          Acetaminophen 500 MG capsule Take 500 mg by mouth. Takes 500 mg bid prn   aspirin 81 MG chewable tablet Chew 81 mg by mouth daily with breakfast.   Calcium Citrate-Vitamin D 200-250 MG-UNIT Tabs Take 1 tablet by mouth daily after breakfast.   carbamazepine 200 MG 12 hr tablet Commonly known as: TEGRETOL XR Take 2 tablets (400 mg total) by mouth 2 (two) times daily.   divalproex 500 MG 24 hr tablet Commonly known as: DEPAKOTE ER Take 1 tablet twice a day   estradiol 0.1 MG/GM vaginal cream Commonly known as: ESTRACE VAGINAL Apply 0.'5mg'$  (pea-sized amount)  just inside the vaginal introitus with a finger-tip on Monday, Wednesday and Friday nights.   fluticasone 50 MCG/ACT nasal spray Commonly known as: FLONASE   folic acid 1 MG tablet Commonly known as: FOLVITE Take 2 mg by mouth daily with breakfast.   gabapentin 300 MG capsule Commonly known as: NEURONTIN Take 1 tablet twice a day   ibandronate 150 MG tablet Commonly known as: BONIVA every 30 (thirty) days.   levothyroxine 75 MCG tablet Commonly known as: SYNTHROID Take 75 mcg by mouth daily before breakfast.   methotrexate 2.5 MG tablet Take 15 mg by mouth every Wednesday. WEDNESDAYS AT LUNCH   mirabegron ER 50 MG Tb24 tablet Commonly known as: Myrbetriq Take 1 tablet (50 mg total) by mouth daily.   multivitamin with minerals tablet Take 1 tablet by mouth daily with breakfast.   pravastatin 40 MG tablet Commonly known as: PRAVACHOL Take 40 mg by mouth daily.   Premarin vaginal cream Generic drug: conjugated estrogens Estrogen Cream Instruction Discard applicator Apply pea sized amount to tip of finger to urethra before bed. Wash hands well after application. Use Monday, Wednesday and Friday   QUEtiapine Fumarate 150 MG 24 hr tablet Commonly known as: SEROquel XR Take 1 tablet (150 mg total) by mouth at  bedtime.   temazepam 7.5 MG capsule Commonly known as: RESTORIL TAKE 1 CAPSULE BY MOUTH EVERY NIGHT AT BEDTIME        Allergies:  Allergies  Allergen Reactions   Amoxicillin-Pot Clavulanate Nausea Only and Other (See Comments)    Diarrhea Has patient had a PCN reaction causing immediate rash, facial/tongue/throat swelling, SOB or lightheadedness with hypotension:  Has patient had a PCN reaction causing severe rash involving mucus membranes or skin necrosis: Has patient had a PCN reaction that required hospitalization:  Has patient had a PCN reaction occurring within the last 10 years: If all of the above answers are "NO", then may proceed with Cephalosporin use.    Oxycodone-Acetaminophen Nausea Only   Erythromycin Nausea Only   Azithromycin Nausea And Vomiting   Codeine    Macrolides And  Ketolides    Oxycodone-Acetaminophen    Penicillins Other (See Comments)    Has patient had a PCN reaction causing immediate rash, facial/tongue/throat swelling, SOB or lightheadedness with hypotension: NO Has patient had a PCN reaction causing severe rash involving mucus membranes or skin necrosis: NO Has patient had a PCN reaction that required hospitalization: NO Has patient had a PCN reaction occurring within the last 10 years: NO If all of the above answers are "NO", then may proceed with Cephalosporin use.    Remeron [Mirtazapine]     Side effect-likely tardive dyskinesia   Tramadol Nausea And Vomiting   Zolpidem     Sleep walking   Lamotrigine Rash    LAMICTAL   Sulfa Antibiotics Rash    Family History: Family History  Problem Relation Age of Onset   Heart failure Father    Parkinson's disease Mother    Bipolar disorder Mother    Depression Sister    Schizophrenia Paternal Grandmother    Diabetes Maternal Grandmother    Pancreatic cancer Maternal Grandmother    Cancer Neg Hx    Heart disease Neg Hx    Breast cancer Neg Hx     Social History:  reports that she has  never smoked. She has quit using smokeless tobacco. She reports that she does not drink alcohol and does not use drugs.  ROS For pertinent review of systems please refer to history of present illness  Physical Exam: BP 122/87   Pulse 90   Ht 5' (1.524 m)   Wt 106 lb (48.1 kg)   BMI 20.70 kg/m   Constitutional:  Well nourished. Alert and oriented, No acute distress. HEENT: [place] AT, mask in place.  Trachea midline Cardiovascular: No clubbing, cyanosis, or edema. Respiratory: Normal respiratory effort, no increased work of breathing. GU: No CVA tenderness.  No bladder fullness or masses.  Atrophic external genitalia, sparse pubic hair distribution, no lesions.  Normal urethral meatus, no lesions, no prolapse, no discharge.   No urethral masses, tenderness and/or tenderness.  Neurologic: Grossly intact, no focal deficits, moving all 4 extremities. Psychiatric: Normal mood and affect.    Laboratory Data: N/A Pertinent imaging N/A    Assessment & Plan:    1. Overactive bladder / urinary frequency -continue Myrbetriq 50 mg daily   2.  Urge incontinence See above   3. Vaginal atrophy/Urethral caruncle -continue vaginal estrogen cream, but only need to apply it Monday, Wednesday and Friday nights -urethral caruncle has resolved -explained that the use of the vaginal estrogen cream has been shown to reduce UTI's and encourage consist use    [facility] [address] [place], [place] [address] [phone]  Zara Council, PA-C

## 2020-10-04 ENCOUNTER — Other Ambulatory Visit: Payer: Self-pay

## 2020-10-04 ENCOUNTER — Encounter: Payer: Self-pay | Admitting: Urology

## 2020-10-04 ENCOUNTER — Ambulatory Visit: Payer: Medicare PPO | Admitting: Urology

## 2020-10-04 VITALS — BP 122/87 | HR 90 | Ht 60.0 in | Wt 106.0 lb

## 2020-10-04 DIAGNOSIS — N952 Postmenopausal atrophic vaginitis: Secondary | ICD-10-CM

## 2020-10-04 DIAGNOSIS — N3281 Overactive bladder: Secondary | ICD-10-CM | POA: Diagnosis not present

## 2020-10-15 ENCOUNTER — Telehealth: Payer: Self-pay

## 2020-10-15 NOTE — Telephone Encounter (Signed)
Thank you for the information.

## 2020-10-15 NOTE — Telephone Encounter (Signed)
I called patient to check on her. Had not heard from her in awhile.  She states that she was doing fine but she wanted to talk to you about increasing her medication because of the seasonal changing soon but that it can wait until her next appt.  She also wanted to let you know, she has to have a stress test and a echo done on the 25th

## 2020-10-28 ENCOUNTER — Telehealth: Payer: Self-pay | Admitting: Urology

## 2020-10-28 NOTE — Telephone Encounter (Signed)
Notified patient as instructed,.  

## 2020-10-28 NOTE — Telephone Encounter (Signed)
.  left message to have patient return my call.  

## 2020-10-28 NOTE — Telephone Encounter (Signed)
Patient stated she was told if she had any recurring problems (uti related) for her to reach back out to you. She is having burning, pain and frequency since this morning. She has an appt with her pcp tomorrow. She said feel free to contact her if needed.

## 2020-10-28 NOTE — Telephone Encounter (Signed)
I recommend that if she is seeing her PCP tomorrow to have them check her for an UTI.

## 2020-10-29 ENCOUNTER — Ambulatory Visit (INDEPENDENT_AMBULATORY_CARE_PROVIDER_SITE_OTHER): Payer: Medicare PPO | Admitting: Psychiatry

## 2020-10-29 ENCOUNTER — Telehealth: Payer: Self-pay | Admitting: Urology

## 2020-10-29 ENCOUNTER — Other Ambulatory Visit: Payer: Self-pay

## 2020-10-29 ENCOUNTER — Encounter: Payer: Self-pay | Admitting: Psychiatry

## 2020-10-29 VITALS — BP 138/80 | HR 86 | Temp 97.7°F | Wt 104.4 lb

## 2020-10-29 DIAGNOSIS — N952 Postmenopausal atrophic vaginitis: Secondary | ICD-10-CM

## 2020-10-29 DIAGNOSIS — N3281 Overactive bladder: Secondary | ICD-10-CM

## 2020-10-29 DIAGNOSIS — G2401 Drug induced subacute dyskinesia: Secondary | ICD-10-CM

## 2020-10-29 DIAGNOSIS — F3174 Bipolar disorder, in full remission, most recent episode manic: Secondary | ICD-10-CM

## 2020-10-29 DIAGNOSIS — F5101 Primary insomnia: Secondary | ICD-10-CM

## 2020-10-29 DIAGNOSIS — Z79899 Other long term (current) drug therapy: Secondary | ICD-10-CM

## 2020-10-29 DIAGNOSIS — F411 Generalized anxiety disorder: Secondary | ICD-10-CM

## 2020-10-29 DIAGNOSIS — R310 Gross hematuria: Secondary | ICD-10-CM

## 2020-10-29 MED ORDER — QUETIAPINE FUMARATE ER 150 MG PO TB24: 150.0000 mg | ORAL_TABLET | Freq: Every day | ORAL | 0 refills | Status: DC

## 2020-10-29 MED ORDER — GABAPENTIN 300 MG PO CAPS: ORAL_CAPSULE | ORAL | 1 refills | Status: DC

## 2020-10-29 MED ORDER — DIVALPROEX SODIUM ER 500 MG PO TB24: ORAL_TABLET | ORAL | 1 refills | Status: DC

## 2020-10-29 MED ORDER — CARBAMAZEPINE ER 200 MG PO TB12: 400.0000 mg | ORAL_TABLET | Freq: Two times a day (BID) | ORAL | 1 refills | Status: DC

## 2020-10-29 NOTE — Telephone Encounter (Signed)
Pt LMOM to let Larene [place] know she had another UTI.  She is being treated by pcp w/Cipro.  She said she has had serveral UTI's and wanted to make [facility] aware.
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
## 2020-11-08 ENCOUNTER — Telehealth: Payer: Self-pay

## 2020-11-08 DIAGNOSIS — F5101 Primary insomnia: Secondary | ICD-10-CM

## 2020-11-08 DIAGNOSIS — F3174 Bipolar disorder, in full remission, most recent episode manic: Secondary | ICD-10-CM

## 2020-11-08 DIAGNOSIS — F411 Generalized anxiety disorder: Secondary | ICD-10-CM

## 2020-11-08 MED ORDER — QUETIAPINE FUMARATE ER 200 MG PO TB24: 200.0000 mg | ORAL_TABLET | Freq: Every day | ORAL | 0 refills | Status: DC

## 2020-11-08 NOTE — Telephone Encounter (Signed)
Returned call to patient.  Patient reports that the winter months coming and her having more and more sleep problems she is interested in a dose increase of Seroquel.  We will increase Seroquel to 200 mg at bedtime.  Provided medication education including the risk of falls.  She will be cautious.  [place], spoke to pharmacist regarding the change in medication dose.

## 2020-11-08 NOTE — Telephone Encounter (Signed)
Medication management - Patient left a message she had previously discussed going up on her Seroquel if problems with mood and the change in seasons.  Pt. stated she feels this may be necessary as she is having more problems with sleep and having to take Restoril each night now.  Would like and increase in Seroquel if Dr. Shea Evans agrees.

## 2020-11-22 NOTE — Telephone Encounter (Signed)
Stephanie Riis, PA-C  You 2 days ago   She had micro heme on her urinalysis when she had her infection in August.  We need to get a repeat UA to ensure the micro heme cleared with the treating of her infection.

## 2020-11-22 NOTE — Telephone Encounter (Signed)
.  left message to have patient return my call.  

## 2020-11-28 NOTE — Telephone Encounter (Signed)
.  left message to have patient return my call.  

## 2020-12-02 NOTE — Telephone Encounter (Signed)
Spoke with patient and advised results  Lab appt scheduled  

## 2020-12-02 NOTE — Addendum Note (Signed)
Addended by: Despina Hidden on: 12/02/2020 11:37 AM   Modules accepted: Orders

## 2020-12-02 NOTE — Telephone Encounter (Signed)
.  left message to have patient return my call.  

## 2020-12-05 ENCOUNTER — Other Ambulatory Visit: Payer: Medicare PPO

## 2020-12-06 ENCOUNTER — Other Ambulatory Visit: Payer: Medicare PPO

## 2020-12-16 ENCOUNTER — Telehealth: Payer: Self-pay | Admitting: Urology

## 2020-12-16 NOTE — Telephone Encounter (Signed)
Pt called office to make an appt concerning recurrent UTI's.  She has appt 10/13 @ 11.  She wanted you to know her mother had the same issue and was put on a suppressive antibiotic and it worked for her.  Just F.Y.I.

## 2020-12-18 NOTE — Progress Notes (Incomplete)
12/18/20 12:50 PM   Stephanie [name] 19-Jul-1945 [id number]  Referring provider:  Derinda Late, MD [phone] [facility] [place],  [place] [address] No chief complaint on file.   Urological history  1. OAB -contributing factors of age, vaginal atrophy and Seroquel -manage with Myrbetriq 50 mg daily   2. Urge incontinence -contributing factors of age, vaginal atrophy and Seroquel   3. Urethral caruncle -applying the vaginal estrogen cream three nights weekly    HPI: Stephanie [name] is a 75 y.o.female who presents today for further evaluation of frequent UTIs.   [facility] for UTI she was prescribed ciprofloxacin 500 mg. Urinalysis showed 1+ leukocytes but was otherwise unremarkable.   She reports ***      PMH: Past Medical History:  Diagnosis Date   Anginal pain ([place])    Anxiety    Bipolar 1 disorder (HCC)    Bipolar disorder ([place])    Coronary artery disease    Depression    Fatigue    Fibromyalgia    GERD (gastroesophageal reflux disease)    Heart disease    Heart failure (HCC)    HLD (hyperlipidemia)    HOH (hard of hearing)    Hypothyroidism    IBS (irritable bowel syndrome)    Pelvic pain in female    Perianal lesion    high grade sil lesion- keratinizing type   RA (rheumatoid arthritis) ([place])    Raynaud phenomenon    Sjogren's disease ([place])    Sleep apnea    Vaginal Pap smear, abnormal     Surgical History: Past Surgical History:  Procedure Laterality Date   CATARACT EXTRACTION W/PHACO Left 02/23/2017   Procedure: CATARACT EXTRACTION PHACO AND INTRAOCULAR LENS PLACEMENT ([place]);  Surgeon: Birder Robson, MD;  Location: [facility] ORS;  Service: Ophthalmology;  Laterality: Left;  [place] 00:35 AP% 9.6 CDE 3.47 Fluid pack lot # [id number]   CATARACT EXTRACTION W/PHACO Right 03/30/2017   Procedure: CATARACT EXTRACTION PHACO AND INTRAOCULAR LENS PLACEMENT (I[facility]);  Surgeon: Birder Robson, MD;  Location: [facility] ORS;  Service: Ophthalmology;  Laterality: Right;  [place] 00:32.6 AP% 13.8 CDE 4.51 Fluid Pack Lot # [id number]   EYE SURGERY     TONSILLECTOMY      Home Medications:  Allergies as of 12/19/2020       Reactions   Amoxicillin-pot Clavulanate Nausea Only, Other (See Comments)   Diarrhea Has patient had a PCN reaction causing immediate rash, facial/tongue/throat swelling, SOB or lightheadedness with hypotension:  Has patient had a PCN reaction causing severe rash involving mucus membranes or skin necrosis: Has patient had a PCN reaction that required hospitalization:  Has patient had a PCN reaction occurring within the last 10 years: If all of the above answers are "NO", then may proceed with Cephalosporin use.   Oxycodone-acetaminophen Nausea Only   Erythromycin Nausea Only   Azithromycin Nausea And Vomiting   Codeine    Macrolides And Ketolides    Oxycodone-acetaminophen    Penicillins Other (See Comments)   Has patient had a PCN reaction causing immediate rash, facial/tongue/throat swelling, SOB or lightheadedness with hypotension: NO Has patient had a PCN reaction causing severe rash involving mucus membranes or skin necrosis: NO Has patient had a PCN reaction that required hospitalization: NO Has patient had a PCN reaction occurring within the last 10 years: NO If all of the above answers are "NO", then may proceed with Cephalosporin use.  Remeron [mirtazapine]    Side effect-likely tardive dyskinesia   Tramadol Nausea And Vomiting   Zolpidem    Sleep walking   Lamotrigine Rash   LAMICTAL   Sulfa Antibiotics Rash        Medication List        Accurate as of December 18, 2020 12:50 PM. If you have any questions, ask your nurse or doctor.          Acetaminophen 500 MG capsule Take 500 mg by mouth. Takes 500 mg bid prn   aspirin 81 MG chewable tablet Chew 81 mg by mouth daily with breakfast.   Calcium Citrate-Vitamin D 200-250 MG-UNIT  Tabs Take 1 tablet by mouth daily after breakfast.   carbamazepine 200 MG 12 hr tablet Commonly known as: TEGRETOL XR Take 2 tablets (400 mg total) by mouth 2 (two) times daily.   divalproex 500 MG 24 hr tablet Commonly known as: DEPAKOTE ER Take 1 tablet twice a day   estradiol 0.1 MG/GM vaginal cream Commonly known as: ESTRACE VAGINAL Apply 0.46m (pea-sized amount)  just inside the vaginal introitus with a finger-tip on Monday, Wednesday and Friday nights.   fluticasone 50 MCG/ACT nasal spray Commonly known as: FLONASE   folic acid 1 MG tablet Commonly known as: FOLVITE Take 2 mg by mouth daily with breakfast.   gabapentin 300 MG capsule Commonly known as: NEURONTIN Take 1 tablet twice a day   ibandronate 150 MG tablet Commonly known as: BONIVA every 30 (thirty) days.   levothyroxine 75 MCG tablet Commonly known as: SYNTHROID Take 75 mcg by mouth daily before breakfast.   methotrexate 2.5 MG tablet Take 15 mg by mouth every Wednesday. WEDNESDAYS AT LUNCH   mirabegron ER 50 MG Tb24 tablet Commonly known as: Myrbetriq Take 1 tablet (50 mg total) by mouth daily.   multivitamin with minerals tablet Take 1 tablet by mouth daily with breakfast.   pravastatin 40 MG tablet Commonly known as: PRAVACHOL Take 40 mg by mouth daily.   Premarin vaginal cream Generic drug: conjugated estrogens Estrogen Cream Instruction Discard applicator Apply pea sized amount to tip of finger to urethra before bed. Wash hands well after application. Use Monday, Wednesday and Friday   QUEtiapine 200 MG 24 hr tablet Commonly known as: SEROquel XR Take 1 tablet (200 mg total) by mouth at bedtime. STOP SEROQUEL XR 150 MG AT BEDTIME   temazepam 7.5 MG capsule Commonly known as: RESTORIL TAKE 1 CAPSULE BY MOUTH EVERY NIGHT AT BEDTIME        Allergies:  Allergies  Allergen Reactions   Amoxicillin-Pot Clavulanate Nausea Only and Other (See Comments)    Diarrhea Has patient had a PCN  reaction causing immediate rash, facial/tongue/throat swelling, SOB or lightheadedness with hypotension:  Has patient had a PCN reaction causing severe rash involving mucus membranes or skin necrosis: Has patient had a PCN reaction that required hospitalization:  Has patient had a PCN reaction occurring within the last 10 years: If all of the above answers are "NO", then may proceed with Cephalosporin use.    Oxycodone-Acetaminophen Nausea Only   Erythromycin Nausea Only   Azithromycin Nausea And Vomiting   Codeine    Macrolides And Ketolides    Oxycodone-Acetaminophen    Penicillins Other (See Comments)    Has patient had a PCN reaction causing immediate rash, facial/tongue/throat swelling, SOB or lightheadedness with hypotension: NO Has patient had a PCN reaction causing severe rash involving mucus membranes or skin necrosis: NO Has patient  had a PCN reaction that required hospitalization: NO Has patient had a PCN reaction occurring within the last 10 years: NO If all of the above answers are "NO", then may proceed with Cephalosporin use.    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 She has quit using smokeless tobacco. She reports that she does not drink alcohol and does not use drugs.   Physical Exam: There were no vitals taken for this visit.  Constitutional:  Alert and oriented, No acute distress. HEENT: [place] AT, moist mucus membranes.   Trachea midline, no masses. Cardiovascular: No clubbing, cyanosis, or edema. Respiratory: Normal respiratory effort, no increased work of breathing. GI: Abdomen is soft, nontender, nondistended, no abdominal masses GU: No CVA tenderness Lymph: No cervical or inguinal lymphadenopathy. Skin: No rashes, bruises or suspicious lesions. Neurologic: Grossly intact, no focal deficits, moving all 4 extremities. Psychiatric: Normal mood and affect.  Laboratory Data:  Lab Results  Component Value Date   CREATININE 0.41 (L) 01/02/2020    Ref Range & Units 2 mo ago  Glucose 70 - 110 mg/dL 82   Sodium 136 - 145 mmol/L 141   Potassium 3.6 - 5.1 mmol/L 4.5   Chloride 97 - 109 mmol/L 105   Carbon Dioxide (CO2) 22.0 - 32.0 mmol/L 29.7   Urea Nitrogen (BUN) 7 - 25 mg/dL 19   Creatinine 0.6 - 1.1 mg/dL 0.6   Glomerular Filtration Rate (eGFR), MDRD Estimate >60 mL/min/1.73sq m 97   Calcium 8.7 - 10.3 mg/dL 9.4   AST  8 - 39 U/L 23   ALT  5 - 38 U/L 14   Alk Phos (alkaline Phosphatase) 34 - 104 U/L 50   Albumin 3.5 - 4.8 g/dL 4.1   Bilirubin, Total 0.3 - 1.2 mg/dL 0.5   Protein, Total 6.1 - 7.9 g/dL 6.3   A/G Ratio 1.0 - 5.0 gm/dL 1.9   Resulting Agency  [facility] - [facility]  Specimen Collected: 10/18/20 08:32 Last Resulted: 10/18/20 15:07  Received From: [facility]     Ref Range & Units 2 mo ago  Thyroid Stimulating Hormone (TSH) 0.450-5.330 uIU/ml uIU/mL 1.098   Comment: Reference Range for Pregnant Females >= 91 yrs old:  Normal Range for 1st trimester: 0.05-3.70 ulU/ml  Normal Range for 2nd trimester: 0.31-4.35 ulU/ml  Resulting Agency  [facility] - [facility]  Specimen Collected: 10/18/20 08:32 Last Resulted: 10/18/20 15:08  Received From: [facility]  Result Received: 10/28/20 09:21    Lab Results  Component Value Date   HGBA1C 5.3 11/22/2017    Urinalysis   Pertinent Imaging:   Assessment & Plan:     No follow-ups on  file.  [facility] [address] [place], [place] [address] [phone]  I,Kailey Littlejohn,acting as a  scribe for [facility], PA-C.,have documented all relevant documentation on the behalf of SHANNON MCGOWAN, PA-C,as directed by  [facility], PA-C while in the presence of SHANNON MCGOWAN, PA-C.

## 2020-12-19 ENCOUNTER — Ambulatory Visit: Payer: Medicare PPO | Admitting: Urology

## 2020-12-24 ENCOUNTER — Other Ambulatory Visit: Payer: Self-pay

## 2020-12-24 ENCOUNTER — Encounter: Payer: Self-pay | Admitting: Psychiatry

## 2020-12-24 ENCOUNTER — Other Ambulatory Visit
Admission: RE | Admit: 2020-12-24 | Discharge: 2020-12-24 | Disposition: A | Discharge status: Home / Self Care | Payer: Medicare PPO | Attending: Ophthalmology | Admitting: Ophthalmology

## 2020-12-24 ENCOUNTER — Ambulatory Visit (INDEPENDENT_AMBULATORY_CARE_PROVIDER_SITE_OTHER): Payer: Medicare PPO | Admitting: Psychiatry

## 2020-12-24 VITALS — BP 131/81 | HR 84 | Temp 98.1°F | Wt 104.8 lb

## 2020-12-24 DIAGNOSIS — H532 Diplopia: Secondary | ICD-10-CM | POA: Diagnosis present

## 2020-12-24 DIAGNOSIS — Z79899 Other long term (current) drug therapy: Secondary | ICD-10-CM

## 2020-12-24 DIAGNOSIS — F411 Generalized anxiety disorder: Secondary | ICD-10-CM

## 2020-12-24 DIAGNOSIS — F5101 Primary insomnia: Secondary | ICD-10-CM | POA: Diagnosis not present

## 2020-12-24 DIAGNOSIS — G2401 Drug induced subacute dyskinesia: Secondary | ICD-10-CM | POA: Diagnosis not present

## 2020-12-24 DIAGNOSIS — F3174 Bipolar disorder, in full remission, most recent episode manic: Secondary | ICD-10-CM

## 2020-12-24 NOTE — Progress Notes (Signed)
[place] MD OP Progress Note  12/24/2020 12:26 PM Stephanie [name]  MRN:  [id number]  Chief Complaint:  Chief Complaint   Follow-up; Depression; Insomnia    HPI: Stephanie [name] is a 75 year old Caucasian female, divorced, lives in [place], has a history of bipolar disorder, GAD, primary insomnia, tardive dyskinesia, Sjogren's syndrome, Raynaud's phenomena, hypothyroidism, coronary artery disease, IBS, hyperlipidemia, fibromyalgia, GERD, recurrent UTI was evaluated in office today.  Patient today appeared to be alert, oriented and pleasant.  Patient reports overall her mood symptoms as good.  She denies any hypomanic or manic symptoms.  Sleep continues to be good and she likes the Seroquel since it helps with her sleep.  Patient denies any suicidality, homicidality or perceptual disturbances.  She denies any abnormal movements around her mouth, tongue or muscle spasms or rigidity due to the medication.  She does report blurry vision, double vision which has been going on for a very long time, reports she is currently under the care of ophthalmologist.  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  She does not believe the Seroquel or any of her other medications has made it worse and is currently not interested in dose reduction of these medications since they are beneficial.  Patient continues to be under the care of her therapist and reports therapy sessions are beneficial.  Patient denies any other concerns today.  Visit Diagnosis:    ICD-10-CM   1. Bipolar disorder, in full remission, most recent episode manic ([place])  F31.74     2. GAD (generalized anxiety disorder)  F41.1     3. Primary insomnia  F51.01     4. Tardive dyskinesia  G24.01     5. High risk medication use  Z79.899 Valproic acid level    Carbamazepine level, total      Past Psychiatric History: Reviewed past psychiatric history from progress note on 06/17/2017.  [facility] - 01/11/2020.  Past Medical History:  Past Medical History:  Diagnosis Date   Anginal pain ([place])    Anxiety    Bipolar 1 disorder ([place])    Bipolar disorder ([place])    Coronary artery disease    Depression    Fatigue    Fibromyalgia    GERD (gastroesophageal reflux disease)    Heart disease    Heart failure (HCC)    HLD (hyperlipidemia)    HOH (hard of hearing)    Hypothyroidism    IBS (irritable bowel syndrome)    Pelvic pain in female    Perianal lesion    high grade sil lesion- keratinizing type   RA (rheumatoid arthritis) ([place])    Raynaud phenomenon    Sjogren's disease ([place])    Sleep apnea    Vaginal Pap smear, abnormal     Past Surgical History:  Procedure Laterality Date   CATARACT EXTRACTION W/PHACO Left 02/23/2017   Procedure: CATARACT EXTRACTION PHACO AND INTRAOCULAR LENS PLACEMENT ([place]);  Surgeon: Birder Robson, MD;  Location: [facility] ORS;  Service: Ophthalmology;  Laterality: Left;  [place] 00:35 AP% 9.6 CDE 3.47 Fluid pack lot # [id number]   CATARACT EXTRACTION W/PHACO Right 03/30/2017   Procedure: CATARACT EXTRACTION PHACO AND INTRAOCULAR LENS PLACEMENT ([facility]);  Surgeon: Birder Robson, MD;  Location: [facility] ORS;  Service: Ophthalmology;  Laterality: Right;  [place] 00:32.6 AP% 13.8 CDE 4.51 Fluid Pack Lot # [id number]   EYE SURGERY     TONSILLECTOMY  Family Psychiatric History: Reviewed family psychiatric history from progress note on 06/17/2017  Family History:  Family History  Problem Relation Age of Onset   Heart failure Father    Parkinson's disease Mother    Bipolar disorder Mother    Depression Sister    Schizophrenia Paternal Grandmother    Diabetes Maternal Grandmother    Pancreatic cancer Maternal Grandmother    Cancer Neg Hx    Heart disease Neg Hx    Breast cancer Neg Hx     Social History: Reviewed social history from progress note on 06/17/2017 Social History    Socioeconomic History   Marital status: Divorced    Spouse name: Not on file   Number of children: 2   Years of education: Not on file   Highest education level: Master's degree (e.g., MA, MS, MEng, MEd, MSW, MBA)  Occupational History    Comment: retired2  Tobacco Use   Smoking status: Never   Smokeless tobacco: Former  Scientific laboratory technician Use: Never used  Substance and Sexual Activity   Alcohol use: No    Alcohol/week: 0.0 standard drinks   Drug use: No   Sexual activity: Never    Birth control/protection: Post-menopausal  Other Topics Concern   Not on file  Social History Narrative   Not on file   Social Determinants of Health   Financial Resource Strain: Not on file  Food Insecurity: Not on file  Transportation Needs: Not on file  Physical Activity: Not on file  Stress: Not on file  Social Connections: Not on file    Allergies:  Allergies  Allergen Reactions   Amoxicillin-Pot Clavulanate Nausea Only and Other (See Comments)    Diarrhea Has patient had a PCN reaction causing immediate rash, facial/tongue/throat swelling, SOB or lightheadedness with hypotension:  Has patient had a PCN reaction causing severe rash involving mucus membranes or skin necrosis: Has patient had a PCN reaction that required hospitalization:  Has patient had a PCN reaction occurring within the last 10 years: If all of the above answers are "NO", then may proceed with Cephalosporin use.    Oxycodone-Acetaminophen Nausea Only   Erythromycin Nausea Only   Azithromycin Nausea And Vomiting   Codeine    Macrolides And Ketolides    Oxycodone-Acetaminophen    Penicillins Other (See Comments)    Has patient had a PCN reaction causing immediate rash, facial/tongue/throat swelling, SOB or lightheadedness with hypotension: NO Has patient had a PCN reaction causing severe rash involving mucus membranes or skin necrosis: NO Has patient had a PCN reaction that required hospitalization: NO Has  patient had a PCN reaction occurring within the last 10 years: NO If all of the above answers are "NO", then may proceed with Cephalosporin use.    Remeron [Mirtazapine]     Side effect-likely tardive dyskinesia   Tramadol Nausea And Vomiting   Zolpidem     Sleep walking   Lamotrigine Rash    LAMICTAL   Sulfa Antibiotics Rash    Metabolic Disorder Labs: Lab Results  Component Value Date   HGBA1C 5.3 11/22/2017   Lab Results  Component Value Date   PROLACTIN 33.0 (H) 11/22/2017   Lab Results  Component Value Date   CHOL 222 (H) 01/02/2020   TRIG 190 (H) 01/02/2020   HDL 69 01/02/2020   CHOLHDL 3.2 01/02/2020   VLDL 38 01/02/2020   LDLCALC 115 (H) 01/02/2020   LDLCALC 119 (H) 11/22/2017   Lab Results  Component Value Date  TSH 2.678 01/02/2020    Therapeutic Level Labs: No results found for: LITHIUM Lab Results  Component Value Date   VALPROATE 60 01/02/2020   VALPROATE 61 07/05/2019   No components found for:  CBMZ  Current Medications: Current Outpatient Medications  Medication Sig Dispense Refill   Acetaminophen 500 MG capsule Take 500 mg by mouth. Takes 500 mg bid prn     aspirin 81 MG chewable tablet Chew 81 mg by mouth daily with breakfast.     Calcium Citrate-Vitamin D 200-250 MG-UNIT TABS Take 1 tablet by mouth daily after breakfast.      carbamazepine (TEGRETOL XR) 200 MG 12 hr tablet Take 2 tablets (400 mg total) by mouth 2 (two) times daily. 360 tablet 1   conjugated estrogens (PREMARIN) vaginal cream Estrogen Cream Instruction Discard applicator Apply pea sized amount to tip of finger to urethra before bed. Wash hands well after application. Use Monday, Wednesday and Friday 30 g 0   divalproex (DEPAKOTE ER) 500 MG 24 hr tablet Take 1 tablet twice a day 180 tablet 1   estradiol (ESTRACE VAGINAL) 0.1 MG/GM vaginal cream Apply 0.5mg  (pea-sized amount)  just inside the vaginal introitus with a finger-tip on Monday, Wednesday and Friday nights. 42.5 g 12    fluticasone (FLONASE) 50 MCG/ACT nasal spray      folic acid (FOLVITE) 1 MG tablet Take 2 mg by mouth daily with breakfast.      gabapentin (NEURONTIN) 300 MG capsule Take 1 tablet twice a day 180 capsule 1   ibandronate (BONIVA) 150 MG tablet every 30 (thirty) days.     levothyroxine (SYNTHROID, LEVOTHROID) 75 MCG tablet Take 75 mcg by mouth daily before breakfast.      methotrexate 2.5 MG tablet Take 15 mg by mouth every Wednesday. WEDNESDAYS AT LUNCH     mirabegron ER (MYRBETRIQ) 50 MG TB24 tablet Take 1 tablet (50 mg total) by mouth daily. 30 tablet 11   Multiple Vitamins-Minerals (MULTIVITAMIN WITH MINERALS) tablet Take 1 tablet by mouth daily with breakfast.      pravastatin (PRAVACHOL) 80 MG tablet Take 1 tablet by mouth at bedtime.     QUEtiapine (SEROQUEL XR) 200 MG 24 hr tablet Take 1 tablet (200 mg total) by mouth at bedtime. STOP SEROQUEL XR 150 MG AT BEDTIME 90 tablet 0   temazepam (RESTORIL) 7.5 MG capsule TAKE 1 CAPSULE BY MOUTH EVERY NIGHT AT BEDTIME 30 capsule 2   No current facility-administered medications for this visit.     Musculoskeletal: Strength & Muscle Tone: within normal limits Gait & Station:  Walks with a cane Patient leans: N/A  Psychiatric Specialty Exam: Review of Systems  Eyes:  Positive for visual disturbance (chronic).  Skin:  Positive for color change.       Bruising noted of her rt.hand - chronic   Psychiatric/Behavioral:  Negative for agitation, behavioral problems, confusion, decreased concentration, dysphoric mood, hallucinations, self-injury, sleep disturbance and suicidal ideas. The patient is not nervous/anxious and is not hyperactive.   All other systems reviewed and are negative.  Blood pressure 131/81, pulse 84, temperature 98.1 F (36.7 C), temperature source Temporal, weight 104 lb 12.8 oz (47.5 kg).Body mass index is 20.47 kg/m.  General Appearance: Casual  Eye Contact:  Fair  Speech:  Clear and Coherent  Volume:  Normal  Mood:   Euthymic  Affect:  Congruent  Thought Process:  Goal Directed and Descriptions of Associations: Intact  Orientation:  Full (Time, Place, and Person)  Thought Content: Logical  Suicidal Thoughts:  No  Homicidal Thoughts:  No  Memory:  Immediate;   Fair Recent;   Fair Remote;   Fair  Judgement:  Fair  Insight:  Fair  Psychomotor Activity:  Normal  Concentration:  Concentration: Fair and Attention Span: Fair  Recall:  [employer] of Knowledge: Fair  Language: Fair  Akathisia:  No  Handed:  Right  AIMS (if indicated): done  Assets:  Communication Skills Desire for [place] Talents/Skills Transportation Vocational/Educational  ADL's:  Intact  Cognition: WNL  Sleep:  Fair   Screenings: [place] Office Visit from 07/17/2020 in [place] Visit from 06/14/2020 in [place] Visit from 05/30/2020 in [place] [place] Admission (Discharged) from 01/02/2020 in [facility] Visit from 11/11/2017 in [facility] Total Score 0 0 0 0 0      AUDIT    Flowsheet Row Admission (Discharged) from 01/02/2020 in [facility]  Alcohol Use Disorder Identification Test Final Score (AUDIT) 0      GAD-7    Flowsheet Row Office Visit from 07/17/2020 in [place]  Total GAD-7 Score 5      PHQ2-9    [place] Visit from 12/24/2020 in [facility] from 10/29/2020 in [facility] from 08/28/2020 in [place] from 07/17/2020 in [facility] Visit from 06/14/2020 in [facility]  PHQ-2 Total Score 0 1 0 0 1  PHQ-9 Total Score -- -- 3 5 5        [place] Office Visit from 07/17/2020 in [facility] Visit from 06/14/2020 in [facility] Visit from 05/09/2020 in [facility] No Risk Error: Q3, 4, or 5 should not be populated when Q2 is No Low Risk        Assessment and Plan: Stephanie [name] is a 2 year old Caucasian female, divorced, lives in [place], has a history of bipolar disorder, multiple medical problems was evaluated in office today.  Patient is currently stable on current medication regimen with regards to her mood and sleep.  Discussed plan as noted below.  Plan Bipolar disorder in remission Tegretol extended release 400 mg p.o. twice daily Tegretol level-01/02/2020-8.8-therapeutic Depakote extended release 500 mg p.o. twice daily Depakote level-01/02/2020-60-therapeutic. Seroquel extended release 150 mg p.o. nightly Gabapentin 300 mg p.o. twice daily  Insomnia-stable Temazepam 7.5 mg p.o. nightly as needed She uses it as needed only.  TD-chronic-resolved We will monitor closely. AIMS - 0  GAD-stable Continue CBT with [name] Gabapentin 300 mg p.o. twice daily Seroquel will also help  High risk medication use-will order Depakote level, Tegretol level.  Patient to [facility].  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  We will continue to coordinate care with Ms. Richarda Blade therapist.  Follow-up in clinic in 2 months or sooner in person.  This note was generated in part or whole with voice recognition software. Voice recognition is usually quite accurate but there are transcription errors that can and very often do occur. I apologize for any typographical errors that were not detected and corrected.     Ursula Alert, MD 12/25/2020, 2:15 PM

## 2020-12-25 NOTE — Progress Notes (Signed)
12/26/20 10:29 AM   Stephanie [name] 1945/12/30 [id number]  Referring provider:  Derinda Late, MD [phone] [address] [facility] [place],  [place] [address]  Chief Complaint  Patient presents with   Other   Urological history  1. OAB -contributing factors of age, vaginal atrophy and Seroquel -managed with Myrbetriq 50 mg daily   2. Urge incontinence -contributing factors of age, vaginal atrophy and Seroquel   3. Urethral caruncle -applying the vaginal estrogen cream three nights weekly   4. rUTI's -contributing factors of age, vaginal atrophy, incontinence, immunocompromised, family history (mother has a history of rUTI's) and IBS -documented positive urine cultures over the last year  E.coli 01/02/2020  E.coli 09/06/2020  E.coli 10/29/2020  E.coli 12/15/2020   HPI: Stephanie [name] is a 75 y.o.female who presents today for further evaluation of frequent UTIs.   She has been told by her PCP not to drive anymore due to her vision issues.  She has been tested for Myasthenia Gravis, but she has not had the results.  Her symptoms with a urinary tract infection consist of urgency, chills and suprapubic pain.    She is postmenopausal and is applying the vaginal estrogen cream three nights weekly.   She denies constipation and/or diarrhea.  She wears depends reinforced with pads.  She does not engage in good perineal hygiene.  Sh does not take tub baths.   She is drinking 32 ounces of water daily.   She does not drink sweat teas or sodas.  She is drinking cranberry juice.      PMH: Past Medical History:  Diagnosis Date   Anginal pain ([place])    Anxiety    Bipolar 1 disorder ([place])    Bipolar disorder ([place])    Coronary artery disease    Depression    Fatigue    Fibromyalgia    GERD (gastroesophageal reflux disease)    Heart disease    Heart failure ([facility])    HLD (hyperlipidemia)    HOH (hard of hearing)    Hypothyroidism    IBS  (irritable bowel syndrome)    Pelvic pain in female    Perianal lesion    high grade sil lesion- keratinizing type   RA (rheumatoid arthritis) ([place])    Raynaud phenomenon    Sjogren's disease ([place])    Sleep apnea    Vaginal Pap smear, abnormal     Surgical History: Past Surgical History:  Procedure Laterality Date   CATARACT EXTRACTION W/PHACO Left 02/23/2017   Procedure: CATARACT EXTRACTION PHACO AND INTRAOCULAR LENS PLACEMENT ([place]);  Surgeon: Birder Robson, MD;  Location: [facility] ORS;  Service: Ophthalmology;  Laterality: Left;  [place] 00:35 AP% 9.6 CDE 3.47 Fluid pack lot # [id number]   CATARACT EXTRACTION W/PHACO Right 03/30/2017   Procedure: CATARACT EXTRACTION PHACO AND INTRAOCULAR LENS PLACEMENT ([facility]);  Surgeon: Birder Robson, MD;  Location: [facility] ORS;  Service: Ophthalmology;  Laterality: Right;  [place] 00:32.6 AP% 13.8 CDE 4.51 Fluid Pack Lot # [id number]   EYE SURGERY     TONSILLECTOMY      Home Medications:  Allergies as of 12/26/2020       Reactions   Amoxicillin-pot Clavulanate Nausea Only, Other (See Comments)   Diarrhea Has patient had a PCN reaction causing immediate rash, facial/tongue/throat swelling, SOB or lightheadedness with hypotension:  Has patient had a PCN reaction causing severe rash involving mucus membranes or skin necrosis: Has patient had a PCN reaction that required hospitalization:  Has patient had a PCN reaction occurring within the last 10 years: If all of the above answers are "NO", then may proceed with Cephalosporin use.   Oxycodone-acetaminophen Nausea Only   Erythromycin Nausea Only   Azithromycin Nausea And Vomiting   Codeine    Macrolides And Ketolides    Oxycodone-acetaminophen    Penicillins Other (See Comments)   Has patient had a PCN reaction causing immediate rash, facial/tongue/throat swelling, SOB or lightheadedness with hypotension: NO Has patient had a PCN reaction causing severe rash involving mucus membranes or skin necrosis:  NO Has patient had a PCN reaction that required hospitalization: NO Has patient had a PCN reaction occurring within the last 10 years: NO If all of the above answers are "NO", then may proceed with Cephalosporin use.   Remeron [mirtazapine]    Side effect-likely tardive dyskinesia   Tramadol Nausea And Vomiting   Zolpidem    Sleep walking   Lamotrigine Rash   LAMICTAL   Sulfa Antibiotics Rash        Medication List        Accurate as of December 26, 2020 10:29 AM. If you have any questions, ask your nurse or doctor.          Acetaminophen 500 MG capsule Take 500 mg by mouth. Takes 500 mg bid prn   aspirin 81 MG chewable tablet Chew 81 mg by mouth daily with breakfast.   Calcium Citrate-Vitamin D 200-250 MG-UNIT Tabs Take 1 tablet by mouth daily after breakfast.   carbamazepine 200 MG 12 hr tablet Commonly known as: TEGRETOL XR Take 2 tablets (400 mg total) by mouth 2 (two) times daily.   divalproex 500 MG 24 hr tablet Commonly known as: DEPAKOTE ER Take 1 tablet twice a day   estradiol 0.1 MG/GM vaginal cream Commonly known as: ESTRACE VAGINAL Apply 0.69m (pea-sized amount)  just inside the vaginal introitus with a finger-tip on Monday, Wednesday and Friday nights.   fluticasone 50 MCG/ACT nasal spray Commonly known as: FLONASE   folic acid 1 MG tablet Commonly known as: FOLVITE Take 2 mg by mouth daily with breakfast.   gabapentin 300 MG capsule Commonly known as: NEURONTIN Take 1 tablet twice a day   ibandronate 150 MG tablet Commonly known as: BONIVA every 30 (thirty) days.   levothyroxine 75 MCG tablet Commonly known as: SYNTHROID Take 75 mcg by mouth daily before breakfast.   methotrexate 2.5 MG tablet Take 15 mg by mouth every Wednesday. WEDNESDAYS AT LUNCH   mirabegron ER 50 MG Tb24 tablet Commonly known as: Myrbetriq Take 1 tablet (50 mg total) by mouth daily.   multivitamin with minerals tablet Take 1 tablet by mouth daily with  breakfast.   pravastatin 80 MG tablet Commonly known as: PRAVACHOL Take 1 tablet by mouth at bedtime.   Premarin vaginal cream Generic drug: conjugated estrogens Estrogen Cream Instruction Discard applicator Apply pea sized amount to tip of finger to urethra before bed. Wash hands well after application. Use Monday, Wednesday and Friday   QUEtiapine 200 MG 24 hr tablet Commonly known as: SEROquel XR Take 1 tablet (200 mg total) by mouth at bedtime. STOP SEROQUEL XR 150 MG AT BEDTIME   temazepam 7.5 MG capsule Commonly known as: RESTORIL TAKE 1 CAPSULE BY MOUTH EVERY NIGHT AT BEDTIME        Allergies:  Allergies  Allergen Reactions   Amoxicillin-Pot Clavulanate Nausea Only and Other (See Comments)    Diarrhea Has patient had a PCN reaction  causing immediate rash, facial/tongue/throat swelling, SOB or lightheadedness with hypotension:  Has patient had a PCN reaction causing severe rash involving mucus membranes or skin necrosis: Has patient had a PCN reaction that required hospitalization:  Has patient had a PCN reaction occurring within the last 10 years: If all of the above answers are "NO", then may proceed with Cephalosporin use.    Oxycodone-Acetaminophen Nausea Only   Erythromycin Nausea Only   Azithromycin Nausea And Vomiting   Codeine    Macrolides And Ketolides    Oxycodone-Acetaminophen    Penicillins Other (See Comments)    Has patient had a PCN reaction causing immediate rash, facial/tongue/throat swelling, SOB or lightheadedness with hypotension: NO Has patient had a PCN reaction causing severe rash involving mucus membranes or skin necrosis: NO Has patient had a PCN reaction that required hospitalization: NO Has patient had a PCN reaction occurring within the last 10 years: NO If all of the above answers are "NO", then may proceed with Cephalosporin use.    Remeron [Mirtazapine]     Side effect-likely tardive dyskinesia   Tramadol Nausea And Vomiting    Zolpidem     Sleep walking   Lamotrigine Rash    LAMICTAL   Sulfa Antibiotics Rash    Family History: Family History  Problem Relation Age of Onset   Heart failure Father    Parkinson's disease Mother    Bipolar disorder Mother    Depression Sister    Schizophrenia Paternal Grandmother    Diabetes Maternal Grandmother    Pancreatic cancer Maternal Grandmother    Cancer Neg Hx    Heart disease Neg Hx    Breast cancer Neg Hx     Social History:  reports that she has never smoked. She has quit using smokeless tobacco. She reports that she does not drink alcohol and does not use drugs.   Physical Exam: BP (!) 138/92   Pulse 86   Ht 5' (1.524 m)   Wt 104 lb (47.2 kg)   BMI 20.31 kg/m   Constitutional:  Well nourished. Alert and oriented, No acute distress. HEENT: [place] AT, mask in place.  Trachea midline Cardiovascular: No clubbing, cyanosis, or edema. Respiratory: Normal respiratory effort, no increased work of breathing. Neurologic: Grossly intact, no focal deficits, moving all 4 extremities. Psychiatric: Normal mood and affect.    Laboratory Data: Lab Results  Component Value Date   CREATININE 0.41 (L) 01/02/2020    Ref Range & Units 2 mo ago  Glucose 70 - 110 mg/dL 82   Sodium 136 - 145 mmol/L 141   Potassium 3.6 - 5.1 mmol/L 4.5   Chloride 97 - 109 mmol/L 105   Carbon Dioxide (CO2) 22.0 - 32.0 mmol/L 29.7   Urea Nitrogen (BUN) 7 - 25 mg/dL 19   Creatinine 0.6 - 1.1 mg/dL 0.6   Glomerular Filtration Rate (eGFR), MDRD Estimate >60 mL/min/1.73sq m 97   Calcium 8.7 - 10.3 mg/dL 9.4   AST  8 - 39 U/L 23   ALT  5 - 38 U/L 14   Alk Phos (alkaline Phosphatase) 34 - 104 U/L 50   Albumin 3.5 - 4.8 g/dL 4.1   Bilirubin, Total 0.3 - 1.2 mg/dL 0.5   Protein, Total 6.1 - 7.9 g/dL 6.3   A/G Ratio 1.0 - 5.0 gm/dL 1.9   Resulting Agency  [facility]  Specimen Collected: 10/18/20 08:32 Last Resulted: 10/18/20 15:07  Received From: [facility]  Ref Range & Units 2 mo ago  Thyroid Stimulating Hormone (TSH) 0.450-5.330 uIU/ml uIU/mL 1.098   Comment: Reference Range for Pregnant Females >= 45 yrs old:  Normal Range for 1st trimester: 0.05-3.70 ulU/ml  Normal Range for 2nd trimester: 0.31-4.35 ulU/ml  Resulting [facility]  Specimen Collected: 10/18/20 08:32 Last Resulted: 10/18/20 15:08  Received From: [facility]  Result Received: 10/28/20 09:21  I have reviewed the labs.   Pertinent Imaging: N/A  Assessment & Plan:    1. rUTI's - criteria for recurrent UTI has been met with 2 or more infections in 6 months or 3 or greater infections in one year  - patient is instructed to increase their water intake until the urine is pale yellow or clear (10 to 12 cups daily)  - patient is instructed to take probiotics (yogurt, oral pills or vaginal suppositories), take cranberry pills, D-mannose or drink the juice and Vitamin C 1,000 mg daily to acidify the urine  - avoid soaking in tubs and wipe front to back after urinating  -obtain a RUS to rule out any surgical correctable etiology contributing to her rUTI's  2. Vaginal atrophy -continue to apply the vaginal estrogen cream three nights weekly                                           Return in about 2 weeks (around 01/09/2021) for RUS report .  [facility] [address], [place], [place] [address] [phone]

## 2020-12-26 ENCOUNTER — Ambulatory Visit: Payer: Medicare PPO | Admitting: Urology

## 2020-12-26 ENCOUNTER — Encounter: Payer: Self-pay | Admitting: Urology

## 2020-12-26 ENCOUNTER — Other Ambulatory Visit: Payer: Self-pay

## 2020-12-26 VITALS — BP 138/92 | HR 86 | Ht 60.0 in | Wt 104.0 lb

## 2020-12-26 DIAGNOSIS — N390 Urinary tract infection, site not specified: Secondary | ICD-10-CM | POA: Diagnosis not present

## 2020-12-26 DIAGNOSIS — N952 Postmenopausal atrophic vaginitis: Secondary | ICD-10-CM

## 2020-12-26 NOTE — Patient Instructions (Signed)
D-mannose

## 2020-12-30 ENCOUNTER — Other Ambulatory Visit: Payer: Self-pay

## 2020-12-30 ENCOUNTER — Emergency Department: Payer: Medicare PPO

## 2020-12-30 ENCOUNTER — Emergency Department
Admission: EM | Admit: 2020-12-30 | Discharge: 2020-12-31 | Disposition: A | Discharge status: Home / Self Care | Payer: Medicare PPO | Attending: Emergency Medicine | Admitting: Emergency Medicine

## 2020-12-30 DIAGNOSIS — S60041A Contusion of right ring finger without damage to nail, initial encounter: Secondary | ICD-10-CM | POA: Insufficient documentation

## 2020-12-30 DIAGNOSIS — I509 Heart failure, unspecified: Secondary | ICD-10-CM | POA: Insufficient documentation

## 2020-12-30 DIAGNOSIS — M25561 Pain in right knee: Secondary | ICD-10-CM | POA: Diagnosis not present

## 2020-12-30 DIAGNOSIS — Z79899 Other long term (current) drug therapy: Secondary | ICD-10-CM | POA: Diagnosis not present

## 2020-12-30 DIAGNOSIS — I110 Hypertensive heart disease with heart failure: Secondary | ICD-10-CM | POA: Diagnosis not present

## 2020-12-30 DIAGNOSIS — I2510 Atherosclerotic heart disease of native coronary artery without angina pectoris: Secondary | ICD-10-CM | POA: Diagnosis not present

## 2020-12-30 DIAGNOSIS — M545 Low back pain, unspecified: Secondary | ICD-10-CM | POA: Insufficient documentation

## 2020-12-30 DIAGNOSIS — S6991XA Unspecified injury of right wrist, hand and finger(s), initial encounter: Secondary | ICD-10-CM | POA: Diagnosis present

## 2020-12-30 DIAGNOSIS — Y92410 Unspecified street and highway as the place of occurrence of the external cause: Secondary | ICD-10-CM | POA: Diagnosis not present

## 2020-12-30 DIAGNOSIS — Z7982 Long term (current) use of aspirin: Secondary | ICD-10-CM | POA: Diagnosis not present

## 2020-12-30 DIAGNOSIS — E039 Hypothyroidism, unspecified: Secondary | ICD-10-CM | POA: Diagnosis not present

## 2020-12-30 DIAGNOSIS — Z87891 Personal history of nicotine dependence: Secondary | ICD-10-CM | POA: Insufficient documentation

## 2020-12-30 NOTE — Discharge Instructions (Signed)
You can take Tylenol for pain.
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
## 2020-12-31 NOTE — ED Notes (Signed)
Cab ride arranged by this RN for the patient. Pt was escorted to the waiting room via wheelchair to await ride - first nurse made aware of the patients presence and cab voucher.

## 2021-01-02 ENCOUNTER — Other Ambulatory Visit: Payer: Self-pay

## 2021-01-02 ENCOUNTER — Emergency Department: Payer: Medicare PPO

## 2021-01-02 ENCOUNTER — Emergency Department
Admission: EM | Admit: 2021-01-02 | Discharge: 2021-01-03 | Disposition: A | Discharge status: Home / Self Care | Payer: Medicare PPO | Attending: Emergency Medicine | Admitting: Emergency Medicine

## 2021-01-02 DIAGNOSIS — I10 Essential (primary) hypertension: Secondary | ICD-10-CM | POA: Diagnosis not present

## 2021-01-02 DIAGNOSIS — Y92410 Unspecified street and highway as the place of occurrence of the external cause: Secondary | ICD-10-CM | POA: Diagnosis not present

## 2021-01-02 DIAGNOSIS — S299XXA Unspecified injury of thorax, initial encounter: Secondary | ICD-10-CM | POA: Diagnosis present

## 2021-01-02 DIAGNOSIS — Z87891 Personal history of nicotine dependence: Secondary | ICD-10-CM | POA: Diagnosis not present

## 2021-01-02 DIAGNOSIS — R059 Cough, unspecified: Secondary | ICD-10-CM | POA: Diagnosis not present

## 2021-01-02 DIAGNOSIS — I2510 Atherosclerotic heart disease of native coronary artery without angina pectoris: Secondary | ICD-10-CM | POA: Diagnosis not present

## 2021-01-02 DIAGNOSIS — Z79899 Other long term (current) drug therapy: Secondary | ICD-10-CM | POA: Insufficient documentation

## 2021-01-02 DIAGNOSIS — R0789 Other chest pain: Secondary | ICD-10-CM

## 2021-01-02 DIAGNOSIS — Z7982 Long term (current) use of aspirin: Secondary | ICD-10-CM | POA: Diagnosis not present

## 2021-01-02 DIAGNOSIS — E039 Hypothyroidism, unspecified: Secondary | ICD-10-CM | POA: Diagnosis not present

## 2021-01-02 DIAGNOSIS — R079 Chest pain, unspecified: Secondary | ICD-10-CM

## 2021-01-02 DIAGNOSIS — Z20822 Contact with and (suspected) exposure to covid-19: Secondary | ICD-10-CM | POA: Diagnosis not present

## 2021-01-02 DIAGNOSIS — S20212A Contusion of left front wall of thorax, initial encounter: Secondary | ICD-10-CM | POA: Insufficient documentation

## 2021-01-02 LAB — TROPONIN I (HIGH SENSITIVITY)
Troponin I (High Sensitivity): 5 ng/L (ref <18)
Troponin I (High Sensitivity): 3 ng/L (ref <18)

## 2021-01-02 LAB — CBC
HCT: 39.3 % (ref 36.0–46.0)
Hemoglobin: 13.1 g/dL (ref 12.0–15.0)
MCH: 35 pg — ABNORMAL HIGH (ref 26.0–34.0)
MCHC: 33.3 g/dL (ref 30.0–36.0)
MCV: 105.1 fL — ABNORMAL HIGH (ref 80.0–100.0)
Platelets: 172 10*3/uL (ref 150–400)
RBC: 3.74 MIL/uL — ABNORMAL LOW (ref 3.87–5.11)
RDW: 13.6 % (ref 11.5–15.5)
WBC: 7 10*3/uL (ref 4.0–10.5)
nRBC: 0 % (ref 0.0–0.2)

## 2021-01-02 LAB — BASIC METABOLIC PANEL
Anion gap: 6 (ref 5–15)
BUN: 20 mg/dL (ref 8–23)
CO2: 30 mmol/L (ref 22–32)
Calcium: 8.9 mg/dL (ref 8.9–10.3)
Chloride: 103 mmol/L (ref 98–111)
Creatinine, Ser: 0.63 mg/dL (ref 0.44–1.00)
GFR, Estimated: >60 mL/min (ref >60)
Glucose, Bld: 114 mg/dL — ABNORMAL HIGH (ref 70–99)
Potassium: 4.1 mmol/L (ref 3.5–5.1)
Sodium: 139 mmol/L (ref 135–145)
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
## 2021-01-02 NOTE — ED Notes (Signed)
Pt taken to restroom via w/c; able to ambulate in BR without difficulty; pt updated on wait time; chart reviewed; vs retaken and repeat troponin performed; pt sat 99-100% currently on ra

## 2021-01-02 NOTE — ED Notes (Addendum)
Pt on 4L [place] at 95% in NAD. First RN aware pt now on [place].

## 2021-01-02 NOTE — ED Provider Notes (Signed)
Emergency Medicine Provider Triage Evaluation Note  Stephanie [name], a 75 y.o. female  was evaluated in triage.  Pt complains of left-sided chest pain with onset about 6 hours prior to arrival.  Patient took 2 nitro rule out relief of her symptoms.  Patient presents via EMS.  Review of Systems  Positive: CP, SOB Negative: FCS  Physical Exam  BP 134/82 (BP Location: Right Arm)   Pulse 78   Temp 98.2 F (36.8 C) (Oral)   Resp 19   Ht 5' (1.524 m)   Wt 47.2 kg   SpO2 (!) 89%   BMI 20.31 kg/m  Gen:   Awake, no distress  NAD Resp:  Normal effort CTA MSK:   Moves extremities without difficulty  Other:  CVS: RRR  Medical Decision Making  Medically screening exam initiated at 4:23 PM.  Appropriate orders placed.  Stephanie [name] was informed that the remainder of the evaluation will be completed by another provider, this initial triage assessment does not replace that evaluation, and the importance of remaining in the ED until their evaluation is complete.  Geriatric patient with ED evaluation of left-sided chest pain with new oxygen requirement.   Stephanie Needles, PA-C 01/02/21 1625    Stephanie Pear, MD 01/02/21 1630
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
## 2021-01-03 ENCOUNTER — Emergency Department: Payer: Medicare PPO

## 2021-01-03 LAB — RESP PANEL BY RT-PCR (FLU A&B, COVID) ARPGX2
Influenza A by PCR: NEGATIVE
Influenza B by PCR: NEGATIVE
SARS Coronavirus 2 by RT PCR: NEGATIVE

## 2021-01-03 LAB — MISC LABCORP TEST (SEND OUT): Labcorp test code: 165605

## 2021-01-03 MED ORDER — IOHEXOL 350 MG/ML SOLN
75.0000 mL | Freq: Once | INTRAVENOUS | Status: AC | PRN
  Administered 2021-01-03: 75 mL via INTRAVENOUS
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
## 2021-01-03 NOTE — Discharge Instructions (Addendum)
Take Tylenol as needed for pain.  Apply ice or heat to the affected area, whichever makes you feel better.  Use incentive spirometer as instructed.  Return to the ER for worsening symptoms, persistent vomiting, difficulty breathing or other concerns.

## 2021-01-03 NOTE — ED Notes (Signed)
Patient returned from [place]. IV site did not work and they attempted IV x2 without success.

## 2021-01-03 NOTE — ED Notes (Signed)
Pt ambulated in hallway, oxygen sats 99-100% on room air

## 2021-01-11 ENCOUNTER — Other Ambulatory Visit: Payer: Self-pay | Admitting: Urology

## 2021-01-14 ENCOUNTER — Telehealth: Payer: Self-pay
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
## 2021-01-14 NOTE — Telephone Encounter (Signed)
Sorry to hear that, thank you for letting me know

## 2021-01-16 ENCOUNTER — Ambulatory Visit: Payer: Medicare PPO | Admitting: Urology

## 2021-01-23 ENCOUNTER — Other Ambulatory Visit: Payer: Self-pay

## 2021-01-23 ENCOUNTER — Ambulatory Visit
Admission: RE | Admit: 2021-01-23 | Discharge: 2021-01-23 | Disposition: A | Discharge status: Home / Self Care | Payer: Medicare PPO | Source: Ambulatory Visit | Attending: Urology | Admitting: Urology

## 2021-01-23 DIAGNOSIS — N390 Urinary tract infection, site not specified: Secondary | ICD-10-CM | POA: Insufficient documentation

## 2021-01-24 LAB — VALPROIC ACID LEVEL: Valproic Acid Lvl: 60 ug/mL (ref 50–100)

## 2021-01-24 LAB — CARBAMAZEPINE LEVEL, TOTAL: Carbamazepine (Tegretol), S: 7.3 ug/mL (ref 4.0–12.0)

## 2021-01-28 NOTE — Progress Notes (Signed)
01/29/21 3:30 PM   Stephanie [name] [birth year] [id number]  Referring provider:  Derinda Late, MD [phone] [address] [facility] [place],   [address]  Chief Complaint  Patient presents with   Results    Urological history  1. OAB -contributing factors of age, vaginal atrophy and Seroquel -managed with Myrbetriq 50 mg daily   2. Urge incontinence -contributing factors of age, vaginal atrophy and Seroquel   3. Urethral caruncle -applying the vaginal estrogen cream three nights weekly   4. rUTI's -contributing factors of age, vaginal atrophy, incontinence, immunocompromised, family history (mother has a history of rUTI's) and IBS -documented positive urine cultures over the last year  E.coli 01/02/2020  E.coli 09/06/2020  E.coli 10/29/2020  E.coli 12/15/2020   HPI: Stephanie [name] is a 75 y.o.female who presents today for RUS report.  RUS was ordered for further investigation into her rUTI's.  RUS 01/2021 - There is no hydronephrosis or perinephric fluid collection. There is slightly increased cortical echogenicity which may be normal variation due to technique or suggest medical renal disease.  UA in negative.   She has no urinary complaints at this visit.  Patient denies any modifying or aggravating factors.  Patient denies any gross hematuria, dysuria or suprapubic/flank pain.  Patient denies any fevers, chills, nausea or vomiting.    She is satisfied with the Myrbetriq 50 mg.  She is changing her pads twice daily.  She states the pads have a little reddish look.    She is using the vaginal estrogen cream and cranberry tablets.     PMH: Past Medical History:  Diagnosis Date   Anginal pain ([place])    Anxiety    Bipolar 1 disorder ([place])    Bipolar disorder ([place])    Coronary artery disease    Depression    Fatigue    Fibromyalgia    GERD (gastroesophageal reflux disease)    Heart disease    Heart failure (H[facility])    HLD  (hyperlipidemia)    HOH (hard of hearing)    Hypothyroidism    IBS (irritable bowel syndrome)    Pelvic pain in female    Perianal lesion    high grade sil lesion- keratinizing type   RA (rheumatoid arthritis) ([place])    Raynaud phenomenon    Sjogren's disease ([place])    Sleep apnea    Vaginal Pap smear, abnormal     Surgical History: Past Surgical History:  Procedure Laterality Date   CATARACT EXTRACTION W/PHACO Left 02/23/2017   Procedure: CATARACT EXTRACTION PHACO AND INTRAOCULAR LENS PLACEMENT ([place]);  Surgeon: Birder Robson, MD;  Location: [facility] ORS;  Service: Ophthalmology;  Laterality: Left;  [place] 00:35 AP% 9.6 CDE 3.47 Fluid pack lot # [id number]   CATARACT EXTRACTION W/PHACO Right 03/30/2017   Procedure: CATARACT EXTRACTION PHACO AND INTRAOCULAR LENS PLACEMENT ([facility]);  Surgeon: Birder Robson, MD;  Location: [facility] ORS;  Service: Ophthalmology;  Laterality: Right;  [place] 00:32.6 AP% 13.8 CDE 4.51 Fluid Pack Lot # [id number]   EYE SURGERY     TONSILLECTOMY      Home Medications:  Allergies as of 01/29/2021       Reactions   Amoxicillin-pot Clavulanate Nausea Only, Other (See Comments)   Diarrhea Has patient had a PCN reaction causing immediate rash, facial/tongue/throat swelling, SOB or lightheadedness with hypotension:  Has patient had a PCN reaction causing severe rash involving mucus membranes or skin necrosis: Has patient had a PCN reaction that required hospitalization:  Has patient had a PCN reaction occurring within the last 10 years: If all of the above answers are "NO", then may proceed with Cephalosporin use.   Oxycodone-acetaminophen Nausea Only   Erythromycin Nausea Only   Azithromycin Nausea And Vomiting   Codeine    Macrolides And Ketolides    Oxycodone-acetaminophen    Penicillins Other (See Comments)   Has patient had a PCN reaction causing immediate rash, facial/tongue/throat swelling, SOB or lightheadedness with hypotension: NO Has patient had a PCN  reaction causing severe rash involving mucus membranes or skin necrosis: NO Has patient had a PCN reaction that required hospitalization: NO Has patient had a PCN reaction occurring within the last 10 years: NO If all of the above answers are "NO", then may proceed with Cephalosporin use.   Remeron [mirtazapine]    Side effect-likely tardive dyskinesia   Tramadol Nausea And Vomiting   Zolpidem    Sleep walking   Lamotrigine Rash   LAMICTAL   Sulfa Antibiotics Rash        Medication List        Accurate as of January 29, 2021  3:30 PM. If you have any questions, ask your nurse or doctor.          Acetaminophen 500 MG capsule Take 500 mg by mouth. Takes 500 mg bid prn   aspirin 81 MG chewable tablet Chew 81 mg by mouth daily with breakfast.   Calcium Citrate-Vitamin D 200-250 MG-UNIT Tabs Take 1 tablet by mouth daily after breakfast.   carbamazepine 200 MG 12 hr tablet Commonly known as: TEGRETOL XR Take 2 tablets (400 mg total) by mouth 2 (two) times daily.   divalproex 500 MG 24 hr tablet Commonly known as: DEPAKOTE ER Take 1 tablet twice a day   estradiol 0.1 MG/GM vaginal cream Commonly known as: ESTRACE VAGINAL Apply 0.48m (pea-sized amount)  just inside the vaginal introitus with a finger-tip on Monday, Wednesday and Friday nights.   fluticasone 50 MCG/ACT nasal spray Commonly known as: FLONASE   folic acid 1 MG tablet Commonly known as: FOLVITE Take 2 mg by mouth daily with breakfast.   gabapentin 300 MG capsule Commonly known as: NEURONTIN Take 1 tablet twice a day   ibandronate 150 MG tablet Commonly known as: BONIVA every 30 (thirty) days.   levothyroxine 75 MCG tablet Commonly known as: SYNTHROID Take 75 mcg by mouth daily before breakfast.   methotrexate 2.5 MG tablet Take 15 mg by mouth every Wednesday. WEDNESDAYS AT LUNCH   multivitamin with minerals tablet Take 1 tablet by mouth daily with breakfast.   Myrbetriq 50 MG Tb24  tablet Generic drug: mirabegron ER TAKE 1 TABLET BY MOUTH ONCE A DAY   pravastatin 80 MG tablet Commonly known as: PRAVACHOL Take 1 tablet by mouth at bedtime.   Premarin vaginal cream Generic drug: conjugated estrogens Estrogen Cream Instruction Discard applicator Apply pea sized amount to tip of finger to urethra before bed. Wash hands well after application. Use Monday, Wednesday and Friday   QUEtiapine 200 MG 24 hr tablet Commonly known as: SEROquel XR Take 1 tablet (200 mg total) by mouth at bedtime. STOP SEROQUEL XR 150 MG AT BEDTIME   temazepam 7.5 MG capsule Commonly known as: RESTORIL TAKE 1 CAPSULE BY MOUTH EVERY NIGHT AT BEDTIME        Allergies:  Allergies  Allergen Reactions   Amoxicillin-Pot Clavulanate Nausea Only and Other (See Comments)    Diarrhea Has patient had a PCN reaction causing  immediate rash, facial/tongue/throat swelling, SOB or lightheadedness with hypotension:  Has patient had a PCN reaction causing severe rash involving mucus membranes or skin necrosis: Has patient had a PCN reaction that required hospitalization:  Has patient had a PCN reaction occurring within the last 10 years: If all of the above answers are "NO", then may proceed with Cephalosporin use.    Oxycodone-Acetaminophen Nausea Only   Erythromycin Nausea Only   Azithromycin Nausea And Vomiting   Codeine    Macrolides And Ketolides    Oxycodone-Acetaminophen    Penicillins Other (See Comments)    Has patient had a PCN reaction causing immediate rash, facial/tongue/throat swelling, SOB or lightheadedness with hypotension: NO Has patient had a PCN reaction causing severe rash involving mucus membranes or skin necrosis: NO Has patient had a PCN reaction that required hospitalization: NO Has patient had a PCN reaction occurring within the last 10 years: NO If all of the above answers are "NO", then may proceed with Cephalosporin use.    Remeron [Mirtazapine]     Side  effect-likely tardive dyskinesia   Tramadol Nausea And Vomiting   Zolpidem     Sleep walking   Lamotrigine Rash    LAMICTAL   Sulfa Antibiotics Rash    Family History: Family History  Problem Relation Age of Onset   Heart failure Father    Parkinson's disease Mother    Bipolar disorder Mother    Depression Sister    Schizophrenia Paternal Grandmother    Diabetes Maternal Grandmother    Pancreatic cancer Maternal Grandmother    Cancer Neg Hx    Heart disease Neg Hx    Breast cancer Neg Hx     Social History:  reports that she has never smoked. She has quit using smokeless tobacco. She reports that she does not drink alcohol and does not use drugs.   Physical Exam: BP 134/79   Pulse 75   Ht 5' (1.524 m)   Wt 104 lb (47.2 kg)   BMI 20.31 kg/m   Constitutional:  Well nourished. Alert and oriented, No acute distress. HEENT:  AT, mask in place.  Trachea midline Cardiovascular: No clubbing, cyanosis, or edema. Respiratory: Normal respiratory effort, no increased work of breathing. Neurologic: Grossly intact, no focal deficits, moving all 4 extremities. Psychiatric: Normal mood and affect.    Laboratory Data: Urinalysis Component     Latest Ref Rng & Units 01/29/2021  Specific Gravity, UA     1.005 - 1.030 1.010  pH, UA     5.0 - 7.5 7.5  Color, UA     Yellow Straw  Appearance Ur     Clear Clear  Leukocytes,UA     Negative Negative  Protein,UA     Negative/Trace Negative  Glucose, UA     Negative Negative  Ketones, UA     Negative Negative  RBC, UA     Negative Negative  Bilirubin, UA     Negative Negative  Urobilinogen, Ur     0.2 - 1.0 mg/dL 0.2  Nitrite, UA     Negative Negative  Microscopic Examination      See below:   Component     Latest Ref Rng & Units 01/29/2021  WBC, UA     0 - 5 /hpf 0-5  RBC     0 - 2 /hpf 0-2  Epithelial Cells (non renal)     0 - 10 /hpf 0-10  Bacteria, UA     None seen/Few None seen  Component     Latest Ref  Rng & Units 01/02/2021  WBC     4.0 - 10.5 K/uL 7.0  RBC     3.87 - 5.11 MIL/uL 3.74 (L)  Hemoglobin     12.0 - 15.0 g/dL 13.1  HCT     36.0 - 46.0 % 39.3  MCV     80.0 - 100.0 fL 105.1 (H)  MCH     26.0 - 34.0 pg 35.0 (H)  MCHC     30.0 - 36.0 g/dL 33.3  RDW     11.5 - 15.5 % 13.6  Platelets     150 - 400 K/uL 172  Neutrophils     Not Estab. %   Lymphs     Not Estab. %   Monocytes     Not Estab. %   Eos     Not Estab. %   Basos     Not Estab. %   NEUT#     1.4 - 7.0 x10E3/uL   Lymphocyte #     0.7 - 3.1 x10E3/uL   Monocytes Absolute     0.1 - 0.9 x10E3/uL   EOS (ABSOLUTE)     0.0 - 0.4 x10E3/uL   Basophils Absolute     0.0 - 0.2 x10E3/uL   Immature Granulocytes     Not Estab. %   Immature Grans (Abs)     0.0 - 0.1 x10E3/uL   nRBC     0.0 - 0.2 % 0.0  WBC, UA     0 - 5 /hpf   Epithelial Cells (non renal)     0 - 10 /hpf   Crystals     N/A   Crystal Type     N/A   Bacteria, UA     None seen/Few    Component     Latest Ref Rng & Units 01/02/2021  Sodium     135 - 145 mmol/L 139  Potassium     3.5 - 5.1 mmol/L 4.1  Chloride     98 - 111 mmol/L 103  CO2     22 - 32 mmol/L 30  Glucose     70 - 99 mg/dL 114 (H)  BUN     8 - 23 mg/dL 20  Creatinine     0.44 - 1.00 mg/dL 0.63  Calcium     8.9 - 10.3 mg/dL 8.9  GFR, Estimated     >60 mL/min >60  Anion gap     5 - 15 6  I have reviewed the labs.   Pertinent Imaging: CLINICAL DATA:  Recurrent UTI 07/20/2008   EXAM: RENAL / URINARY TRACT ULTRASOUND COMPLETE   COMPARISON:  07/20/2008   FINDINGS: Right Kidney:   Renal measurements: 9.9 x 5.7 x 5.6 cm = volume: 163 mL. There is slightly increased cortical echogenicity. There is no hydronephrosis.   Left Kidney:   Renal measurements: 9.5 x 4.4 x 4.7 cm = volume: 103 mL. There is increased cortical echogenicity. There is no hydronephrosis.   Bladder:   Appears normal for degree of bladder distention.   Other:   None.    IMPRESSION: There is no hydronephrosis or perinephric fluid collection. There is slightly increased cortical echogenicity which may be normal variation due to technique or suggest medical renal disease.     Electronically Signed   By: Elmer Picker M.D.   On: 01/24/2021 14:14 I have independently reviewed the films.  See HPI.  Assessment & Plan:    1. rUTI's - criteria for recurrent UTI has been met with 2 or more infections in 6 months or 3 or greater infections in one year  - patient is instructed to increase their water intake until the urine is pale yellow or clear (10 to 12 cups daily)  - patient is instructed to take probiotics (yogurt, oral pills or vaginal suppositories), take cranberry pills, D-mannose or drink the juice and Vitamin C 1,000 mg daily to acidify the urine  - avoid soaking in tubs and wipe front to back after urinating  -RUS WNL  2. Incontinence  -at goal with Myrbetriq 50 mg daily   3. Vaginal atrophy -continue to apply the vaginal estrogen cream three nights weekly     4. Gross hematuria -seeing reddish look to her incontinence pads, denies gross heme -she has a caruncle so the blood may be coming from the irritation of the caruncles by the pads, but will schedule cysto to be thorough  -I have explained to the patient that they will  be scheduled for a cystoscopy in our office to evaluate their bladder.  The cystoscopy consists of passing a tube with a lens up through their urethra and into their urinary bladder.   We will inject the urethra with a lidocaine gel prior to introducing the cystoscope to help with any discomfort during the procedure.   After the procedure, they might experience blood in the urine and discomfort with urination.  This will abate after the first few voids.  I have  encouraged the patient to increase water intake  during this time.  Patient denies any allergies to lidocaine.                                  Return for  cystoscopy for reddish look on her incontinence pads .  [facility] [address] [place], [place] [address] [phone]

## 2021-01-29 ENCOUNTER — Other Ambulatory Visit: Payer: Self-pay | Admitting: Psychiatry

## 2021-01-29 ENCOUNTER — Ambulatory Visit: Payer: Medicare PPO | Admitting: Urology

## 2021-01-29 ENCOUNTER — Telehealth: Payer: Self-pay | Admitting: Psychiatry

## 2021-01-29 ENCOUNTER — Encounter: Payer: Self-pay | Admitting: Urology

## 2021-01-29 ENCOUNTER — Other Ambulatory Visit: Payer: Self-pay

## 2021-01-29 VITALS — BP 134/79 | HR 75 | Ht 60.0 in | Wt 104.0 lb

## 2021-01-29 DIAGNOSIS — F411 Generalized anxiety disorder: Secondary | ICD-10-CM

## 2021-01-29 DIAGNOSIS — R310 Gross hematuria: Secondary | ICD-10-CM | POA: Diagnosis not present

## 2021-01-29 DIAGNOSIS — F5101 Primary insomnia: Secondary | ICD-10-CM

## 2021-01-29 DIAGNOSIS — N390 Urinary tract infection, site not specified: Secondary | ICD-10-CM | POA: Diagnosis not present

## 2021-01-29 DIAGNOSIS — N3941 Urge incontinence: Secondary | ICD-10-CM | POA: Diagnosis not present

## 2021-01-29 DIAGNOSIS — N952 Postmenopausal atrophic vaginitis: Secondary | ICD-10-CM | POA: Diagnosis not present

## 2021-01-29 DIAGNOSIS — F3174 Bipolar disorder, in full remission, most recent episode manic: Secondary | ICD-10-CM

## 2021-01-29 LAB — URINALYSIS, COMPLETE
Bilirubin, UA: NEGATIVE
Glucose, UA: NEGATIVE
Ketones, UA: NEGATIVE
Leukocytes,UA: NEGATIVE
Nitrite, UA: NEGATIVE
Protein,UA: NEGATIVE
RBC, UA: NEGATIVE
Specific Gravity, UA: 1.01 (ref 1.005–1.030)
Urobilinogen, Ur: 0.2 mg/dL (ref 0.2–1.0)
pH, UA: 7.5 (ref 5.0–7.5)

## 2021-01-29 LAB — MICROSCOPIC EXAMINATION: Bacteria, UA: NONE SEEN

## 2021-01-29 MED ORDER — QUETIAPINE FUMARATE ER 200 MG PO TB24: 200.0000 mg | ORAL_TABLET | Freq: Every day | ORAL | 0 refills | Status: DC

## 2021-01-29 NOTE — Telephone Encounter (Signed)
Received message from patient's provider to secure chat [name] -the patient concerned a long weekend this coming and she may need a Seroquel refill soon.  I sent Seroquel to pharmacy.  I have communicated back with her provider that it has been sent.

## 2021-01-29 NOTE — Patient Instructions (Signed)
D-mannose tablet

## 2021-02-06 ENCOUNTER — Encounter: Payer: Self-pay | Admitting: *Deleted

## 2021-02-06 ENCOUNTER — Telehealth: Payer: Self-pay | Admitting: Urology

## 2021-02-06 NOTE — Telephone Encounter (Signed)
Sent information about the cysto to pt's MyChart.

## 2021-02-06 NOTE — Telephone Encounter (Signed)
Pt would like to know more information about a cysto.  She has cancelled twice.  She said she wanted to know more about it

## 2021-02-19 ENCOUNTER — Ambulatory Visit: Payer: Medicare PPO | Admitting: Psychiatry

## 2021-02-19 ENCOUNTER — Other Ambulatory Visit: Payer: Medicare PPO | Admitting: Urology

## 2021-02-20 ENCOUNTER — Other Ambulatory Visit: Payer: Medicare PPO | Admitting: Urology

## 2021-03-07 ENCOUNTER — Telehealth: Payer: Self-pay

## 2021-03-07 NOTE — Telephone Encounter (Signed)
Medication management - Telephone call with patient after she left a message requesting a call back. Pt. stated she has had a sore throat for a few days and was not sure if she will be able to keep her appt 03/11/21. Patient stated she continues to have some blurred and double vision at times, which her pharmacist stated may be due to her Seroquel, so patient is going to try to keep the appointment on Tuesday 03/11/21 to discuss with Dr. Shea Evans.  Patient agreed to call back first thing that morning if she will be unable to keep the appointment.  Agreed to send Dr. Shea Evans a message about patient's concerns, he plan to keep appt 03/11/21, but also that she has not been feeling great recently and may end up having to cancel and reschedule if not feeling better by that date, 03/11/21.

## 2021-03-07 NOTE — Telephone Encounter (Signed)
Noted , thank you. Will evaluate patient at her next visit or she could call back as needed.

## 2021-03-11 ENCOUNTER — Encounter: Payer: Self-pay | Admitting: Psychiatry

## 2021-03-11 ENCOUNTER — Ambulatory Visit (INDEPENDENT_AMBULATORY_CARE_PROVIDER_SITE_OTHER): Payer: Medicare PPO | Admitting: Psychiatry

## 2021-03-11 ENCOUNTER — Other Ambulatory Visit: Payer: Self-pay

## 2021-03-11 VITALS — BP 150/81 | HR 89 | Temp 97.5°F | Wt 101.0 lb

## 2021-03-11 DIAGNOSIS — F5101 Primary insomnia: Secondary | ICD-10-CM | POA: Diagnosis not present

## 2021-03-11 DIAGNOSIS — G2401 Drug induced subacute dyskinesia: Secondary | ICD-10-CM | POA: Diagnosis not present

## 2021-03-11 DIAGNOSIS — R030 Elevated blood-pressure reading, without diagnosis of hypertension: Secondary | ICD-10-CM

## 2021-03-11 DIAGNOSIS — F319 Bipolar disorder, unspecified: Secondary | ICD-10-CM | POA: Diagnosis not present

## 2021-03-11 DIAGNOSIS — F411 Generalized anxiety disorder: Secondary | ICD-10-CM

## 2021-03-11 DIAGNOSIS — Z79899 Other long term (current) drug therapy: Secondary | ICD-10-CM

## 2021-03-11 NOTE — Progress Notes (Signed)
[facility] MD OP Progress Note  03/11/2021 2:40 PM ANASTASIA TOMPSON  MRN:  [id number]  Chief Complaint:  Chief Complaint   Follow-up; Depression; Anxiety; Insomnia    HPI: Stephanie [name] is a 76 year old Caucasian female, divorced, lives in [place], has a history of bipolar disorder, GAD, primary insomnia, tardive dyskinesia, Sjogren's syndrome, Raynaud's phenomenon, hypothyroidism, coronary artery disease, IBS, hyperlipidemia, fibromyalgia, GERD, recurrent UTI was evaluated in office today.  Patient today reports overall she has noticed mild depressive symptoms like feeling down, social withdrawal, concentration problems in the past couple of weeks.  She reports this could also be because of her multiple medical problems.  She reports she currently probably has a UTI since she has been having some symptoms of burning.  She reports she is planning to talk to her primary care provider.  Patient also reports double vision which has been getting worse since the past several months.  She does have an appointment with the ophthalmologist on January 10.  Because of all her medical problems she has not been driving.  She reports that she is not driving she has trouble getting around and has to depend on others.  This does affect her mood and her ability to be active at her church and so on.  She however reports she has good support system from her children.  She is planning to probably go into a senior living community however it is a long process and she does not know if she could afford it or not.  That does worry her.  Patient reports sleep is overall okay.  Currently takes only the Seroquel.  Has been holding of the temazepam.  Has upcoming appointment with her therapist [name] and therapy has been beneficial.  Denies suicidality, homicidality or perceptual disturbances.  Appears to be alert, oriented to person place time and situation.  Visit Diagnosis:    ICD-10-CM   1. Bipolar 1 disorder,  depressed ([place])  F31.9    likely mild , unspecified    2. GAD (generalized anxiety disorder)  F41.1     3. Primary insomnia  F51.01     4. Tardive dyskinesia  G24.01     5. High risk medication use  Z79.899     6. Elevated blood pressure reading  R03.0       Past Psychiatric History: Reviewed past psychiatric history from progress note on 06/17/2017.  Inpatient mental health admission at [facility] 01/02/2020 - 01/11/2020.  Past Medical History:  Past Medical History:  Diagnosis Date   Anginal pain ([place])    Anxiety    Bipolar 1 disorder ([place])    Bipolar disorder ([place])    Coronary artery disease    Depression    Fatigue    Fibromyalgia    GERD (gastroesophageal reflux disease)    Heart disease    Heart failure (HCC)    HLD (hyperlipidemia)    HOH (hard of hearing)    Hypothyroidism    IBS (irritable bowel syndrome)    Pelvic pain in female    Perianal lesion    high grade sil lesion- keratinizing type   RA (rheumatoid arthritis) ([place])    Raynaud phenomenon    Sjogren's disease ([place])    Sleep apnea    Vaginal Pap smear, abnormal     Past Surgical History:  Procedure Laterality Date   CATARACT EXTRACTION W/PHACO Left 02/23/2017   Procedure: CATARACT EXTRACTION PHACO AND INTRAOCULAR LENS PLACEMENT ([place]);  Surgeon: George Ina,  Gwyndolyn Saxon, MD;  Location: [facility] ORS;  Service: Ophthalmology;  Laterality: Left;  [place] 00:35 AP% 9.6 CDE 3.47 Fluid pack lot # [id number]   CATARACT EXTRACTION W/PHACO Right 03/30/2017   Procedure: CATARACT EXTRACTION PHACO AND INTRAOCULAR LENS PLACEMENT ([facility]);  Surgeon: Birder Robson, MD;  Location: [facility] ORS;  Service: Ophthalmology;  Laterality: Right;  [place] 00:32.6 AP% 13.8 CDE 4.51 Fluid Pack Lot # [id number]   EYE SURGERY     TONSILLECTOMY      Family Psychiatric History: Reviewed family psychiatric history from progress note on 06/17/2017.  Family History:  Family History  Problem Relation Age of Onset   Heart failure  Father    Parkinson's disease Mother    Bipolar disorder Mother    Depression Sister    Schizophrenia Paternal Grandmother    Diabetes Maternal Grandmother    Pancreatic cancer Maternal Grandmother    Cancer Neg Hx    Heart disease Neg Hx    Breast cancer Neg Hx     Social History: Reviewed social history from progress note on 06/17/2017. Social History   Socioeconomic History   Marital status: Divorced    Spouse name: Not on file   Number of children: 2   Years of education: Not on file   Highest education level: Master's degree (e.g., MA, MS, MEng, MEd, MSW, MBA)  Occupational History    Comment: retired2  Tobacco Use   Smoking status: Never   Smokeless tobacco: Former  Scientific laboratory technician Use: Never used  Substance and Sexual Activity   Alcohol use: No    Alcohol/week: 0.0 standard drinks   Drug use: No   Sexual activity: Never    Birth control/protection: Post-menopausal  Other Topics Concern   Not on file  Social History Narrative   Not on file   Social Determinants of Health   Financial Resource Strain: Not on file  Food Insecurity: Not on file  Transportation Needs: Not on file  Physical Activity: Not on file  Stress: Not on file  Social Connections: Not on file    Allergies:  Allergies  Allergen Reactions   Amoxicillin-Pot Clavulanate Nausea Only and Other (See Comments)    Diarrhea Has patient had a PCN reaction causing immediate rash, facial/tongue/throat swelling, SOB or lightheadedness with hypotension:  Has patient had a PCN reaction causing severe rash involving mucus membranes or skin necrosis: Has patient had a PCN reaction that required hospitalization:  Has patient had a PCN reaction occurring within the last 10 years: If all of the above answers are "NO", then may proceed with Cephalosporin use.    Oxycodone-Acetaminophen Nausea Only   Erythromycin Nausea Only   Azithromycin Nausea And Vomiting   Codeine    Macrolides And Ketolides     Oxycodone-Acetaminophen    Penicillins Other (See Comments)    Has patient had a PCN reaction causing immediate rash, facial/tongue/throat swelling, SOB or lightheadedness with hypotension: NO Has patient had a PCN reaction causing severe rash involving mucus membranes or skin necrosis: NO Has patient had a PCN reaction that required hospitalization: NO Has patient had a PCN reaction occurring within the last 10 years: NO If all of the above answers are "NO", then may proceed with Cephalosporin use.    Remeron [Mirtazapine]     Side effect-likely tardive dyskinesia   Tramadol Nausea And Vomiting   Zolpidem     Sleep walking   Lamotrigine Rash    LAMICTAL   Sulfa Antibiotics Rash  Metabolic Disorder Labs: Lab Results  Component Value Date   HGBA1C 5.3 11/22/2017   Lab Results  Component Value Date   PROLACTIN 33.0 (H) 11/22/2017   Lab Results  Component Value Date   CHOL 222 (H) 01/02/2020   TRIG 190 (H) 01/02/2020   HDL 69 01/02/2020   CHOLHDL 3.2 01/02/2020   VLDL 38 01/02/2020   LDLCALC 115 (H) 01/02/2020   LDLCALC 119 (H) 11/22/2017   Lab Results  Component Value Date   TSH 2.678 01/02/2020    Therapeutic Level Labs: No results found for: LITHIUM Lab Results  Component Value Date   VALPROATE 60 01/22/2021   VALPROATE 60 01/02/2020   No components found for:  CBMZ  Current Medications: Current Outpatient Medications  Medication Sig Dispense Refill   Acetaminophen 500 MG capsule Take 500 mg by mouth. Takes 500 mg bid prn     aspirin 81 MG chewable tablet Chew 81 mg by mouth daily with breakfast.     Calcium Citrate-Vitamin D 200-250 MG-UNIT TABS Take 1 tablet by mouth daily after breakfast.      carbamazepine (TEGRETOL XR) 200 MG 12 hr tablet Take 2 tablets (400 mg total) by mouth 2 (two) times daily. 360 tablet 1   conjugated estrogens (PREMARIN) vaginal cream Estrogen Cream Instruction Discard applicator Apply pea sized amount to tip of finger to urethra  before bed. Wash hands well after application. Use Monday, Wednesday and Friday 30 g 0   divalproex (DEPAKOTE ER) 500 MG 24 hr tablet Take 1 tablet twice a day 180 tablet 1   estradiol (ESTRACE VAGINAL) 0.1 MG/GM vaginal cream Apply 0.5mg  (pea-sized amount)  just inside the vaginal introitus with a finger-tip on Monday, Wednesday and Friday nights. 42.5 g 12   fluticasone (FLONASE) 50 MCG/ACT nasal spray      folic acid (FOLVITE) 1 MG tablet Take 2 mg by mouth daily with breakfast.      gabapentin (NEURONTIN) 300 MG capsule Take 1 tablet twice a day 180 capsule 1   ibandronate (BONIVA) 150 MG tablet every 30 (thirty) days.     levothyroxine (SYNTHROID, LEVOTHROID) 75 MCG tablet Take 75 mcg by mouth daily before breakfast.      methotrexate 2.5 MG tablet Take 15 mg by mouth every Wednesday. WEDNESDAYS AT LUNCH     Multiple Vitamins-Minerals (MULTIVITAMIN WITH MINERALS) tablet Take 1 tablet by mouth daily with breakfast.      MYRBETRIQ 50 MG TB24 tablet TAKE 1 TABLET BY MOUTH ONCE A DAY 30 tablet 11   pravastatin (PRAVACHOL) 80 MG tablet Take 1 tablet by mouth at bedtime.     predniSONE (DELTASONE) 10 MG tablet Take 1 tablet by mouth daily.     QUEtiapine (SEROQUEL XR) 200 MG 24 hr tablet Take 1 tablet (200 mg total) by mouth at bedtime. STOP SEROQUEL XR 150 MG AT BEDTIME 90 tablet 0   temazepam (RESTORIL) 7.5 MG capsule TAKE 1 CAPSULE BY MOUTH EVERY NIGHT AT BEDTIME 30 capsule 2   No current facility-administered medications for this visit.     Musculoskeletal: Strength & Muscle Tone: within normal limits Gait & Station:  walks with a cane Patient leans: N/A  Psychiatric Specialty Exam: Review of Systems  Genitourinary:  Positive for difficulty urinating.       Burning  Psychiatric/Behavioral:  Positive for decreased concentration and dysphoric mood.   All other systems reviewed and are negative.  Blood pressure (!) 150/81, pulse 89, temperature (!) 97.5 F (36.4 C), temperature  source  Temporal, weight 101 lb (45.8 kg).Body mass index is 19.73 kg/m.  General Appearance: Casual  Eye Contact:  Fair  Speech:  Clear and Coherent  Volume:  Normal  Mood:  Depressed  Affect:  Depressed  Thought Process:  Goal Directed and Descriptions of Associations: Intact  Orientation:  Full (Time, Place, and Person)  Thought Content: Logical   Suicidal Thoughts:  No  Homicidal Thoughts:  No  Memory:  Immediate;   Fair Recent;   Fair Remote;   Fair  Judgement:  Fair  Insight:  Fair  Psychomotor Activity:  Normal  Concentration:  Concentration: Fair and Attention Span: Fair  Recall:  [employer] of Knowledge: Fair  Language: Fair  Akathisia:  No  Handed:  Right  AIMS (if indicated): done,0  Assets:  Communication Skills Desire for Improvement Housing Social Support Transportation  ADL's:  Intact  Cognition: WNL  Sleep:  Fair   Screenings: [place] Office Visit from 03/11/2021 in [place] Office Visit from 07/17/2020 in [place] Visit from 06/14/2020 in [place] Visit from 05/30/2020 in [facility] Admission (Discharged) from 01/02/2020 in [facility] Total Score 0 0 0 0 0      AUDIT    Flowsheet Row Admission (Discharged) from 01/02/2020 in [place]  Alcohol Use Disorder Identification Test Final Score (AUDIT) 0      GAD-7    Flowsheet Row Office Visit from 07/17/2020 in [place]  Total GAD-7 Score 5      PHQ2-9    [place] Visit from 03/11/2021 in [place] Visit from 12/24/2020 in [place] Visit from 10/29/2020 in [place] from 08/28/2020 in [place] Visit from  07/17/2020 in [place]  PHQ-2 Total Score 2 0 1 0 0  PHQ-9 Total Score 6 -- -- 3 5      [place] Visit from 03/11/2021 in [place] Visit from 07/17/2020 in [place] Visit from 06/14/2020 in [place] No Risk Error: Q3, 4, or 5 should not be populated when Q2 is No        Assessment and Plan: AVRIEL KANDEL is a 76 year old Caucasian female, divorced, lives in [place], has a history of bipolar disorder, multiple medical problems was evaluated in office today.  Patient with multiple psychosocial stressors currently struggling with possible UTI.  She will benefit from management of the same.  Patient will also benefit from continued psychotherapy sessions given her multiple stressors.  Discussed plan as noted below.  Plan Bipolar disorder , currently depressed-unspecified-unstable Tegretol extended release 400 mg p.o. twice daily Tegretol level -01/22/2021- 7.3 therapeutic, reviewed and discussed with patient. Depakote extended release 500 mg p.o. twice daily Depakote level-01/22/2021 - 60 -therapeutic Seroquel extended release 200 mg p.o. nightly Gabapentin 300 mg p.o. twice daily Will not make any medication changes yet, patient currently with a UTI and will need management for the same.  She also has other medical problems like vision changes and is scheduled to see her ophthalmologist.  She will need management of the same.  Insomnia-stable Continue Seroquel as prescribed She also has temazepam 7.5 mg p.o. nightly as needed available-she has been holding it, rarely uses  it.  TD-chronic-resolved We will monitor closely  GAD-stable Continue CBT. Gabapentin 300 mg p.o. twice daily Seroquel will also help  High risk medication use-I have reviewed and discussed Depakote level, Tegretol level-dated 01/22/2021-as  noted above.  Elevated blood pressure reading-patient to follow up with primary care provider.  Janett Billow CMA to take this patient to [facility] clinic urgent care since her primary care provider is not available for management of her UTI symptoms today.  Follow-up in clinic in 3 to 4 weeks or sooner if needed.  We will reevaluate patient for further medication changes at that visit.  Patient to call back sooner if she needs assistance sooner.  This note was generated in part or whole with voice recognition software. Voice recognition is usually quite accurate but there are transcription errors that can and very often do occur. I apologize for any typographical errors that were not detected and corrected.      Ursula Alert, MD 03/11/2021, 2:40 PM

## 2021-03-21 ENCOUNTER — Telehealth: Payer: Self-pay

## 2021-03-21 NOTE — Telephone Encounter (Signed)
Returned call to patient, she is worried about having tongue movements again-she had this before however that had resolved with discontinuation of mirtazapine in the past.  She seems to believe one of her friends mentioned that to her recently when she went out with her and wonders whether she has significant.  Patient wants to monitor herself and also have her friends and family observe her TD and she will come back into the office for reevaluation and at that time she could be reevaluated.  She is not interested in discontinuation of her Seroquel at this point.  She is not interested in [place].

## 2021-03-21 NOTE — Telephone Encounter (Signed)
pt left a message that she believes that the seroquel is giving her TD symptoms and she wanted to speak with dr. Shea Evans about this.

## 2021-04-01 ENCOUNTER — Telehealth: Payer: Self-pay

## 2021-04-01 NOTE — Telephone Encounter (Signed)
Thanks

## 2021-04-01 NOTE — Telephone Encounter (Signed)
pt just wanted to let you know that she is doing well and that she going to come to her appt tomorrow and that she will discuss other stuff with you tomorrow

## 2021-04-02 ENCOUNTER — Encounter: Payer: Self-pay | Admitting: Psychiatry

## 2021-04-02 ENCOUNTER — Other Ambulatory Visit: Payer: Self-pay

## 2021-04-02 ENCOUNTER — Ambulatory Visit (INDEPENDENT_AMBULATORY_CARE_PROVIDER_SITE_OTHER): Payer: Medicare PPO | Admitting: Psychiatry

## 2021-04-02 VITALS — BP 142/87 | HR 80 | Temp 97.5°F | Wt 98.6 lb

## 2021-04-02 DIAGNOSIS — G2401 Drug induced subacute dyskinesia: Secondary | ICD-10-CM | POA: Diagnosis not present

## 2021-04-02 DIAGNOSIS — F411 Generalized anxiety disorder: Secondary | ICD-10-CM | POA: Diagnosis not present

## 2021-04-02 DIAGNOSIS — Z79899 Other long term (current) drug therapy: Secondary | ICD-10-CM

## 2021-04-02 DIAGNOSIS — F5101 Primary insomnia: Secondary | ICD-10-CM | POA: Diagnosis not present

## 2021-04-02 DIAGNOSIS — F3176 Bipolar disorder, in full remission, most recent episode depressed: Secondary | ICD-10-CM

## 2021-04-02 NOTE — Progress Notes (Signed)
[facility] OP Progress Note  04/02/2021 2:22 PM SIGNA Stephanie [name]  MRN:  [id number]  Chief Complaint:  Chief Complaint   Follow-up 76 year old Caucasian female, has a history of bipolar disorder type I, generalized anxiety disorder, primary insomnia, tardive dyskinesia, multiple medical problems including Sjogren's syndrome, Raynaud's phenomenon, hypothyroidism, rheumatoid arthritis, coronary artery disease, IBS and multiple others presented for medication management.     HPI: Stephanie [name] is a 47 year old Caucasian female divorced, lives in [place], has a history of multiple medical problems as well as bipolar disorder, GAD, primary insomnia, tardive dyskinesia, Sjogren's syndrome, Raynaud's phenomenon, hypothyroidism, coronary artery disease, IBS, hyperlipidemia, fibromyalgia, GERD, recurrent UTI was evaluated in office today.  Patient today reports mood wise she is feeling better than before.  She reports she does not have any significant sadness or crying spells.  She feels more motivated to do things.  Patient reports she had her ophthalmology appointment and was told that she does not have any significant problems with her eyes other than blepharitis or dry eye.  Patient is currently using eyedrops more frequently for that is recommended and that has been helpful.  Patient reports anxiety symptoms are currently under control.  She reports sleep is overall okay.  Currently does not take the temazepam.  She is currently compliant on the Seroquel.  Patient denies any suicidality, homicidality or perceptual disturbances.  Continues to be motivated to keep her appointment with her therapist [name].  Patient was alert, oriented to person place time and situation in session today.  Patient with concerns of worsening TD, reports this may have been observed by a friend few weeks ago when she went out for dinner.  Patient was evaluated in session without her mask however did not appear to  have any tongue thrusting in session today.  She denies any other abnormal involuntary movements.  Patient denies any other concerns today.  Visit Diagnosis:    ICD-10-CM   1. Bipolar 1 disorder, depressed, full remission ([place])  F31.76 Prolactin    2. GAD (generalized anxiety disorder)  F41.1     3. Primary insomnia  F51.01     4. Tardive dyskinesia  G24.01     5. High risk medication use  Z79.899 Prolactin      Past Psychiatric History: Reviewed past psychiatric history from progress note on 06/17/2017.  Inpatient [facility] health admission at [facility]-01/02/2020 - 01/11/2020.  Past Medical History:  Past Medical History:  Diagnosis Date   Anginal pain ([place])    Anxiety    Bipolar 1 disorder ([place])    Bipolar disorder ([place])    Coronary artery disease    Depression    Fatigue    Fibromyalgia    GERD (gastroesophageal reflux disease)    Heart disease    Heart failure (H[facility])    HLD (hyperlipidemia)    HOH (hard of hearing)    Hypothyroidism    IBS (irritable bowel syndrome)    Pelvic pain in female    Perianal lesion    high grade sil lesion- keratinizing type   RA (rheumatoid arthritis) ([place])    Raynaud phenomenon    Sjogren's disease ([place])    Sleep apnea    Vaginal Pap smear, abnormal     Past Surgical History:  Procedure Laterality Date   CATARACT EXTRACTION W/PHACO Left 02/23/2017   Procedure: CATARACT EXTRACTION PHACO AND INTRAOCULAR LENS PLACEMENT ([place]);  Surgeon: Birder Robson, MD;  Location: [facility] ORS;  Service: Ophthalmology;  Laterality: Left;  [place] 00:35 AP% 9.6 CDE 3.47 Fluid pack lot # [id number]   CATARACT EXTRACTION W/PHACO Right 03/30/2017   Procedure: CATARACT EXTRACTION PHACO AND INTRAOCULAR LENS PLACEMENT ([facility]);  Surgeon: Birder Robson, MD;  Location: [facility] ORS;  Service: Ophthalmology;  Laterality: Right;  [place] 00:32.6 AP% 13.8 CDE 4.51 Fluid Pack Lot # [id number]   EYE SURGERY     TONSILLECTOMY      Family Psychiatric  History: Reviewed family psychiatric history from progress note on 06/17/2017.  Family History:  Family History  Problem Relation Age of Onset   Heart failure Father    Parkinson's disease Mother    Bipolar disorder Mother    Depression Sister    Schizophrenia Paternal Grandmother    Diabetes Maternal Grandmother    Pancreatic cancer Maternal Grandmother    Cancer Neg Hx    Heart disease Neg Hx    Breast cancer Neg Hx     Social History: Reviewed social history from progress note on 06/17/2017. Social History   Socioeconomic History   Marital status: Divorced    Spouse name: Not on file   Number of children: 2   Years of education: Not on file   Highest education level: Master's degree (e.g., MA, MS, MEng, MEd, MSW, MBA)  Occupational History    Comment: retired2  Tobacco Use   Smoking status: Never   Smokeless tobacco: Former  Scientific laboratory technician Use: Never used  Substance and Sexual Activity   Alcohol use: No    Alcohol/week: 0.0 standard drinks   Drug use: No   Sexual activity: Never    Birth control/protection: Post-menopausal  Other Topics Concern   Not on file  Social History Narrative   Not on file   Social Determinants of Health   Financial Resource Strain: Not on file  Food Insecurity: Not on file  Transportation Needs: Not on file  Physical Activity: Not on file  Stress: Not on file  Social Connections: Not on file    Allergies:  Allergies  Allergen Reactions   Amoxicillin-Pot Clavulanate Nausea Only and Other (See Comments)    Diarrhea Has patient had a PCN reaction causing immediate rash, facial/tongue/throat swelling, SOB or lightheadedness with hypotension:  Has patient had a PCN reaction causing severe rash involving mucus membranes or skin necrosis: Has patient had a PCN reaction that required hospitalization:  Has patient had a PCN reaction occurring within the last 10 years: If all of the above answers are "NO", then may proceed with  Cephalosporin use.    Oxycodone-Acetaminophen Nausea Only   Erythromycin Nausea Only   Azithromycin Nausea And Vomiting   Codeine    Macrolides And Ketolides    Oxycodone-Acetaminophen    Penicillins Other (See Comments)    Has patient had a PCN reaction causing immediate rash, facial/tongue/throat swelling, SOB or lightheadedness with hypotension: NO Has patient had a PCN reaction causing severe rash involving mucus membranes or skin necrosis: NO Has patient had a PCN reaction that required hospitalization: NO Has patient had a PCN reaction occurring within the last 10 years: NO If all of the above answers are "NO", then may proceed with Cephalosporin use.    Remeron [Mirtazapine]     Side effect-likely tardive dyskinesia   Tramadol Nausea And Vomiting   Zolpidem     Sleep walking   Lamotrigine Rash    LAMICTAL   Sulfa Antibiotics Rash    Metabolic Disorder Labs: Lab Results  Component Value Date  HGBA1C 5.3 11/22/2017   Lab Results  Component Value Date   PROLACTIN 33.0 (H) 11/22/2017   Lab Results  Component Value Date   CHOL 222 (H) 01/02/2020   TRIG 190 (H) 01/02/2020   HDL 69 01/02/2020   CHOLHDL 3.2 01/02/2020   VLDL 38 01/02/2020   LDLCALC 115 (H) 01/02/2020   LDLCALC 119 (H) 11/22/2017   Lab Results  Component Value Date   TSH 2.678 01/02/2020    Therapeutic Level Labs: No results found for: LITHIUM Lab Results  Component Value Date   VALPROATE 60 01/22/2021   VALPROATE 60 01/02/2020   No components found for:  CBMZ  Current Medications: Current Outpatient Medications  Medication Sig Dispense Refill   Acetaminophen 500 MG capsule Take 500 mg by mouth. Takes 500 mg bid prn     aspirin 81 MG chewable tablet Chew 81 mg by mouth daily with breakfast.     Calcium Citrate-Vitamin D 200-250 MG-UNIT TABS Take 1 tablet by mouth daily after breakfast.      carbamazepine (TEGRETOL XR) 200 MG 12 hr tablet Take 2 tablets (400 mg total) by mouth 2 (two)  times daily. 360 tablet 1   conjugated estrogens (PREMARIN) vaginal cream Estrogen Cream Instruction Discard applicator Apply pea sized amount to tip of finger to urethra before bed. Wash hands well after application. Use Monday, Wednesday and Friday 30 g 0   divalproex (DEPAKOTE ER) 500 MG 24 hr tablet Take 1 tablet twice a day 180 tablet 1   estradiol (ESTRACE VAGINAL) 0.1 MG/GM vaginal cream Apply 0.5mg  (pea-sized amount)  just inside the vaginal introitus with a finger-tip on Monday, Wednesday and Friday nights. 42.5 g 12   fluticasone (FLONASE) 50 MCG/ACT nasal spray      folic acid (FOLVITE) 1 MG tablet Take 2 mg by mouth daily with breakfast.      gabapentin (NEURONTIN) 300 MG capsule Take 1 tablet twice a day 180 capsule 1   ibandronate (BONIVA) 150 MG tablet every 30 (thirty) days.     levothyroxine (SYNTHROID, LEVOTHROID) 75 MCG tablet Take 75 mcg by mouth daily before breakfast.      methotrexate 2.5 MG tablet Take 15 mg by mouth every Wednesday. WEDNESDAYS AT LUNCH     Multiple Vitamins-Minerals (MULTIVITAMIN WITH MINERALS) tablet Take 1 tablet by mouth daily with breakfast.      MYRBETRIQ 50 MG TB24 tablet TAKE 1 TABLET BY MOUTH ONCE A DAY 30 tablet 11   pravastatin (PRAVACHOL) 80 MG tablet Take 1 tablet by mouth at bedtime.     predniSONE (DELTASONE) 10 MG tablet Take 1 tablet by mouth daily.     QUEtiapine (SEROQUEL XR) 200 MG 24 hr tablet Take 1 tablet (200 mg total) by mouth at bedtime. STOP SEROQUEL XR 150 MG AT BEDTIME 90 tablet 0   temazepam (RESTORIL) 7.5 MG capsule TAKE 1 CAPSULE BY MOUTH EVERY NIGHT AT BEDTIME 30 capsule 2   No current facility-administered medications for this visit.     Musculoskeletal: Strength & Muscle Tone: within normal limits Gait & Station: normal Patient leans: N/A  Psychiatric Specialty Exam: Review of Systems  Psychiatric/Behavioral:  Negative for agitation, behavioral problems, confusion, decreased concentration, dysphoric mood,  hallucinations, self-injury, sleep disturbance and suicidal ideas. The patient is not nervous/anxious and is not hyperactive.   All other systems reviewed and are negative.  Blood pressure (!) 142/87, pulse 80, temperature (!) 97.5 F (36.4 C), temperature source Temporal, weight 98 lb 9.6 oz (44.7 kg).Body  mass index is 19.26 kg/m.  General Appearance: Casual  Eye Contact:  Fair  Speech:  Clear and Coherent  Volume:  Normal  Mood:  Euthymic  Affect:  Congruent  Thought Process:  Goal Directed and Descriptions of Associations: Intact  Orientation:  Full (Time, Place, and Person)  Thought Content: Logical   Suicidal Thoughts:  No  Homicidal Thoughts:  No  Memory:  Immediate;   Fair Recent;   Fair Remote;   Fair  Judgement:  Fair  Insight:  Fair  Psychomotor Activity:  Normal  Concentration:  Concentration: Fair and Attention Span: Fair  Recall:  [employer] of Knowledge: Fair  Language: Fair  Akathisia:  No  Handed:  Right  AIMS (if indicated): done, 0  Assets:  Communication Skills Desire for Improvement Housing Social Support  ADL's:  Intact  Cognition: WNL  Sleep:  Fair   Screenings: [place] Office Visit from 03/11/2021 in [place] Office Visit from 07/17/2020 in [place] Visit from 06/14/2020 in [place] Visit from 05/30/2020 in [place] Admission (Discharged) from 01/02/2020 in [place] Total Score 0 0 0 0 0      AUDIT    Flowsheet Row Admission (Discharged) from 01/02/2020 in [facility]  Alcohol Use Disorder Identification Test Final Score (AUDIT) 0      GAD-7    Flowsheet Row Office Visit from 07/17/2020 in [place]  Total GAD-7 Score 5      PHQ2-9    [place] Visit from 04/02/2021 in [facility] Office Visit from 03/11/2021 in [facility] Visit from 12/24/2020 in [place] Visit from 10/29/2020 in [place] Visit from 08/28/2020 in [place]  PHQ-2 Total Score 2 2 0 1 0  PHQ-9 Total Score 5 6 -- -- 3      [place] Office Visit from 04/02/2021 in [facility] Office Visit from 03/11/2021 in [facility] [facility] Office Visit from 07/17/2020 in [place] [facility] No Risk Low Risk No Risk        Assessment and Plan: Stephanie [name] is a 28 year old Caucasian female, divorced, lives in [place], has a history of bipolar disorder, multiple medical problems was evaluated in office today.  Patient with multiple psychosocial stressors currently improving on the current medication regimen.  Plan as noted below.  Plan Bipolar disorder currently depressed in remission Tegretol extended release 400 mg p.o. twice daily Tegretol level-01/22/2021-7.3-therapeutic. Depakote extended release 500 mg p.o. twice daily Depakote level-01/22/2021-60-therapeutic Seroquel extended release 200 mg p.o. nightly.  We will consider tapering off Seroquel in the future as needed. Gabapentin 300 mg p.o. twice daily  Insomnia-stable Seroquel as prescribed She rarely takes temazepam 7.5 mg p.o. nightly.  Aware of long-term side effects.  TD-improved [place] - 0 Did not observe any tongue thrusting in session today.  GAD-stable Continue CBT Gabapentin 300 mg p.o. twice daily  High risk medication use-will order prolactin level.  Patient provided lab slip.  This needs to be monitored since she is on Seroquel which is an atypical antipsychotic.  I have coordinated care with [name] her therapist regarding this patient.  Follow-up in clinic in 3 months or  sooner if needed.  This note was generated in part  or whole with voice recognition software. Voice recognition is usually quite accurate but there are transcription errors that can and very often do occur. I apologize for any typographical errors that were not detected and corrected.     Ursula Alert, MD 04/02/2021, 3:42 PM

## 2021-04-03 ENCOUNTER — Telehealth: Payer: Self-pay

## 2021-04-03 NOTE — Telephone Encounter (Signed)
Medication management - Telephone call with pt, after she left a message just wanting to know more about why a prolactin level was needed?  Discussed with patient why Dr. Shea Evans would be checking this level and patient stated understanding.  Patient stated plan to take the lab order with her to her primary care provider who is getting ready to do other labs, and to add this lab to their orders so she can avoid multiple lab draws. Patient to call back if any other questions or concerns.

## 2021-04-07 ENCOUNTER — Other Ambulatory Visit: Payer: Self-pay

## 2021-04-07 ENCOUNTER — Ambulatory Visit: Payer: Medicare PPO | Admitting: Dermatology

## 2021-04-07 DIAGNOSIS — L821 Other seborrheic keratosis: Secondary | ICD-10-CM

## 2021-04-07 DIAGNOSIS — L578 Other skin changes due to chronic exposure to nonionizing radiation: Secondary | ICD-10-CM | POA: Diagnosis not present

## 2021-04-07 DIAGNOSIS — L820 Inflamed seborrheic keratosis: Secondary | ICD-10-CM | POA: Diagnosis not present

## 2021-04-07 DIAGNOSIS — D229 Melanocytic nevi, unspecified: Secondary | ICD-10-CM

## 2021-04-07 DIAGNOSIS — L814 Other melanin hyperpigmentation: Secondary | ICD-10-CM

## 2021-04-07 DIAGNOSIS — L2489 Irritant contact dermatitis due to other agents: Secondary | ICD-10-CM

## 2021-04-07 DIAGNOSIS — Z1283 Encounter for screening for malignant neoplasm of skin: Secondary | ICD-10-CM | POA: Diagnosis not present

## 2021-04-07 DIAGNOSIS — D1800 Hemangioma unspecified site: Secondary | ICD-10-CM

## 2021-04-07 MED ORDER — MOMETASONE FUROATE 0.1 % EX CREA: TOPICAL_CREAM | CUTANEOUS | 0 refills | Status: DC

## 2021-04-07 NOTE — Progress Notes (Signed)
Follow-Up Visit   Subjective  Stephanie [name] is a 76 y.o. female who presents for the following: Annual Exam (Rash on the L index finger x 2 weeks. She is trying OTC HC but it hasn't helped much. She thinks it may be an allergic reaction to a Band-Aid. ). The patient presents for Total-Body Skin Exam (TBSE) for skin cancer screening and mole check.  The patient has spots, moles and lesions to be evaluated, some may be new or changing.   The following portions of the chart were reviewed this encounter and updated as appropriate:   Tobacco   Allergies   Meds   Problems   Med Hx   Surg Hx   Fam Hx      Review of Systems:  No other skin or systemic complaints except as noted in HPI or Assessment and Plan.  Objective  Well appearing patient in no apparent distress; mood and affect are within normal limits.  A full examination was performed including scalp, head, eyes, ears, nose, lips, neck, chest, axillae, abdomen, back, buttocks, bilateral upper extremities, bilateral lower extremities, hands, feet, fingers, toes, fingernails, and toenails. All findings within normal limits unless otherwise noted below.  R calf x 1, R shoulder x 1 (2) Erythematous stuck-on, waxy papule or plaque   Assessment & Plan  Irritant contact dermatitis due to other agents L index finger  Due to bandage -   Start Mometasone 0.1% cream QD-BID until clear. Topical steroids (such as triamcinolone, fluocinolone, fluocinonide, mometasone, clobetasol, halobetasol, betamethasone, hydrocortisone) can cause thinning and lightening of the skin if they are used for too long in the same area. Your physician has selected the right strength medicine for your problem and area affected on the body. Please use your medication only as directed by your physician to prevent side effects.    mometasone (ELOCON) 0.1 % cream - L index finger Apply to rash on the left index finger once to twice daily until healed. Avoid applying to  face, groin, and axilla. Use as directed. Long-term use can cause thinning of the skin.  Inflamed seborrheic keratosis (2) R calf x 1, R shoulder x 1  Destruction of lesion - R calf x 1, R shoulder x 1 Complexity: simple   Destruction method: cryotherapy   Informed consent: discussed and consent obtained   Timeout:  patient name, date of birth, surgical site, and procedure verified Lesion destroyed using liquid nitrogen: Yes   Region frozen until ice ball extended beyond lesion: Yes   Outcome: patient tolerated procedure well with no complications   Post-procedure details: wound care instructions given    Skin cancer screening  Lentigines - Scattered tan macules - Due to sun exposure - Benign-appearing, observe - Recommend daily broad spectrum sunscreen SPF 30+ to sun-exposed areas, reapply every 2 hours as needed. - Call for any changes  Seborrheic Keratoses - Stuck-on, waxy, tan-brown papules and/or plaques  - Benign-appearing - Discussed benign etiology and prognosis. - Observe - Call for any changes  Melanocytic Nevi - Tan-brown and/or pink-flesh-colored symmetric macules and papules - Benign appearing on exam today - Observation - Call clinic for new or changing moles - Recommend daily use of broad spectrum spf 30+ sunscreen to sun-exposed areas.   Hemangiomas - Red papules - Discussed benign nature - Observe - Call for any changes  Actinic Damage - Chronic condition, secondary to cumulative UV/sun exposure - diffuse scaly erythematous macules with underlying dyspigmentation - Recommend daily broad spectrum sunscreen  SPF 30+ to sun-exposed areas, reapply every 2 hours as needed.  - Staying in the shade or wearing long sleeves, sun glasses (UVA+UVB protection) and wide brim hats (4-inch brim around the entire circumference of the hat) are also recommended for sun protection.  - Call for new or changing lesions.  Skin cancer screening performed today.  Return  in about 1 year (around 04/07/2022) for TBSE.  Luther Redo, CMA, am acting as scribe for Sarina Ser, MD . Documentation: I have reviewed the above documentation for accuracy and completeness, and I agree with the above.  Sarina Ser, MD

## 2021-04-07 NOTE — Patient Instructions (Addendum)
If You Need Anything After Your Visit ° °If you have any questions or concerns for your doctor, please call our main line at [phone] and press option 4 to reach your doctor's medical assistant. If no one answers, please leave a voicemail as directed and we will return your call as soon as possible. Messages left after 4 pm will be answered the following business day.  ° °You may also send us a message via MyChart. We typically respond to MyChart messages within 1-2 business days. ° °For prescription refills, please ask your pharmacy to contact our office. Our fax number is [phone]. ° °If you have an urgent issue when the clinic is closed that cannot wait until the next business day, you can page your doctor at the number below.   ° °Please note that while we do our best to be available for urgent issues outside of office hours, we are not available 24/7.  ° °If you have an urgent issue and are unable to reach us, you may choose to seek medical care at your doctor's office, retail clinic, urgent care center, or emergency room. ° °If you have a medical emergency, please immediately call 911 or go to the emergency department. ° °Pager Numbers ° °- Dr. Kowalski: [phone] ° °- Dr. Moye: [phone] ° °- Dr. Stewart: [phone] ° °In the event of inclement weather, please call our main line at [phone] for an update on the status of any delays or closures. ° °Dermatology Medication Tips: °Please keep the boxes that topical medications come in in order to help keep track of the instructions about where and how to use these. Pharmacies typically print the medication instructions only on the boxes and not directly on the medication tubes.  ° °If your medication is too expensive, please contact our office at [phone] option 4 or send us a message through MyChart.  ° °We are unable to tell what your co-pay for medications will be in advance as this is different depending on your insurance coverage.  However, we may be able to find a substitute medication at lower cost or fill out paperwork to get insurance to cover a needed medication.  ° °If a prior authorization is required to get your medication covered by your insurance company, please allow us 1-2 business days to complete this process. ° °Drug prices often vary depending on where the prescription is filled and some pharmacies may offer cheaper prices. ° °The website www.goodrx.com contains coupons for medications through different pharmacies. The prices here do not account for what the cost may be with help from insurance (it may be cheaper with your insurance), but the website can give you the price if you did not use any insurance.  °- You can print the associated coupon and take it with your prescription to the pharmacy.  °- You may also stop by our office during regular business hours and pick up a GoodRx coupon card.  °- If you need your prescription sent electronically to a different pharmacy, notify our office through [facility] MyChart or by phone at [phone] option 4. ° ° ° ° °Si Usted Necesita Algo Después de Su Visita ° °También puede enviarnos un mensaje a través de MyChart. Por lo general respondemos a los mensajes de MyChart en el transcurso de 1 a 2 días hábiles. ° °Para renovar recetas, por favor pida a su farmacia que se ponga en contacto con nuestra oficina. Nuestro número de fax es el [phone]. ° °Si tiene   un asunto urgente cuando la clnica est cerrada y que no puede esperar hasta el siguiente da hbil, puede llamar/localizar a su doctor(a) al nmero que aparece a continuacin.   Por favor, tenga en cuenta que aunque hacemos todo lo posible para estar disponibles para asuntos urgentes fuera del horario de [place], no estamos disponibles las 24 horas del da, los 7 das de la [place].   Si tiene un problema urgente y no puede comunicarse con nosotros, puede optar por buscar atencin mdica  en el consultorio de su  doctor(a), en una clnica privada, en un centro de atencin urgente o en una sala de emergencias.  Si tiene Engineering geologist, por favor llame inmediatamente al 911 o vaya a la sala de emergencias.  Nmeros de bper  - Dr. Nehemiah Massed: [phone]  - Dra. Moye: [phone]  - Dra. Nicole Kindred: [phone]  En caso de inclemencias del [place], por favor llame a Johnsie Kindred principal al [phone] para una actualizacin sobre el [place] de cualquier retraso o cierre.  Consejos para la medicacin en dermatologa: Por favor, guarde las cajas en las que vienen los medicamentos de uso tpico para ayudarle a seguir las instrucciones sobre dnde y cmo usarlos. Las farmacias generalmente imprimen las instrucciones del medicamento slo en las cajas y no directamente en los tubos del [place].   Si su medicamento es muy caro, por favor, pngase en contacto con Zigmund Daniel llamando al [phone] y presione la opcin 4 o envenos un mensaje a travs de Pharmacist, community.   No podemos decirle cul ser su copago por los medicamentos por adelantado ya que esto es diferente dependiendo de la cobertura de su seguro. Sin embargo, es posible que podamos encontrar un medicamento sustituto a Electrical engineer un formulario para que el seguro cubra el medicamento que se considera necesario.   Si se requiere una autorizacin previa para que su compaa de seguros [place] su medicamento, por favor permtanos de 1 a 2 das hbiles para completar este proceso.  Los precios de los medicamentos varan con frecuencia dependiendo del Environmental consultant de dnde se surte la receta y alguna farmacias pueden ofrecer precios ms baratos.  El sitio web www.goodrx.com tiene cupones para medicamentos de Airline pilot. Los precios aqu no tienen en cuenta lo que podra costar con la ayuda del seguro (puede ser ms barato con su seguro), pero el sitio web puede darle el precio si no utiliz Research scientist (physical sciences).  - Puede imprimir el cupn  correspondiente y llevarlo con su receta a la farmacia.  - Tambin puede pasar por nuestra oficina durante el horario de atencin regular y Charity fundraiser una tarjeta de cupones de GoodRx.  - Si necesita que su receta se enve electrnicamente a una farmacia diferente, informe a nuestra oficina a travs de MyChart de [place] o por telfono llamando al [phone] y presione la opcin 4.  Topical steroids (such as triamcinolone, fluocinolone, fluocinonide, mometasone, clobetasol, halobetasol, betamethasone, hydrocortisone) can cause thinning and lightening of the skin if they are used for too long in the same area. Your physician has selected the right strength medicine for your problem and area affected on the body. Please use your medication only as directed by your physician to prevent side effects.   Start Mometasone 0.1% cream to the rash on the left index finger once to twice daily until resolved.

## 2021-04-08 ENCOUNTER — Encounter: Payer: Self-pay | Admitting: Dermatology

## 2021-04-09 ENCOUNTER — Telehealth: Payer: Self-pay

## 2021-04-09 NOTE — Telephone Encounter (Signed)
Patient forgot to show you her thumb at [facility] appointment. Patient states she has a crack on the thumb and the skin is very dry. Can she use the Mometasone to the thumb? Or should she stick with a antibiotic cream/ointment?

## 2021-04-10 NOTE — Telephone Encounter (Signed)
Left message for patient to return my call. aw 

## 2021-04-10 NOTE — Telephone Encounter (Signed)
Spoke with pt and advised her of Dr. Alveria Apley recommendation.

## 2021-04-15 NOTE — Telephone Encounter (Signed)
Patient called and left voicemail that she has had improvement with mometasone on finger and thumb and appreciates our staff.

## 2021-04-16 ENCOUNTER — Other Ambulatory Visit: Payer: Self-pay | Admitting: Family Medicine

## 2021-04-16 DIAGNOSIS — Z1231 Encounter for screening mammogram for malignant neoplasm of breast: Secondary | ICD-10-CM

## 2021-04-22 ENCOUNTER — Other Ambulatory Visit: Payer: Self-pay | Admitting: Psychiatry

## 2021-04-22 DIAGNOSIS — F3174 Bipolar disorder, in full remission, most recent episode manic: Secondary | ICD-10-CM

## 2021-04-23 ENCOUNTER — Other Ambulatory Visit: Payer: Self-pay | Admitting: Psychiatry

## 2021-04-23 DIAGNOSIS — F411 Generalized anxiety disorder: Secondary | ICD-10-CM

## 2021-04-23 DIAGNOSIS — F5101 Primary insomnia: Secondary | ICD-10-CM

## 2021-04-23 DIAGNOSIS — F3174 Bipolar disorder, in full remission, most recent episode manic: Secondary | ICD-10-CM

## 2021-05-08 ENCOUNTER — Telehealth: Payer: Self-pay | Admitting: Urology

## 2021-05-08 DIAGNOSIS — N362 Urethral caruncle: Secondary | ICD-10-CM

## 2021-05-08 MED ORDER — ESTRADIOL 0.1 MG/GM VA CREA: TOPICAL_CREAM | VAGINAL | 12 refills | Status: DC

## 2021-05-08 NOTE — Telephone Encounter (Signed)
Pt called in in regards to the medication Estradiol. I'm not really sure what she was wanting. She said something about she has refills, but is not able to get it without [place] ordering it and wants to know if she should wait until her appointment in July to speak with her about it.  ?

## 2021-05-08 NOTE — Telephone Encounter (Signed)
.  left message to have patient return my call. ? ?Rx sent to [facility] ?

## 2021-05-14 ENCOUNTER — Telehealth: Payer: Self-pay | Admitting: Urology

## 2021-05-14 NOTE — Telephone Encounter (Signed)
Patient called and stated that she uses Estradiol normally every Monday Wednesday and Friday.  She stated that this week she forgot to use the Estradiol on Monday so she used it on Tuesday.  Patient is asking if she should use the cream again tonight (Wednesday) and then Friday or if she wait and use it on Thursday and then again on Saturday.  Please advise ?

## 2021-05-14 NOTE — Telephone Encounter (Signed)
Spoke with patient and advised results ?It should be used 3 days per week it's ok to use today then friday ?

## 2021-05-15 ENCOUNTER — Encounter: Payer: Medicare PPO | Admitting: Dermatology

## 2021-05-22 ENCOUNTER — Ambulatory Visit
Admission: RE | Admit: 2021-05-22 | Discharge: 2021-05-22 | Disposition: A | Discharge status: Home / Self Care | Payer: Medicare PPO | Source: Ambulatory Visit | Attending: Family Medicine | Admitting: Family Medicine

## 2021-05-22 ENCOUNTER — Other Ambulatory Visit: Payer: Self-pay

## 2021-05-22 DIAGNOSIS — Z1231 Encounter for screening mammogram for malignant neoplasm of breast: Secondary | ICD-10-CM | POA: Diagnosis not present

## 2021-06-04 ENCOUNTER — Telehealth: Payer: Self-pay | Admitting: Urology

## 2021-06-04 ENCOUNTER — Other Ambulatory Visit: Payer: Self-pay | Admitting: Psychiatry

## 2021-06-04 DIAGNOSIS — F3174 Bipolar disorder, in full remission, most recent episode manic: Secondary | ICD-10-CM

## 2021-06-04 NOTE — Telephone Encounter (Signed)
.  left message to have patient return my call.  

## 2021-06-04 NOTE — Telephone Encounter (Signed)
Pt LMOM and wants to know if there are any substitutions for Myrbetriq.   ?

## 2021-06-05 NOTE — Telephone Encounter (Signed)
Please let Mrs. Hoggard know that we received a request from her [employer] stating that she is requesting a cost saving alternative to [employer].  If she did not request this, we will go ahead and ignore the request.  If she did requested, the 2 medications (oxybutynin and tolterodine) have the potential side effects of causing issues with memory, constipation, dry eyes/mouth and are not recommended in individuals over the age of 32.  If she would still like [place] to send a prescription in for either oxybutynin or tolterodine, we will send the request for the change to her pharmacy. ?

## 2021-06-05 NOTE — Telephone Encounter (Signed)
Patient called re: MYRBETRIQ 50 MG TB24 tablet , Insurance will not cover and needs a different medication. ?

## 2021-06-11 NOTE — Telephone Encounter (Signed)
Please call Stephanie [name] regarding this phone note. ?

## 2021-06-11 NOTE — Telephone Encounter (Signed)
Advised pt of the potential alternatives and their side effects (see message below). Pt reports Myrbetriq is her most expensive Rx and she is trying to lower some of her cost. Pt is very hesitant to switch medications as she states Myrbetriq is working well for her. Pt would like to know is there a generic, and if there is not, when will one be available?  ?

## 2021-06-12 NOTE — Telephone Encounter (Signed)
Advised pt samples were up front for pickup at her convenience. Pt expressed understanding. ?

## 2021-06-23 ENCOUNTER — Telehealth: Payer: Self-pay

## 2021-06-23 DIAGNOSIS — F3111 Bipolar disorder, current episode manic without psychotic features, mild: Secondary | ICD-10-CM | POA: Insufficient documentation

## 2021-06-23 DIAGNOSIS — Z9189 Other specified personal risk factors, not elsewhere classified: Secondary | ICD-10-CM | POA: Insufficient documentation

## 2021-06-23 MED ORDER — QUETIAPINE FUMARATE ER 50 MG PO TB24: 50.0000 mg | ORAL_TABLET | Freq: Every day | ORAL | 0 refills | Status: DC

## 2021-06-23 NOTE — Telephone Encounter (Signed)
pt called states she is returning dr. Shea Evans call  ?

## 2021-06-23 NOTE — Telephone Encounter (Signed)
Attempted to contact patient, no response, had to leave a voicemail. ?

## 2021-06-23 NOTE — Telephone Encounter (Signed)
pt called left message that she needs her medication increase. that she is very manic due to the seasonal change and that she no sleeping also.  ?

## 2021-06-23 NOTE — Telephone Encounter (Signed)
Contacted patient.  She reports she is not sleeping, making phone calls texting more, more energetic and feels she is manic.  Discussed increasing Seroquel to 250 mg p.o. nightly. ? ?We will order EKG to monitor QTc.  She will contact [phone] to schedule an appointment. ? ?Patient also reported she tried calling crisis line and that it was not working when she called. ? ?I contacted crisis line as well as sent a message to staff to address this problem. ?

## 2021-06-23 NOTE — Addendum Note (Signed)
Addended byUrsula Alert on: 06/23/2021 05:55 PM ? ? Modules accepted: Orders ? ?

## 2021-06-24 NOTE — Telephone Encounter (Signed)
Like I discussed with her yesterday the last EKG was in October and since we do not have a recent one on file and due to this medication side effects on her cardiac health , recommend repeating EKG to monitor. ? ?Please let patient know. ? ? ?

## 2021-06-24 NOTE — Telephone Encounter (Signed)
pt called back again and stated that she got her '50mg'$  seroqueal and she was sorry for the attitude but she just wanted to know why she has to have one done now when she hadn't had one before.  ?

## 2021-06-24 NOTE — Telephone Encounter (Signed)
pt called left message that she didn't understand why she needed to get a ekg done when she been on higher lever of seroquel and didn't have a EKG done.  she states that she trying not to drive much and she didn't want to take a cap because they smell like smoke . ? ?She just wants to know why she needs to get ekg done. ? ?

## 2021-07-01 ENCOUNTER — Ambulatory Visit (INDEPENDENT_AMBULATORY_CARE_PROVIDER_SITE_OTHER): Payer: Medicare PPO | Admitting: Psychiatry

## 2021-07-01 ENCOUNTER — Encounter: Payer: Self-pay | Admitting: Psychiatry

## 2021-07-01 VITALS — BP 147/87 | HR 77 | Temp 98.1°F | Ht 58.5 in | Wt 101.0 lb

## 2021-07-01 DIAGNOSIS — F411 Generalized anxiety disorder: Secondary | ICD-10-CM

## 2021-07-01 DIAGNOSIS — F3173 Bipolar disorder, in partial remission, most recent episode manic: Secondary | ICD-10-CM | POA: Diagnosis not present

## 2021-07-01 DIAGNOSIS — F5101 Primary insomnia: Secondary | ICD-10-CM

## 2021-07-01 DIAGNOSIS — Z79899 Other long term (current) drug therapy: Secondary | ICD-10-CM | POA: Diagnosis not present

## 2021-07-01 MED ORDER — QUETIAPINE FUMARATE ER 200 MG PO TB24: 200.0000 mg | ORAL_TABLET | Freq: Every day | ORAL | 1 refills | Status: DC

## 2021-07-01 NOTE — Progress Notes (Signed)
[facility] OP Progress Note ? ?07/01/2021 2:59 PM ?Stephanie [name]  ?MRN:  [id number] ? ?Chief Complaint:  ?Chief Complaint  ?Patient presents with  ? Follow-up: 76 year old Caucasian female who has a history of bipolar disorder type I, generalized anxiety, primary insomnia, tardive dyskinesia, multiple medical problems including Sjogren's syndrome, Raynaud's phenomenon, hypothyroidism and multiple other medical problems presented for medication management.  ? ?HPI: Stephanie [name] is a 12 year old Caucasian female, divorced, lives in [place], has a history of multiple medical problems as well as bipolar disorder, GAD, primary insomnia, tardive dyskinesia, Sjogren's syndrome, Raynaud's phenomenon, hypothyroidism, coronary artery disease, IBS, hyperlipidemia, fibromyalgia, GERD, recurrent UTI was evaluated in office today. ? ?Patient today reports that she is getting better with regards to her most recent manic episode.  Patient had contacted the clinic few days ago reporting elevated mood, sleep problems as well as excessive texting to her friends.  Patient at that time was advised to take a higher dosage of Seroquel, currently taking Seroquel extended release 250 mg at bedtime.  Patient reports her manic symptoms as improved a lot since then.  She reports positive response to the medication.  Patient however reports she has noticed that she has been off balance since the past few days.  Patient does not believe the higher dosage of Seroquel could be contributing to this rather believes that she stopped doing Tai Chi due to her TV being broken.  That likely could be contributing to it.  Patient reports she may have had a fall couple of days ago however she fell on the carpet and it did not cause any injuries.  Patient reports at that time she did not have her cane.  Now she is mindful of that and has been using her cane on a regular basis.  Patient reports she has started going to [employer] 3 days a week.  She plans to  continue to do that. ? ?Patient reports sleep as good.  She uses the temazepam rarely, only when she has a doctor's appointment or something the next day. ? ?Patient denies any suicidality, homicidality or perceptual disturbances. ? ?Patient reports appetite is fair.  She continues to receive Meals on Wheels. ? ?She continues to be part of a church group and also has good support system from her friends and family. ? ?Reports she is motivated to stay in therapy and has upcoming appointment with her therapist [name]. ? ?Denies any other concerns today. ? ?Visit Diagnosis:  ?  ICD-10-CM   ?1. Bipolar disorder, in partial remission, most recent episode manic ([place])  F31.73   ?  ?2. GAD (generalized anxiety disorder)  F41.1 QUEtiapine (SEROQUEL XR) 200 MG 24 hr tablet  ?  ?3. Primary insomnia  F51.01 QUEtiapine (SEROQUEL XR) 200 MG 24 hr tablet  ?  ?4. High risk medication use  Z79.899   ?  ? ? ?Past Psychiatric History: Reviewed past psychiatric history from progress note on 06/17/2017.  [facility] health admission at [facility]-01/02/2020 - 01/11/2020. ? ?Past Medical History:  ?Past Medical History:  ?Diagnosis Date  ? Anginal pain ([place])   ? Anxiety   ? Bipolar 1 disorder ([place])   ? Bipolar disorder ([place])   ? Coronary artery disease   ? Depression   ? Fatigue   ? Fibromyalgia   ? GERD (gastroesophageal reflux disease)   ? Heart disease   ? Heart failure ([place])   ? HLD (hyperlipidemia)   ? HOH (hard  of hearing)   ? Hypothyroidism   ? IBS (irritable bowel syndrome)   ? Pelvic pain in female   ? Perianal lesion   ? high grade sil lesion- keratinizing type  ? RA (rheumatoid arthritis) ([place])   ? Raynaud phenomenon   ? Sjogren's disease ([place])   ? Sleep apnea   ? Vaginal Pap smear, abnormal   ?  ?Past Surgical History:  ?Procedure Laterality Date  ? CATARACT EXTRACTION W/PHACO Left 02/23/2017  ? Procedure: CATARACT EXTRACTION PHACO AND INTRAOCULAR LENS PLACEMENT (IOC);  Surgeon: Birder Robson, MD;  Location: [facility] ORS;  Service: Ophthalmology;  Laterality: Left;  [place] 00:35 ?AP% 9.6 ?CDE 3.47 ?Fluid pack lot # [id number]  ? CATARACT EXTRACTION W/PHACO Right 03/30/2017  ? Procedure: CATARACT EXTRACTION PHACO AND INTRAOCULAR LENS PLACEMENT (IOC);  Surgeon: Birder Robson, MD;  Location: [facility] ORS;  Service: Ophthalmology;  Laterality: Right;  [place] 00:32.6 ?AP% 13.8 ?CDE 4.51 ?Fluid Pack Lot # [id number]  ? EYE SURGERY    ? TONSILLECTOMY    ? ? ?Family Psychiatric History: Reviewed family psychiatric history from progress note on 06/17/2017. ? ?Family History:  ?Family History  ?Problem Relation Age of Onset  ? Heart failure Father   ? Parkinson's disease Mother   ? Bipolar disorder Mother   ? Depression Sister   ? Schizophrenia Paternal Grandmother   ? Diabetes Maternal Grandmother   ? Pancreatic cancer Maternal Grandmother   ? Cancer Neg Hx   ? Heart disease Neg Hx   ? Breast cancer Neg Hx   ? ? ?Social History: Reviewed social history from progress note on 06/17/2017. ?Social History  ? ?Socioeconomic History  ? Marital status: Divorced  ?  Spouse name: Not on file  ? Number of children: 2  ? Years of education: Not on file  ? Highest education level: Master's degree (e.g., MA, MS, MEng, MEd, MSW, MBA)  ?Occupational History  ?  Comment: retired2  ?Tobacco Use  ? Smoking status: Never  ? Smokeless tobacco: Former  ?Vaping Use  ? Vaping Use: Never used  ?Substance and Sexual Activity  ? Alcohol use: No  ?  Alcohol/week: 0.0 standard drinks  ? Drug use: No  ? Sexual activity: Never  ?  Birth control/protection: Post-menopausal  ?Other Topics Concern  ? Not on file  ?Social History Narrative  ? Not on file  ? ?Social Determinants of Health  ? ?Financial Resource Strain: Not on file  ?Food Insecurity: Not on file  ?Transportation Needs: Not on file  ?Physical Activity: Not on file  ?Stress: Not on file  ?Social Connections: Not on file  ? ? ?Allergies:  ?Allergies  ?Allergen Reactions  ? Amoxicillin-Pot  Clavulanate Nausea Only and Other (See Comments)  ?  Diarrhea ?Has patient had a PCN reaction causing immediate rash, facial/tongue/throat swelling, SOB or lightheadedness with hypotension:  ?Has patient had a PCN reaction causing severe rash involving mucus membranes or skin necrosis: ?Has patient had a PCN reaction that required hospitalization:  ?Has patient had a PCN reaction occurring within the last 10 years: ?If all of the above answers are "NO", then may proceed with Cephalosporin use. ?  ? Oxycodone-Acetaminophen Nausea Only  ? Erythromycin Nausea Only  ? Azithromycin Nausea And Vomiting  ? Codeine   ? Macrolides And Ketolides   ? Oxycodone-Acetaminophen   ? Penicillins Other (See Comments)  ?  Has patient had a PCN reaction causing immediate rash, facial/tongue/throat swelling, SOB or lightheadedness with hypotension: NO ?Has patient  had a PCN reaction causing severe rash involving mucus membranes or skin necrosis: NO ?Has patient had a PCN reaction that required hospitalization: NO ?Has patient had a PCN reaction occurring within the last 10 years: NO ?If all of the above answers are "NO", then may proceed with Cephalosporin use. ?  ? Remeron [Mirtazapine]   ?  Side effect-likely tardive dyskinesia  ? Tramadol Nausea And Vomiting  ? Zolpidem   ?  Sleep walking  ? Lamotrigine Rash  ?  LAMICTAL  ? Sulfa Antibiotics Rash  ? ? ?Metabolic Disorder Labs: ?Lab Results  ?Component Value Date  ? HGBA1C 5.3 11/22/2017  ? ?Lab Results  ?Component Value Date  ? PROLACTIN 33.0 (H) 11/22/2017  ? ?Lab Results  ?Component Value Date  ? CHOL 222 (H) 01/02/2020  ? TRIG 190 (H) 01/02/2020  ? HDL 69 01/02/2020  ? CHOLHDL 3.2 01/02/2020  ? VLDL 38 01/02/2020  ? [place] 115 (H) 01/02/2020  ? LDLCALC 119 (H) 11/22/2017  ? ?Lab Results  ?Component Value Date  ? TSH 2.678 01/02/2020  ? ? ?Therapeutic Level Labs: ?No results found for: LITHIUM ?Lab Results  ?Component Value Date  ? VALPROATE 60 01/22/2021  ? VALPROATE 60  01/02/2020  ? ?No components found for:  CBMZ ? ?Current Medications: ?Current Outpatient Medications  ?Medication Sig Dispense Refill  ? Acetaminophen 500 MG capsule Take 500 mg by mouth. Takes 500 mg bid prn    ? as

## 2021-07-08 ENCOUNTER — Telehealth: Payer: Self-pay

## 2021-07-08 NOTE — Telephone Encounter (Signed)
Contacted patient, discussed her sleep concern.  Patient reports she took temazepam and was able to sleep okay.  Provided education about  adverse side effects including fall risk.  Patient to be mindful. ?

## 2021-07-08 NOTE — Telephone Encounter (Signed)
pt called left message that she has an appt for her ekg on 07-16-21 @ 10:00.  she also states that she is not sleeping well. that she wakes up and can not go back to sleep.  ?

## 2021-07-10 ENCOUNTER — Telehealth: Payer: Self-pay | Admitting: Urology

## 2021-07-10 NOTE — Telephone Encounter (Signed)
Pt LMOM and wants to know if she can have more samples of Myrbetriq (she didn't say the milligrams) to be left up front so she could pick up on 5/10 when she comes for her appt at different facility. ?

## 2021-07-11 ENCOUNTER — Ambulatory Visit: Payer: Medicare PPO

## 2021-07-11 NOTE — Telephone Encounter (Signed)
Advised pt Myrbetriq rep was supposed to be coming on Monday and I would call her then to let her know ab samples. Pt voiced understanding.  ?

## 2021-07-14 NOTE — Telephone Encounter (Signed)
Samples placed up front for patient pick up. Pt notified.  ?

## 2021-07-16 ENCOUNTER — Ambulatory Visit
Admission: RE | Admit: 2021-07-16 | Discharge: 2021-07-16 | Disposition: A | Discharge status: Home / Self Care | Payer: Medicare PPO | Source: Ambulatory Visit | Attending: Psychiatry | Admitting: Psychiatry

## 2021-07-16 DIAGNOSIS — Z5181 Encounter for therapeutic drug level monitoring: Secondary | ICD-10-CM | POA: Diagnosis not present

## 2021-07-16 DIAGNOSIS — I4581 Long QT syndrome: Secondary | ICD-10-CM | POA: Diagnosis not present

## 2021-07-16 DIAGNOSIS — F309 Manic episode, unspecified: Secondary | ICD-10-CM | POA: Insufficient documentation

## 2021-07-18 ENCOUNTER — Telehealth: Payer: Self-pay | Admitting: Psychiatry

## 2021-07-18 NOTE — Telephone Encounter (Signed)
Attempted to contact patient to discuss EKG, left voicemail. ?

## 2021-07-21 ENCOUNTER — Telehealth: Payer: Self-pay

## 2021-07-21 ENCOUNTER — Telehealth (INDEPENDENT_AMBULATORY_CARE_PROVIDER_SITE_OTHER): Payer: Medicare PPO | Admitting: Psychiatry

## 2021-07-21 ENCOUNTER — Encounter: Payer: Self-pay | Admitting: Psychiatry

## 2021-07-21 ENCOUNTER — Other Ambulatory Visit: Payer: Self-pay | Admitting: Psychiatry

## 2021-07-21 DIAGNOSIS — F5101 Primary insomnia: Secondary | ICD-10-CM | POA: Diagnosis not present

## 2021-07-21 DIAGNOSIS — F411 Generalized anxiety disorder: Secondary | ICD-10-CM | POA: Diagnosis not present

## 2021-07-21 DIAGNOSIS — Z9189 Other specified personal risk factors, not elsewhere classified: Secondary | ICD-10-CM

## 2021-07-21 DIAGNOSIS — F3111 Bipolar disorder, current episode manic without psychotic features, mild: Secondary | ICD-10-CM

## 2021-07-21 DIAGNOSIS — F3173 Bipolar disorder, in partial remission, most recent episode manic: Secondary | ICD-10-CM

## 2021-07-21 MED ORDER — QUETIAPINE FUMARATE ER 300 MG PO TB24: 300.0000 mg | ORAL_TABLET | Freq: Every day | ORAL | 0 refills | Status: DC

## 2021-07-21 NOTE — Progress Notes (Addendum)
Virtual Visit via Telephone Note ? ?I connected with Stephanie [name] on 07/21/21 at  1:00 PM EDT by telephone and verified that I am speaking with the correct person using two identifiers. ? ?Location ?Provider Location : [facility] ?Patient Location : Home ? ?Participants: Patient , Provider ? ?  ?I discussed the limitations, risks, security and privacy concerns of performing an evaluation and management service by telephone and the availability of in person appointments. I also discussed with the patient that there may be a patient responsible charge related to this service. The patient expressed understanding and agreed to proceed. ?  ?I discussed the assessment and treatment plan with the patient. The patient was provided an opportunity to ask questions and all were answered. The patient agreed with the plan and demonstrated an understanding of the instructions. ?  ?The patient was advised to call back or seek an in-person evaluation if the symptoms worsen or if the condition fails to improve as anticipated. ? ? ?[place] MD OP Progress Note ? ?07/21/2021 9:24 AM ?Stephanie [name]  ?MRN:  [id number] ? ?Chief Complaint:  ?Chief Complaint  ?Patient presents with  ? Follow-up: 76 year old Caucasian female who has a history of bipolar disorder type I, generalized anxiety disorder, primary insomnia, GAD, multiple medical problems including Sjogren's syndrome, Raynaud's phenomenon, hypothyroidism, multiple other medical problems, presented for medication management.  ? ?HPI: Stephanie [name] is a 76 year old Caucasian female, divorced, lives in [place], has a history of multiple medical problems as well as bipolar disorder, GAD, primary insomnia, tardive dyskinesia, Sjogren's syndrome, Raynaud's phenomenon, hypothyroidism, coronary artery disease, IBS, hyperlipidemia, fibromyalgia, GERD, was evaluated by phone today. ? ?Patient had contacted the clinic reporting worsening mood symptoms, manic symptoms.  This appointment was hence  scheduled. ? ?Patient today appeared to be hyperverbal, had to be redirected during our conversation.  Patient reports she continues to have manic symptoms .  She reports she feels she is manic since she has not been able to sleep and and has concentration problems.  Patient also appeared to be anxious during our conversation, reports relationship struggles with her daughter. ? ?Patient reports she may have skipped her medication here and there when her family, her daughter and her children came to visit. ? ?Patient denies any suicidality, homicidality or perceptual disturbances. ? ?Patient continues to follow-up with her therapist. ? ?Denies side effects to medications. ? ?Currently going to the gym as well as practicing tai chi which helps. ? ? ? ?Visit Diagnosis:  ?  ICD-10-CM   ?1. Bipolar disorder, in partial remission, most recent episode manic (HCC)  F31.73 QUEtiapine (SEROQUEL XR) 300 MG 24 hr tablet  ?  ?2. GAD (generalized anxiety disorder)  F41.1 QUEtiapine (SEROQUEL XR) 300 MG 24 hr tablet  ?  ?3. Primary insomnia  F51.01 QUEtiapine (SEROQUEL XR) 300 MG 24 hr tablet  ?  ? ? ?Past Psychiatric History: Reviewed past psychiatric history from progress note on 06/17/2017.  Inpatient mental health admission at [facility]-01/02/2020 - 01/11/2020. ? ?Past Medical History:  ?Past Medical History:  ?Diagnosis Date  ? Anginal pain ([place])   ? Anxiety   ? Bipolar 1 disorder ([place])   ? Bipolar disorder ([place])   ? Coronary artery disease   ? Depression   ? Fatigue   ? Fibromyalgia   ? GERD (gastroesophageal reflux disease)   ? Heart disease   ? Heart failure ([place])   ? HLD (hyperlipidemia)   ? HOH (hard of hearing)   ?  Hypothyroidism   ? IBS (irritable bowel syndrome)   ? Pelvic pain in female   ? Perianal lesion   ? high grade sil lesion- keratinizing type  ? RA (rheumatoid arthritis) ([place])   ? Raynaud phenomenon   ? Sjogren's disease ([place])   ? Sleep apnea   ? Vaginal Pap smear, abnormal   ?  ?Past  Surgical History:  ?Procedure Laterality Date  ? CATARACT EXTRACTION W/PHACO Left 02/23/2017  ? Procedure: CATARACT EXTRACTION PHACO AND INTRAOCULAR LENS PLACEMENT (IOC);  Surgeon: Birder Robson, MD;  Location: [facility] ORS;  Service: Ophthalmology;  Laterality: Left;  [place] 00:35 ?AP% 9.6 ?CDE 3.47 ?Fluid pack lot # [id number]  ? CATARACT EXTRACTION W/PHACO Right 03/30/2017  ? Procedure: CATARACT EXTRACTION PHACO AND INTRAOCULAR LENS PLACEMENT (IOC);  Surgeon: Birder Robson, MD;  Location: [facility] ORS;  Service: Ophthalmology;  Laterality: Right;  [place] 00:32.6 ?AP% 13.8 ?CDE 4.51 ?Fluid Pack Lot # [id number]  ? EYE SURGERY    ? TONSILLECTOMY    ? ? ?Family Psychiatric History: Reviewed family psychiatric history from progress note on 06/17/2017. ? ?Family History:  ?Family History  ?Problem Relation Age of Onset  ? Heart failure Father   ? Parkinson's disease Mother   ? Bipolar disorder Mother   ? Depression Sister   ? Schizophrenia Paternal Grandmother   ? Diabetes Maternal Grandmother   ? Pancreatic cancer Maternal Grandmother   ? Cancer Neg Hx   ? Heart disease Neg Hx   ? Breast cancer Neg Hx   ? ? ?Social History: Reviewed social history from progress note on 06/17/2017. ?Social History  ? ?Socioeconomic History  ? Marital status: Divorced  ?  Spouse name: Not on file  ? Number of children: 2  ? Years of education: Not on file  ? Highest education level: Master's degree (e.g., MA, MS, MEng, MEd, MSW, MBA)  ?Occupational History  ?  Comment: retired2  ?Tobacco Use  ? Smoking status: Never  ? Smokeless tobacco: Former  ?Vaping Use  ? Vaping Use: Never used  ?Substance and Sexual Activity  ? Alcohol use: No  ?  Alcohol/week: 0.0 standard drinks  ? Drug use: No  ? Sexual activity: Never  ?  Birth control/protection: Post-menopausal  ?Other Topics Concern  ? Not on file  ?Social History Narrative  ? Not on file  ? ?Social Determinants of Health  ? ?Financial Resource Strain: Not on file  ?Food Insecurity: Not on file   ?Transportation Needs: Not on file  ?Physical Activity: Not on file  ?Stress: Not on file  ?Social Connections: Not on file  ? ? ?Allergies:  ?Allergies  ?Allergen Reactions  ? Amoxicillin-Pot Clavulanate Nausea Only and Other (See Comments)  ?  Diarrhea ?Has patient had a PCN reaction causing immediate rash, facial/tongue/throat swelling, SOB or lightheadedness with hypotension:  ?Has patient had a PCN reaction causing severe rash involving mucus membranes or skin necrosis: ?Has patient had a PCN reaction that required hospitalization:  ?Has patient had a PCN reaction occurring within the last 10 years: ?If all of the above answers are "NO", then may proceed with Cephalosporin use. ?  ? Oxycodone-Acetaminophen Nausea Only  ? Erythromycin Nausea Only  ? Azithromycin Nausea And Vomiting  ? Codeine   ? Macrolides And Ketolides   ? Oxycodone-Acetaminophen   ? Penicillins Other (See Comments)  ?  Has patient had a PCN reaction causing immediate rash, facial/tongue/throat swelling, SOB or lightheadedness with hypotension: NO ?Has patient had a PCN reaction causing  severe rash involving mucus membranes or skin necrosis: NO ?Has patient had a PCN reaction that required hospitalization: NO ?Has patient had a PCN reaction occurring within the last 10 years: NO ?If all of the above answers are "NO", then may proceed with Cephalosporin use. ?  ? Remeron [Mirtazapine]   ?  Side effect-likely tardive dyskinesia  ? Tramadol Nausea And Vomiting  ? Zolpidem   ?  Sleep walking  ? Lamotrigine Rash  ?  LAMICTAL  ? Sulfa Antibiotics Rash  ? ? ?Metabolic Disorder Labs: ?Lab Results  ?Component Value Date  ? HGBA1C 5.3 11/22/2017  ? ?Lab Results  ?Component Value Date  ? PROLACTIN 33.0 (H) 11/22/2017  ? ?Lab Results  ?Component Value Date  ? CHOL 222 (H) 01/02/2020  ? TRIG 190 (H) 01/02/2020  ? HDL 69 01/02/2020  ? CHOLHDL 3.2 01/02/2020  ? VLDL 38 01/02/2020  ? [place] 115 (H) 01/02/2020  ? LDLCALC 119 (H) 11/22/2017  ? ?Lab Results   ?Component Value Date  ? TSH 2.678 01/02/2020  ? ? ?Therapeutic Level Labs: ?No results found for: LITHIUM ?Lab Results  ?Component Value Date  ? VALPROATE 60 01/22/2021  ? VALPROATE 60 01/02/2020  ? ?No components foun

## 2021-07-21 NOTE — Telephone Encounter (Signed)
Front desk has been trying to reach this patient to schedule an appointment for today since this AM due to her recent concerns . ?It can be virtual , phone call or inperson , please let patient know and please let Scott know . Thank you . ?

## 2021-07-21 NOTE — Telephone Encounter (Signed)
pt called back sunday and states that she realized that she was stressed out because of her daughter and her grandchildren and that she forgot to take her medications.  she still wants to get a increase in her medications and she also needs something for  ?

## 2021-07-21 NOTE — Telephone Encounter (Signed)
pt called tearful. she states she needs some sleep she is having issues with her bipolar and her seasonal issues and some anxiety.  she wants to increase seroquel.  she states that the after hours number is not working.  ?

## 2021-07-24 ENCOUNTER — Telehealth: Payer: Self-pay

## 2021-07-24 NOTE — Telephone Encounter (Signed)
pt called and left a message that she is doing much better and to thank dr. Shea Evans for making changes and she doing good.

## 2021-07-24 NOTE — Telephone Encounter (Signed)
Thank you :)

## 2021-08-08 ENCOUNTER — Telehealth: Payer: Self-pay

## 2021-08-08 NOTE — Telephone Encounter (Signed)
Good to know

## 2021-08-08 NOTE — Telephone Encounter (Signed)
pt wanted to let you know that she is feelig alot better and wanted to say think you

## 2021-08-15 ENCOUNTER — Telehealth: Payer: Self-pay

## 2021-08-15 NOTE — Telephone Encounter (Signed)
Called pt, she states that she is having dysuria and frequent urination. Offered pt next available appt, however pt has another appt at that time. She states she will try [facility] walk-in clinic.

## 2021-08-21 ENCOUNTER — Telehealth: Payer: Self-pay

## 2021-08-21 NOTE — Telephone Encounter (Signed)
Medication management - Patient left a voice message she would like a call back to inform her of the results of her most recent ECG from 07/16/21.

## 2021-08-21 NOTE — Telephone Encounter (Signed)
Normal sinus rhythm . I would advice her to continue her psychotropics . But if she has more concerns she could talk to her primary care as needed.

## 2021-08-21 NOTE — Telephone Encounter (Signed)
Medication management - Telephone call with pt to inform Dr. Shea Evans reviewed her ECG from 07/16/21 and it returned with normal sinus rhythm and no prolonged Q-T waves,so patient could continue her medications prescribed by Dr. Shea Evans as currently written.  Patient stated understanding and no other concerns at this time.

## 2021-08-25 ENCOUNTER — Telehealth: Payer: Self-pay

## 2021-08-25 NOTE — Telephone Encounter (Signed)
pt was unsure of the appt for tomorrow. she stated that she didn't have it written down. she states that she doing well except she had a UTI and she feels weak.

## 2021-08-25 NOTE — Telephone Encounter (Signed)
Ok , so is she going to keep her appointment tomorrow ?

## 2021-08-26 ENCOUNTER — Encounter: Payer: Self-pay | Admitting: Psychiatry

## 2021-08-26 ENCOUNTER — Ambulatory Visit (INDEPENDENT_AMBULATORY_CARE_PROVIDER_SITE_OTHER): Payer: Medicare PPO | Admitting: Psychiatry

## 2021-08-26 VITALS — BP 131/88 | HR 88 | Temp 98.7°F | Wt 102.6 lb

## 2021-08-26 DIAGNOSIS — Z9189 Other specified personal risk factors, not elsewhere classified: Secondary | ICD-10-CM | POA: Diagnosis not present

## 2021-08-26 DIAGNOSIS — F411 Generalized anxiety disorder: Secondary | ICD-10-CM

## 2021-08-26 DIAGNOSIS — F3174 Bipolar disorder, in full remission, most recent episode manic: Secondary | ICD-10-CM

## 2021-08-26 DIAGNOSIS — F5101 Primary insomnia: Secondary | ICD-10-CM

## 2021-08-26 MED ORDER — QUETIAPINE FUMARATE ER 50 MG PO TB24: 100.0000 mg | ORAL_TABLET | Freq: Every day | ORAL | 0 refills | Status: DC

## 2021-08-26 NOTE — Progress Notes (Signed)
[facility] MD OP Progress Note  08/26/2021 1:57 PM Stephanie [name]  MRN:  [id number]  Chief Complaint:  Chief Complaint  Patient presents with   Follow-up: 76 year old Caucasian female who has a history of bipolar disorder generalized anxiety disorder, primary insomnia, multiple medical problems presented for medication management.   HPI: Stephanie [name] is a 20 year old Caucasian female, divorced, lives in [place], has a history of multiple medical problems as well as bipolar disorder, GAD, primary insomnia, TD, Raynaud's phenomenon, Sjogren's syndrome, hypothyroidism, coronary artery disease, IBS, hyperlipidemia, fibromyalgia, GERD was evaluated in office today.  Patient today reports she is overall doing well with regards to her mood on the higher dosage of Seroquel.  She does not have any hypomanic or manic symptoms and does not feel depressed at this time.  The Seroquel 300 mg seems to help.  Does report dry mouth however likely multifactorial including being on medications as well as history of Sjogren syndrome, rheumatoid arthritis.  Denies any significant anxiety or panic attacks.  Reports sleep as good.  Uses the temazepam only as needed.  Trying to limit use.  Reports she continues to be compliant with her other providers for her multiple other medical problems.  Patient appeared to be alert, oriented to person place time situation.  Denies any suicidality, homicidality or perceptual disturbances.  Continues to follow-up with therapist-[name].  Patient denies any other concerns today.  Visit Diagnosis:    ICD-10-CM   1. Bipolar disorder, in full remission, most recent episode manic ([place])  F31.74     2. GAD (generalized anxiety disorder)  F41.1     3. Primary insomnia  F51.01     4. At risk for prolonged QT interval syndrome  Z91.89 QUEtiapine (SEROQUEL XR) 50 MG TB24 24 hr tablet      Past Psychiatric History: Reviewed past psychiatric history from progress note on  06/17/2017.  Inpatient [facility] health admission at [facility]-01/02/2020 - 01/11/2020.  Past Medical History:  Past Medical History:  Diagnosis Date   Anginal pain ([place])    Anxiety    Bipolar 1 disorder ([place])    Bipolar disorder ([place])    Coronary artery disease    Depression    Fatigue    Fibromyalgia    GERD (gastroesophageal reflux disease)    Heart disease    Heart failure ([facility])    HLD (hyperlipidemia)    HOH (hard of hearing)    Hypothyroidism    IBS (irritable bowel syndrome)    Pelvic pain in female    Perianal lesion    high grade sil lesion- keratinizing type   RA (rheumatoid arthritis) ([place])    Raynaud phenomenon    Sjogren's disease ([place])    Sleep apnea    Vaginal Pap smear, abnormal     Past Surgical History:  Procedure Laterality Date   CATARACT EXTRACTION W/PHACO Left 02/23/2017   Procedure: CATARACT EXTRACTION PHACO AND INTRAOCULAR LENS PLACEMENT ([place]);  Surgeon: Birder Robson, MD;  Location: [facility] ORS;  Service: Ophthalmology;  Laterality: Left;  [place] 00:35 AP% 9.6 CDE 3.47 Fluid pack lot # [id number]   CATARACT EXTRACTION W/PHACO Right 03/30/2017   Procedure: CATARACT EXTRACTION PHACO AND INTRAOCULAR LENS PLACEMENT ([facility]);  Surgeon: Birder Robson, MD;  Location: [facility] ORS;  Service: Ophthalmology;  Laterality: Right;  [place] 00:32.6 AP% 13.8 CDE 4.51 Fluid Pack Lot # [id number]   EYE SURGERY     TONSILLECTOMY      Family Psychiatric History: Reviewed family  psychiatric history from progress note on 06/17/2017.  Family History:  Family History  Problem Relation Age of Onset   Heart failure Father    Parkinson's disease Mother    Bipolar disorder Mother    Depression Sister    Schizophrenia Paternal Grandmother    Diabetes Maternal Grandmother    Pancreatic cancer Maternal Grandmother    Cancer Neg Hx    Heart disease Neg Hx    Breast cancer Neg Hx     Social History: Reviewed social history from progress note on  06/17/2017. Social History   Socioeconomic History   Marital status: Divorced    Spouse name: Not on file   Number of children: 2   Years of education: Not on file   Highest education level: Master's degree (e.g., MA, MS, MEng, MEd, MSW, MBA)  Occupational History    Comment: retired2  Tobacco Use   Smoking status: Never   Smokeless tobacco: Former  Scientific laboratory technician Use: Never used  Substance and Sexual Activity   Alcohol use: No    Alcohol/week: 0.0 standard drinks of alcohol   Drug use: No   Sexual activity: Never    Birth control/protection: Post-menopausal  Other Topics Concern   Not on file  Social History Narrative   Not on file   Social Determinants of Health   Financial Resource Strain: Medium Risk (01/26/2017)   Overall Financial Resource Strain (CARDIA)    Difficulty of Paying Living Expenses: Somewhat hard  Food Insecurity: No Food Insecurity (01/26/2017)   Hunger Vital Sign    Worried About Running Out of Food in the Last Year: Never true    [place] in the Last Year: Never true  Transportation Needs: No Transportation Needs (01/26/2017)   PRAPARE - Hydrologist (Medical): No    Lack of Transportation (Non-Medical): No  Physical Activity: Inactive (01/26/2017)   Exercise Vital Sign    Days of Exercise per Week: 0 days    Minutes of Exercise per Session: 0 min  Stress: No Stress Concern Present (01/26/2017)   [place]    Feeling of Stress : Only a little  Social Connections: Unknown (01/26/2017)   Social Connection and Isolation Panel [NHANES]    Frequency of Communication with Friends and Family: Not on file    Frequency of Social Gatherings with Friends and Family: Not on file    Attends Religious Services: Never    Marine scientist or Organizations: No    Attends Archivist Meetings: Never    Marital Status: Divorced     Allergies:  Allergies  Allergen Reactions   Amoxicillin-Pot Clavulanate Nausea Only and Other (See Comments)    Diarrhea Has patient had a PCN reaction causing immediate rash, facial/tongue/throat swelling, SOB or lightheadedness with hypotension:  Has patient had a PCN reaction causing severe rash involving mucus membranes or skin necrosis: Has patient had a PCN reaction that required hospitalization:  Has patient had a PCN reaction occurring within the last 10 years: If all of the above answers are "NO", then may proceed with Cephalosporin use.    Oxycodone-Acetaminophen Nausea Only   Erythromycin Nausea Only   Azithromycin Nausea And Vomiting   Codeine    Macrolides And Ketolides    Oxycodone-Acetaminophen    Penicillins Other (See Comments)    Has patient had a PCN reaction causing immediate rash, facial/tongue/throat swelling, SOB or lightheadedness  with hypotension: NO Has patient had a PCN reaction causing severe rash involving mucus membranes or skin necrosis: NO Has patient had a PCN reaction that required hospitalization: NO Has patient had a PCN reaction occurring within the last 10 years: NO If all of the above answers are "NO", then may proceed with Cephalosporin use.    Remeron [Mirtazapine]     Side effect-likely tardive dyskinesia   Tramadol Nausea And Vomiting   Zolpidem     Sleep walking   Lamotrigine Rash    LAMICTAL   Sulfa Antibiotics Rash    Metabolic Disorder Labs: Lab Results  Component Value Date   HGBA1C 5.3 11/22/2017   Lab Results  Component Value Date   PROLACTIN 33.0 (H) 11/22/2017   Lab Results  Component Value Date   CHOL 222 (H) 01/02/2020   TRIG 190 (H) 01/02/2020   HDL 69 01/02/2020   CHOLHDL 3.2 01/02/2020   VLDL 38 01/02/2020   LDLCALC 115 (H) 01/02/2020   LDLCALC 119 (H) 11/22/2017   Lab Results  Component Value Date   TSH 2.678 01/02/2020    Therapeutic Level Labs: No results found for: "LITHIUM" Lab Results   Component Value Date   VALPROATE 60 01/22/2021   VALPROATE 60 01/02/2020   Lab Results  Component Value Date   CBMZ 7.3 01/22/2021   CBMZ 8.8 01/02/2020    Current Medications: Current Outpatient Medications  Medication Sig Dispense Refill   Acetaminophen 500 MG capsule Take 500 mg by mouth. Takes 500 mg bid prn     aspirin 81 MG chewable tablet Chew 81 mg by mouth daily with breakfast.     Calcium Citrate-Vitamin D 200-250 MG-UNIT TABS Take 1 tablet by mouth daily after breakfast.      carbamazepine (TEGRETOL XR) 200 MG 12 hr tablet TAKE 2 TABLETS BY MOUTH TWICE A DAY 360 tablet 1   divalproex (DEPAKOTE ER) 500 MG 24 hr tablet TAKE 1 TABLET BY MOUTH TWICE A DAY 180 tablet 1   estradiol (ESTRACE VAGINAL) 0.1 MG/GM vaginal cream Apply 0.'5mg'$  (pea-sized amount)  just inside the vaginal introitus with a finger-tip on Monday, Wednesday and Friday nights. 42.5 g 12   ezetimibe (ZETIA) 10 MG tablet Take 1 tablet by mouth daily.     fluticasone (FLONASE) 50 MCG/ACT nasal spray      folic acid (FOLVITE) 1 MG tablet Take 2 mg by mouth daily with breakfast.      gabapentin (NEURONTIN) 300 MG capsule TAKE 1 CAPSULE BY MOUTH TWICE DAILY 180 capsule 1   hypromellose (GENTEAL) 0.3 % GEL ophthalmic ointment Apply to eye.     ibandronate (BONIVA) 150 MG tablet every 30 (thirty) days.     levothyroxine (SYNTHROID, LEVOTHROID) 75 MCG tablet Take 75 mcg by mouth daily before breakfast.      methotrexate 2.5 MG tablet Take 15 mg by mouth every Wednesday. WEDNESDAYS AT LUNCH     Multiple Vitamins-Minerals (MULTIVITAMIN WITH MINERALS) tablet Take 1 tablet by mouth daily with breakfast.      MYRBETRIQ 50 MG TB24 tablet TAKE 1 TABLET BY MOUTH ONCE A DAY 30 tablet 11   nitroGLYCERIN (NITROSTAT) 0.4 MG SL tablet Place under the tongue.     pravastatin (PRAVACHOL) 80 MG tablet Take 1 tablet by mouth at bedtime.     Propylene Glycol 0.6 % SOLN Apply to eye. Takes 4 times a day     QUEtiapine (SEROQUEL XR) 300  MG 24 hr tablet Take 1 tablet (300 mg  total) by mouth at bedtime. 90 tablet 0   temazepam (RESTORIL) 7.5 MG capsule TAKE 1 CAPSULE BY MOUTH EVERY NIGHT AT BEDTIME 30 capsule 2   mometasone (ELOCON) 0.1 % cream Apply to rash on the left index finger once to twice daily until healed. Avoid applying to face, groin, and axilla. Use as directed. Long-term use can cause thinning of the skin. (Patient not taking: Reported on 08/26/2021) 15 g 0   pravastatin (PRAVACHOL) 40 MG tablet Take by mouth. (Patient not taking: Reported on 07/01/2021)     QUEtiapine (SEROQUEL XR) 50 MG TB24 24 hr tablet Take 2 tablets (100 mg total) by mouth at bedtime. Take along with 200 mg - total of 300 mg daily at bedtime 180 tablet 0   No current facility-administered medications for this visit.     Musculoskeletal:within normal limits Strength & Muscle Tone:  Gait & Station:  walks with cane Patient leans: N/A  Psychiatric Specialty Exam: Review of Systems  HENT:         Dry mouth  Psychiatric/Behavioral: Negative.    All other systems reviewed and are negative.   Blood pressure 131/88, pulse 88, temperature 98.7 F (37.1 C), temperature source Temporal, weight 102 lb 9.6 oz (46.5 kg).Body mass index is 21.08 kg/m.  General Appearance: Casual  Eye Contact:  Fair  Speech:  Clear and Coherent  Volume:  Normal  Mood:  Euthymic  Affect:  Congruent  Thought Process:  Goal Directed and Descriptions of Associations: Intact  Orientation:  Full (Time, Place, and Person)  Thought Content: Logical   Suicidal Thoughts:  No  Homicidal Thoughts:  No  Memory:  Immediate;   Fair Recent;   Fair Remote;   Fair  Judgement:  Fair  Insight:  Fair  Psychomotor Activity:  Normal  Concentration:  Concentration: Fair and Attention Span: Fair  Recall:  [employer] of Knowledge: Fair  Language: Fair  Akathisia:  No  Handed:  Right  AIMS (if indicated): done  Assets:  Communication Skills Desire for  Improvement Housing Social Support  ADL's:  Intact  Cognition: WNL  Sleep:  Fair   Screenings: [facility] Office Visit from 08/26/2021 in [facility] Office Visit from 07/01/2021 in [facility] Office Visit from 03/11/2021 in [facility] Visit from 07/17/2020 in [facility] Visit from 06/14/2020 in [facility] Total Score 0 0 0 0 0      AUDIT    Flowsheet Row Admission (Discharged) from 01/02/2020 in [facility]  Alcohol Use Disorder Identification Test Final Score (AUDIT) 0      GAD-7    Flowsheet Row Office Visit from 07/17/2020 in [facility]  Total GAD-7 Score 5      PHQ2-9    [place] Visit from 08/26/2021 in [facility] Office Visit from 07/01/2021 in [facility] Visit from 04/02/2021 in [facility] Visit from 03/11/2021 in [facility] Visit from 12/24/2020 in [facility]  PHQ-2 Total Score 0 0 2 2 0  PHQ-9 Total Score '5 5 5 6 '$ --      [place] Visit from 08/26/2021 in [facility] Visit from 07/01/2021 in [facility] Office Visit from 04/02/2021 in [facility] No Risk No Risk  No Risk        Assessment and Plan: Stephanie [name] is a 52 year old Caucasian female, divorced, lives in [place], has a history of bipolar disorder, multiple medical problems was evaluated in office today.  Patient is currently stable.  Plan Bipolar disorder in remission Seroquel extended release 300 mg p.o. nightly Tegretol extended release 400 mg p.o. twice daily Tegretol  level-01/22/2021-7.3-therapeutic Depakote extended release 500 mg p.o. twice daily Depakote level-01/22/2021-60-therapeutic Gabapentin 300 mg p.o. twice daily  Insomnia-stable Seroquel as prescribed. Temazepam 7.5 mg p.o. nightly as needed Reviewed [place] PMP aware  GAD-stable Gabapentin 300 mg p.o. twice daily Seroquel is prescribed Continue CBT with [name].  Patient with dry mouth likely due to being on multiple medications as well as has Sjogren's syndrome and rheumatoid arthritis.  Patient to follow up with her primary care provider.  I have coordinated care with [name].  Discussed patient's treatment plan.  She will continue to need CBT.  Follow-up in clinic in 3 months or sooner if needed.   This note was generated in part or whole with voice recognition software. Voice recognition is usually quite accurate but there are transcription errors that can and very often do occur. I apologize for any typographical errors that were not detected and corrected.   Ursula Alert, MD 08/26/2021, 1:57 PM

## 2021-09-01 ENCOUNTER — Other Ambulatory Visit: Payer: Self-pay | Admitting: Psychiatry

## 2021-09-01 DIAGNOSIS — F5101 Primary insomnia: Secondary | ICD-10-CM

## 2021-09-11 ENCOUNTER — Ambulatory Visit: Payer: Medicare PPO | Admitting: Dermatology

## 2021-09-11 DIAGNOSIS — L219 Seborrheic dermatitis, unspecified: Secondary | ICD-10-CM

## 2021-09-11 DIAGNOSIS — L299 Pruritus, unspecified: Secondary | ICD-10-CM | POA: Diagnosis not present

## 2021-09-11 MED ORDER — KETOCONAZOLE 2 % EX SHAM: MEDICATED_SHAMPOO | CUTANEOUS | 2 refills | Status: DC

## 2021-09-11 MED ORDER — MOMETASONE FUROATE 0.1 % EX CREA: TOPICAL_CREAM | CUTANEOUS | 2 refills | Status: DC

## 2021-09-11 NOTE — Progress Notes (Signed)
   Follow-Up Visit   Subjective  Stephanie [name] is a 76 y.o. female who presents for the following: Skin Problem (Itchy skin on her scalp and body, possible fleas x 3 days, pt report her cats have fleas). They are feral cats, outdoor only.  She doesn't not hold them.    The following portions of the chart were reviewed this encounter and updated as appropriate:       Review of Systems:  No other skin or systemic complaints except as noted in HPI or Assessment and Plan.  Objective  Well appearing patient in no apparent distress; mood and affect are within normal limits.  A focused examination was performed including face,scalp,arms,chest,back. Relevant physical exam findings are noted in the Assessment and Plan.  scalp Pink patches with greasy scale and flakes on the scalp   arms, back, chest Dry skin c/w xerosis, no rash    Assessment & Plan  Seborrheic dermatitis scalp  Chronic and persistent condition with duration or expected duration over one year. Condition is bothersome/symptomatic for patient. Currently flared.   Seborrheic Dermatitis  -  is a chronic persistent rash characterized by pinkness and scaling most commonly of the mid face but also can occur on the scalp (dandruff), ears; mid chest, mid back and groin.  It tends to be exacerbated by stress and cooler weather.  People who have neurologic disease may experience new onset or exacerbation of existing seborrheic dermatitis.  The condition is not curable but treatable and can be controlled.   Start Ketoconazole 2% shampoo apply once per week, massage into scalp and leave in for 10 minutes before rinsing out   Related Medications ketoconazole (NIZORAL) 2 % shampoo apply once per week, massage into scalp and leave in for 10 minutes before rinsing out  Pruritus arms, back, chest  secondary to Xerosis, no evidence of flea bites  Start Mometasone cream apply to affected skin body twice a day prn  itch  Recommend mild soap and moisturizing cream 1-2 times daily.  Gentle skin care handout provided.    mometasone (ELOCON) 0.1 % cream - arms, back, chest Apply to affected areas on the body twice a day     Return if symptoms worsen or fail to improve.  I, Marye Round, CMA, am acting as scribe for Brendolyn Patty, MD .   Documentation: I have reviewed the above documentation for accuracy and completeness, and I agree with the above.  Brendolyn Patty MD

## 2021-09-11 NOTE — Patient Instructions (Addendum)
Gentle Skin Care Guide  1. Bathe no more than once a day.  2. Avoid bathing in hot water  3. Use a mild soap like Dove, Vanicream, Cetaphil, CeraVe. Can use Lever 2000 or Cetaphil antibacterial soap  4. Use soap only where you need it. On most days, use it under your arms, between your legs, and on your feet. Let the water rinse other areas unless visibly dirty.  5. When you get out of the bath/shower, use a towel to gently blot your skin dry, don't rub it.  6. While your skin is still a little damp, apply a moisturizing cream such as Vanicream, CeraVe, Cetaphil, Eucerin, Sarna lotion or plain Vaseline Jelly. For hands apply Neutrogena [place] Hand Cream or Excipial Hand Cream.  7. Reapply moisturizer any time you start to itch or feel dry.  8. Sometimes using free and clear laundry detergents can be helpful. Fabric softener sheets should be avoided. Downy Free & Gentle liquid, or any liquid fabric softener that is free of dyes and perfumes, it acceptable to use  9. If your doctor has given you prescription creams you may apply moisturizers over them          Recommend OTC [place] Rapid Relief Anti-Itch cream (pramoxine + menthol), CeraVe Anti-itch cream or lotion (pramoxine), Sarna lotion (Original- menthol + camphor or Sensitive- pramoxine) or Eucerin 12 hour Itch Relief lotion (menthol) up to 3 times per day to areas on body that are itchy.        Due to recent changes in healthcare laws, you may see results of your pathology and/or laboratory studies on MyChart before the doctors have had a chance to review them. We understand that in some cases there may be results that are confusing or concerning to you. Please understand that not all results are received at the same time and often the doctors may need to interpret multiple results in order to provide you with the best plan of care or course of treatment. Therefore, we ask that you please give [place] 2 business days to  thoroughly review all your results before contacting the office for clarification. Should we see a critical lab result, you will be contacted sooner.   If You Need Anything After Your Visit  If you have any questions or concerns for your doctor, please call our main line at [phone] and press option 4 to reach your doctor's medical assistant. If no one answers, please leave a voicemail as directed and we will return your call as soon as possible. Messages left after 4 pm will be answered the following business day.   You may also send [place] a message via [place]. We typically respond to MyChart messages within 1-2 business days.  For prescription refills, please ask your pharmacy to contact our office. Our fax number is [phone].  If you have an urgent issue when the clinic is closed that cannot wait until the next business day, you can page your doctor at the number below.    Please note that while we do our best to be available for urgent issues outside of office hours, we are not available 24/7.   If you have an urgent issue and are unable to reach [place], you may choose to seek medical care at your doctor's office, retail clinic, urgent care center, or emergency room.  If you have a medical emergency, please immediately call 911 or go to the emergency department.  Pager Numbers  - Dr. Nehemiah Massed: [phone]  -  Dr. Laurence Ferrari: [phone]  - Dr. Nicole Kindred: [phone]  In the event of inclement weather, please call our main line at [phone] for an update on the status of any delays or closures.  Dermatology Medication Tips: Please keep the boxes that topical medications come in in order to help keep track of the instructions about where and how to use these. Pharmacies typically print the medication instructions only on the boxes and not directly on the medication tubes.   If your medication is too expensive, please contact our office at [phone] option 4 or send [place] a message  through [place].   We are unable to tell what your co-pay for medications will be in advance as this is different depending on your insurance coverage. However, we may be able to find a substitute medication at lower cost or fill out paperwork to get insurance to cover a needed medication.   If a prior authorization is required to get your medication covered by your insurance company, please allow [place] 1-2 business days to complete this process.  Drug prices often vary depending on where the prescription is filled and some pharmacies may offer cheaper prices.  The website www.goodrx.com contains coupons for medications through different pharmacies. The prices here do not account for what the cost may be with help from insurance (it may be cheaper with your insurance), but the website can give you the price if you did not use any insurance.  - You can print the associated coupon and take it with your prescription to the pharmacy.  - You may also stop by our office during regular business hours and pick up a GoodRx coupon card.  - If you need your prescription sent electronically to a different pharmacy, notify our office through [facility] or by phone at [phone] option 4.     Si Usted Necesita Algo Despus de Su Visita  Tambin puede enviarnos un mensaje a travs de Pharmacist, community. Por lo general respondemos a los mensajes de MyChart en el transcurso de 1 a 2 das hbiles.  Para renovar recetas, por favor pida a su farmacia que se ponga en contacto con nuestra oficina. Harland Dingwall de fax es [place] [phone].  Si tiene un asunto urgente cuando la clnica est cerrada y que no puede esperar hasta el siguiente da hbil, puede llamar/localizar a su doctor(a) al nmero que aparece a continuacin.   Por favor, tenga en cuenta que aunque hacemos todo lo posible para estar disponibles para asuntos urgentes fuera del horario de [place], no estamos disponibles las 24 horas del da, los 7 das de  la [place].   Si tiene un problema urgente y no puede comunicarse con nosotros, puede optar por buscar atencin mdica  en el consultorio de su doctor(a), en una clnica privada, en un centro de atencin urgente o en una sala de emergencias.  Si tiene Engineering geologist, por favor llame inmediatamente al 911 o vaya a la sala de emergencias.  Nmeros de bper  - Dr. Nehemiah Massed: [phone]  - Dra. Moye: [phone]  - Dra. Nicole Kindred: [phone]  En caso de inclemencias del [place], por favor llame a Johnsie Kindred principal al [phone] para una actualizacin sobre el [place] de cualquier retraso o cierre.  Consejos para la medicacin en dermatologa: Por favor, guarde las cajas en las que vienen los medicamentos de uso tpico para ayudarle a seguir las instrucciones sobre dnde y cmo usarlos. Las farmacias generalmente imprimen las instrucciones del medicamento slo en las cajas y  no directamente en los tubos del medicamento.   Si su medicamento es muy caro, por favor, pngase en contacto con Zigmund Daniel llamando al [phone] y presione la opcin 4 o envenos un mensaje a travs de Pharmacist, community.   No podemos decirle cul ser su copago por los medicamentos por adelantado ya que esto es diferente dependiendo de la cobertura de su seguro. Sin embargo, es posible que podamos encontrar un medicamento sustituto a Electrical engineer un formulario para que el seguro cubra el medicamento que se considera necesario.   Si se requiere una autorizacin previa para que su compaa de seguros [place] su medicamento, por favor permtanos de 1 a 2 das hbiles para completar este proceso.  Los precios de los medicamentos varan con frecuencia dependiendo del Environmental consultant de dnde se surte la receta y alguna farmacias pueden ofrecer precios ms baratos.  El sitio web www.goodrx.com tiene cupones para medicamentos de Airline pilot. Los precios aqu no tienen en cuenta lo que podra costar con la ayuda  del seguro (puede ser ms barato con su seguro), pero el sitio web puede darle el precio si no utiliz Research scientist (physical sciences).  - Puede imprimir el cupn correspondiente y llevarlo con su receta a la farmacia.  - Tambin puede pasar por nuestra oficina durante el horario de atencin regular y Charity fundraiser una tarjeta de cupones de GoodRx.  - Si necesita que su receta se enve electrnicamente a una farmacia diferente, informe a nuestra oficina a travs de MyChart de [place] o por telfono llamando al [phone] y presione la opcin 4.

## 2021-09-17 ENCOUNTER — Ambulatory Visit: Payer: Medicare PPO | Admitting: Psychiatry

## 2021-10-03 ENCOUNTER — Ambulatory Visit: Payer: Medicare PPO | Admitting: Urology

## 2021-10-08 NOTE — Progress Notes (Unsigned)
10/09/21 10:11 AM   Stephanie [name] 1945/05/27 [id number]  Referring provider:  Derinda Late, MD [phone] [address] [facility] [place],  [place] [address]  Urological history  1. OAB -contributing factors of age, vaginal atrophy and Seroquel -PVR 135 mL -Myrbetriq 50 mg daily   2. Urge incontinence -contributing factors of age, vaginal atrophy and Seroquel   3. Urethral caruncle -applying the vaginal estrogen cream three nights weekly   4. rUTI's -contributing factors of age, vaginal atrophy, incontinence, immunocompromised, family history (mother has a history of rUTI's) and IBS -documented positive urine cultures over the last year  E.coli 10/29/2020  E.coli 12/15/2020  E.coli 08/16/2021  Chief Complaint  Patient presents with   Follow-up   Over Active Bladder     HPI: Stephanie [name] is a 76 y.o.female who presents today for one year follow up.   She states she had a mechanical fall on 09/15/2021.  She did not sustain and major injuries.    She is taking the Myrbetriq 50 mg daily and applying the estrace vaginal cream three nights weekly.    She is drinking one small glass of cranberry juice.  She has also increased her water intake by trying to drink two bottles of water daily.  She also drinks some apple juice and Gatorade.   She has 1-7 daytime urinations, 1-2 nighttime urinations with a mild urge to urinate.  She has urinary leakage without awareness.  She is leaking 3 more times weekly.  She wears absorbent underwear daily.  She does not limit fluid intake.  She does engage in toilet mapping on certain occasions.  UA +1 leukocytes, 6-10 WBC's and few bacteria.  PVR 135 mL   PMH: Past Medical History:  Diagnosis Date   Anginal pain ([place])    Anxiety    Bipolar 1 disorder (HCC)    Bipolar disorder ([place])    Coronary artery disease    Depression    Fatigue    Fibromyalgia    GERD (gastroesophageal reflux disease)     Heart disease    Heart failure (HCC)    HLD (hyperlipidemia)    HOH (hard of hearing)    Hypothyroidism    IBS (irritable bowel syndrome)    Pelvic pain in female    Perianal lesion    high grade sil lesion- keratinizing type   RA (rheumatoid arthritis) ([place])    Raynaud phenomenon    Sjogren's disease ([place])    Sleep apnea    Vaginal Pap smear, abnormal     Surgical History: Past Surgical History:  Procedure Laterality Date   CATARACT EXTRACTION W/PHACO Left 02/23/2017   Procedure: CATARACT EXTRACTION PHACO AND INTRAOCULAR LENS PLACEMENT ([place]);  Surgeon: Birder Robson, MD;  Location: [facility] ORS;  Service: Ophthalmology;  Laterality: Left;  [place] 00:35 AP% 9.6 CDE 3.47 Fluid pack lot # [id number]   CATARACT EXTRACTION W/PHACO Right 03/30/2017   Procedure: CATARACT EXTRACTION PHACO AND INTRAOCULAR LENS PLACEMENT ([facility]);  Surgeon: Birder Robson, MD;  Location: [facility] ORS;  Service: Ophthalmology;  Laterality: Right;  [place] 00:32.6 AP% 13.8 CDE 4.51 Fluid Pack Lot # [id number]   EYE SURGERY     TONSILLECTOMY      Home Medications:  Allergies as of 10/09/2021       Reactions   Amoxicillin-pot Clavulanate Nausea Only, Other (See Comments)   Diarrhea Has patient had a PCN reaction causing immediate rash, facial/tongue/throat swelling, SOB or lightheadedness with hypotension:  Has  patient had a PCN reaction causing severe rash involving mucus membranes or skin necrosis: Has patient had a PCN reaction that required hospitalization:  Has patient had a PCN reaction occurring within the last 10 years: If all of the above answers are "NO", then may proceed with Cephalosporin use.   Oxycodone-acetaminophen Nausea Only   Erythromycin Nausea Only   Azithromycin Nausea And Vomiting   Codeine    Macrolides And Ketolides    Oxycodone-acetaminophen    Penicillins Other (See Comments)   Has patient had a PCN reaction causing immediate rash, facial/tongue/throat swelling, SOB or lightheadedness  with hypotension: NO Has patient had a PCN reaction causing severe rash involving mucus membranes or skin necrosis: NO Has patient had a PCN reaction that required hospitalization: NO Has patient had a PCN reaction occurring within the last 10 years: NO If all of the above answers are "NO", then may proceed with Cephalosporin use.   Remeron [mirtazapine]    Side effect-likely tardive dyskinesia   Tramadol Nausea And Vomiting   Zolpidem    Sleep walking   Lamotrigine Rash   LAMICTAL   Sulfa Antibiotics Rash        Medication List        Accurate as of October 09, 2021 10:11 AM. If you have any questions, ask your nurse or doctor.          Acetaminophen 500 MG capsule Take 500 mg by mouth. Takes 500 mg bid prn   aspirin 81 MG chewable tablet Chew 81 mg by mouth daily with breakfast.   Calcium Citrate-Vitamin D 200-250 MG-UNIT Tabs Take 1 tablet by mouth daily after breakfast.   carbamazepine 200 MG 12 hr tablet Commonly known as: TEGRETOL XR TAKE 2 TABLETS BY MOUTH TWICE A DAY   divalproex 500 MG 24 hr tablet Commonly known as: DEPAKOTE ER TAKE 1 TABLET BY MOUTH TWICE A DAY   estradiol 0.1 MG/GM vaginal cream Commonly known as: ESTRACE VAGINAL Apply 0.2m (pea-sized amount)  just inside the vaginal introitus with a finger-tip on Monday, Wednesday and Friday nights.   ezetimibe 10 MG tablet Commonly known as: ZETIA Take 1 tablet by mouth daily.   fluticasone 50 MCG/ACT nasal spray Commonly known as: FLONASE   folic acid 1 MG tablet Commonly known as: FOLVITE Take 2 mg by mouth daily with breakfast.   gabapentin 300 MG capsule Commonly known as: NEURONTIN TAKE 1 CAPSULE BY MOUTH TWICE DAILY   hypromellose 0.3 % Gel ophthalmic ointment Commonly known as: GENTEAL Apply to eye.   ibandronate 150 MG tablet Commonly known as: BONIVA every 30 (thirty) days.   ketoconazole 2 % shampoo Commonly known as: NIZORAL apply once per week, massage into scalp and  leave in for 10 minutes before rinsing out   levothyroxine 75 MCG tablet Commonly known as: SYNTHROID Take 75 mcg by mouth daily before breakfast.   methotrexate 2.5 MG tablet Take 15 mg by mouth every Wednesday. WEDNESDAYS AT LUNCH   mometasone 0.1 % cream Commonly known as: ELOCON Apply to rash on the left index finger once to twice daily until healed. Avoid applying to face, groin, and axilla. Use as directed. Long-term use can cause thinning of the skin.   mometasone 0.1 % cream Commonly known as: ELOCON Apply to affected areas on the body twice a day   multivitamin with minerals tablet Take 1 tablet by mouth daily with breakfast.   Myrbetriq 50 MG Tb24 tablet Generic drug: mirabegron ER TAKE 1 TABLET  BY MOUTH ONCE A DAY   nitroGLYCERIN 0.4 MG SL tablet Commonly known as: NITROSTAT Place under the tongue.   pravastatin 80 MG tablet Commonly known as: PRAVACHOL Take 1 tablet by mouth at bedtime.   pravastatin 40 MG tablet Commonly known as: PRAVACHOL Take by mouth.   Propylene Glycol 0.6 % Soln Apply to eye. Takes 4 times a day   QUEtiapine 300 MG 24 hr tablet Commonly known as: SEROquel XR Take 1 tablet (300 mg total) by mouth at bedtime.   QUEtiapine 50 MG Tb24 24 hr tablet Commonly known as: SEROQUEL XR Take 2 tablets (100 mg total) by mouth at bedtime. Take along with 200 mg - total of 300 mg daily at bedtime   temazepam 7.5 MG capsule Commonly known as: RESTORIL TAKE 1 CAPSULE BY MOUTH EVERY NIGHT AT BEDTIME        Allergies:  Allergies  Allergen Reactions   Amoxicillin-Pot Clavulanate Nausea Only and Other (See Comments)    Diarrhea Has patient had a PCN reaction causing immediate rash, facial/tongue/throat swelling, SOB or lightheadedness with hypotension:  Has patient had a PCN reaction causing severe rash involving mucus membranes or skin necrosis: Has patient had a PCN reaction that required hospitalization:  Has patient had a PCN reaction  occurring within the last 10 years: If all of the above answers are "NO", then may proceed with Cephalosporin use.    Oxycodone-Acetaminophen Nausea Only   Erythromycin Nausea Only   Azithromycin Nausea And Vomiting   Codeine    Macrolides And Ketolides    Oxycodone-Acetaminophen    Penicillins Other (See Comments)    Has patient had a PCN reaction causing immediate rash, facial/tongue/throat swelling, SOB or lightheadedness with hypotension: NO Has patient had a PCN reaction causing severe rash involving mucus membranes or skin necrosis: NO Has patient had a PCN reaction that required hospitalization: NO Has patient had a PCN reaction occurring within the last 10 years: NO If all of the above answers are "NO", then may proceed with Cephalosporin use.    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 She has quit using smokeless tobacco. She reports that she does not drink alcohol and does not use drugs.   Physical Exam: BP 125/81   Pulse 78   Ht '4\' 11"'  (1.499 m)   Wt 99 lb (44.9 kg)   BMI 20.00 kg/m   Constitutional:  Well nourished. Alert and oriented, No acute distress. HEENT: [place] AT, moist mucus membranes.  Trachea midline, no masses. Cardiovascular: No clubbing, cyanosis, or edema. Respiratory: Normal respiratory effort, no increased work of breathing. Neurologic: Grossly intact, no focal deficits, moving all 4  extremities. Psychiatric: Normal mood and affect.    Laboratory Data: WBC (White Blood Cell Count) 4.1 - 10.2 10^3/uL 4.3   RBC (Red Blood  Cell Count) 4.04 - 5.48 10^6/uL 3.94 Low    Hemoglobin 12.0 - 15.0 gm/dL 13.7   Hematocrit 35.0 - 47.0 % 42.1   MCV (Mean Corpuscular Volume) 80.0 - 100.0 fl 106.9 High    MCH (Mean Corpuscular Hemoglobin) 27.0 - 31.2 pg 34.8 High    MCHC (Mean Corpuscular Hemoglobin Concentration) 32.0 - 36.0 gm/dL 32.5   Platelet Count 150 - 450 10^3/uL 134 Low    RDW-CV (Red Cell Distribution Width) 11.6 - 14.8 % 13.2   MPV (Mean Platelet Volume) 9.4 - 12.4 fl 10.7   Neutrophils 1.50 - 7.80 10^3/uL 1.58   Lymphocytes 1.00 - 3.60 10^3/uL 1.82   Monocytes 0.00 - 1.50 10^3/uL 0.36   Eosinophils 0.00 - 0.55 10^3/uL 0.50   Basophils 0.00 - 0.09 10^3/uL 0.02   Neutrophil % 32.0 - 70.0 % 36.9   Lymphocyte % 10.0 - 50.0 % 42.5   Monocyte % 4.0 - 13.0 % 8.4   Eosinophil % 1.0 - 5.0 % 11.7 High    Basophil% 0.0 - 2.0 % 0.5   Immature Granulocyte % <=0.7 % 0.0   Immature Granulocyte Count <=0.06 10^3/L 0.00   Resulting Agency  [facility] - LAB   Specimen Collected: 08/19/21 10:18   Performed by: [facility] - LAB Last Resulted: 08/19/21 11:01  Received From: [facility]  Result Received: 08/21/21 13:44   Protein, Total 6.1 - 7.9 g/dL 5.9 Low    Albumin 3.5 - 4.8 g/dL 3.9   Bilirubin, Total 0.3 - 1.2 mg/dL 0.4   Bilirubin, Conjugated 0.00 - 0.20 mg/dL 0.09   Alk Phos (alkaline Phosphatase) 34 - 104 U/L 46   AST  8 - 39 U/L 26   ALT  5 - 38 U/L 16   Resulting [facility] - LAB   Specimen Collected: 08/19/21 10:18   Performed by: [facility] - LAB Last Resulted: 08/19/21 13:28  Received From: [facility]  Result Received: 08/21/21 13:44   Creatinine 0.6 - 1.1 mg/dL 0.6   Glomerular Filtration Rate (eGFR), MDRD Estimate >60 mL/min/1.73sq m 97   Resulting Agency  [facility] - LAB   Specimen Collected: 08/19/21 10:18   Performed by: [facility] - LAB Last Resulted: 08/19/21 13:28  Received From: [facility]  Result Received: 08/21/21 13:44   Urinalysis See Epic and HPI I have reviewed the labs.   Pertinent Imaging:  Most Recent  Scan Result 158m 10/09/21 09:29    Assessment & Plan:    1. rUTI's - asymptomatic at today's visit  2. Incontinence  -at goal with Myrbetriq 50 mg daily   3. Vaginal atrophy -discontinue the vaginal estrogen cream three nights weekly at this time as she continues to have a bloody discharge    4. Gross hematuria - she continues to have blood on her pads in the morning - we discussed that this puts her in the high risk category for hematuria and the indicated work up consists of CT urogram and cysto    - she is adamantly against undergoing any work up at this time for the blood in her urine  - I offered to prescribe her a Valium prior to the cysto, but she still refused - I also asked her to at least consider the CT as she has had CT in the past, but she still refused  - I discussed with her that blood in the urine  is concerning for GU malignancies and her best chance of detecting these types of cancers and hopefully catch them in their early stages giving her the best change of extending life - I also advised her to see Dr. Marcelline Mates again in case the bloody discharge is from her uterus - she agrees to this                     Zara Council, [facility]   [place] [address], [place], [place] [address] [phone]  I spent 30 minutes on the day of the encounter to include pre-visit record review, face-to-face time with the patient, and post-visit ordering of tests.

## 2021-10-09 ENCOUNTER — Ambulatory Visit: Payer: Medicare PPO | Admitting: Urology

## 2021-10-09 ENCOUNTER — Encounter: Payer: Self-pay | Admitting: Urology

## 2021-10-09 ENCOUNTER — Telehealth: Payer: Self-pay

## 2021-10-09 VITALS — BP 125/81 | HR 78 | Ht 59.0 in | Wt 99.0 lb

## 2021-10-09 DIAGNOSIS — N3281 Overactive bladder: Secondary | ICD-10-CM | POA: Diagnosis not present

## 2021-10-09 DIAGNOSIS — N3941 Urge incontinence: Secondary | ICD-10-CM | POA: Diagnosis not present

## 2021-10-09 DIAGNOSIS — R310 Gross hematuria: Secondary | ICD-10-CM

## 2021-10-09 DIAGNOSIS — Z87440 Personal history of urinary (tract) infections: Secondary | ICD-10-CM | POA: Diagnosis not present

## 2021-10-09 DIAGNOSIS — N362 Urethral caruncle: Secondary | ICD-10-CM

## 2021-10-09 DIAGNOSIS — N952 Postmenopausal atrophic vaginitis: Secondary | ICD-10-CM | POA: Diagnosis not present

## 2021-10-09 DIAGNOSIS — N950 Postmenopausal bleeding: Secondary | ICD-10-CM

## 2021-10-09 DIAGNOSIS — N390 Urinary tract infection, site not specified: Secondary | ICD-10-CM

## 2021-10-09 LAB — MICROSCOPIC EXAMINATION

## 2021-10-09 LAB — URINALYSIS, COMPLETE
Bilirubin, UA: NEGATIVE
Glucose, UA: NEGATIVE
Ketones, UA: NEGATIVE
Nitrite, UA: NEGATIVE
Protein,UA: NEGATIVE
RBC, UA: NEGATIVE
Specific Gravity, UA: 1.01 (ref 1.005–1.030)
Urobilinogen, Ur: 0.2 mg/dL (ref 0.2–1.0)
pH, UA: 7.5 (ref 5.0–7.5)

## 2021-10-09 LAB — BLADDER SCAN AMB NON-IMAGING

## 2021-10-09 NOTE — Patient Instructions (Signed)
There are a number of causes that can be associated with blood in the urine, such as stones, UTI's, damage to the urinary tract and/or cancer. At this time, they are in a high risk stratification for hematuria per AUA guidelines due to their age (>60 years) and gross heme  The recommended studies for high risk are CT urogram and cysto Following the imaging study,  I've recommended a cystoscopy. I described how this is performed, typically in an office setting with a flexible cystoscope. We described the risks, benefits, and possible side effects, the most common of which is a minor amount of blood in the urine and/or burning which usually resolves in 24 to 48 hours.   The patient had the opportunity to ask questions which were answered. Based upon this discussion, the patient is willing to proceed. Therefore, I've ordered: a CT Urogram and cystoscopy.

## 2021-10-09 NOTE — Telephone Encounter (Signed)
Pt LM on triage stating that she wants [place]rene [place] to be informed that she is not going to proceed with CT and cysto. She states that it is her body and she will not have these test performed.

## 2021-10-13 ENCOUNTER — Telehealth: Payer: Self-pay | Admitting: Obstetrics and Gynecology

## 2021-10-13 NOTE — Telephone Encounter (Signed)
Patient called and states that she is going to keep her appointment on Aug 29th since her [employer] transportation has already approved this day.

## 2021-10-13 NOTE — Telephone Encounter (Signed)
Patient called and states that nurse wanted her to be seen sooner. Offered her the earliest appointment being august 22nd at 8:45am, Pt states her transportation bus does not have that time available and she does not want an afternoon appointment. Pt states she has a doctors appointment on the 24th of august and wouldn't be able to make that day either. Patient then states she wanted to speak with a nurse to discuss her symptoms as she thinks the appointment she has now on aug 29th would be best suited for her and transportation. Pt would like to give her symptoms to a nurse.

## 2021-10-15 ENCOUNTER — Telehealth (INDEPENDENT_AMBULATORY_CARE_PROVIDER_SITE_OTHER): Payer: Medicare PPO | Admitting: Psychiatry

## 2021-10-15 ENCOUNTER — Telehealth: Payer: Self-pay

## 2021-10-15 ENCOUNTER — Encounter: Payer: Self-pay | Admitting: Psychiatry

## 2021-10-15 DIAGNOSIS — Z79899 Other long term (current) drug therapy: Secondary | ICD-10-CM | POA: Diagnosis not present

## 2021-10-15 DIAGNOSIS — F411 Generalized anxiety disorder: Secondary | ICD-10-CM | POA: Diagnosis not present

## 2021-10-15 DIAGNOSIS — R58 Hemorrhage, not elsewhere classified: Secondary | ICD-10-CM

## 2021-10-15 DIAGNOSIS — D696 Thrombocytopenia, unspecified: Secondary | ICD-10-CM | POA: Insufficient documentation

## 2021-10-15 DIAGNOSIS — F5101 Primary insomnia: Secondary | ICD-10-CM | POA: Diagnosis not present

## 2021-10-15 DIAGNOSIS — F3174 Bipolar disorder, in full remission, most recent episode manic: Secondary | ICD-10-CM | POA: Diagnosis not present

## 2021-10-15 MED ORDER — QUETIAPINE FUMARATE ER 50 MG PO TB24: 50.0000 mg | ORAL_TABLET | Freq: Every day | ORAL | 1 refills | Status: DC

## 2021-10-15 NOTE — Telephone Encounter (Signed)
Please contact patient and schedule a phone appointment today after noon , I think I have an open slot at 3 PM - 30 minutes for medication management.

## 2021-10-15 NOTE — Telephone Encounter (Signed)
Pt was made an appt for today. Note closed.

## 2021-10-15 NOTE — Progress Notes (Unsigned)
Virtual Visit via Telephone Note  I connected with Stephanie [name] on 10/15/21 at  3:00 PM EDT by telephone and verified that I am speaking with the correct person using two identifiers.  Location Provider Location : [facility] Patient Location : Home  Participants: Patient , Provider   I discussed the limitations, risks, security and privacy concerns of performing an evaluation and management service by telephone and the availability of in person appointments. I also discussed with the patient that there may be a patient responsible charge related to this service. The patient expressed understanding and agreed to proceed.   I discussed the assessment and treatment plan with the patient. The patient was provided an opportunity to ask questions and all were answered. The patient agreed with the plan and demonstrated an understanding of the instructions.   The patient was advised to call back or seek an in-person evaluation if the symptoms worsen or if the condition fails to improve as anticipated.   [place] MD OP Progress Note  10/15/2021 4:20 PM Stephanie [name]  MRN:  [id number]  Chief Complaint:  Chief Complaint  Patient presents with   Follow-up: 76 year old Caucasian female with history of bipolar disorder, generalized anxiety disorder, primary insomnia, multiple medical problems presented for medication management.   HPI: Stephanie [name] is a 76 year old Caucasian female, divorced, lives in [place], has a history of multiple medical problems as well as bipolar disorder, GAD, primary insomnia, TD, Raynaud's phenomena, Sjogren syndrome, hypothyroidism, coronary artery disease, IBS, hyperlipidemia, fibromyalgia, GERD was evaluated by phone today.  Patient today reports she is currently anxious about the fact that she was advised by urology that she will need a CT urogram and cystoscopy.  She continues to have gross hematuria, reviewed notes per [name] PA -dated 10/09/2021-she was continued on  Myrbetriq 50 mg daily for incontinence, since she continues to have blood on her pads in the morning, CT urogram and cystoscopy was recommended.  Patient also advised to follow-up with Dr. Marcelline Mates in case it is a bloody discharge from her uterus."   Patient today reports  the fact that she needs to get this testing done makes her very anxious.  Patient reports she also feels depressed since she is going through these medical problems.  Reports she also had a fall recently, not sure what triggered it, this was early July.  She currently denies any residual symptoms from the fall.  Does have vertigo and that is worrisome for her.  Patient denies any suicidality, homicidality or perceptual disturbances.  Has upcoming appointment with [name] tomorrow, motivated to stay in therapy.  Reports sleep is overall good as long as she takes her Seroquel.  Currently limiting the amount of temazepam.    Visit Diagnosis:    ICD-10-CM   1. Bipolar disorder, in full remission, most recent episode manic ([place])  F31.74     2. GAD (generalized anxiety disorder)  F41.1     3. Primary insomnia  F51.01     4. High risk medication use  Z79.899 Carbamazepine level, total    Valproic acid level    CBC With Diff/Platelet      Past Psychiatric History: Reviewed past psychiatric history from progress note on 06/17/2017.  Inpatient mental health admission and [facility]-01/02/2020 - 01/11/2020.  Past Medical History:  Past Medical History:  Diagnosis Date   Anginal pain ([place])    Anxiety    Bipolar 1 disorder ([place])    Bipolar disorder (  [place])    Coronary artery disease    Depression    Fatigue    Fibromyalgia    GERD (gastroesophageal reflux disease)    Heart disease    Heart failure (H[facility])    HLD (hyperlipidemia)    HOH (hard of hearing)    Hypothyroidism    IBS (irritable bowel syndrome)    Pelvic pain in female    Perianal lesion    high grade sil lesion- keratinizing  type   RA (rheumatoid arthritis) ([place])    Raynaud phenomenon    Sjogren's disease ([place])    Sleep apnea    Vaginal Pap smear, abnormal     Past Surgical History:  Procedure Laterality Date   CATARACT EXTRACTION W/PHACO Left 02/23/2017   Procedure: CATARACT EXTRACTION PHACO AND INTRAOCULAR LENS PLACEMENT ([place]);  Surgeon: Birder Robson, MD;  Location: [facility] ORS;  Service: Ophthalmology;  Laterality: Left;  [place] 00:35 AP% 9.6 CDE 3.47 Fluid pack lot # [id number]   CATARACT EXTRACTION W/PHACO Right 03/30/2017   Procedure: CATARACT EXTRACTION PHACO AND INTRAOCULAR LENS PLACEMENT ([facility]);  Surgeon: Birder Robson, MD;  Location: [facility] ORS;  Service: Ophthalmology;  Laterality: Right;  [place] 00:32.6 AP% 13.8 CDE 4.51 Fluid Pack Lot # [id number]   EYE SURGERY     TONSILLECTOMY      Family Psychiatric History: Reviewed family psychiatric history from progress note on 06/17/2017.  Family History:  Family History  Problem Relation Age of Onset   Heart failure Father    Parkinson's disease Mother    Bipolar disorder Mother    Depression Sister    Schizophrenia Paternal Grandmother    Diabetes Maternal Grandmother    Pancreatic cancer Maternal Grandmother    Cancer Neg Hx    Heart disease Neg Hx    Breast cancer Neg Hx     Social History: Reviewed social history from progress note on 06/17/2017. Social History   Socioeconomic History   Marital status: Divorced    Spouse name: Not on file   Number of children: 2   Years of education: Not on file   Highest education level: Master's degree (e.g., MA, MS, MEng, MEd, MSW, MBA)  Occupational History    Comment: retired2  Tobacco Use   Smoking status: Never   Smokeless tobacco: Former  Scientific laboratory technician Use: Never used  Substance and Sexual Activity   Alcohol use: No    Alcohol/week: 0.0 standard drinks of alcohol   Drug use: No   Sexual activity: Never    Birth control/protection: Post-menopausal  Other Topics Concern   Not on  file  Social History Narrative   Not on file   Social Determinants of Health   Financial Resource Strain: Medium Risk (01/26/2017)   Overall Financial Resource Strain (CARDIA)    Difficulty of Paying Living Expenses: Somewhat hard  Food Insecurity: No Food Insecurity (01/26/2017)   Hunger Vital Sign    Worried About Running Out of Food in the Last Year: Never true    [place] in the Last Year: Never true  Transportation Needs: No Transportation Needs (01/26/2017)   PRAPARE - Hydrologist (Medical): No    Lack of Transportation (Non-Medical): No  Physical Activity: Inactive (01/26/2017)   Exercise Vital Sign    Days of Exercise per Week: 0 days    Minutes of Exercise per Session: 0 min  Stress: No Stress Concern Present (01/26/2017)   [employer] -  Occupational Stress Questionnaire    Feeling of Stress : Only a little  Social Connections: Unknown (01/26/2017)   Social Connection and Isolation Panel [NHANES]    Frequency of Communication with Friends and Family: Not on file    Frequency of Social Gatherings with Friends and Family: Not on file    Attends Religious Services: Never    Marine scientist or Organizations: No    Attends Archivist Meetings: Never    Marital Status: Divorced    Allergies:  Allergies  Allergen Reactions   Amoxicillin-Pot Clavulanate Nausea Only and Other (See Comments)    Diarrhea Has patient had a PCN reaction causing immediate rash, facial/tongue/throat swelling, SOB or lightheadedness with hypotension:  Has patient had a PCN reaction causing severe rash involving mucus membranes or skin necrosis: Has patient had a PCN reaction that required hospitalization:  Has patient had a PCN reaction occurring within the last 10 years: If all of the above answers are "NO", then may proceed with Cephalosporin use.    Oxycodone-Acetaminophen Nausea Only   Erythromycin Nausea Only    Azithromycin Nausea And Vomiting   Codeine    Macrolides And Ketolides    Oxycodone-Acetaminophen    Penicillins Other (See Comments)    Has patient had a PCN reaction causing immediate rash, facial/tongue/throat swelling, SOB or lightheadedness with hypotension: NO Has patient had a PCN reaction causing severe rash involving mucus membranes or skin necrosis: NO Has patient had a PCN reaction that required hospitalization: NO Has patient had a PCN reaction occurring within the last 10 years: NO If all of the above answers are "NO", then may proceed with Cephalosporin use.    Remeron [Mirtazapine]     Side effect-likely tardive dyskinesia   Tramadol Nausea And Vomiting   Zolpidem     Sleep walking   Lamotrigine Rash    LAMICTAL   Sulfa Antibiotics Rash    Metabolic Disorder Labs: Lab Results  Component Value Date   HGBA1C 5.3 11/22/2017   Lab Results  Component Value Date   PROLACTIN 33.0 (H) 11/22/2017   Lab Results  Component Value Date   CHOL 222 (H) 01/02/2020   TRIG 190 (H) 01/02/2020   HDL 69 01/02/2020   CHOLHDL 3.2 01/02/2020   VLDL 38 01/02/2020   LDLCALC 115 (H) 01/02/2020   LDLCALC 119 (H) 11/22/2017   Lab Results  Component Value Date   TSH 2.678 01/02/2020    Therapeutic Level Labs: No results found for: "LITHIUM" Lab Results  Component Value Date   VALPROATE 60 01/22/2021   VALPROATE 60 01/02/2020   Lab Results  Component Value Date   CBMZ 7.3 01/22/2021   CBMZ 8.8 01/02/2020    Current Medications: Current Outpatient Medications  Medication Sig Dispense Refill   Acetaminophen 500 MG capsule Take 500 mg by mouth. Takes 500 mg bid prn     aspirin 81 MG chewable tablet Chew 81 mg by mouth daily with breakfast.     Calcium Citrate-Vitamin D 200-250 MG-UNIT TABS Take 1 tablet by mouth daily after breakfast.      carbamazepine (TEGRETOL XR) 200 MG 12 hr tablet TAKE 2 TABLETS BY MOUTH TWICE A DAY 360 tablet 1   divalproex (DEPAKOTE ER) 500 MG  24 hr tablet TAKE 1 TABLET BY MOUTH TWICE A DAY 180 tablet 1   estradiol (ESTRACE VAGINAL) 0.1 MG/GM vaginal cream Apply 0.'5mg'$  (pea-sized amount)  just inside the vaginal introitus with a finger-tip on Monday, Wednesday and  Friday nights. 42.5 g 12   ezetimibe (ZETIA) 10 MG tablet Take 1 tablet by mouth daily.     fluticasone (FLONASE) 50 MCG/ACT nasal spray      folic acid (FOLVITE) 1 MG tablet Take 2 mg by mouth daily with breakfast.      gabapentin (NEURONTIN) 300 MG capsule TAKE 1 CAPSULE BY MOUTH TWICE DAILY 180 capsule 1   hypromellose (GENTEAL) 0.3 % GEL ophthalmic ointment Apply to eye.     ibandronate (BONIVA) 150 MG tablet every 30 (thirty) days.     ketoconazole (NIZORAL) 2 % shampoo apply once per week, massage into scalp and leave in for 10 minutes before rinsing out 120 mL 2   levothyroxine (SYNTHROID, LEVOTHROID) 75 MCG tablet Take 75 mcg by mouth daily before breakfast.      methotrexate 2.5 MG tablet Take 15 mg by mouth every Wednesday. WEDNESDAYS AT LUNCH     mometasone (ELOCON) 0.1 % cream Apply to rash on the left index finger once to twice daily until healed. Avoid applying to face, groin, and axilla. Use as directed. Long-term use can cause thinning of the skin. (Patient not taking: Reported on 08/26/2021) 15 g 0   mometasone (ELOCON) 0.1 % cream Apply to affected areas on the body twice a day 45 g 2   Multiple Vitamins-Minerals (MULTIVITAMIN WITH MINERALS) tablet Take 1 tablet by mouth daily with breakfast.      MYRBETRIQ 50 MG TB24 tablet TAKE 1 TABLET BY MOUTH ONCE A DAY 30 tablet 11   nitroGLYCERIN (NITROSTAT) 0.4 MG SL tablet Place under the tongue.     pravastatin (PRAVACHOL) 40 MG tablet Take by mouth. (Patient not taking: Reported on 07/01/2021)     pravastatin (PRAVACHOL) 80 MG tablet Take 1 tablet by mouth at bedtime.     Propylene Glycol 0.6 % SOLN Apply to eye. Takes 4 times a day     QUEtiapine (SEROQUEL XR) 300 MG 24 hr tablet Take 1 tablet (300 mg total) by mouth  at bedtime. 90 tablet 0   temazepam (RESTORIL) 7.5 MG capsule TAKE 1 CAPSULE BY MOUTH EVERY NIGHT AT BEDTIME 30 capsule 1   No current facility-administered medications for this visit.     Musculoskeletal: Strength & Muscle Tone:  UTA Gait & Station:  UTA Patient leans: N/A  Psychiatric Specialty Exam: Review of Systems  Psychiatric/Behavioral:  Positive for dysphoric mood. The patient is nervous/anxious.   All other systems reviewed and are negative.   There were no vitals taken for this visit.There is no height or weight on file to calculate BMI.  General Appearance:  UTA  Eye Contact:   UTA  Speech:  Normal Rate  Volume:  Normal  Mood:  Anxious and Depressed  Affect:   UTA  Thought Process:  Goal Directed and Descriptions of Associations: Intact  Orientation:  Full (Time, Place, and Person)  Thought Content: Logical   Suicidal Thoughts:  No  Homicidal Thoughts:  No  Memory:  Immediate;   Fair Recent;   Fair Remote;   Fair  Judgement:  Fair  Insight:  Fair  Psychomotor Activity:  Normal  Concentration:  Concentration: Fair and Attention Span: Fair  Recall:  [employer] of Knowledge: Fair  Language: Fair  Akathisia:  No  Handed:  Right  AIMS (if indicated): done  Assets:  Communication Skills Desire for Improvement Housing Resilience Social Support Transportation  ADL's:  Intact  Cognition: WNL  Sleep:  Fair   Screenings: AIMS  [place] Visit from 08/26/2021 in [place] [facility] Office Visit from 07/01/2021 in [place] Office Visit from 03/11/2021 in [place] Office Visit from 07/17/2020 in [place] Visit from 06/14/2020 in [facility] Total Score 0 0 0 0 0      AUDIT    Flowsheet Row Admission (Discharged) from 01/02/2020 in [facility]  Alcohol Use Disorder  Identification Test Final Score (AUDIT) 0      GAD-7    Flowsheet Row Office Visit from 07/17/2020 in [facility]  Total GAD-7 Score 5      PHQ2-9    [place] Office Visit from 08/26/2021 in [place] Office Visit from 07/01/2021 in [facility] Office Visit from 04/02/2021 in [facility] Office Visit from 03/11/2021 in [facility] Visit from 12/24/2020 in [place]  PHQ-2 Total Score 0 0 2 2 0  PHQ-9 Total Score '5 5 5 6 '$ --      [place] Visit from 08/26/2021 in [place] Office Visit from 07/01/2021 in [facility] Office Visit from 04/02/2021 in [facility] No Risk No Risk No Risk        Assessment and Plan: Stephanie [name] is a 26 year old Caucasian female, divorced, lives in [place], has a history of bipolar disorder, multiple medical problems was evaluated by phone today.  Patient is currently struggling with anxiety more so because of her upcoming manage for medical workup for her gross hematuria.  Patient will benefit from the following plan.  Plan Bipolar disorder in remission Seroquel extended release as prescribed Tegretol extended release 400 mg p.o. twice daily Tegretol level on 01/22/2021-7.3-therapeutic. Depakote extended release 500 mg p.o. twice daily Depakote level-01/22/2021-60-therapeutic Gabapentin 300 mg p.o. twice daily   Insomnia-stable Seroquel extended release as prescribed. Temazepam 7.5 mg p.o. nightly as needed Reviewed [place] PMP aware  GAD-unstable Patient advised to have more frequent sessions with [name], anxiety likely situational. In the meantime will readjust her Seroquel extended release to 350 mg p.o. nightly.  I have sent an extra 50  mg dose to her pharmacy. Continue gabapentin 300 mg p.o. twice daily  High risk medication use-patient with recent fall as well as genitourinary bleeding-patient is on medications like Depakote and carbamazepine which could increase her bleeding risk-will repeat labs including Depakote level, carbamazepine level as well as CBC with differential. Patient does have a history of low platelet count although chronic, it is worth repeating levels to monitor. Hepatic function panel-08/19/2021-reviewed-within normal limits.  Patient did go to [facility] order is in the system.  Follow-up in clinic in 4 weeks or sooner if needed.   I have spent at least 35 minutes non face to face with patient today.   Consent: Patient/Guardian gives verbal consent for treatment and assignment of benefits for services provided during this visit. Patient/Guardian expressed understanding and agreed to proceed.   This note was generated in part or whole with voice recognition software. Voice recognition is usually quite accurate but there are transcription errors that can and very often do occur. I apologize for any typographical errors that were not detected and corrected.      Ursula Alert, MD 10/15/2021, 4:20 PM

## 2021-10-15 NOTE — Telephone Encounter (Signed)
pt called left a message that she is having increase depression . she wanted to know if she can increase her seroquel

## 2021-10-16 ENCOUNTER — Other Ambulatory Visit: Payer: Self-pay | Admitting: Psychiatry

## 2021-10-16 DIAGNOSIS — R58 Hemorrhage, not elsewhere classified: Secondary | ICD-10-CM | POA: Insufficient documentation

## 2021-10-17 ENCOUNTER — Other Ambulatory Visit: Payer: Self-pay | Admitting: Psychiatry

## 2021-10-17 ENCOUNTER — Telehealth: Payer: Self-pay | Admitting: Psychiatry

## 2021-10-17 DIAGNOSIS — F3174 Bipolar disorder, in full remission, most recent episode manic: Secondary | ICD-10-CM

## 2021-10-17 LAB — CBC WITH DIFF/PLATELET
Basophils Absolute: 0 10*3/uL (ref 0.0–0.2)
Basos: 1 %
EOS (ABSOLUTE): 0.2 10*3/uL (ref 0.0–0.4)
Eos: 4 %
Hematocrit: 42 % (ref 34.0–46.6)
Hemoglobin: 14.6 g/dL (ref 11.1–15.9)
Immature Grans (Abs): 0 10*3/uL (ref 0.0–0.1)
Immature Granulocytes: 0 %
Lymphocytes Absolute: 1.9 10*3/uL (ref 0.7–3.1)
Lymphs: 46 %
MCH: 35.8 pg — ABNORMAL HIGH (ref 26.6–33.0)
MCHC: 34.8 g/dL (ref 31.5–35.7)
MCV: 103 fL — ABNORMAL HIGH (ref 79–97)
Monocytes Absolute: 0.4 10*3/uL (ref 0.1–0.9)
Monocytes: 9 %
Neutrophils Absolute: 1.6 10*3/uL (ref 1.4–7.0)
Neutrophils: 40 %
Platelets: 138 10*3/uL — ABNORMAL LOW (ref 150–450)
RBC: 4.08 x10E6/uL (ref 3.77–5.28)
RDW: 13.2 % (ref 11.7–15.4)
WBC: 4 10*3/uL (ref 3.4–10.8)

## 2021-10-17 LAB — VALPROIC ACID LEVEL: Valproic Acid Lvl: 90 ug/mL (ref 50–100)

## 2021-10-17 LAB — CARBAMAZEPINE LEVEL, TOTAL: Carbamazepine (Tegretol), S: 7.4 ug/mL (ref 4.0–12.0)

## 2021-10-17 NOTE — Telephone Encounter (Signed)
Contacted patient to discuss labs resulted - 10/17/2021  CBC with diff - RBC - 4.0 - improved compared to June 2023. MCV - - 103 - high - chronic MCH - 35.8 - high - chronic  Platelets - 138 - low - chronic - improving compared to June 2023.   Carbamazepine level - 7.4 - therapeutic   Depakote level - 90 - therapeutic.  Patient to continue current medications.  Denies any concerns today.    Will route this to [facility] - primary care.

## 2021-10-27 ENCOUNTER — Telehealth: Payer: Self-pay | Admitting: Psychiatry

## 2021-10-27 ENCOUNTER — Telehealth: Payer: Self-pay

## 2021-10-27 DIAGNOSIS — Z9189 Other specified personal risk factors, not elsewhere classified: Secondary | ICD-10-CM

## 2021-10-27 DIAGNOSIS — F411 Generalized anxiety disorder: Secondary | ICD-10-CM

## 2021-10-27 DIAGNOSIS — F3174 Bipolar disorder, in full remission, most recent episode manic: Secondary | ICD-10-CM

## 2021-10-27 NOTE — Telephone Encounter (Signed)
Pt left message on nurse line to refill Seroquil. Called back 10-27-21 stating she wants to receive care with [facility] coming inside home to help her. Will still come to future appointment in Sept.   Please call to advise the mg of Seroquil.

## 2021-10-27 NOTE — Telephone Encounter (Signed)
pt called again about increasing her medication .

## 2021-10-27 NOTE — Telephone Encounter (Signed)
Returned call to patient.  She reports she was not doing well with regards to her mood because she needed a lot of support and there was no one to offer her back.  She however was able to get home instead-she has completed the application and is currently awaiting them to start working.  That has helped a lot with her mood.  She reports she may have lost a few pounds according to her infusion nurse.  Per review of medical records she may have lost 3 pounds since her last visit in June.  Patient reports she is planning to supplement with protein shakes.  She also has primary care provider appointment coming up this Thursday.  Agrees to talk to them.  Patient reports she currently does not have any suicidality, perceptual disturbances.  Reports sleep is okay.  Currently compliant on medications.  Has upcoming appointment with [name] on Ol[place] and [place] to stay in therapy which has been beneficial.  Agrees to call us back for an in person visit if anything changes and she needs more medication readjustment.  Discussed with patient that she will also need a repeat EKG if Seroquel needs to be readjusted.  Agrees with plan.

## 2021-10-27 NOTE — Telephone Encounter (Signed)
Please let patient know I am currently with patients and will get back to her soon.

## 2021-10-27 NOTE — Telephone Encounter (Signed)
pt called again asking for a increase in her dosage of her seroquel

## 2021-10-27 NOTE — Telephone Encounter (Signed)
pt called on sunday and left a message that she needed a increase in her seroquel she states she is not doing well.

## 2021-10-28 ENCOUNTER — Encounter: Payer: Medicare PPO | Admitting: Obstetrics and Gynecology

## 2021-10-28 IMAGING — CT CT CERVICAL SPINE W/O CM
3 of 4 series · 12 of 33 positions shown, 14 images · non-contrast
Comparison: 07/31/2019

CLINICAL DATA: Follow-up C3 fracture.

EXAM:
CT CERVICAL SPINE WITHOUT CONTRAST
TECHNIQUE: Multidetector CT imaging of the cervical spine was performed without
intravenous contrast. Multiplanar CT image reconstructions were also
generated.

[Series 6: sagittal bone · sagittal · 0.23mm/px · 5 of 63 slices shown, 6 images]
[im 21/63  bone]
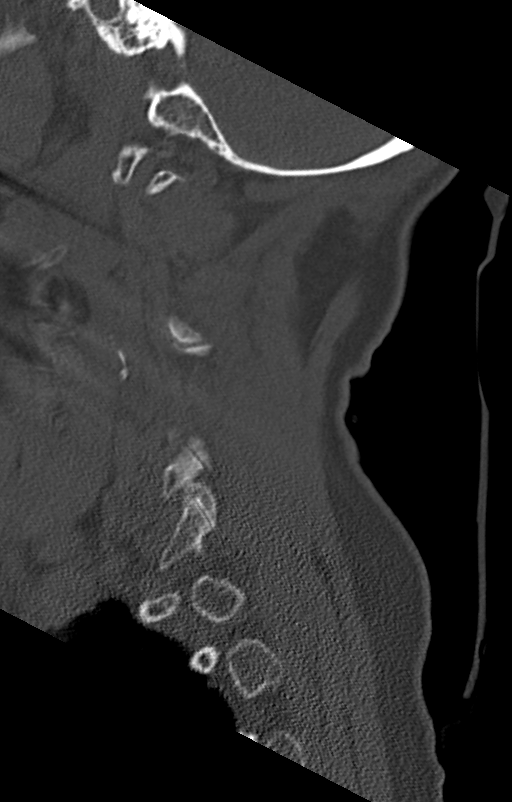
[im 26/63  bone]
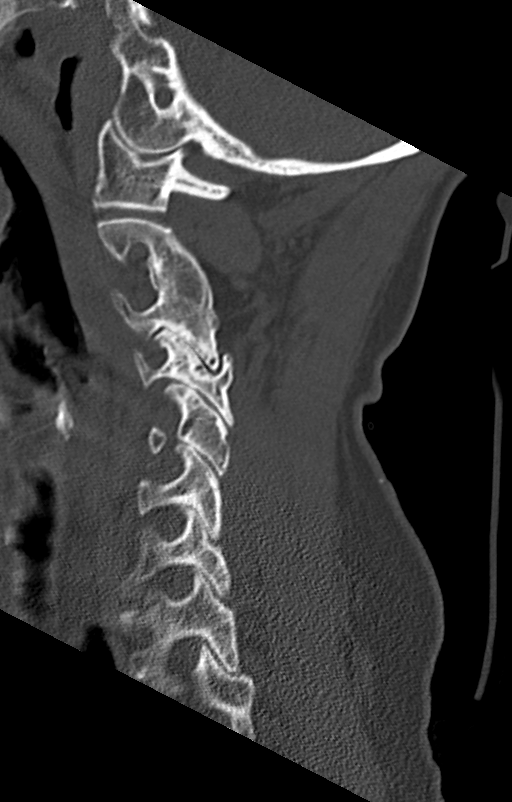
[im 32/63  soft-tissue]
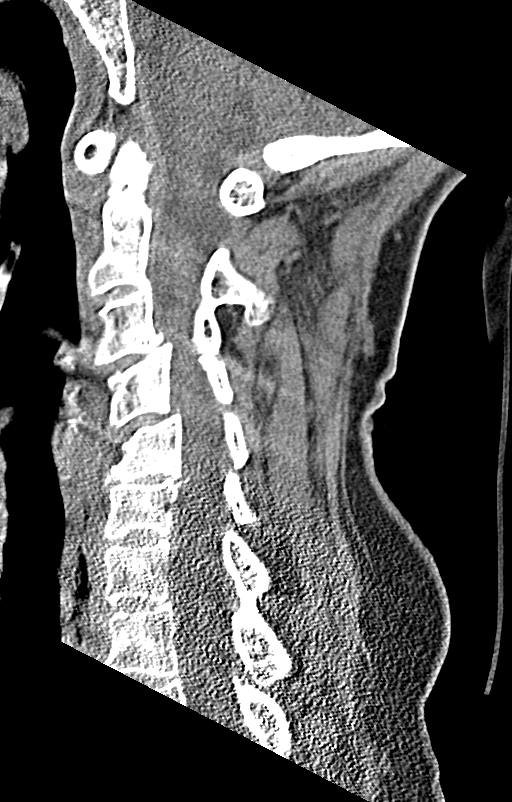
[im 32/63  bone]
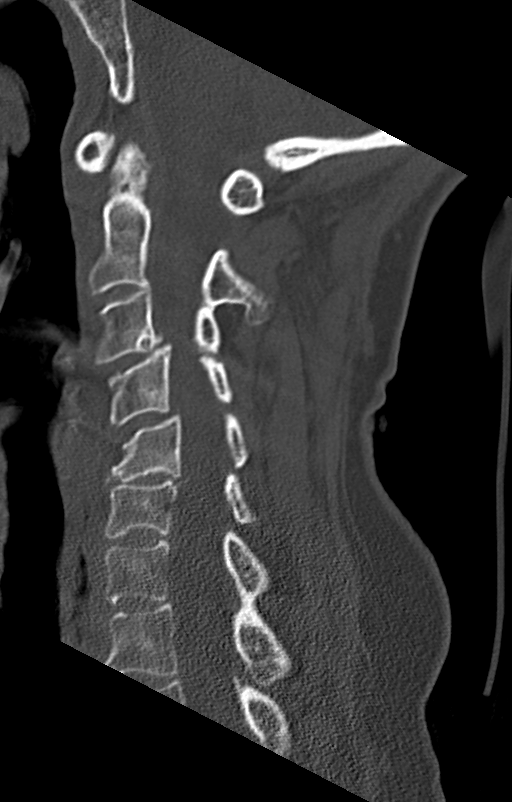
[im 37/63  bone]
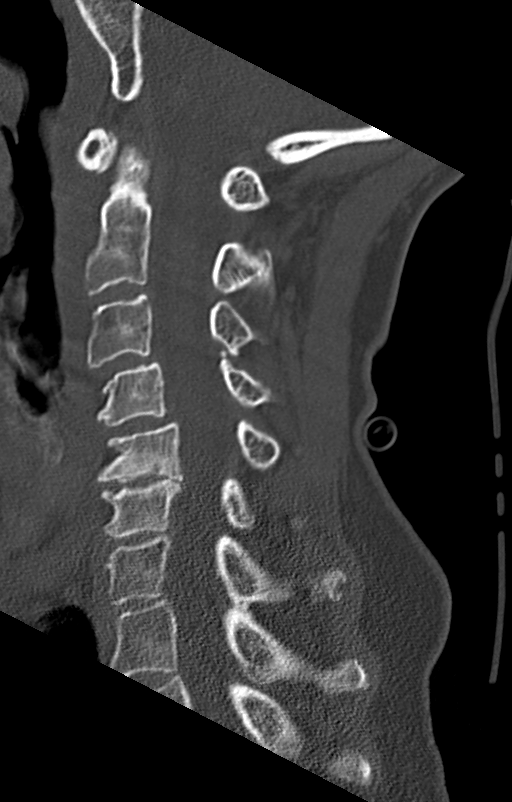
[im 42/63  bone]
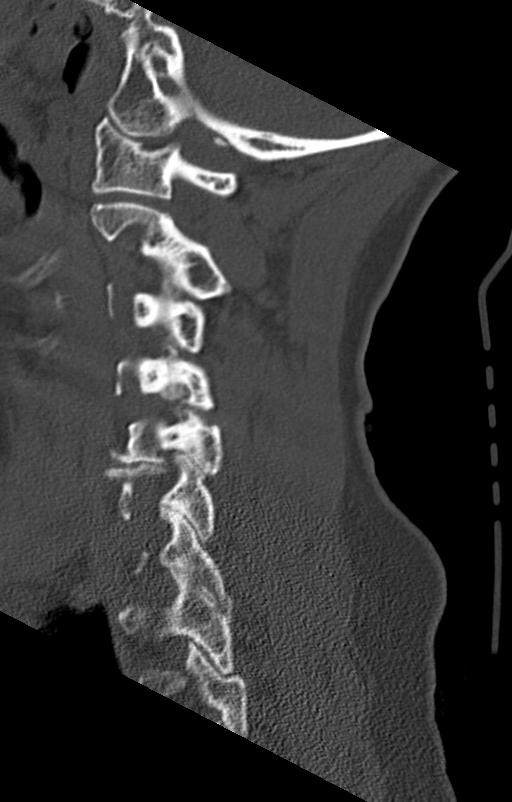

[Series 7: coronal bone · coronal · 0.24mm/px · 3 of 48 slices shown]
[im 10/48  bone]
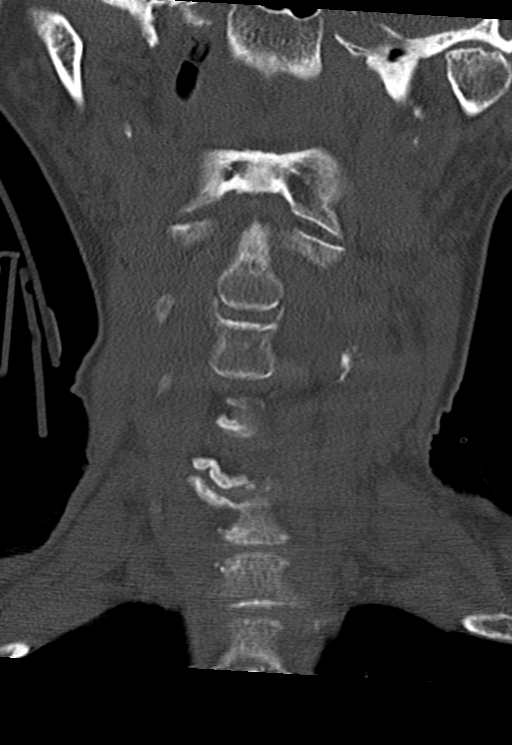
[im 19/48  bone]
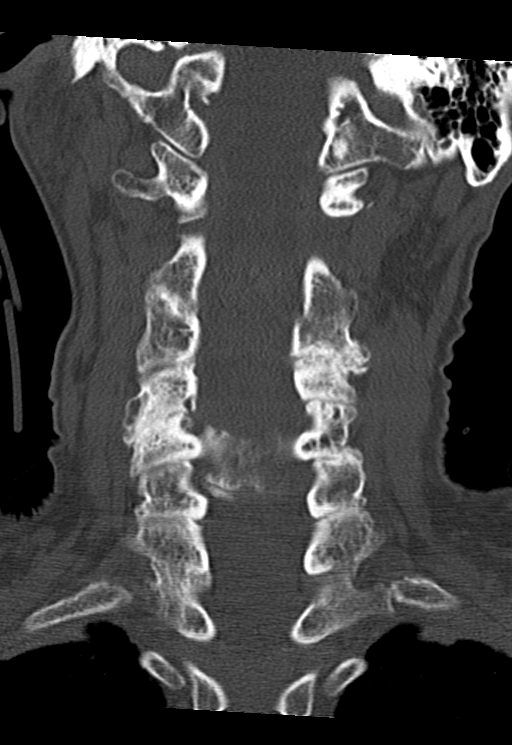
[im 29/48  bone]
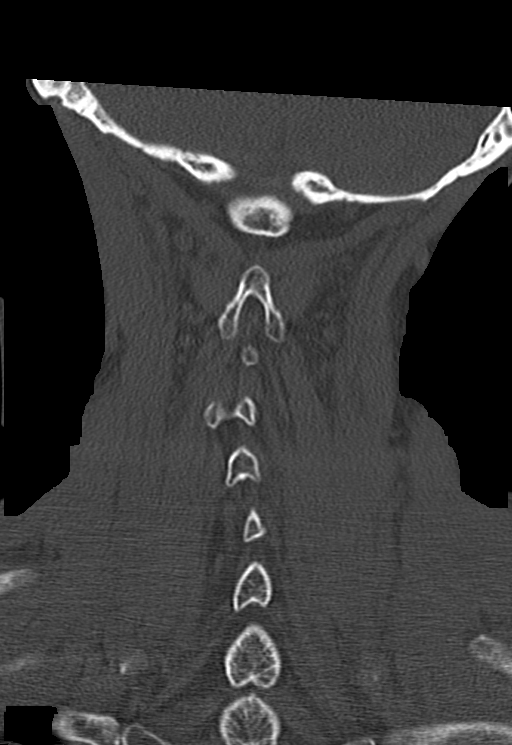

[Series 8: orthogonal bone · axial · 0.23mm/px · z∈[+960,+1068]mm · 4 of 90 slices shown, 5 images]
[im 13/90  soft-tissue]
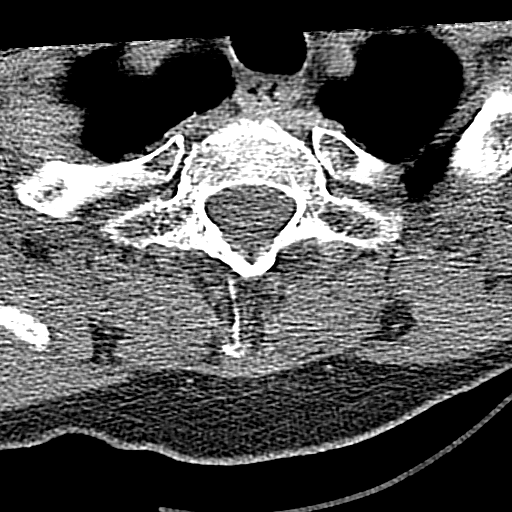
[im 13/90  bone]
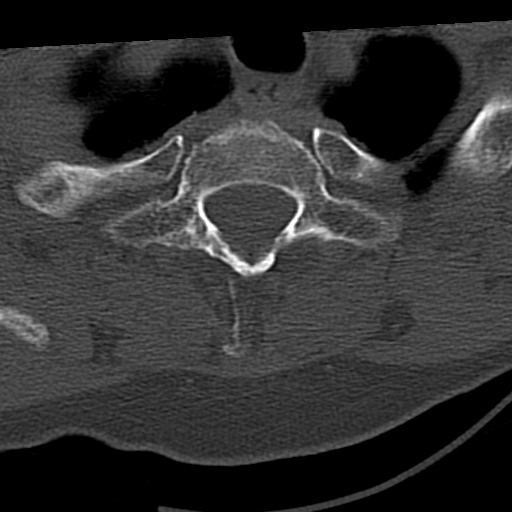
[im 39/90  bone]
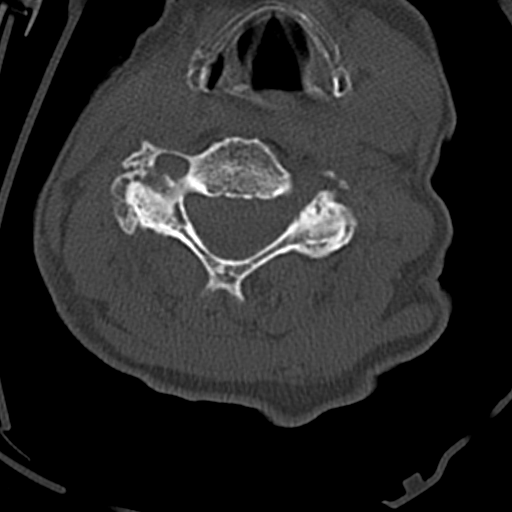
[im 51/90  bone]
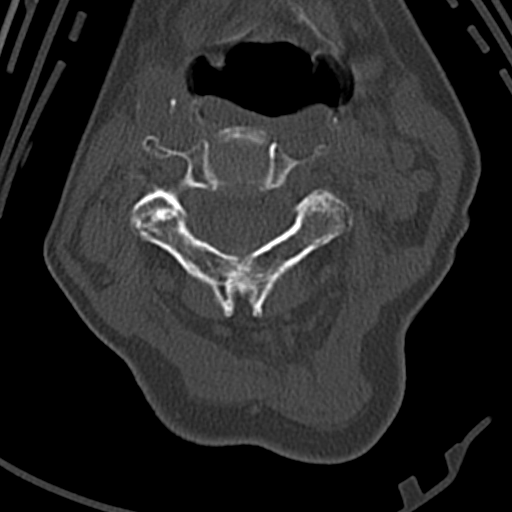
[im 77/90  bone]
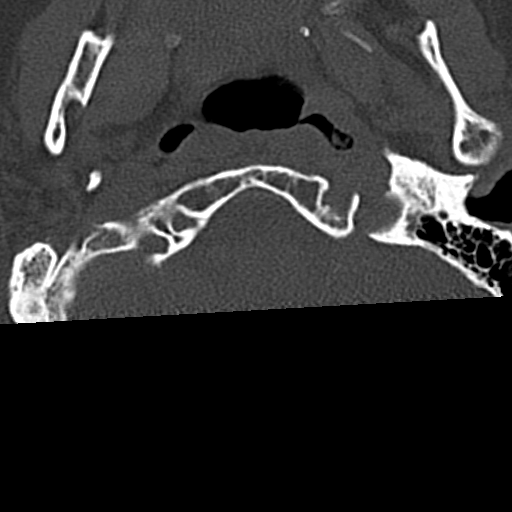

[12 of 33 positions shown; findings below may reference images not displayed]

FINDINGS: Alignment: Minimal cervical curvature convex to the right. 3 mm
degenerative anterolisthesis C3-4 and C4-5.

Skull base and vertebrae: Chronic facet fusion at C2-3. I do not
think there is or was a fracture of the lamina on the left at C3.

Soft tissues and spinal canal: Negative

Disc levels: Foramen magnum is widely patent. Mild osteoarthritis at
the C1-2 articulation without encroachment upon the neural
structures.

C2-3: Chronic facet fusion as noted above. Wide patency of the canal
and foramina.

C3-4: Facet osteoarthritis much worse on the left than the right,
allowing 3 mm of degenerative anterolisthesis. Foraminal narrowing
on the left that could affect the C4 nerve.

C4-5: Facet osteoarthritis worse on the right than the left,
allowing 3 mm of anterolisthesis. Foraminal stenosis on the right
that could affect the right C5 nerve.

C5-6: Degenerative spondylosis more pronounced on the right with
uncovertebral osteophytes. Foraminal narrowing on the right that
could affect the C6 nerve.

C6-7: Facet osteoarthritis on the right. No compressive canal or
foraminal stenosis.

C7-T1: Chronic facet fusion on the right. Wide patency of the canal
and foramina.

Upper chest: Negative

Other: None
IMPRESSION: No appreciable change since the previous study. I do not think there
is or was a fracture of the lamina on the left at C3. There is
chronic fusion of the facets at C2-3 and on the right at C7-T1.

Facet arthropathy at C3-4 and C4-5 with 3 mm of degenerative
anterolisthesis at each of those levels. Foraminal stenosis that
could cause neural compression on the left at C3-4 and on the right
at C4-5.

C5-6 degenerative spondylosis with uncovertebral encroachment upon
the foramen on the right that could cause right-sided C6 nerve
compression.

## 2021-10-31 ENCOUNTER — Telehealth: Payer: Self-pay

## 2021-10-31 NOTE — Telephone Encounter (Signed)
pt called states that she went to her PCP yesterday and that he wanted her to ask you if it would be ok if she was added on wellbutrin for her mood also. That it may help give her more energy.

## 2021-10-31 NOTE — Telephone Encounter (Signed)
Please let patient know I do not recommend wellbutrin since she is currently already struggling with weight loss and wellbutrin causes more reduction in appetite and promote more weight loss. Also she is already on multiple psychotropics and this is adding more medications - polypharmacy. Please let her know when she comes in to office for next visit we could discuss more medication changes and may be reduce some medications during the day and add more changes as needed at that time. If she cannot wait , then please put her on a list to be seen sooner and it has to be IN PERSON .

## 2021-10-31 NOTE — Telephone Encounter (Signed)
Called patient left a message with message from dr. Shea Evans and advised to call office back if she had any additional questions.

## 2021-11-03 NOTE — Progress Notes (Unsigned)
    GYNECOLOGY PROGRESS NOTE  Subjective:    Patient ID: Stephanie [name], female    DOB: 1945-12-31, 76 y.o.   MRN: [id number]  HPI  Patient is a 76 y.o. G83P2002 female who presents for evaluation for postmenopausal bleeding.  {Common ambulatory SmartLinks:19316}  Review of Systems {ros; complete:30496}   Objective:   There were no vitals taken for this visit. There is no height or weight on file to calculate BMI. General appearance: {general exam:16600} Abdomen: {abdominal exam:16834} Pelvic: {pelvic exam:16852::"cervix normal in appearance","external genitalia normal","no adnexal masses or tenderness","no cervical motion tenderness","rectovaginal septum normal","uterus normal size, shape, and consistency","vagina normal without discharge"} Extremities: {extremity exam:5109} Neurologic: {neuro exam:17854}   Assessment:   No diagnosis found.   Plan:   There are no diagnoses linked to this encounter.    Rubie Maid, MD [facility]

## 2021-11-04 ENCOUNTER — Encounter: Payer: Self-pay | Admitting: Obstetrics and Gynecology

## 2021-11-04 ENCOUNTER — Ambulatory Visit: Payer: Medicare PPO | Admitting: Obstetrics and Gynecology

## 2021-11-04 VITALS — BP 133/86 | HR 80 | Resp 16 | Ht 60.0 in | Wt 99.6 lb

## 2021-11-04 DIAGNOSIS — N950 Postmenopausal bleeding: Secondary | ICD-10-CM | POA: Diagnosis not present

## 2021-11-04 DIAGNOSIS — N958 Other specified menopausal and perimenopausal disorders: Secondary | ICD-10-CM | POA: Diagnosis not present

## 2021-11-06 ENCOUNTER — Telehealth: Payer: Self-pay

## 2021-11-06 NOTE — Telephone Encounter (Signed)
pt called states she needs a refill on the temazepam  temazepam (RESTORIL) 7.5 MG capsule Medication Date: 09/01/2021 Department: [facility] Ordering/Authorizing: Ursula Alert, MD   Order Providers  Prescribing Provider Encounter Provider  Ursula Alert, MD Ursula Alert, MD   Outpatient Medication Detail   Disp Refills Start End   temazepam (RESTORIL) 7.5 MG capsule 30 capsule 1 09/01/2021    Sig: TAKE 1 CAPSULE BY MOUTH EVERY NIGHT AT BEDTIME   Sent to pharmacy as: temazepam (RESTORIL) 7.5 MG capsule   E-Prescribing Status: Receipt confirmed by pharmacy (09/01/2021  9:44 AM EDT)

## 2021-11-06 NOTE — Telephone Encounter (Signed)
09/01/2021 09/01/2021 1  Temazepam 7.5 Mg Capsule 30.00 , 0/1  Per review of [place] PMP aware patient has 1 refill left on temazepam which was sent out on 09/01/2021.

## 2021-11-07 NOTE — Telephone Encounter (Signed)
left message for patient to call pharmacy that they should have a rx on hold

## 2021-11-17 ENCOUNTER — Other Ambulatory Visit: Payer: Medicare PPO

## 2021-11-17 ENCOUNTER — Ambulatory Visit (INDEPENDENT_AMBULATORY_CARE_PROVIDER_SITE_OTHER): Payer: Medicare PPO

## 2021-11-17 DIAGNOSIS — N950 Postmenopausal bleeding: Secondary | ICD-10-CM

## 2021-11-25 ENCOUNTER — Other Ambulatory Visit: Payer: Medicare PPO

## 2021-11-26 ENCOUNTER — Ambulatory Visit (INDEPENDENT_AMBULATORY_CARE_PROVIDER_SITE_OTHER): Payer: Medicare PPO | Admitting: Psychiatry

## 2021-11-26 ENCOUNTER — Encounter: Payer: Self-pay | Admitting: Psychiatry

## 2021-11-26 VITALS — BP 137/78 | HR 72 | Temp 98.3°F | Wt 100.4 lb

## 2021-11-26 DIAGNOSIS — F411 Generalized anxiety disorder: Secondary | ICD-10-CM

## 2021-11-26 DIAGNOSIS — Z79899 Other long term (current) drug therapy: Secondary | ICD-10-CM

## 2021-11-26 DIAGNOSIS — F3174 Bipolar disorder, in full remission, most recent episode manic: Secondary | ICD-10-CM | POA: Diagnosis not present

## 2021-11-26 DIAGNOSIS — F5101 Primary insomnia: Secondary | ICD-10-CM

## 2021-11-26 DIAGNOSIS — R413 Other amnesia: Secondary | ICD-10-CM

## 2021-11-26 MED ORDER — QUETIAPINE FUMARATE ER 50 MG PO TB24: 50.0000 mg | ORAL_TABLET | Freq: Every day | ORAL | 1 refills | Status: DC

## 2021-11-26 MED ORDER — DIVALPROEX SODIUM ER 500 MG PO TB24: ORAL_TABLET | ORAL | 1 refills | Status: DC

## 2021-11-26 MED ORDER — QUETIAPINE FUMARATE ER 300 MG PO TB24: 300.0000 mg | ORAL_TABLET | Freq: Every day | ORAL | 1 refills | Status: DC

## 2021-11-26 MED ORDER — GABAPENTIN 300 MG PO CAPS: ORAL_CAPSULE | ORAL | 1 refills | Status: DC

## 2021-11-26 MED ORDER — TEMAZEPAM 7.5 MG PO CAPS: 7.5000 mg | ORAL_CAPSULE | Freq: Every day | ORAL | 5 refills | Status: DC

## 2021-11-26 NOTE — Progress Notes (Unsigned)
[facility] MD OP Progress Note  11/26/2021 12:52 PM Stephanie [name]  MRN:  [id number]  Chief Complaint:  Chief Complaint  Patient presents with   Follow-up: 76 year old Caucasian female with history of bipolar disorder, anxiety disorder, insomnia and multiple medical problems presented for medication management after recent falls.   HPI: Stephanie [name] is a 76 year old Caucasian female, divorced, lives in [place], has a history of multiple medical problems like bipolar disorder, GAD, primary insomnia, TD, Raynaud's phenomena, Sjogren syndrome, hypothyroidism, coronary artery disease, IBS, hyperlipidemia, fibromyalgia, GERD was evaluated in office today.  Patient presented along with her home instead aid.  Patient appeared to be alert, oriented to person place time situation.  Patient reports however she had recent falls.  Reports she had a fall in July and had another fall recently.  Patient was evaluated by primary care provider Dr. Babbaof-11/14/2021-reviewed notes.  Patient was advised to use Roller Walker.  Patient today reports she currently does not have any significant pain although she hit her head when she fell that day.  She does continues to struggle with her balance and although it is frustrating has been using her walker.  She also has been doing tai chi at home to help with her balance.  She reports she is relieved by the fact that she currently has home instead aid who comes in several times a week.  Patient currently denies any depression symptoms.  Denies any significant anxiety.  Reports sleep is overall good.  She uses the temazepam as needed only.  Denies any suicidality, homicidality or perceptual disturbances.  Patient does struggle with her memory. A SLUMS was completed in session today.  Patient scored 21 out of 30.  Likely mild cognitive disorder however will refer for neurology, patient agreeable.  Visit Diagnosis:    ICD-10-CM   1. Bipolar disorder, in full remission, most  recent episode manic ([place])  F31.74     2. GAD (generalized anxiety disorder)  F41.1 QUEtiapine (SEROQUEL XR) 300 MG 24 hr tablet    3. Primary insomnia  F51.01 temazepam (RESTORIL) 7.5 MG capsule    QUEtiapine (SEROQUEL XR) 300 MG 24 hr tablet    4. High risk medication use  Z79.899 QUEtiapine (SEROQUEL XR) 50 MG TB24 24 hr tablet    5. Memory loss  R41.3 gabapentin (NEURONTIN) 300 MG capsule    divalproex (DEPAKOTE ER) 500 MG 24 hr tablet      Past Psychiatric History: Reviewed past psychiatric history from progress note on 06/17/2017.  Inpatient [facility] health admission at [facility]-01/02/2020 - 01/11/2020.  Past Medical History:  Past Medical History:  Diagnosis Date   Anginal pain ([place])    Anxiety    Bipolar 1 disorder ([place])    Bipolar disorder ([place])    Coronary artery disease    Depression    Fatigue    Fibromyalgia    GERD (gastroesophageal reflux disease)    Heart disease    Heart failure (HCC)    HLD (hyperlipidemia)    HOH (hard of hearing)    Hypothyroidism    IBS (irritable bowel syndrome)    Pelvic pain in female    Perianal lesion    high grade sil lesion- keratinizing type   RA (rheumatoid arthritis) ([place])    Raynaud phenomenon    Sjogren's disease ([place])    Sleep apnea    Vaginal Pap smear, abnormal     Past Surgical History:  Procedure Laterality Date   CATARACT EXTRACTION W/PHACO  Left 02/23/2017   Procedure: CATARACT EXTRACTION PHACO AND INTRAOCULAR LENS PLACEMENT (IOC);  Surgeon: Birder Robson, MD;  Location: [facility] ORS;  Service: Ophthalmology;  Laterality: Left;  [place] 00:35 AP% 9.6 CDE 3.47 Fluid pack lot # [id number]   CATARACT EXTRACTION W/PHACO Right 03/30/2017   Procedure: CATARACT EXTRACTION PHACO AND INTRAOCULAR LENS PLACEMENT (IOC);  Surgeon: Birder Robson, MD;  Location: [facility] ORS;  Service: Ophthalmology;  Laterality: Right;  [place] 00:32.6 AP% 13.8 CDE 4.51 Fluid Pack Lot # [id number]   EYE SURGERY     TONSILLECTOMY       Family Psychiatric History: Reviewed family psychiatric history from progress note on 06/17/2017.  Family History:  Family History  Problem Relation Age of Onset   Heart failure Father    Parkinson's disease Mother    Bipolar disorder Mother    Depression Sister    Schizophrenia Paternal Grandmother    Diabetes Maternal Grandmother    Pancreatic cancer Maternal Grandmother    Cancer Neg Hx    Heart disease Neg Hx    Breast cancer Neg Hx     Social History: Reviewed social history from progress note on 06/17/2017. Social History   Socioeconomic History   Marital status: Divorced    Spouse name: Not on file   Number of children: 2   Years of education: Not on file   Highest education level: Master's degree (e.g., MA, MS, MEng, MEd, MSW, MBA)  Occupational History    Comment: retired2  Tobacco Use   Smoking status: Never   Smokeless tobacco: Former  Scientific laboratory technician Use: Never used  Substance and Sexual Activity   Alcohol use: No    Alcohol/week: 0.0 standard drinks of alcohol   Drug use: No   Sexual activity: Never    Birth control/protection: Post-menopausal  Other Topics Concern   Not on file  Social History Narrative   Not on file   Social Determinants of Health   Financial Resource Strain: Medium Risk (01/26/2017)   Overall Financial Resource Strain (CARDIA)    Difficulty of Paying Living Expenses: Somewhat hard  Food Insecurity: No Food Insecurity (01/26/2017)   Hunger Vital Sign    Worried About Running Out of Food in the Last Year: Never true    [place] in the Last Year: Never true  Transportation Needs: No Transportation Needs (01/26/2017)   PRAPARE - Hydrologist (Medical): No    Lack of Transportation (Non-Medical): No  Physical Activity: Inactive (01/26/2017)   Exercise Vital Sign    Days of Exercise per Week: 0 days    Minutes of Exercise per Session: 0 min  Stress: No Stress Concern Present (01/26/2017)    [place]    Feeling of Stress : Only a little  Social Connections: Unknown (01/26/2017)   Social Connection and Isolation Panel [NHANES]    Frequency of Communication with Friends and Family: Not on file    Frequency of Social Gatherings with Friends and Family: Not on file    Attends Religious Services: Never    Marine scientist or Organizations: No    Attends Archivist Meetings: Never    Marital Status: Divorced    Allergies:  Allergies  Allergen Reactions   Amoxicillin-Pot Clavulanate Nausea Only and Other (See Comments)    Diarrhea Has patient had a PCN reaction causing immediate rash, facial/tongue/throat swelling, SOB or lightheadedness with hypotension:  Has  patient had a PCN reaction causing severe rash involving mucus membranes or skin necrosis: Has patient had a PCN reaction that required hospitalization:  Has patient had a PCN reaction occurring within the last 10 years: If all of the above answers are "NO", then may proceed with Cephalosporin use.    Oxycodone-Acetaminophen Nausea Only   Erythromycin Nausea Only   Azithromycin Nausea And Vomiting   Codeine    Macrolides And Ketolides    Oxycodone-Acetaminophen    Penicillins Other (See Comments)    Has patient had a PCN reaction causing immediate rash, facial/tongue/throat swelling, SOB or lightheadedness with hypotension: NO Has patient had a PCN reaction causing severe rash involving mucus membranes or skin necrosis: NO Has patient had a PCN reaction that required hospitalization: NO Has patient had a PCN reaction occurring within the last 10 years: NO If all of the above answers are "NO", then may proceed with Cephalosporin use.    Remeron [Mirtazapine]     Side effect-likely tardive dyskinesia   Tramadol Nausea And Vomiting   Zolpidem     Sleep walking   Lamotrigine Rash    LAMICTAL   Sulfa Antibiotics Rash     Metabolic Disorder Labs: Lab Results  Component Value Date   HGBA1C 5.3 11/22/2017   Lab Results  Component Value Date   PROLACTIN 33.0 (H) 11/22/2017   Lab Results  Component Value Date   CHOL 222 (H) 01/02/2020   TRIG 190 (H) 01/02/2020   HDL 69 01/02/2020   CHOLHDL 3.2 01/02/2020   VLDL 38 01/02/2020   LDLCALC 115 (H) 01/02/2020   LDLCALC 119 (H) 11/22/2017   Lab Results  Component Value Date   TSH 2.678 01/02/2020    Therapeutic Level Labs: No results found for: "LITHIUM" Lab Results  Component Value Date   VALPROATE 90 10/16/2021   VALPROATE 60 01/22/2021   Lab Results  Component Value Date   CBMZ 7.4 10/16/2021   CBMZ 7.3 01/22/2021    Current Medications: Current Outpatient Medications  Medication Sig Dispense Refill   Acetaminophen 500 MG capsule Take 1,000 mg by mouth. tid     aspirin 81 MG chewable tablet Chew 81 mg by mouth daily with breakfast.     Calcium Citrate-Vitamin D 200-250 MG-UNIT TABS Take 1 tablet by mouth daily after breakfast.      carbamazepine (TEGRETOL XR) 200 MG 12 hr tablet TAKE 2 TABLETS BY MOUTH TWICE A DAY 360 tablet 1   ezetimibe (ZETIA) 10 MG tablet Take 1 tablet by mouth daily.     fluticasone (FLONASE) 50 MCG/ACT nasal spray      folic acid (FOLVITE) 1 MG tablet Take 2 mg by mouth daily with breakfast.      hypromellose (GENTEAL) 0.3 % GEL ophthalmic ointment Apply to eye.     ibandronate (BONIVA) 150 MG tablet every 30 (thirty) days.     levothyroxine (SYNTHROID, LEVOTHROID) 75 MCG tablet Take 75 mcg by mouth daily before breakfast.      methotrexate 2.5 MG tablet Take 15 mg by mouth every Wednesday. WEDNESDAYS AT LUNCH     mometasone (ELOCON) 0.1 % cream Apply to rash on the left index finger once to twice daily until healed. Avoid applying to face, groin, and axilla. Use as directed. Long-term use can cause thinning of the skin. 15 g 0   Multiple Vitamins-Minerals (MULTIVITAMIN WITH MINERALS) tablet Take 1 tablet by  mouth daily with breakfast.      MYRBETRIQ 50 MG TB24 tablet  TAKE 1 TABLET BY MOUTH ONCE A DAY 30 tablet 11   nitroGLYCERIN (NITROSTAT) 0.4 MG SL tablet Place under the tongue.     pravastatin (PRAVACHOL) 80 MG tablet Take 1 tablet by mouth at bedtime.     Propylene Glycol 0.6 % SOLN Apply to eye. Takes 4 times a day, systane     divalproex (DEPAKOTE ER) 500 MG 24 hr tablet TAKE 1 TABLET BY MOUTH TWICE A DAY 180 tablet 1   gabapentin (NEURONTIN) 300 MG capsule TAKE 1 CAPSULE BY MOUTH TWICE DAILY 180 capsule 1   QUEtiapine (SEROQUEL XR) 300 MG 24 hr tablet Take 1 tablet (300 mg total) by mouth at bedtime. 90 tablet 1   QUEtiapine (SEROQUEL XR) 50 MG TB24 24 hr tablet Take 1 tablet (50 mg total) by mouth at bedtime. Take 50 mg at bedtime along with 300 mg - total of 350 mg 90 tablet 1   temazepam (RESTORIL) 7.5 MG capsule Take 1 capsule (7.5 mg total) by mouth at bedtime. 30 capsule 5   No current facility-administered medications for this visit.     Musculoskeletal: Strength & Muscle Tone: within normal limits Gait & Station:  walks with walker Patient leans: Front  Psychiatric Specialty Exam: Review of Systems  Psychiatric/Behavioral: Negative.    All other systems reviewed and are negative.   Blood pressure 137/78, pulse 72, temperature 98.3 F (36.8 C), temperature source Temporal, weight 100 lb 6.4 oz (45.5 kg).Body mass index is 19.61 kg/m.  General Appearance: Fairly Groomed  Eye Contact:  Fair  Speech:  Clear and Coherent  Volume:  Normal  Mood:  Euthymic  Affect:  Congruent  Thought Process:  Goal Directed and Descriptions of Associations: Intact  Orientation:  Full (Time, Place, and Person)  Thought Content: Logical   Suicidal Thoughts:  No  Homicidal Thoughts:  No  Memory:  Immediate;   Fair Recent;   Fair Remote;   limited  Judgement:  Fair  Insight:  Fair  Psychomotor Activity:  Normal  Concentration:  Concentration: Poor and Attention Span: Fair  Recall:   Poor  Fund of Knowledge: Fair  Language: Fair  Akathisia:  No  Handed:  Right  AIMS (if indicated): done  Assets:  Communication Skills Desire for Improvement Housing Social Support Transportation  ADL's:  Intact  Cognition: fair  Sleep:  Fair   Screenings: [place] Office Visit from 08/26/2021 in [facility] [place] Office Visit from 07/01/2021 in [facility] Visit from 03/11/2021 in [place] Visit from 07/17/2020 in [facility] Visit from 06/14/2020 in [place] Total Score 0 0 0 0 0      AUDIT    Flowsheet Row Admission (Discharged) from 01/02/2020 in [facility]  Alcohol Use Disorder Identification Test Final Score (AUDIT) 0      GAD-7    Flowsheet Row Office Visit from 11/26/2021 in [facility] Office Visit from 07/17/2020 in [facility]  Total GAD-7 Score 9 5      PHQ2-9    [place] Visit from 11/26/2021 in [facility] Visit from 08/26/2021 in [place] Visit from 07/01/2021 in [place] Visit from 04/02/2021 in [facility] Visit from 03/11/2021 in [place]  PHQ-2 Total Score 2 0 0 2 2  PHQ-9 Total  Score '7 5 5 5 6      ' Flowsheet Row Office Visit from 11/26/2021 in [facility] Office Visit from 08/26/2021 in [place] Office Visit from 07/01/2021 in [place] No Risk No Risk No Risk        Assessment and Plan: Stephanie [name] is a 62 year old Caucasian female, divorced, lives in [place], has a history of bipolar disorder,  multiple medical problems was evaluated in office today.  Patient with recent falls, memory problems, will benefit from the following plan.  Plan Bipolar disorder in remission Seroquel extended release 350 mg p.o. nightly Tegretol extended release 400 mg p.o. twice daily Depakote extended release 500 mg p.o. twice daily Gabapentin 300 mg p.o. twice daily  Insomnia-stable Seroquel extended release as prescribed Temazepam 7.5 mg p.o. nightly as needed Reviewed [place] PMP aware  GAD-stable Continue gabapentin 300 mg p.o. twice daily Continue Seroquel as prescribed Continue CBT with [name]  High risk medication use-reviewed and discussed labs-10/23/2021-CBC with differential-platelet count-130-low however patient has chronic low platelet count, WBC low at 4, MCV high at 106, patient to continue to follow up with primary care provider. CMP-carbon dioxide-32.8 otherwise within normal limits. TSH-within normal limits 10/16/2021-valproic acid level-90-therapeutic 10/16/2021-carbamazepine level-7.4-therapeutic   Patient with memory loss/recent falls-will refer to neurology. SLUMS -21 out of 30.  Down trending compared to her previous score  Collateral information obtained from aid who came in with patient--patient doing well with regards to her mood.  With elevated blood pressure reading-blood pressure was repeated in session.  Patient to follow up with primary care provider for management as needed.  Follow-up in clinic in 2 months or sooner if needed.  This note was generated in part or whole with voice recognition software. Voice recognition is usually quite accurate but there are transcription errors that can and very often do occur. I apologize for any typographical errors that were not detected and corrected.     Ursula Alert, MD 11/26/2021, 12:52 PM

## 2021-11-27 ENCOUNTER — Telehealth: Payer: Self-pay

## 2021-11-27 NOTE — Telephone Encounter (Signed)
left message that Dr. Shea Evans did not add any BP medication that she was told to call her primary care provider about her BP. and i also asked her to call back about the medication changes she was talking about.

## 2021-11-27 NOTE — Telephone Encounter (Signed)
pt called left message that she need someone to call her that she is not sure of a medication change and she doesn't want to take BP medication.

## 2021-12-03 ENCOUNTER — Telehealth: Payer: Medicare PPO | Admitting: Obstetrics and Gynecology

## 2021-12-03 ENCOUNTER — Encounter: Payer: Self-pay | Admitting: Obstetrics and Gynecology

## 2021-12-03 ENCOUNTER — Telehealth: Payer: Self-pay | Admitting: Urology

## 2021-12-03 VITALS — Ht 60.0 in | Wt 99.0 lb

## 2021-12-03 DIAGNOSIS — N950 Postmenopausal bleeding: Secondary | ICD-10-CM | POA: Diagnosis not present

## 2021-12-03 DIAGNOSIS — N840 Polyp of corpus uteri: Secondary | ICD-10-CM

## 2021-12-03 NOTE — Telephone Encounter (Signed)
Patient called and stated that she had an ultrasound done by her gynecologist, Dr. Rubie Maid, and that some polyps were found on her uterus. She also thinks that is why she is having the bloody discharge and she had questions about a medication prescribed by [facility]) and wanted a call back in regards to this.

## 2021-12-03 NOTE — Progress Notes (Signed)
Patient presents for televisit to discuss ultrasound results.   Stephanie [name], Crysal L, CMA

## 2021-12-03 NOTE — Telephone Encounter (Signed)
Returned pt's call. She just wanted [place] to know the information below and that she will be having surgery in October to remove polyps. When asked ab what questions she may have, she denies any questions.

## 2021-12-03 NOTE — Progress Notes (Signed)
Virtual Visit via Telephone Note  I connected with Stephanie [name] on 12/03/21 at 11:30 AM EDT by telephone and verified that I am speaking with the correct person using two identifiers.  Location: Patient: Home Provider: Office   I discussed the limitations, risks, security and privacy concerns of performing an evaluation and management service by telephone and the availability of in person appointments. I also discussed with the patient that there may be a patient responsible charge related to this service. The patient expressed understanding and agreed to proceed.   History of Present Illness: Stephanie [name] is a 76 y.o. G24P2002 female who presents for discussion of ultrasound results. Ultrasound was performed due to post-menopausal bleeding. Patient has no complaints today.    Observations/Objective: Height 5' (1.524 m), weight 99 lb (44.9 kg). Gen App: NAD.  Remainder of exam deferred.     Imaging:  US PELVIC COMPLETE WITH TRANSVAGINAL Patient Name: Stephanie [name] DOB: [birth year] MRN: [id number] LMP: postmenopausal   ULTRASOUND REPORT  Location: [facility] Date of Service: 11/17/2021   Indications: postmenopausal bleeding Findings:  The uterus is retroverted and measures 4.7 x 3.3 x 4.6cm Echo texture is homogenous without evidence of focal masses.  The Endometrium measures 2.75m  There is fluid within the endometrium, AP diameter is 4.870mAlong the anterior wall of the endometrium is a possible polyp, 3.4 x  1.65m45mRight Ovary appears normal and measures, 1.5 x 1.1 x 0.9cm Left Ovary is not visualized Survey of the adnexa demonstrates no adnexal masses. There is no free fluid in the cul de sac.  Impression: 1. Fluid within the endometrium 2. Possible endometrial polyp  Recommendations: 1.Clinical correlation with the patient's History and Physical Exam.  KriEdwena BundeDMS, RVT   The ultrasound images and findings were reviewed by me and  I agree with  the above report.  DavFinis Bud.D. 11/24/2021 4:47 PM    Assessment: Endometrial polyp Postmenopausal bleeding  Plan: Discussion had regarding endometrial biopsy findings. Patient with benign endometrial polyp.  In the setting of postmenopausal bleeding, it is recommended for polyp removal.  This can be performed by Hysteroscopy D&C with polypectomy. Discussed nature of procedure, including risks and benefits. Advised that the likelihood of resolution of her symptoms was high. All questions answered. Patient notes understanding.  She is ok with recommended procedure and will plan to schedule surgery for 12/22/2021.   Given pre-operative instructions. Also will mail AVS as patient notes she is not computer savy.    Follow Up Instructions: Will notify patient of pre-op instructions.    I discussed the assessment and treatment plan with the patient. The patient was provided an opportunity to ask questions and all were answered. The patient agreed with the plan and demonstrated an understanding of the instructions.   The patient was advised to call back or seek an in-person evaluation if the symptoms worsen or if the condition fails to improve as anticipated.  I provided 15 minutes of non-face-to-face time during this encounter.   AniRubie MaidD

## 2021-12-03 NOTE — Patient Instructions (Signed)
GYNECOLOGY PRE-OPERATIVE INSTRUCTIONS  You are scheduled for surgery on 12/22/2021.  The name of your procedure is: Hysteroscopy Dilation and Curettage with Polypectomy (removal of polyp).   Please read through these instructions carefully regarding preparation for your surgery: Nothing to eat after midnight on the day prior to surgery.  Do not take any medications unless recommended by your provider on day prior to surgery.  Do not take NSAIDs (Motrin, Aleve) or aspirin 7 days prior to surgery.  You may take Tylenol products for minor aches and pains.  You will receive a prescription for pain medications post-operatively.  You will be contacted by phone approximately 1-2 weeks prior to surgery to schedule your pre-operative appointment.  You will not require admission to the hospital for this procedure.  Please call the office if you have any questions regarding your upcoming surgery.    Thank you for choosing [facility].

## 2021-12-10 ENCOUNTER — Telehealth: Payer: Self-pay

## 2021-12-10 NOTE — Telephone Encounter (Signed)
she is not going to take any medication for her blood pressure that it is due to white coat syndrome. she states her son is trying to get her into [facility] but for now she is getting home [facility] to come in. she also states that oct 16th she is getting some polyps removed and then she sees neurology in November .

## 2021-12-10 NOTE — Telephone Encounter (Signed)
Yes, she will be in recovery for 2-3 days.

## 2021-12-10 NOTE — Telephone Encounter (Signed)
Pt needs to know how long will she be in recovery 2-3 days? Also to let Dr Marcelline Mates know that [facility] will arrange someone for the first day to be with her after surgery.

## 2021-12-10 NOTE — Telephone Encounter (Signed)
She would not require physical therapy prior to this procedure as it is a very short one (usually 15-20 minutes).  However after surgery, it may be worthwhile to mention to her PCP regarding her concerns and desire for physical therapy.

## 2021-12-10 NOTE — Telephone Encounter (Signed)
Pt aware.

## 2021-12-10 NOTE — Telephone Encounter (Signed)
Noted  

## 2021-12-10 NOTE — Telephone Encounter (Signed)
Pt states she has been having falls and was wondering if she needs physical therapy before this surgery and if so can you put in a referral for her? Today is her PCP day off, should she wait and ask them?

## 2021-12-13 ENCOUNTER — Other Ambulatory Visit: Payer: Self-pay | Admitting: Obstetrics and Gynecology

## 2021-12-13 DIAGNOSIS — Z01812 Encounter for preprocedural laboratory examination: Secondary | ICD-10-CM

## 2021-12-16 ENCOUNTER — Telehealth: Payer: Self-pay | Admitting: Obstetrics and Gynecology

## 2021-12-16 ENCOUNTER — Encounter
Admission: RE | Admit: 2021-12-16 | Discharge: 2021-12-16 | Disposition: A | Discharge status: Home / Self Care | Payer: Medicare PPO | Source: Ambulatory Visit | Attending: Obstetrics and Gynecology | Admitting: Obstetrics and Gynecology

## 2021-12-16 ENCOUNTER — Other Ambulatory Visit: Payer: Self-pay

## 2021-12-16 DIAGNOSIS — Z01812 Encounter for preprocedural laboratory examination: Secondary | ICD-10-CM

## 2021-12-16 NOTE — Patient Instructions (Addendum)
Your procedure is scheduled on: 12/22/21 - Monday Report to the Registration Desk on the 1st floor of the [facility]. To find out your arrival time, please call [phone] between 1PM - 3PM on: 12/19/21 - Friday If your arrival time is 6:00 am, do not arrive prior to that time as the [facility] entrance doors do not open until 6:00 am.  REMEMBER: Instructions that are not followed completely may result in serious medical risk, up to and including death; or upon the discretion of your surgeon and anesthesiologist your surgery may need to be rescheduled.  Do not eat food or drink any fluids after midnight the night before surgery.  No gum chewing, lozengers or hard candies.  TAKE THESE MEDICATIONS THE MORNING OF SURGERY WITH A SIP OF WATER:  - carbamazepine (TEGRETOL XR) - divalproex (DEPAKOTE ER) - levothyroxine (SYNTHROID)   One week prior to surgery: Stop Anti-inflammatories (NSAIDS) such as Advil, Aleve, Ibuprofen, Motrin, Naproxen, Naprosyn and Aspirin based products such as Excedrin, Goodys Powder, BC Powder.  HOLD Aspirin 81 mg for 7 days prior to your surgery.  Stop ANY OVER THE COUNTER supplements until after surgery.  You may however, continue to take Tylenol if needed for pain up until the day of surgery.  No Alcohol for 24 hours before or after surgery.  No Smoking including e-cigarettes for 24 hours prior to surgery.  No chewable tobacco products for at least 6 hours prior to surgery.  No nicotine patches on the day of surgery.  Do not use any "recreational" drugs for at least a week prior to your surgery.  Please be advised that the combination of cocaine and anesthesia may have negative outcomes, up to and including death. If you test positive for cocaine, your surgery will be cancelled.  On the morning of surgery brush your teeth with toothpaste and water, you may rinse your mouth with mouthwash if you wish. Do not swallow any toothpaste or mouthwash.  Do  not wear jewelry, make-up, hairpins, clips or nail polish.  Do not wear lotions, powders, or perfumes.   Do not shave body from the neck down 48 hours prior to surgery just in case you cut yourself which could leave a site for infection.  Also, freshly shaved skin may become irritated if using the CHG soap.  Contact lenses, hearing aids and dentures may not be worn into surgery.  Do not bring valuables to the hospital. [facility] is not responsible for any missing/lost belongings or valuables.   Notify your doctor if there is any change in your medical condition (cold, fever, infection).  Wear comfortable clothing (specific to your surgery type) to the hospital.  After surgery, you can help prevent lung complications by doing breathing exercises.  Take deep breaths and cough every 1-2 hours. Your doctor may order a device called an Incentive Spirometer to help you take deep breaths. When coughing or sneezing, hold a pillow firmly against your incision with both hands. This is called "splinting." Doing this helps protect your incision. It also decreases belly discomfort.  If you are being admitted to the hospital overnight, leave your suitcase in the car. After surgery it may be brought to your room.  If you are being discharged the day of surgery, you will not be allowed to drive home. You will need a responsible adult (18 years or older) to drive you home and stay with you that night.   If you are taking public transportation, you will need  to have a responsible adult (18 years or older) with you. Please confirm with your physician that it is acceptable to use public transportation.   Please call the [facility]. at [phone] if you have any questions about these instructions.  Surgery Visitation Policy:  Patients undergoing a surgery or procedure may have two family members or support persons with them as long as the person is not COVID-19 positive or  experiencing its symptoms.   Inpatient Visitation:    Visiting hours are 7 a.m. to 8 p.m. Up to four visitors are allowed at one time in a patient room, including children. The visitors may rotate out with other people during the day. One designated support person (adult) may remain overnight.

## 2021-12-16 NOTE — Telephone Encounter (Signed)
Left message for patient to call office back to make a 2 week post after her surgery with Dr Marcelline Mates. Surgery is scheduled for 12/22/2021

## 2021-12-21 MED ORDER — CHLORHEXIDINE GLUCONATE 0.12 % MT SOLN
15.0000 mL | Freq: Once | OROMUCOSAL | Status: AC
  Administered 2021-12-22: 15 mL via OROMUCOSAL

## 2021-12-21 MED ORDER — LACTATED RINGERS IV SOLN: INTRAVENOUS | Status: DC

## 2021-12-21 MED ORDER — ORAL CARE MOUTH RINSE: 15.0000 mL | Freq: Once | OROMUCOSAL | Status: AC

## 2021-12-21 MED ORDER — ACETAMINOPHEN 500 MG PO TABS
1000.0000 mg | ORAL_TABLET | ORAL | Status: AC
  Administered 2021-12-22: 1000 mg via ORAL

## 2021-12-21 MED ORDER — FAMOTIDINE 20 MG PO TABS
20.0000 mg | ORAL_TABLET | Freq: Once | ORAL | Status: AC
  Administered 2021-12-22: 20 mg via ORAL

## 2021-12-21 MED ORDER — CELECOXIB 200 MG PO CAPS
400.0000 mg | ORAL_CAPSULE | ORAL | Status: AC
  Administered 2021-12-22: 400 mg via ORAL

## 2021-12-21 MED ORDER — GABAPENTIN 300 MG PO CAPS
300.0000 mg | ORAL_CAPSULE | ORAL | Status: AC
  Administered 2021-12-22: 300 mg via ORAL

## 2021-12-22 ENCOUNTER — Encounter
Admission: RE | Disposition: A | Discharge status: Home / Self Care | Payer: Self-pay | Source: Home / Self Care | Attending: Obstetrics and Gynecology

## 2021-12-22 ENCOUNTER — Other Ambulatory Visit: Payer: Self-pay

## 2021-12-22 ENCOUNTER — Ambulatory Visit
Admission: RE | Admit: 2021-12-22 | Discharge: 2021-12-22 | Disposition: A | Discharge status: Home / Self Care | Payer: Medicare PPO | Attending: Obstetrics and Gynecology | Admitting: Obstetrics and Gynecology

## 2021-12-22 ENCOUNTER — Encounter: Payer: Self-pay | Admitting: Obstetrics and Gynecology

## 2021-12-22 ENCOUNTER — Ambulatory Visit: Payer: Medicare PPO | Admitting: General Practice

## 2021-12-22 DIAGNOSIS — I10 Essential (primary) hypertension: Secondary | ICD-10-CM | POA: Diagnosis not present

## 2021-12-22 DIAGNOSIS — F419 Anxiety disorder, unspecified: Secondary | ICD-10-CM | POA: Insufficient documentation

## 2021-12-22 DIAGNOSIS — N950 Postmenopausal bleeding: Secondary | ICD-10-CM | POA: Diagnosis present

## 2021-12-22 DIAGNOSIS — G4730 Sleep apnea, unspecified: Secondary | ICD-10-CM | POA: Insufficient documentation

## 2021-12-22 DIAGNOSIS — E039 Hypothyroidism, unspecified: Secondary | ICD-10-CM | POA: Diagnosis not present

## 2021-12-22 DIAGNOSIS — Z79890 Hormone replacement therapy (postmenopausal): Secondary | ICD-10-CM | POA: Insufficient documentation

## 2021-12-22 DIAGNOSIS — I2510 Atherosclerotic heart disease of native coronary artery without angina pectoris: Secondary | ICD-10-CM | POA: Diagnosis not present

## 2021-12-22 DIAGNOSIS — G709 Myoneural disorder, unspecified: Secondary | ICD-10-CM | POA: Insufficient documentation

## 2021-12-22 DIAGNOSIS — K219 Gastro-esophageal reflux disease without esophagitis: Secondary | ICD-10-CM | POA: Diagnosis not present

## 2021-12-22 DIAGNOSIS — F319 Bipolar disorder, unspecified: Secondary | ICD-10-CM | POA: Diagnosis not present

## 2021-12-22 DIAGNOSIS — Z01812 Encounter for preprocedural laboratory examination: Secondary | ICD-10-CM

## 2021-12-22 DIAGNOSIS — Z79899 Other long term (current) drug therapy: Secondary | ICD-10-CM | POA: Diagnosis not present

## 2021-12-22 DIAGNOSIS — Z9889 Other specified postprocedural states: Secondary | ICD-10-CM

## 2021-12-22 HISTORY — PX: HYSTEROSCOPY WITH D & C: SHX1775

## 2021-12-22 LAB — CBC
HCT: 43.1 % (ref 36.0–46.0)
Hemoglobin: 14.1 g/dL (ref 12.0–15.0)
MCH: 34.7 pg — ABNORMAL HIGH (ref 26.0–34.0)
MCHC: 32.7 g/dL (ref 30.0–36.0)
MCV: 106.2 fL — ABNORMAL HIGH (ref 80.0–100.0)
Platelets: 189 10*3/uL (ref 150–400)
RBC: 4.06 MIL/uL (ref 3.87–5.11)
RDW: 13.5 % (ref 11.5–15.5)
WBC: 4.1 10*3/uL (ref 4.0–10.5)
nRBC: 0 % (ref 0.0–0.2)

## 2021-12-22 LAB — TYPE AND SCREEN
ABO/RH(D): A POS
Antibody Screen: NEGATIVE

## 2021-12-22 SURGERY — DILATATION AND CURETTAGE /HYSTEROSCOPY
Anesthesia: General

## 2021-12-22 MED ORDER — PROPOFOL 10 MG/ML IV BOLUS
INTRAVENOUS | Status: DC | PRN
  Administered 2021-12-22: 80 mg via INTRAVENOUS

## 2021-12-22 MED ORDER — FENTANYL CITRATE (PF) 100 MCG/2ML IJ SOLN
INTRAMUSCULAR | Status: AC
  Filled 2021-12-22: qty 2

## 2021-12-22 MED ORDER — PHENYLEPHRINE 80 MCG/ML (10ML) SYRINGE FOR IV PUSH (FOR BLOOD PRESSURE SUPPORT)
PREFILLED_SYRINGE | INTRAVENOUS | Status: DC | PRN
  Administered 2021-12-22 (×2): 80 ug via INTRAVENOUS

## 2021-12-22 MED ORDER — LIDOCAINE HCL (CARDIAC) PF 100 MG/5ML IV SOSY
PREFILLED_SYRINGE | INTRAVENOUS | Status: DC | PRN
  Administered 2021-12-22: 20 mg via INTRAVENOUS

## 2021-12-22 MED ORDER — SILVER NITRATE-POT NITRATE 75-25 % EX MISC
CUTANEOUS | Status: AC
  Filled 2021-12-22: qty 10

## 2021-12-22 MED ORDER — ACETAMINOPHEN 500 MG PO TABS
ORAL_TABLET | ORAL | Status: AC
  Filled 2021-12-22: qty 2

## 2021-12-22 MED ORDER — PHENYLEPHRINE 80 MCG/ML (10ML) SYRINGE FOR IV PUSH (FOR BLOOD PRESSURE SUPPORT)
PREFILLED_SYRINGE | INTRAVENOUS | Status: AC
  Filled 2021-12-22: qty 10

## 2021-12-22 MED ORDER — GABAPENTIN 300 MG PO CAPS
ORAL_CAPSULE | ORAL | Status: AC
  Filled 2021-12-22: qty 1

## 2021-12-22 MED ORDER — BUPIVACAINE HCL (PF) 0.5 % IJ SOLN
INTRAMUSCULAR | Status: AC
  Filled 2021-12-22: qty 30

## 2021-12-22 MED ORDER — FAMOTIDINE 20 MG PO TABS
ORAL_TABLET | ORAL | Status: AC
  Filled 2021-12-22: qty 1

## 2021-12-22 MED ORDER — DEXAMETHASONE SODIUM PHOSPHATE 10 MG/ML IJ SOLN
INTRAMUSCULAR | Status: DC | PRN
  Administered 2021-12-22: 10 mg via INTRAVENOUS

## 2021-12-22 MED ORDER — ONDANSETRON HCL 4 MG/2ML IJ SOLN
INTRAMUSCULAR | Status: DC | PRN
  Administered 2021-12-22: 4 mg via INTRAVENOUS

## 2021-12-22 MED ORDER — CHLORHEXIDINE GLUCONATE 0.12 % MT SOLN
OROMUCOSAL | Status: AC
  Filled 2021-12-22: qty 15

## 2021-12-22 MED ORDER — PROPOFOL 10 MG/ML IV BOLUS
INTRAVENOUS | Status: AC
  Filled 2021-12-22: qty 20

## 2021-12-22 MED ORDER — FENTANYL CITRATE (PF) 100 MCG/2ML IJ SOLN: 25.0000 ug | INTRAMUSCULAR | Status: DC | PRN

## 2021-12-22 MED ORDER — CELECOXIB 200 MG PO CAPS
ORAL_CAPSULE | ORAL | Status: AC
  Filled 2021-12-22: qty 2

## 2021-12-22 MED ORDER — LIDOCAINE HCL (PF) 2 % IJ SOLN
INTRAMUSCULAR | Status: AC
  Filled 2021-12-22: qty 5

## 2021-12-22 MED ORDER — ONDANSETRON HCL 4 MG/2ML IJ SOLN
INTRAMUSCULAR | Status: AC
  Filled 2021-12-22: qty 2

## 2021-12-22 SURGICAL SUPPLY — 20 items
DEVICE MYOSURE LITE (MISCELLANEOUS) IMPLANT
DEVICE MYOSURE REACH (MISCELLANEOUS) IMPLANT
DRSG TELFA 3X8 NADH STRL (GAUZE/BANDAGES/DRESSINGS) IMPLANT
GLOVE BIO SURGEON STRL SZ 6.5 (GLOVE) ×1 IMPLANT
GLOVE SURG UNDER LTX SZ7 (GLOVE) ×1 IMPLANT
GOWN STRL REUS W/ TWL LRG LVL3 (GOWN DISPOSABLE) ×2 IMPLANT
GOWN STRL REUS W/TWL LRG LVL3 (GOWN DISPOSABLE) ×2
KIT PROCEDURE FLUENT (KITS) ×1 IMPLANT
KIT TURNOVER CYSTO (KITS) ×1 IMPLANT
NEEDLE HYPO 22GX1.5 SAFETY (NEEDLE) ×1 IMPLANT
PACK DNC HYST (MISCELLANEOUS) ×1 IMPLANT
PAD OB MATERNITY 4.3X12.25 (PERSONAL CARE ITEMS) ×1 IMPLANT
PAD PREP 24X41 OB/GYN DISP (PERSONAL CARE ITEMS) ×1 IMPLANT
SCRUB CHG 4% DYNA-HEX 4OZ (MISCELLANEOUS) ×1 IMPLANT
SEAL ROD LENS SCOPE MYOSURE (ABLATOR) ×1 IMPLANT
SET CYSTO W/LG BORE CLAMP LF (SET/KITS/TRAYS/PACK) IMPLANT
SOL PREP PVP 2OZ (MISCELLANEOUS) ×1
SOLUTION PREP PVP 2OZ (MISCELLANEOUS) ×1 IMPLANT
SURGILUBE 2OZ TUBE FLIPTOP (MISCELLANEOUS) ×1 IMPLANT
SYR CONTROL 10ML LL (SYRINGE) ×1 IMPLANT

## 2021-12-22 NOTE — Transfer of Care (Signed)
Immediate Anesthesia Transfer of Care Note  Patient: Stephanie [name]  Procedure(s) Performed: DILATATION AND CURETTAGE /HYSTEROSCOPY  Patient Location: PACU  Anesthesia Type:General  Level of Consciousness: drowsy  Airway & Oxygen Therapy: Patient Spontanous Breathing and Patient connected to face mask oxygen  Post-op Assessment: Report given to RN and Post -op Vital signs reviewed and stable  Post vital signs: Reviewed and stable  Last Vitals:  Vitals Value Taken Time  BP 139/85 12/22/21 0852  Temp    Pulse 63 12/22/21 0855  Resp 14 12/22/21 0855  SpO2 100 % 12/22/21 0855  Vitals shown include unvalidated device data.  Last Pain:  Vitals:   12/22/21 0728  PainSc: 6          Complications: No notable events documented.

## 2021-12-22 NOTE — Anesthesia Procedure Notes (Signed)
Procedure Name: LMA Insertion Date/Time: 12/22/2021 7:58 AM  Performed by: Tollie Eth, CRNAPre-anesthesia Checklist: Patient identified, Patient being monitored, Timeout performed, Emergency Drugs available and Suction available Patient Re-evaluated:Patient Re-evaluated prior to induction Oxygen Delivery Method: Circle system utilized Preoxygenation: Pre-oxygenation with 100% oxygen Induction Type: IV induction Ventilation: Mask ventilation without difficulty LMA: LMA inserted LMA Size: 3.0 Tube type: Oral Number of attempts: 1 Placement Confirmation: positive ETCO2 and breath sounds checked- equal and bilateral Tube secured with: Tape Dental Injury: Teeth and Oropharynx as per pre-operative assessment

## 2021-12-22 NOTE — Op Note (Signed)
Procedure(s): DILATATION AND CURETTAGE /HYSTEROSCOPY Procedure Note  Stephanie [name] female 76 y.o. 12/22/2021  Indications: The patient is a 76 y.o. [id number] female with postmenopausal bleeding, suspected endometrial polyp based on ultrasound  Pre-operative Diagnosis: Postmenopausal bleeding, suspected endometrial polyp  Post-operative Diagnosis: Postmenopausal bleeding  Surgeon: Rubie Maid, MD  Assistants:  None.   Anesthesia: General endotracheal anesthesia  Findings: Cervix partially flushed with vaginal wall.  The uterus was sounded to 4.5 cm Atrophic endometrium.  Tubal ostia were visualized bilaterally.  No intrauterine masses.    Procedure Details: The patient was seen in the Holding Room. The risks, benefits, complications, treatment options, and expected outcomes were discussed with the patient.  The patient concurred with the proposed plan, giving informed consent.  The site of surgery properly noted/marked. The patient was taken to the Operating Room, identified as Stephanie [name] and the procedure verified as Procedure(s) (LRB): DILATATION AND CURETTAGE /HYSTEROSCOPY (N/A). A Time Out was held and the above information confirmed.  The patient was then placed under general anesthesia without difficulty.  She was then prepped and draped in the normal sterile fashion, and placed in the dorsal lithotomy position.  A time out was performed.  An exam under anesthesia was performed with the findings noted above.  Straight catheterization was performed. A sterile speculum was inserted into vagina. A single-tooth tenaculum was used to grasp the anterior lip of the cervix. Cervical dilation was performed. A 5 mm hysteroscope was introduced into the uterus under direct visualization. The cavity was allowed to fill, and then the entire cavity was explored with the findings described above. No endometrial polyp identified. The hysteroscope was removed, and a sharp curette was then  passed into the uterus and endometrial sampling was collected for pathology.   All instrument and sponge counts were correct at the end of the procedure x 2.  The patient tolerated the procedure well.  She was awakened and taken to the PACU in stable condition.    Estimated Blood Loss:  minimal      Drains: straight catheterization prior to procedure with 70 ml of clear urine         Total IV Fluids:  700 ml  Specimens: Endometrial curettings (scant)         Implants: None         Complications:  None; patient tolerated the procedure well.         Disposition: PACU - hemodynamically stable.         Condition: stable   Rubie Maid, MD [facility]

## 2021-12-22 NOTE — Anesthesia Postprocedure Evaluation (Signed)
Anesthesia Post Note  Patient: Stephanie [name]  Procedure(s) Performed: DILATATION AND CURETTAGE /HYSTEROSCOPY  Patient location during evaluation: PACU Anesthesia Type: Combined General/Spinal Level of consciousness: awake and alert Pain management: pain level controlled Vital Signs Assessment: post-procedure vital signs reviewed and stable Respiratory status: spontaneous breathing, nonlabored ventilation, respiratory function stable and patient connected to nasal cannula oxygen Cardiovascular status: blood pressure returned to baseline and stable Postop Assessment: no apparent nausea or vomiting Anesthetic complications: no   No notable events documented.   Last Vitals:  Vitals:   12/22/21 0900 12/22/21 0915  BP: (!) 150/76 (!) 152/81  Pulse: 63 64  Resp: 13 12  Temp:    SpO2: 100% 100%    Last Pain:  Vitals:   12/22/21 0915  PainSc: 0-No pain                 Dimas Millin

## 2021-12-22 NOTE — H&P (Signed)
GYNECOLOGY PREOPERATIVE HISTORY AND PHYSICAL   Subjective:  Stephanie [name] is a 76 y.o. [phone] here for surgical management of postmenopausal bleeding.  Ultrasound noting suspected endometrial polyp.  No significant preoperative concerns.  Proposed surgery: Hysteroscopy Dilation and Curettage, Polypectomy    Pertinent Gynecological History: Menses: post-menopausal Bleeding: post menopausal bleeding    Past Medical History:  Diagnosis Date   Anginal pain ([place])    Anxiety    Bipolar 1 disorder ([place])    Bipolar disorder ([place])    Coronary artery disease    Depression    Fatigue    Fibromyalgia    GERD (gastroesophageal reflux disease)    Heart disease    Heart failure (HCC)    HLD (hyperlipidemia)    HOH (hard of hearing)    Hypothyroidism    IBS (irritable bowel syndrome)    Pelvic pain in female    Perianal lesion    high grade sil lesion- keratinizing type   RA (rheumatoid arthritis) ([place])    Raynaud phenomenon    Sjogren's disease ([place])    Sleep apnea    Vaginal Pap smear, abnormal     Past Surgical History:  Procedure Laterality Date   CATARACT EXTRACTION W/PHACO Left 02/23/2017   Procedure: CATARACT EXTRACTION PHACO AND INTRAOCULAR LENS PLACEMENT ([place]);  Surgeon: Birder Robson, MD;  Location: [facility] ORS;  Service: Ophthalmology;  Laterality: Left;  [place] 00:35 AP% 9.6 CDE 3.47 Fluid pack lot # [id number]   CATARACT EXTRACTION W/PHACO Right 03/30/2017   Procedure: CATARACT EXTRACTION PHACO AND INTRAOCULAR LENS PLACEMENT ([facility]);  Surgeon: Birder Robson, MD;  Location: [facility] ORS;  Service: Ophthalmology;  Laterality: Right;  [place] 00:32.6 AP% 13.8 CDE 4.51 Fluid Pack Lot # [id number]   EYE SURGERY     TONSILLECTOMY      OB History  Gravida Para Term Preterm AB Living  '2 2 2     2  '$ SAB IAB Ectopic Multiple Live Births          2    # Outcome Date GA Lbr Len/2nd Weight Sex Delivery Anes PTL Lv  2 Term 1972   4128 g M Vag-Spont   LIV  1 Term 1970   3266  g F Vag-Spont   LIV    Family History  Problem Relation Age of Onset   Heart failure Father    Parkinson's disease Mother    Bipolar disorder Mother    Depression Sister    Schizophrenia Paternal Grandmother    Diabetes Maternal Grandmother    Pancreatic cancer Maternal Grandmother    Cancer Neg Hx    Heart disease Neg Hx    Breast cancer Neg Hx     Social History   Socioeconomic History   Marital status: Divorced    Spouse name: Not on file   Number of children: 2   Years of education: Not on file   Highest education level: Master's degree (e.g., MA, MS, MEng, MEd, MSW, MBA)  Occupational History    Comment: retired2  Tobacco Use   Smoking status: Never   Smokeless tobacco: Former  Scientific laboratory technician Use: Never used  Substance and Sexual Activity   Alcohol use: No    Alcohol/week: 0.0 standard drinks of alcohol   Drug use: No   Sexual activity: Never    Birth control/protection: Post-menopausal  Other Topics Concern   Not on file  Social History Narrative   Lives alone   Social Determinants of Health  Financial Resource Strain: Medium Risk (01/26/2017)   Overall Financial Resource Strain (CARDIA)    Difficulty of Paying Living Expenses: Somewhat hard  Food Insecurity: No Food Insecurity (01/26/2017)   Hunger Vital Sign    Worried About Running Out of Food in the Last Year: Never true    Ran Out of Food in the Last Year: Never true  Transportation Needs: No Transportation Needs (01/26/2017)   PRAPARE - Hydrologist (Medical): No    Lack of Transportation (Non-Medical): No  Physical Activity: Inactive (01/26/2017)   Exercise Vital Sign    Days of Exercise per Week: 0 days    Minutes of Exercise per Session: 0 min  Stress: No Stress Concern Present (01/26/2017)   [place]    Feeling of Stress : Only a little  Social Connections: Unknown (01/26/2017)   Social  Connection and Isolation Panel [NHANES]    Frequency of Communication with Friends and Family: Not on file    Frequency of Social Gatherings with Friends and Family: Not on file    Attends Religious Services: Never    Active Member of Clubs or Organizations: No    Attends Archivist Meetings: Never    Marital Status: Divorced  Human resources officer Violence: Not At Risk (01/26/2017)   Humiliation, Afraid, Rape, and Kick questionnaire    Fear of Current or Ex-Partner: No    Emotionally Abused: No    Physically Abused: No    Sexually Abused: No    No current facility-administered medications on file prior to encounter.   Current Outpatient Medications on File Prior to Encounter  Medication Sig Dispense Refill   carbamazepine (TEGRETOL XR) 200 MG 12 hr tablet TAKE 2 TABLETS BY MOUTH TWICE A DAY 360 tablet 1   divalproex (DEPAKOTE ER) 500 MG 24 hr tablet TAKE 1 TABLET BY MOUTH TWICE A DAY 180 tablet 1   Acetaminophen 500 MG capsule Take 1,000 mg by mouth in the morning, at noon, and at bedtime. tid     aspirin 81 MG chewable tablet Chew 81 mg by mouth daily with breakfast.     Calcium Citrate-Vitamin D 200-250 MG-UNIT TABS Take 1 tablet by mouth daily after breakfast.      ezetimibe (ZETIA) 10 MG tablet Take 1 tablet by mouth daily.     fluticasone (FLONASE) 50 MCG/ACT nasal spray      folic acid (FOLVITE) 1 MG tablet Take 2 mg by mouth daily with breakfast.      gabapentin (NEURONTIN) 300 MG capsule TAKE 1 CAPSULE BY MOUTH TWICE DAILY 180 capsule 1   hypromellose (GENTEAL) 0.3 % GEL ophthalmic ointment Apply to eye.     ibandronate (BONIVA) 150 MG tablet every 30 (thirty) days.     levothyroxine (SYNTHROID, LEVOTHROID) 75 MCG tablet Take 75 mcg by mouth daily before breakfast.      methotrexate 2.5 MG tablet Take 15 mg by mouth every Wednesday. WEDNESDAYS AT LUNCH     Multiple Vitamins-Minerals (MULTIVITAMIN WITH MINERALS) tablet Take 1 tablet by mouth daily with breakfast.       MYRBETRIQ 50 MG TB24 tablet TAKE 1 TABLET BY MOUTH ONCE A DAY 30 tablet 11   nitroGLYCERIN (NITROSTAT) 0.4 MG SL tablet Place under the tongue.     pravastatin (PRAVACHOL) 80 MG tablet Take 1 tablet by mouth at bedtime.     Propylene Glycol 0.6 % SOLN Apply to eye. Takes 4 times a  day, systane     QUEtiapine (SEROQUEL XR) 300 MG 24 hr tablet Take 1 tablet (300 mg total) by mouth at bedtime. 90 tablet 1   QUEtiapine (SEROQUEL XR) 50 MG TB24 24 hr tablet Take 1 tablet (50 mg total) by mouth at bedtime. Take 50 mg at bedtime along with 300 mg - total of 350 mg 90 tablet 1   temazepam (RESTORIL) 7.5 MG capsule Take 1 capsule (7.5 mg total) by mouth at bedtime. (Patient taking differently: Take 7.5 mg by mouth at bedtime as needed.) 30 capsule 5    Allergies  Allergen Reactions   Amoxicillin-Pot Clavulanate Nausea Only and Other (See Comments)    Diarrhea Has patient had a PCN reaction causing immediate rash, facial/tongue/throat swelling, SOB or lightheadedness with hypotension:  Has patient had a PCN reaction causing severe rash involving mucus membranes or skin necrosis: Has patient had a PCN reaction that required hospitalization:  Has patient had a PCN reaction occurring within the last 10 years: If all of the above answers are "NO", then may proceed with Cephalosporin use.    Oxycodone-Acetaminophen Nausea Only   Erythromycin Nausea Only   Azithromycin Nausea And Vomiting   Codeine    Macrolides And Ketolides    Oxycodone-Acetaminophen    Penicillins Other (See Comments)    Has patient had a PCN reaction causing immediate rash, facial/tongue/throat swelling, SOB or lightheadedness with hypotension: NO Has patient had a PCN reaction causing severe rash involving mucus membranes or skin necrosis: NO Has patient had a PCN reaction that required hospitalization: NO Has patient had a PCN reaction occurring within the last 10 years: NO If all of the above answers are "NO", then may proceed  with Cephalosporin use.    Remeron [Mirtazapine]     Side effect-likely tardive dyskinesia   Tramadol Nausea And Vomiting   Zolpidem     Sleep walking   Lamotrigine Rash    LAMICTAL   Latex Rash    Latex bandages   Sulfa Antibiotics Rash     Review of Systems Constitutional: No recent fever/chills/sweats Respiratory: No recent cough/bronchitis Cardiovascular: No chest pain Gastrointestinal: No recent nausea/vomiting/diarrhea Genitourinary: No UTI symptoms Hematologic/lymphatic:No history of coagulopathy or recent blood thinner use    Objective:   Blood pressure (!) 150/89, pulse 72, temperature (!) 97 F (36.1 C), resp. rate 16, height 5' (1.524 m), weight 45.4 kg, SpO2 100 %. CONSTITUTIONAL: Well-developed, well-nourished female in no acute distress.  HENT:  Normocephalic, atraumatic, External right and left ear normal. Oropharynx is clear and moist EYES: Conjunctivae and EOM are normal. Pupils are equal, round, and reactive to light. No scleral icterus.  NECK: Normal range of motion, supple, no masses SKIN: Skin is warm and dry. No rash noted. Not diaphoretic. No erythema. No pallor. NEUROLOGIC: Alert and oriented to person, place, and time. Normal reflexes, muscle tone coordination. No cranial nerve deficit noted. PSYCHIATRIC: Normal mood and affect. Normal behavior. Normal judgment and thought content. CARDIOVASCULAR: Normal heart rate noted, regular rhythm RESPIRATORY: Effort and breath sounds normal, no problems with respiration noted ABDOMEN: Soft, nontender, nondistended. PELVIC: Deferred MUSCULOSKELETAL: Normal range of motion. No edema and no tenderness. 2+ distal pulses.    Labs: Results for orders placed or performed during the hospital encounter of 12/22/21 (from the past 336 hour(s))  CBC   Collection Time: 12/22/21  7:11 AM  Result Value Ref Range   WBC 4.1 4.0 - 10.5 K/uL   RBC 4.06 3.87 - 5.11 MIL/uL  Hemoglobin 14.1 12.0 - 15.0 g/dL   HCT 43.1 36.0  - 46.0 %   MCV 106.2 (H) 80.0 - 100.0 fL   MCH 34.7 (H) 26.0 - 34.0 pg   MCHC 32.7 30.0 - 36.0 g/dL   RDW 13.5 11.5 - 15.5 %   Platelets 189 150 - 400 K/uL   nRBC 0.0 0.0 - 0.2 %     Imaging Studies: US PELVIC COMPLETE WITH TRANSVAGINAL Patient Name: KENDALLYN LIPPOLD DOB: 02-25-1946 MRN: [id number] LMP: postmenopausal   ULTRASOUND REPORT  Location: [facility] Date of Service: 11/17/2021   Indications: postmenopausal bleeding Findings:  The uterus is retroverted and measures 4.7 x 3.3 x 4.6cm Echo texture is homogenous without evidence of focal masses.  The Endometrium measures 2.36m  There is fluid within the endometrium, AP diameter is 4.882mAlong the anterior wall of the endometrium is a possible polyp, 3.4 x  1.26m68mRight Ovary appears normal and measures, 1.5 x 1.1 x 0.9cm Left Ovary is not visualized Survey of the adnexa demonstrates no adnexal masses. There is no free fluid in the cul de sac.  Impression: 1. Fluid within the endometrium 2. Possible endometrial polyp  Recommendations: 1.Clinical correlation with the patient's History and Physical Exam.  KriEdwena BundeDMS, RVT   The ultrasound images and findings were reviewed by me and I agree with  the above report.  DavFinis Bud.D. 11/24/2021 4:47 PM    Assessment:    Postmenopausal bleeding Endometrial polyp   Plan:    Counseling: Procedure, risks, reasons, benefits and complications (including injury to bowel, bladder, major blood vessel, bleeding, possibility of transfusion, infection, or fistula formation) reviewed in detail. Likelihood of success in alleviating the patient's condition was discussed. Routine postoperative instructions will be reviewed with the patient and her family in detail after surgery.  The patient concurred with the proposed plan, giving informed written consent for the surgery.   Preop testing reviewed.  Patient has been NPO since midnight.

## 2021-12-22 NOTE — Anesthesia Preprocedure Evaluation (Signed)
Anesthesia Evaluation  Patient identified by MRN, date of birth, ID band Patient awake    Reviewed: Allergy & Precautions, NPO status , Patient's Chart, lab work & pertinent test results  History of Anesthesia Complications Negative for: history of anesthetic complications  Airway Mallampati: II  TM Distance: >3 FB Neck ROM: limited    Dental  (+) Dental Advidsory Given, Teeth Intact   Pulmonary sleep apnea (mild, no CPAP) , neg COPD, Not current smoker,           Cardiovascular hypertension, Pt. on medications (-) angina+ CAD  (-) Past MI and (-) CHF (-) dysrhythmias (-) Valvular Problems/Murmurs     Neuro/Psych neg Seizures Anxiety Depression Bipolar Disorder  Neuromuscular disease    GI/Hepatic Neg liver ROS, GERD  Medicated and Controlled,  Endo/Other  neg diabetesHypothyroidism   Renal/GU negative Renal ROS     Musculoskeletal   Abdominal   Peds  Hematology   Anesthesia Other Findings   Reproductive/Obstetrics                             Anesthesia Physical  Anesthesia Plan  ASA: 3  Anesthesia Plan: General/Spinal   Post-op Pain Management:    Induction: Intravenous  PONV Risk Score and Plan: 4 or greater and Ondansetron and Dexamethasone  Airway Management Planned: Oral ETT  Additional Equipment:   Intra-op Plan:   Post-operative Plan: Extubation in OR  Informed Consent: I have reviewed the patients History and Physical, chart, labs and discussed the procedure including the risks, benefits and alternatives for the proposed anesthesia with the patient or authorized representative who has indicated his/her understanding and acceptance.     Dental Advisory Given  Plan Discussed with: Anesthesiologist, CRNA and Surgeon  Anesthesia Plan Comments: (Patient consented for risks of anesthesia including but not limited to:  - adverse reactions to medications - damage to  eyes, teeth, lips or other oral mucosa - nerve damage due to positioning  - sore throat or hoarseness - Damage to heart, brain, nerves, lungs, other parts of body or loss of life  Patient voiced understanding.)        Anesthesia Quick Evaluation

## 2021-12-22 NOTE — Discharge Instructions (Signed)

## 2021-12-23 ENCOUNTER — Encounter: Payer: Self-pay | Admitting: Obstetrics and Gynecology

## 2021-12-23 LAB — SURGICAL PATHOLOGY

## 2021-12-29 ENCOUNTER — Telehealth: Payer: Self-pay

## 2021-12-29 NOTE — Telephone Encounter (Signed)
Patient called with concern of bleeding after surgery. Patient has been bleeding x 3 days. Bleeding is heavy. She is soaking pads every 1-2 hours. She is very tired, caregiver said she has no had much rest since after surgery. Advised patient to schedule an appointment for tomorrow per Dr. Marcelline Mates.

## 2021-12-29 NOTE — Telephone Encounter (Signed)
Patient can be seen at 4:15 tomorrow if she can arrange transportation to get here.

## 2021-12-30 ENCOUNTER — Encounter: Payer: Self-pay | Admitting: Obstetrics and Gynecology

## 2021-12-30 ENCOUNTER — Ambulatory Visit (INDEPENDENT_AMBULATORY_CARE_PROVIDER_SITE_OTHER): Payer: Medicare PPO | Admitting: Obstetrics and Gynecology

## 2021-12-30 VITALS — BP 135/73 | HR 77 | Resp 16 | Ht 60.0 in | Wt 102.3 lb

## 2021-12-30 DIAGNOSIS — Z9889 Other specified postprocedural states: Secondary | ICD-10-CM

## 2021-12-30 DIAGNOSIS — Z09 Encounter for follow-up examination after completed treatment for conditions other than malignant neoplasm: Secondary | ICD-10-CM

## 2021-12-30 DIAGNOSIS — N950 Postmenopausal bleeding: Secondary | ICD-10-CM

## 2021-12-30 NOTE — Progress Notes (Signed)
    OBSTETRICS/GYNECOLOGY POST-OPERATIVE CLINIC VISIT  Subjective:     Stephanie [name] is a 76 y.o. female who presents to the clinic 1 weeks status post DILATATION AND CURETTAGE /HYSTEROSCOPY for endometrial polyp, postmenopausal bleeding and suspected endometrial polyp. Eating a regular diet without difficulty. Bowel movements are normal. Pain is controlled with current analgesics. Medications being used: acetaminophen. She had some concerns about bleeding heavy yesterday,  however she reports that her bleeding has slowed down and her pain has resolved.  The following portions of the patient's history were reviewed and updated as appropriate: allergies, current medications, past family history, past medical history, past social history, past surgical history, and problem list.  Review of Systems Pertinent items noted in HPI and remainder of comprehensive ROS otherwise negative.   Objective:   BP 135/73   Pulse 77   Resp 16   Ht 5' (1.524 m)   Wt 102 lb 4.8 oz (46.4 kg)   BMI 19.98 kg/m  Body mass index is 19.98 kg/m.  General:  alert and no distress  Abdomen: soft, bowel sounds active, non-tender  Pelvis:   external genitalia normal, rectovaginal septum normal.  Vagina without discharge or lesions, atrophy present. Cervix normal appearing, healing well, several tenaculum sites noted, no bleeding.  Bimanual exam not performed.     Pathology:  A.  ENDOMETRIAL CURETTINGS:  - SCANT SAMPLING OF ATROPHIC ENDOMETRIUM.  - SMALL STRIPS OF BENIGN SQUAMOUS EPITHELIUM.  - NEGATIVE FOR ATYPIA/EIN AND MALIGNANCY ON THIS LIMITED SPECIMEN.   Assessment:   Patient s/p DILATATION AND CURETTAGE /HYSTEROSCOPY  Doing well postoperatively. PMB  Plan:   1. Continue any current medications as instructed by provider. 2. Wound care discussed. 3. Operative findings reviewed (no evidence of polyp on D&C, photos presented). Pathology report discussed, benign. 4. Activity restrictions: no excessive  bending, stooping, or squatting and no lifting more than 10-15 pounds 5. Anticipated return to work: not applicable. 6. Follow up: as needed.    Rubie Maid, MD [place]

## 2022-01-06 ENCOUNTER — Encounter: Payer: Medicare PPO | Admitting: Obstetrics and Gynecology

## 2022-01-26 ENCOUNTER — Ambulatory Visit (INDEPENDENT_AMBULATORY_CARE_PROVIDER_SITE_OTHER): Payer: Medicare PPO | Admitting: Psychiatry

## 2022-01-26 ENCOUNTER — Encounter: Payer: Self-pay | Admitting: Psychiatry

## 2022-01-26 VITALS — BP 136/83 | HR 71 | Temp 98.2°F | Ht 60.0 in | Wt 102.2 lb

## 2022-01-26 DIAGNOSIS — R413 Other amnesia: Secondary | ICD-10-CM | POA: Diagnosis not present

## 2022-01-26 DIAGNOSIS — F411 Generalized anxiety disorder: Secondary | ICD-10-CM | POA: Diagnosis not present

## 2022-01-26 DIAGNOSIS — F5101 Primary insomnia: Secondary | ICD-10-CM

## 2022-01-26 DIAGNOSIS — F3174 Bipolar disorder, in full remission, most recent episode manic: Secondary | ICD-10-CM | POA: Diagnosis not present

## 2022-01-26 NOTE — Progress Notes (Unsigned)
[facility] MD OP Progress Note  01/26/2022 11:07 AM Stephanie [name]  MRN:  [id number]  Chief Complaint:  Chief Complaint  Patient presents with   Medication Refill   Follow-up   Anxiety   Depression   HPI: Stephanie [name] is a 76 year old Caucasian female, divorced, lives in [place], has a history of multiple medical problems like bipolar disorder, GAD, primary insomnia, TD, Raynaud's phenomena, Sjogren syndrome, hypothyroidism, coronary artery disease, IBS, hyperlipidemia, fibromyalgia, GERD was evaluated in office today.  Patient today notes she is currently doing well with regards to her mood.  Denies any significant depression, anxiety, mood lability.  Reports she has been sleeping well and uses the temazepam as needed only.  Has been compliant with all her psychotropic medications.  Denies side effects.  Patient appeared to be alert, oriented to person place time situation.  3 word memory 3 out of 3 immediately and after 5 minutes 3 out of 3.  Patient with good concentration and recall during the session.  Patient however has been referred to neurology for short-term memory loss.  She does have upcoming appointment coming  this Wednesday.  Patient reports she currently has aid from home instead who helps her around the house and helps with chores.  That has been beneficial.  She however has been discussing with family regarding going to assisted living facility at [facility], sometime in the future.  Patient reports she has to make an appointment with [name] for follow-up therapy.  Denies any suicidality, homicidality or perceptual disturbances.  Reports she recently injured her hand on the right side while she was lifting her recycling bin and the lid fell on her accidentally.  This has been a week ago and currently she reports she does not have any pain.      Visit Diagnosis:    ICD-10-CM   1. Bipolar disorder, in full remission, most recent episode manic ([place])  F31.74      2. GAD (generalized anxiety disorder)  F41.1     3. Primary insomnia  F51.01     4. Memory loss  R41.3       Past Psychiatric History: Reviewed past psychiatric history from progress note on 06/17/2017.  [facility]patient [facility] health admission [facility]/2020 - 01/11/2020.  Past Medical History:  Past Medical History:  Diagnosis Date   Anginal pain ([place])    Anxiety    Bipolar 1 disorder ([place])    Bipolar disorder ([place])    Coronary artery disease    Depression    Fatigue    Fibromyalgia    GERD (gastroesophageal reflux disease)    Heart disease    Heart failure (HCC)    HLD (hyperlipidemia)    HOH (hard of hearing)    Hypothyroidism    IBS (irritable bowel syndrome)    Pelvic pain in female    Perianal lesion    high grade sil lesion- keratinizing type   RA (rheumatoid arthritis) ([place])    Raynaud phenomenon    Sjogren's disease ([place])    Sleep apnea    Vaginal Pap smear, abnormal     Past Surgical History:  Procedure Laterality Date   CATARACT EXTRACTION W/PHACO Left 02/23/2017   Procedure: CATARACT EXTRACTION PHACO AND INTRAOCULAR LENS PLACEMENT ([place]);  Surgeon: Birder Robson, MD;  Location: [facility] ORS;  Service: Ophthalmology;  Laterality: Left;  [place] 00:35 AP% 9.6 CDE 3.47 Fluid pack lot # [id number]   CATARACT EXTRACTION W/PHACO Right 03/30/2017  Procedure: CATARACT EXTRACTION PHACO AND INTRAOCULAR LENS PLACEMENT ([facility]);  Surgeon: Birder Robson, MD;  Location: [facility] ORS;  Service: Ophthalmology;  Laterality: Right;  [place] 00:32.6 AP% 13.8 CDE 4.51 Fluid Pack Lot # [id number]   EYE SURGERY     HYSTEROSCOPY WITH D & C N/A 12/22/2021   Procedure: DILATATION AND CURETTAGE /HYSTEROSCOPY;  Surgeon: Rubie Maid, MD;  Location: [facility] ORS;  Service: Gynecology;  Laterality: N/A;   TONSILLECTOMY      Family Psychiatric History: Reviewed family psychiatric history from progress note on 06/17/2017  Family History:  Family History  Problem Relation  Age of Onset   Heart failure Father    Parkinson's disease Mother    Bipolar disorder Mother    Depression Sister    Schizophrenia Paternal Grandmother    Diabetes Maternal Grandmother    Pancreatic cancer Maternal Grandmother    Cancer Neg Hx    Heart disease Neg Hx    Breast cancer Neg Hx     Social History: Reviewed social history from progress note on 06/17/2017 Social History   Socioeconomic History   Marital status: Divorced    Spouse name: Not on file   Number of children: 2   Years of education: Not on file   Highest education level: Master's degree (e.g., MA, MS, MEng, MEd, MSW, MBA)  Occupational History    Comment: retired2  Tobacco Use   Smoking status: Never   Smokeless tobacco: Former  Scientific laboratory technician Use: Never used  Substance and Sexual Activity   Alcohol use: No    Alcohol/week: 0.0 standard drinks of alcohol   Drug use: No   Sexual activity: Never    Birth control/protection: Post-menopausal  Other Topics Concern   Not on file  Social History Narrative   Lives alone   Social Determinants of Health   Financial Resource Strain: Medium Risk (01/26/2017)   Overall Financial Resource Strain (CARDIA)    Difficulty of Paying Living Expenses: Somewhat hard  Food Insecurity: No Food Insecurity (01/26/2017)   Hunger Vital Sign    Worried About Running Out of Food in the Last Year: Never true    [place] in the Last Year: Never true  Transportation Needs: No Transportation Needs (01/26/2017)   PRAPARE - Hydrologist (Medical): No    Lack of Transportation (Non-Medical): No  Physical Activity: Inactive (01/26/2017)   Exercise Vital Sign    Days of Exercise per Week: 0 days    Minutes of Exercise per Session: 0 min  Stress: No Stress Concern Present (01/26/2017)   [place]    Feeling of Stress : Only a little  Social Connections: Unknown  (01/26/2017)   Social Connection and Isolation Panel [NHANES]    Frequency of Communication with Friends and Family: Not on file    Frequency of Social Gatherings with Friends and Family: Not on file    Attends Religious Services: Never    Marine scientist or Organizations: No    Attends Archivist Meetings: Never    Marital Status: Divorced    Allergies:  Allergies  Allergen Reactions   Amoxicillin-Pot Clavulanate Nausea Only and Other (See Comments)    Diarrhea Has patient had a PCN reaction causing immediate rash, facial/tongue/throat swelling, SOB or lightheadedness with hypotension:  Has patient had a PCN reaction causing severe rash involving mucus membranes or skin necrosis: Has patient had a PCN  reaction that required hospitalization:  Has patient had a PCN reaction occurring within the last 10 years: If all of the above answers are "NO", then may proceed with Cephalosporin use.    Oxycodone-Acetaminophen Nausea Only   Erythromycin Nausea Only   Azithromycin Nausea And Vomiting   Codeine    Macrolides And Ketolides    Oxycodone-Acetaminophen    Penicillins Other (See Comments)    Has patient had a PCN reaction causing immediate rash, facial/tongue/throat swelling, SOB or lightheadedness with hypotension: NO Has patient had a PCN reaction causing severe rash involving mucus membranes or skin necrosis: NO Has patient had a PCN reaction that required hospitalization: NO Has patient had a PCN reaction occurring within the last 10 years: NO If all of the above answers are "NO", then may proceed with Cephalosporin use.    Remeron [Mirtazapine]     Side effect-likely tardive dyskinesia   Tramadol Nausea And Vomiting   Zolpidem     Sleep walking   Lamotrigine Rash    LAMICTAL   Latex Rash    Latex bandages   Sulfa Antibiotics Rash    Metabolic Disorder Labs: Lab Results  Component Value Date   HGBA1C 5.3 11/22/2017   Lab Results  Component Value  Date   PROLACTIN 33.0 (H) 11/22/2017   Lab Results  Component Value Date   CHOL 222 (H) 01/02/2020   TRIG 190 (H) 01/02/2020   HDL 69 01/02/2020   CHOLHDL 3.2 01/02/2020   VLDL 38 01/02/2020   LDLCALC 115 (H) 01/02/2020   LDLCALC 119 (H) 11/22/2017   Lab Results  Component Value Date   TSH 2.678 01/02/2020    Therapeutic Level Labs: No results found for: "LITHIUM" Lab Results  Component Value Date   VALPROATE 90 10/16/2021   VALPROATE 60 01/22/2021   Lab Results  Component Value Date   CBMZ 7.4 10/16/2021   CBMZ 7.3 01/22/2021    Current Medications: Current Outpatient Medications  Medication Sig Dispense Refill   Acetaminophen 500 MG capsule Take 1,000 mg by mouth in the morning, at noon, and at bedtime. tid     aspirin 81 MG chewable tablet Chew 81 mg by mouth daily with breakfast.     Calcium Citrate-Vitamin D 200-250 MG-UNIT TABS Take 1 tablet by mouth daily after breakfast.      carbamazepine (TEGRETOL XR) 200 MG 12 hr tablet TAKE 2 TABLETS BY MOUTH TWICE A DAY 360 tablet 1   divalproex (DEPAKOTE ER) 500 MG 24 hr tablet TAKE 1 TABLET BY MOUTH TWICE A DAY 180 tablet 1   ezetimibe (ZETIA) 10 MG tablet Take 1 tablet by mouth daily.     fluticasone (FLONASE) 50 MCG/ACT nasal spray      folic acid (FOLVITE) 1 MG tablet Take 2 mg by mouth daily with breakfast.      gabapentin (NEURONTIN) 300 MG capsule TAKE 1 CAPSULE BY MOUTH TWICE DAILY 180 capsule 1   hypromellose (GENTEAL) 0.3 % GEL ophthalmic ointment Apply to eye.     ibandronate (BONIVA) 150 MG tablet every 30 (thirty) days.     levothyroxine (SYNTHROID, LEVOTHROID) 75 MCG tablet Take 75 mcg by mouth daily before breakfast.      methotrexate 2.5 MG tablet Take 15 mg by mouth every Wednesday. WEDNESDAYS AT LUNCH- takes differently     Multiple Vitamins-Minerals (MULTIVITAMIN WITH MINERALS) tablet Take 1 tablet by mouth daily with breakfast.      MYRBETRIQ 50 MG TB24 tablet TAKE 1 TABLET BY MOUTH  ONCE A DAY 30  tablet 11   naproxen sodium (ALEVE) 220 MG tablet Take 220 mg by mouth daily as needed.     nitroGLYCERIN (NITROSTAT) 0.4 MG SL tablet Place under the tongue.     pravastatin (PRAVACHOL) 80 MG tablet Take 1 tablet by mouth at bedtime.     Propylene Glycol 0.6 % SOLN Apply to eye. Takes 4 times a day, systane     QUEtiapine (SEROQUEL XR) 300 MG 24 hr tablet Take 1 tablet (300 mg total) by mouth at bedtime. 90 tablet 1   QUEtiapine (SEROQUEL XR) 50 MG TB24 24 hr tablet Take 1 tablet (50 mg total) by mouth at bedtime. Take 50 mg at bedtime along with 300 mg - total of 350 mg 90 tablet 1   temazepam (RESTORIL) 7.5 MG capsule Take 1 capsule (7.5 mg total) by mouth at bedtime. (Patient taking differently: Take 7.5 mg by mouth at bedtime as needed.) 30 capsule 5   No current facility-administered medications for this visit.     Musculoskeletal: Strength & Muscle Tone: within normal limits Gait & Station:  slow Patient leans: N/A  Psychiatric Specialty Exam: Review of Systems  Psychiatric/Behavioral: Negative.    All other systems reviewed and are negative.   Blood pressure 136/83, pulse 71, temperature 98.2 F (36.8 C), temperature source Oral, height 5' (1.524 m), weight 102 lb 3.2 oz (46.4 kg).Body mass index is 19.96 kg/m.  General Appearance: Casual  Eye Contact:  Fair  Speech:  Clear and Coherent  Volume:  Normal  Mood:  Euthymic  Affect:  Appropriate  Thought Process:  Goal Directed and Descriptions of Associations: Intact  Orientation:  Full (Time, Place, and Person)  Thought Content: Logical   Suicidal Thoughts:  No  Homicidal Thoughts:  No  Memory:  Immediate;   Fair Recent;   Fair Remote;   limited  Judgement:  Fair  Insight:  Fair  Psychomotor Activity:  Normal  Concentration:  Concentration: Fair and Attention Span: Fair  Recall:  [employer] of Knowledge: Fair  Language: Fair  Akathisia:  No  Handed:  Right  AIMS (if indicated): done  Assets:  Communication  Skills Desire for Improvement Housing Social Support Transportation  ADL's:  Intact  Cognition: WNL  Sleep:  Fair   Screenings: [place] Office Visit from 11/26/2021 in [place] Visit from 08/26/2021 in [place] Visit from 07/01/2021 in [place] Visit from 03/11/2021 in [place] Visit from 07/17/2020 in [place] Total Score 0 0 0 0 0      AUDIT    Flowsheet Row Admission (Discharged) from 01/02/2020 in [place]  Alcohol Use Disorder Identification Test Final Score (AUDIT) 0      GAD-7    Flowsheet Row Office Visit from 11/26/2021 in [place] Office Visit from 07/17/2020 in [place]  Total GAD-7 Score 9 5      PHQ2-9    [place] Visit from 11/26/2021 in [place] Visit from 08/26/2021 in [place] Visit from 07/01/2021 in [place] from 04/02/2021 in [place] Visit from 03/11/2021 in [place]  PHQ-2 Total Score 2 0 0 2 2  PHQ-9 Total Score '7 5 5 5 '$ 6  Flowsheet Row Pre-Admission Testing 45 from 12/16/2021 in [place] TESTING Office Visit from 11/26/2021 in [facility] Office Visit from 08/26/2021 in [facility] No Risk No Risk No Risk        Assessment and Plan: Stephanie [name] is a 73 year old Caucasian female, divorced, lives in [place], has a history of bipolar disorder, multiple medical problems was evaluated in office today.  Patient is currently improving, will benefit from  the following plan.  Plan Bipolar disorder in remission Seroquel extended release 350 mg p.o. nightly Tegretol extended release 400 mg p.o. twice daily Depakote extended release 500 mg p.o. twice daily Gabapentin 300 mg p.o. twice daily Depakote level-10/16/2021-90-therapeutic Carbamazepine level-7.4-therapeutic-10/16/2021.  GAD-stable Gabapentin 300 mg p.o. twice daily Seroquel is prescribed Continue CBT with [name]  Primary insomnia-stable Temazepam 7.5 mg p.o. nightly as needed  Memory loss-improving SLUMS - 21/30 - 11/26/2021-patient has upcoming appointment with neurology this week.   Will consider getting prolactin level in the future since she is on Seroquel.  Follow-up in clinic in 3 to 4 months or sooner if needed.   This note was generated in part or whole with voice recognition software. Voice recognition is usually quite accurate but there are transcription errors that can and very often do occur. I apologize for any typographical errors that were not detected and corrected.     Ursula Alert, MD 01/26/2022, 11:07 AM

## 2022-02-24 ENCOUNTER — Telehealth: Payer: Self-pay | Admitting: Psychiatry

## 2022-02-24 ENCOUNTER — Encounter: Payer: Self-pay | Admitting: Psychiatry

## 2022-02-24 NOTE — Telephone Encounter (Signed)
Attempted to contact patient, had to leave a voicemail.

## 2022-02-24 NOTE — Telephone Encounter (Signed)
Contacted patient, reports he is currently having relationship struggles, stressors at home.  Has been having some concentration problem and sleep problems lately.  However with the temazepam she has been sleeping well.  Hence she believes maybe she is manic or hypomanic.  Otherwise denies any erratic behaviors. Discussed with patient to take the temazepam every night for now and if she does need a higher dosage of Seroquel in the future we could try increasing it at that time.  I do not recommend going up on the dosage of the Seroquel further at this point.  Advised patient to have more frequent sessions with her therapist.  I contacted [name], patient's therapist, coordinated care.  Her therapist will call her tomorrow.

## 2022-02-24 NOTE — Telephone Encounter (Signed)
Patient called stating that she feels as if she needs her medication increased, the Seroquel. Feels as if she is very manic these past couple of days. Her family can tell a difference in her demeanor. Please advise and can send to [place].

## 2022-03-16 ENCOUNTER — Ambulatory Visit: Payer: Medicare PPO | Admitting: Dermatology

## 2022-03-17 ENCOUNTER — Other Ambulatory Visit: Payer: Self-pay | Admitting: Urology

## 2022-03-19 ENCOUNTER — Ambulatory Visit: Payer: Medicare PPO | Admitting: Dermatology

## 2022-03-19 DIAGNOSIS — Z1283 Encounter for screening for malignant neoplasm of skin: Secondary | ICD-10-CM

## 2022-03-19 DIAGNOSIS — L820 Inflamed seborrheic keratosis: Secondary | ICD-10-CM

## 2022-03-19 DIAGNOSIS — D229 Melanocytic nevi, unspecified: Secondary | ICD-10-CM

## 2022-03-19 DIAGNOSIS — L578 Other skin changes due to chronic exposure to nonionizing radiation: Secondary | ICD-10-CM

## 2022-03-19 DIAGNOSIS — L821 Other seborrheic keratosis: Secondary | ICD-10-CM

## 2022-03-19 DIAGNOSIS — D1801 Hemangioma of skin and subcutaneous tissue: Secondary | ICD-10-CM

## 2022-03-19 DIAGNOSIS — L814 Other melanin hyperpigmentation: Secondary | ICD-10-CM

## 2022-03-19 NOTE — Progress Notes (Signed)
   Follow-Up Visit   Subjective  Stephanie [name] is a 77 y.o. female who presents for the following: Annual Exam. The patient presents for Total-Body Skin Exam (TBSE) for skin cancer screening and mole check.  The patient has spots, moles and lesions to be evaluated, some may be new or changing and the patient has concerns that these could be cancer.   Caregiver Aaliah with patient   The following portions of the chart were reviewed this encounter and updated as appropriate:   Tobacco  Allergies  Meds  Problems  Med Hx  Surg Hx  Fam Hx     Review of Systems:  No other skin or systemic complaints except as noted in HPI or Assessment and Plan.  Objective  Well appearing patient in no apparent distress; mood and affect are within normal limits.  A full examination was performed including scalp, head, eyes, ears, nose, lips, neck, chest, axillae, abdomen, back, buttocks, bilateral upper extremities, bilateral lower extremities, hands, feet, fingers, toes, fingernails, and toenails. All findings within normal limits unless otherwise noted below.  left forearm x 1 Stuck-on, waxy, tan-brown papule -- Discussed benign etiology and prognosis.    Assessment & Plan  Inflamed seborrheic keratosis left forearm x 1  Symptomatic, irritating, patient would like treated.   Destruction of lesion - left forearm x 1 Complexity: simple   Destruction method: cryotherapy   Informed consent: discussed and consent obtained   Timeout:  patient name, date of birth, surgical site, and procedure verified Lesion destroyed using liquid nitrogen: Yes   Region frozen until ice ball extended beyond lesion: Yes   Outcome: patient tolerated procedure well with no complications   Post-procedure details: wound care instructions given    Lentigines - Scattered tan macules - Due to sun exposure - Benign-appearing, observe - Recommend daily broad spectrum sunscreen SPF 30+ to sun-exposed areas, reapply  every 2 hours as needed. - Call for any changes  Seborrheic Keratoses - Stuck-on, waxy, tan-brown papules and/or plaques  - Benign-appearing - Discussed benign etiology and prognosis. - Observe - Call for any changes  Melanocytic Nevi - Tan-brown and/or pink-flesh-colored symmetric macules and papules - Benign appearing on exam today - Observation - Call clinic for new or changing moles - Recommend daily use of broad spectrum spf 30+ sunscreen to sun-exposed areas.   Hemangiomas - Red papules - Discussed benign nature - Observe - Call for any changes  Actinic Damage - Chronic condition, secondary to cumulative UV/sun exposure - diffuse scaly erythematous macules with underlying dyspigmentation - Recommend daily broad spectrum sunscreen SPF 30+ to sun-exposed areas, reapply every 2 hours as needed.  - Staying in the shade or wearing long sleeves, sun glasses (UVA+UVB protection) and wide brim hats (4-inch brim around the entire circumference of the hat) are also recommended for sun protection.  - Call for new or changing lesions.  Skin cancer screening performed today.   Return in about 1 year (around 03/20/2023) for TBSE .  IMarye Round, CMA, am acting as scribe for Sarina Ser, MD .  Documentation: I have reviewed the above documentation for accuracy and completeness, and I agree with the above.  Sarina Ser, MD

## 2022-03-19 NOTE — Patient Instructions (Addendum)
Cryotherapy Aftercare  Wash gently with soap and water everyday.   Apply Vaseline and Band-Aid daily until healed.    Seborrheic Keratosis- treated with liquid nitrogen at the left forearm  What causes seborrheic keratoses? Seborrheic keratoses are harmless, common skin growths that first appear during adult life.  As time goes by, more growths appear.  Some people may develop a large number of them.  Seborrheic keratoses appear on both covered and uncovered body parts.  They are not caused by sunlight.  The tendency to develop seborrheic keratoses can be inherited.  They vary in color from skin-colored to gray, brown, or even black.  They can be either smooth or have a rough, warty surface.   Seborrheic keratoses are superficial and look as if they were stuck on the skin.  Under the microscope this type of keratosis looks like layers upon layers of skin.  That is why at times the top layer may seem to fall off, but the rest of the growth remains and re-grows.    Treatment Seborrheic keratoses do not need to be treated, but can easily be removed in the office.  Seborrheic keratoses often cause symptoms when they rub on clothing or jewelry.  Lesions can be in the way of shaving.  If they become inflamed, they can cause itching, soreness, or burning.  Removal of a seborrheic keratosis can be accomplished by freezing, burning, or surgery. If any spot bleeds, scabs, or grows rapidly, please return to have it checked, as these can be an indication of a skin cancer.         Due to recent changes in healthcare laws, you may see results of your pathology and/or laboratory studies on MyChart before the doctors have had a chance to review them. We understand that in some cases there may be results that are confusing or concerning to you. Please understand that not all results are received at the same time and often the doctors may need to interpret multiple results in order to provide you with the best  plan of care or course of treatment. Therefore, we ask that you please give [place] 2 business days to thoroughly review all your results before contacting the office for clarification. Should we see a critical lab result, you will be contacted sooner.   If You Need Anything After Your Visit  If you have any questions or concerns for your doctor, please call our main line at [phone] and press option 4 to reach your doctor's medical assistant. If no one answers, please leave a voicemail as directed and we will return your call as soon as possible. Messages left after 4 pm will be answered the following business day.   You may also send [place] a message via [place]. We typically respond to MyChart messages within 1-2 business days.  For prescription refills, please ask your pharmacy to contact our office. Our fax number is [phone].  If you have an urgent issue when the clinic is closed that cannot wait until the next business day, you can page your doctor at the number below.    Please note that while we do our best to be available for urgent issues outside of office hours, we are not available 24/7.   If you have an urgent issue and are unable to reach [place], you may choose to seek medical care at your doctor's office, retail clinic, urgent care center, or emergency room.  If you have a medical emergency, please immediately call 911  or go to the emergency department.  Pager Numbers  - Dr. Nehemiah Massed: [phone]  - Dr. Laurence Ferrari: [phone]  - Dr. Nicole Kindred: [phone]  In the event of inclement weather, please call our main line at [phone] for an update on the status of any delays or closures.  Dermatology Medication Tips: Please keep the boxes that topical medications come in in order to help keep track of the instructions about where and how to use these. Pharmacies typically print the medication instructions only on the boxes and not directly on the medication tubes.   If your  medication is too expensive, please contact our office at [phone] option 4 or send [place] a message through [place].   We are unable to tell what your co-pay for medications will be in advance as this is different depending on your insurance coverage. However, we may be able to find a substitute medication at lower cost or fill out paperwork to get insurance to cover a needed medication.   If a prior authorization is required to get your medication covered by your insurance company, please allow [place] 1-2 business days to complete this process.  Drug prices often vary depending on where the prescription is filled and some pharmacies may offer cheaper prices.  The website www.goodrx.com contains coupons for medications through different pharmacies. The prices here do not account for what the cost may be with help from insurance (it may be cheaper with your insurance), but the website can give you the price if you did not use any insurance.  - You can print the associated coupon and take it with your prescription to the pharmacy.  - You may also stop by our office during regular business hours and pick up a GoodRx coupon card.  - If you need your prescription sent electronically to a different pharmacy, notify our office through [facility] or by phone at [phone] option 4.     Si Usted Necesita Algo Despus de Su Visita  Tambin puede enviarnos un mensaje a travs de Pharmacist, community. Por lo general respondemos a los mensajes de MyChart en el transcurso de 1 a 2 das hbiles.  Para renovar recetas, por favor pida a su farmacia que se ponga en contacto con nuestra oficina. Harland Dingwall de fax es [place] [phone].  Si tiene un asunto urgente cuando la clnica est cerrada y que no puede esperar hasta el siguiente da hbil, puede llamar/localizar a su doctor(a) al nmero que aparece a continuacin.   Por favor, tenga en cuenta que aunque hacemos todo lo posible para estar disponibles para  asuntos urgentes fuera del horario de [place], no estamos disponibles las 24 horas del da, los 7 das de la [place].   Si tiene un problema urgente y no puede comunicarse con nosotros, puede optar por buscar atencin mdica  en el consultorio de su doctor(a), en una clnica privada, en un centro de atencin urgente o en una sala de emergencias.  Si tiene Engineering geologist, por favor llame inmediatamente al 911 o vaya a la sala de emergencias.  Nmeros de bper  - Dr. Nehemiah Massed: [phone]  - Dra. Moye: [phone]  - Dra. Nicole Kindred: [phone]  En caso de inclemencias del [place], por favor llame a Johnsie Kindred principal al [phone] para una actualizacin sobre el [place] de cualquier retraso o cierre.  Consejos para la medicacin en dermatologa: Por favor, guarde las cajas en las que vienen los medicamentos de uso tpico para ayudarle a seguir las instrucciones sobre dnde  y cmo usarlos. Las farmacias generalmente imprimen las instrucciones del medicamento slo en las cajas y no directamente en los tubos del [place].   Si su medicamento es muy caro, por favor, pngase en contacto con Zigmund Daniel llamando al [phone] y presione la opcin 4 o envenos un mensaje a travs de Pharmacist, community.   No podemos decirle cul ser su copago por los medicamentos por adelantado ya que esto es diferente dependiendo de la cobertura de su seguro. Sin embargo, es posible que podamos encontrar un medicamento sustituto a Electrical engineer un formulario para que el seguro cubra el medicamento que se considera necesario.   Si se requiere una autorizacin previa para que su compaa de seguros [place] su medicamento, por favor permtanos de 1 a 2 das hbiles para completar este proceso.  Los precios de los medicamentos varan con frecuencia dependiendo del Environmental consultant de dnde se surte la receta y alguna farmacias pueden ofrecer precios ms baratos.  El sitio web www.goodrx.com tiene cupones para  medicamentos de Airline pilot. Los precios aqu no tienen en cuenta lo que podra costar con la ayuda del seguro (puede ser ms barato con su seguro), pero el sitio web puede darle el precio si no utiliz Research scientist (physical sciences).  - Puede imprimir el cupn correspondiente y llevarlo con su receta a la farmacia.  - Tambin puede pasar por nuestra oficina durante el horario de atencin regular y Charity fundraiser una tarjeta de cupones de GoodRx.  - Si necesita que su receta se enve electrnicamente a una farmacia diferente, informe a nuestra oficina a travs de MyChart de [place] o por telfono llamando al [phone] y presione la opcin 4.

## 2022-03-24 ENCOUNTER — Encounter: Payer: Self-pay | Admitting: Dermatology

## 2022-04-10 ENCOUNTER — Other Ambulatory Visit: Payer: Self-pay | Admitting: Psychiatry

## 2022-04-10 DIAGNOSIS — F3174 Bipolar disorder, in full remission, most recent episode manic: Secondary | ICD-10-CM

## 2022-04-21 ENCOUNTER — Telehealth: Payer: Self-pay

## 2022-04-21 NOTE — Telephone Encounter (Signed)
Attempted to contact patient to discuss her concern however had to leave a voicemail.  Will have CMA attempt to contact patient tomorrow.

## 2022-04-21 NOTE — Telephone Encounter (Signed)
Medication management - Attempted to contact patient back after she left a message earlier today that she had been having some increased depression as of late and questioned if she should pick up medications as they are at her pharmacy or if any changes may be needed by Dr. Shea Evans prior.  Patient did not answer and could not leave a message.  Patient is scheduled for return visit on 04/29/22. Patient called right back and wanted Dr. Shea Evans to be aware she was having more depression lately.  Stated if she needed to wait for appointment on 04/29/22 she could but also if she felt Dr. Shea Evans needed to change something before then to let her know.  States she has tried meditation and mindfulness but "I still have this depression".  Wanted Dr. Shea Evans to know this information and to see what she would prefer to do.  Patient requested to let her know if any medications will be adjusted or increased as stated she did tell Dr. Shea Evans she would let her know if those other things were working and that they did not seem to be helping at this time.  Agreed for [place] to call patient back with update if anything changed prior to appointment on 04/29/22 and patient agreed with plan.

## 2022-04-22 ENCOUNTER — Other Ambulatory Visit: Payer: Self-pay | Admitting: Family Medicine

## 2022-04-22 DIAGNOSIS — Z1231 Encounter for screening mammogram for malignant neoplasm of breast: Secondary | ICD-10-CM

## 2022-04-22 NOTE — Telephone Encounter (Signed)
Please let patient know I need to evaluate her prior to making more adjustments. But in the mean time she could talk to Ms.Miguel Dibble and have a therapy session.

## 2022-04-22 NOTE — Telephone Encounter (Signed)
Pt.notified

## 2022-04-22 NOTE — Telephone Encounter (Signed)
pt states she feels her depression has increased and wanted to know if you would increase her medication until she comes to see you next week

## 2022-04-29 ENCOUNTER — Encounter: Payer: Self-pay | Admitting: Psychiatry

## 2022-04-29 ENCOUNTER — Ambulatory Visit (INDEPENDENT_AMBULATORY_CARE_PROVIDER_SITE_OTHER): Payer: Medicare PPO | Admitting: Psychiatry

## 2022-04-29 VITALS — BP 133/83 | HR 83 | Temp 98.8°F | Ht 60.0 in | Wt 102.0 lb

## 2022-04-29 DIAGNOSIS — F3174 Bipolar disorder, in full remission, most recent episode manic: Secondary | ICD-10-CM | POA: Diagnosis not present

## 2022-04-29 DIAGNOSIS — F5101 Primary insomnia: Secondary | ICD-10-CM | POA: Diagnosis not present

## 2022-04-29 DIAGNOSIS — F411 Generalized anxiety disorder: Secondary | ICD-10-CM

## 2022-04-29 NOTE — Progress Notes (Unsigned)
[facility] MD OP Progress Note  04/29/2022 10:29 AM Stephanie [name]  MRN:  [id number]  Chief Complaint:  Chief Complaint  Patient presents with   Follow-up   Depression   Anxiety   Medication Refill   HPI: Stephanie [name] is a 77 year old Caucasian female, divorced, lives in [place], has a history of multiple medical problems like bipolar disorder, GAD, primary insomnia, TD, Raynaud's phenomena, Sjogren syndrome, hypothyroidism, coronary artery disease, IBS, hyperlipidemia, fibromyalgia, GERD was evaluated in office today.  Patient today reports she is currently recovering from flulike symptoms.  She reports she was sick for a few days 1 to 2 weeks ago.  Currently she feels much better although she continues to have tiredness likely from her illness.  Patient however reports she has home instead aid coming in to help her 5 days a week.  She has help available from 9 AM to 1 PM and 3 PM to 7 PM daily.  That has been very helpful.    Patient reports appetite is fair, it depends on the day.  She however is maintaining her weight.  May have had reduced appetite while she was sick.  Patient does feel tired now likely due to recovering from her flulike symptoms.  However does not believe she is depressed.  Does have excessive sleepiness which she describes as going to bed at 8:30 PM and waking up at 6 AM rather than going to bed at 9 PM and waking up at 6 AM like she used to do before.  Hence not a lot of difference in her sleep from what she is reporting.  She does take a 2-hour nap when possible which helps her.  Patient appeared to be alert, oriented to person place time situation.  3 word memory immediate 3 out of 3 after 5 minutes 2 out of 3.  Patient was able to do calculations-serial sevens with some support.  Patient currently denies any suicidality, homicidality or perceptual disturbances.  Motivated to stay in therapy, reports she has upcoming appointment with her therapist [name] on  February 29.  She would like to get back into the gym and start working out.  She has not been able to go to the gym in the past 2 weeks which she believes also could be contributing to her tiredness.  Patient reports she continues to be motivated to do word games like scrabble.  She also reads a lot.  Currently reading this book called the 'art of struggling'.  Reports she is compliant on medications.  Denies side effects.  Denies any other concerns today.  Visit Diagnosis:    ICD-10-CM   1. Bipolar disorder, in full remission, most recent episode manic ([place])  F31.74     2. GAD (generalized anxiety disorder)  F41.1     3. Primary insomnia  F51.01       Past Psychiatric History: Reviewed past theatric history from progress note on 06/17/2017.  [facility]/2020 - 01/11/2020.  Past Medical History:  Past Medical History:  Diagnosis Date   Anginal pain ([place])    Anxiety    Bipolar 1 disorder ([place])    Bipolar disorder ([place])    Coronary artery disease    Depression    Fatigue    Fibromyalgia    GERD (gastroesophageal reflux disease)    Heart disease    Heart failure (HCC)    HLD (hyperlipidemia)    HOH (hard of hearing)  Hypothyroidism    IBS (irritable bowel syndrome)    Pelvic pain in female    Perianal lesion    high grade sil lesion- keratinizing type   RA (rheumatoid arthritis) (HCC)    Raynaud phenomenon    Sjogren's disease ([place])    Sleep apnea    Vaginal Pap smear, abnormal     Past Surgical History:  Procedure Laterality Date   CATARACT EXTRACTION W/PHACO Left 02/23/2017   Procedure: CATARACT EXTRACTION PHACO AND INTRAOCULAR LENS PLACEMENT ([place]);  Surgeon: Birder Robson, MD;  Location: [facility] ORS;  Service: Ophthalmology;  Laterality: Left;  [place] 00:35 AP% 9.6 CDE 3.47 Fluid pack lot # [id number]   CATARACT EXTRACTION W/PHACO Right 03/30/2017   Procedure: CATARACT EXTRACTION PHACO AND INTRAOCULAR  LENS PLACEMENT ([facility]);  Surgeon: Birder Robson, MD;  Location: [facility] ORS;  Service: Ophthalmology;  Laterality: Right;  [place] 00:32.6 AP% 13.8 CDE 4.51 Fluid Pack Lot # [id number]   EYE SURGERY     HYSTEROSCOPY WITH D & C N/A 12/22/2021   Procedure: DILATATION AND CURETTAGE /HYSTEROSCOPY;  Surgeon: Rubie Maid, MD;  Location: [facility] ORS;  Service: Gynecology;  Laterality: N/A;   TONSILLECTOMY      Family Psychiatric History: Reviewed family psych history from progress note on 06/17/2017.  Family History:  Family History  Problem Relation Age of Onset   Heart failure Father    Parkinson's disease Mother    Bipolar disorder Mother    Depression Sister    Schizophrenia Paternal Grandmother    Diabetes Maternal Grandmother    Pancreatic cancer Maternal Grandmother    Cancer Neg Hx    Heart disease Neg Hx    Breast cancer Neg Hx     Social History: Reviewed social history from progress note on 06/17/2017. Social History   Socioeconomic History   Marital status: Divorced    Spouse name: Not on file   Number of children: 2   Years of education: Not on file   Highest education level: Master's degree (e.g., MA, MS, MEng, MEd, MSW, MBA)  Occupational History    Comment: retired2  Tobacco Use   Smoking status: Never   Smokeless tobacco: Former  Scientific laboratory technician Use: Never used  Substance and Sexual Activity   Alcohol use: No    Alcohol/week: 0.0 standard drinks of alcohol   Drug use: No   Sexual activity: Never    Birth control/protection: Post-menopausal  Other Topics Concern   Not on file  Social History Narrative   Lives alone   Social Determinants of Health   Financial Resource Strain: Medium Risk (01/26/2017)   Overall Financial Resource Strain (CARDIA)    Difficulty of Paying Living Expenses: Somewhat hard  Food Insecurity: No Food Insecurity (01/26/2017)   Hunger Vital Sign    Worried About Running Out of Food in the Last Year: Never true    [place] in  the Last Year: Never true  Transportation Needs: No Transportation Needs (01/26/2017)   PRAPARE - Hydrologist (Medical): No    Lack of Transportation (Non-Medical): No  Physical Activity: Inactive (01/26/2017)   Exercise Vital Sign    Days of Exercise per Week: 0 days    Minutes of Exercise per Session: 0 min  Stress: No Stress Concern Present (01/26/2017)   [place]    Feeling of Stress : Only a little  Social Connections: Unknown (01/26/2017)   Social Connection  and Isolation Panel [NHANES]    Frequency of Communication with Friends and Family: Not on file    Frequency of Social Gatherings with Friends and Family: Not on file    Attends Religious Services: Never    Active Member of Clubs or Organizations: No    Attends Archivist Meetings: Never    Marital Status: Divorced    Allergies:  Allergies  Allergen Reactions   Amoxicillin-Pot Clavulanate Nausea Only and Other (See Comments)    Diarrhea Has patient had a PCN reaction causing immediate rash, facial/tongue/throat swelling, SOB or lightheadedness with hypotension:  Has patient had a PCN reaction causing severe rash involving mucus membranes or skin necrosis: Has patient had a PCN reaction that required hospitalization:  Has patient had a PCN reaction occurring within the last 10 years: If all of the above answers are "NO", then may proceed with Cephalosporin use.    Oxycodone-Acetaminophen Nausea Only   Erythromycin Nausea Only   Azithromycin Nausea And Vomiting   Codeine    Macrolides And Ketolides    Oxycodone-Acetaminophen    Penicillins Other (See Comments)    Has patient had a PCN reaction causing immediate rash, facial/tongue/throat swelling, SOB or lightheadedness with hypotension: NO Has patient had a PCN reaction causing severe rash involving mucus membranes or skin necrosis: NO Has patient had a PCN  reaction that required hospitalization: NO Has patient had a PCN reaction occurring within the last 10 years: NO If all of the above answers are "NO", then may proceed with Cephalosporin use.    Remeron [Mirtazapine]     Side effect-likely tardive dyskinesia   Tramadol Nausea And Vomiting   Zolpidem     Sleep walking   Lamotrigine Rash    LAMICTAL   Latex Rash    Latex bandages   Sulfa Antibiotics Rash    Metabolic Disorder Labs: Lab Results  Component Value Date   HGBA1C 5.3 11/22/2017   Lab Results  Component Value Date   PROLACTIN 33.0 (H) 11/22/2017   Lab Results  Component Value Date   CHOL 222 (H) 01/02/2020   TRIG 190 (H) 01/02/2020   HDL 69 01/02/2020   CHOLHDL 3.2 01/02/2020   VLDL 38 01/02/2020   LDLCALC 115 (H) 01/02/2020   LDLCALC 119 (H) 11/22/2017   Lab Results  Component Value Date   TSH 2.678 01/02/2020    Therapeutic Level Labs: No results found for: "LITHIUM" Lab Results  Component Value Date   VALPROATE 90 10/16/2021   VALPROATE 60 01/22/2021   Lab Results  Component Value Date   CBMZ 7.4 10/16/2021   CBMZ 7.3 01/22/2021    Current Medications: Current Outpatient Medications  Medication Sig Dispense Refill   Acetaminophen 500 MG capsule Take 1,000 mg by mouth in the morning, at noon, and at bedtime. tid     aspirin 81 MG chewable tablet Chew 81 mg by mouth daily with breakfast.     Calcium Citrate-Vitamin D 200-250 MG-UNIT TABS Take 1 tablet by mouth daily after breakfast.      carbamazepine (TEGRETOL XR) 200 MG 12 hr tablet TAKE 2 TABLETS BY MOUTH TWICE A DAY 360 tablet 1   divalproex (DEPAKOTE ER) 500 MG 24 hr tablet TAKE 1 TABLET BY MOUTH TWICE A DAY 180 tablet 1   ezetimibe (ZETIA) 10 MG tablet Take 1 tablet by mouth daily.     fluticasone (FLONASE) 50 MCG/ACT nasal spray      folic acid (FOLVITE) 1 MG tablet Take 2  mg by mouth daily with breakfast.      gabapentin (NEURONTIN) 300 MG capsule TAKE 1 CAPSULE BY MOUTH TWICE DAILY 180  capsule 1   hypromellose (GENTEAL) 0.3 % GEL ophthalmic ointment Apply to eye.     ibandronate (BONIVA) 150 MG tablet every 30 (thirty) days.     levothyroxine (SYNTHROID, LEVOTHROID) 75 MCG tablet Take 75 mcg by mouth daily before breakfast.      methotrexate 2.5 MG tablet Take 15 mg by mouth every Wednesday. WEDNESDAYS AT LUNCH- takes differently     Multiple Vitamins-Minerals (MULTIVITAMIN WITH MINERALS) tablet Take 1 tablet by mouth daily with breakfast.      MYRBETRIQ 50 MG TB24 tablet TAKE 1 TABLET BY MOUTH ONCE A DAY 30 tablet 11   naproxen sodium (ALEVE) 220 MG tablet Take 220 mg by mouth daily as needed.     nitroGLYCERIN (NITROSTAT) 0.4 MG SL tablet Place under the tongue.     Propylene Glycol 0.6 % SOLN Apply to eye. Takes 4 times a day, systane     QUEtiapine (SEROQUEL XR) 300 MG 24 hr tablet Take 1 tablet (300 mg total) by mouth at bedtime. 90 tablet 1   QUEtiapine (SEROQUEL XR) 50 MG TB24 24 hr tablet Take 1 tablet (50 mg total) by mouth at bedtime. Take 50 mg at bedtime along with 300 mg - total of 350 mg 90 tablet 1   temazepam (RESTORIL) 7.5 MG capsule Take 1 capsule (7.5 mg total) by mouth at bedtime. (Patient taking differently: Take 7.5 mg by mouth at bedtime as needed.) 30 capsule 5   pravastatin (PRAVACHOL) 80 MG tablet Take 1 tablet by mouth at bedtime.     No current facility-administered medications for this visit.     Musculoskeletal: Strength & Muscle Tone: within normal limits Gait & Station:  slow Patient leans: N/A  Psychiatric Specialty Exam: Review of Systems  Musculoskeletal:  Positive for arthralgias.  Psychiatric/Behavioral:  Positive for sleep disturbance. The patient is nervous/anxious.   All other systems reviewed and are negative.   Blood pressure 133/83, pulse 83, temperature 98.8 F (37.1 C), temperature source Temporal, height 5' (1.524 m), weight 102 lb (46.3 kg).Body mass index is 19.92 kg/m.  General Appearance: Casual  Eye Contact:  Fair   Speech:  Clear and Coherent  Volume:  Normal  Mood:  Anxious  Affect:  Appropriate  Thought Process:  Goal Directed and Descriptions of Associations: Intact  Orientation:  Full (Time, Place, and Person)  Thought Content: Logical   Suicidal Thoughts:  No  Homicidal Thoughts:  No  Memory:  Immediate;   Fair Recent;   Fair Remote;   Fair  Judgement:  Fair  Insight:  Fair  Psychomotor Activity:  Normal  Concentration:  Concentration: Fair and Attention Span: Fair  Recall:  [employer] of Knowledge: Fair  Language: Fair  Akathisia:  No  Handed:  Right  AIMS (if indicated): done  Assets:  Communication Skills Desire for Improvement Social Support  ADL's:  Intact  Cognition: WNL  Sleep:   excessive at times   Screenings: [place] Office Visit from 04/29/2022 in [facility] Office Visit from 01/26/2022 in [facility] Office Visit from 11/26/2021 in [facility] Office Visit from 08/26/2021 in [facility] Office Visit from 07/01/2021 in [place] [facility]  AIMS Total Score 1 0 0 0  0      AUDIT    Flowsheet Row Admission (Discharged) from 01/02/2020 in [facility]  Alcohol Use Disorder Identification Test Final Score (AUDIT) 0      GAD-7    Flowsheet Row Office Visit from 04/29/2022 in [facility] [facility] Office Visit from 01/26/2022 in [facility] Office Visit from 11/26/2021 in [facility] Office Visit from 07/17/2020 in [facility]  Total GAD-7 Score 7 4 9 5      $ PHQ2-9    [place] Office Visit from 04/29/2022 in [facility] Office Visit from  01/26/2022 in [facility] Office Visit from 11/26/2021 in [place] Office Visit from 08/26/2021 in [facility] Office Visit from 07/01/2021 in [facility]  PHQ-2 Total Score 3 0 2 0 0  PHQ-9 Total Score 9 -- 7 5 5      $ [place] Office Visit from 04/29/2022 in [facility] Office Visit from 01/26/2022 in [facility] Testing 45 from 12/16/2021 in [facility] TESTING  C-SSRS RISK CATEGORY No Risk No Risk No Risk        Assessment and Plan: Stephanie [name] is a 5 year old Caucasian female, divorced, lives in [place], has a history of bipolar disorder, multiple medical problems was evaluated in office today.  Patient is currently stable although does report tiredness likely due to recent flulike symptoms.  Plan as noted below.  Plan Bipolar disorder in remission Seroquel extended release 350 mg p.o. nightly Tegretol extended release 400 mg p.o. twice daily Depakote extended release 500 mg p.o. twice daily Gabapentin 300 mg p.o. twice daily Depakote level-10/16/2021-90-therapeutic Carbamazepine level-7.4-therapeutic-10/16/2021.  GAD-stable Gabapentin 300 mg p.o. twice daily Seroquel is prescribed Continue CBT with Ms. Hollie Salk upcoming appointment on February 29.  I have coordinated care with Ms. Grandville Silos.  Primary insomnia-stable Temazepam 7.5 mg p.o. nightly as needed Patient advised to limit use if possible. Reviewed [place] PMP AWARxE   Follow-up in clinic in 2 months or sooner if needed.  This note was generated in part or whole with voice recognition software. Voice recognition is usually quite accurate but there are transcription errors that can and very often do occur. I apologize for  any typographical errors that were not detected and corrected.      Ursula Alert, MD 04/30/2022, 8:13 AM

## 2022-05-28 ENCOUNTER — Ambulatory Visit
Admission: RE | Admit: 2022-05-28 | Discharge: 2022-05-28 | Disposition: A | Discharge status: Home / Self Care | Payer: Medicare PPO | Source: Ambulatory Visit | Attending: Family Medicine | Admitting: Family Medicine

## 2022-05-28 DIAGNOSIS — Z1231 Encounter for screening mammogram for malignant neoplasm of breast: Secondary | ICD-10-CM | POA: Diagnosis present

## 2022-06-01 ENCOUNTER — Other Ambulatory Visit: Payer: Self-pay | Admitting: Psychiatry

## 2022-06-01 DIAGNOSIS — Z79899 Other long term (current) drug therapy: Secondary | ICD-10-CM

## 2022-06-01 DIAGNOSIS — R413 Other amnesia: Secondary | ICD-10-CM

## 2022-06-18 ENCOUNTER — Other Ambulatory Visit: Payer: Self-pay | Admitting: Psychiatry

## 2022-06-18 DIAGNOSIS — F5101 Primary insomnia: Secondary | ICD-10-CM

## 2022-06-29 ENCOUNTER — Ambulatory Visit (INDEPENDENT_AMBULATORY_CARE_PROVIDER_SITE_OTHER): Payer: Medicare PPO | Admitting: Psychiatry

## 2022-06-29 ENCOUNTER — Telehealth: Payer: Self-pay

## 2022-06-29 ENCOUNTER — Encounter: Payer: Self-pay | Admitting: Psychiatry

## 2022-06-29 VITALS — BP 156/80 | HR 74 | Temp 97.8°F | Ht 60.0 in | Wt 100.6 lb

## 2022-06-29 DIAGNOSIS — R413 Other amnesia: Secondary | ICD-10-CM | POA: Diagnosis not present

## 2022-06-29 DIAGNOSIS — F411 Generalized anxiety disorder: Secondary | ICD-10-CM | POA: Diagnosis not present

## 2022-06-29 DIAGNOSIS — F3174 Bipolar disorder, in full remission, most recent episode manic: Secondary | ICD-10-CM | POA: Diagnosis not present

## 2022-06-29 DIAGNOSIS — F5101 Primary insomnia: Secondary | ICD-10-CM | POA: Diagnosis not present

## 2022-06-29 MED ORDER — QUETIAPINE FUMARATE ER 300 MG PO TB24: 300.0000 mg | ORAL_TABLET | Freq: Every day | ORAL | 1 refills | Status: DC

## 2022-06-29 MED ORDER — GABAPENTIN 300 MG PO CAPS: ORAL_CAPSULE | ORAL | 1 refills | Status: DC

## 2022-06-29 NOTE — Progress Notes (Signed)
BH MD OP Progress Note  06/29/2022 1:18 PM Stephanie [name]  MRN:  [id number]  Chief Complaint:  Chief Complaint  Patient presents with   Follow-up   Depression   Manic Behavior   Medication Refill   HPI: Stephanie [name] is a 77 year old Caucasian female, divorced, lives in [place], has a history of bipolar disorder, GAD, primary insomnia, memory loss, GAD, Raynaud's phenomena, Sjogren syndrome, hypothyroidism, coronary artery disease, IBS, hyperlipidemia, fibromyalgia, GERD was evaluated in office today.  Patient today reports overall mood wise she is doing fairly well.  She continues to be compliant on medications as prescribed.  Denies any side effects.  Patient reports sleep as good overall.  She does take a 2-hour nap in the afternoon.  Although she wakes up in the middle of the night she is able to fall back asleep.  She does feel rested when she wakes up in the morning.  She reports appetite is overall fair.  She continues to get her meals from [employer] program.  She has been maintaining her weight.  Patient however does report vertigo/dizziness especially when she wakes up in the morning.  She is currently working with physical therapist.  She used to be on meclizine before however not using it anymore.  Agreeable to talk to her primary care provider.  Patient does struggle with short-term memory loss although she believes she has been maintaining okay.  She does have  Home instead support, on a regular basis.  That has been beneficial.  She does not drive anymore.  A SLUMS was completed in session today as noted below.  Patient denies any side effects to medications.  Patient denies any suicidality, homicidality or perceptual disturbances.  Patient denies any other concerns today.  She continues to follow-up with her therapist Ms. Felecia Jan.  Visit Diagnosis:    ICD-10-CM   1. Bipolar disorder, in full remission, most recent episode manic  F31.74     2. GAD  (generalized anxiety disorder)  F41.1 QUEtiapine (SEROQUEL XR) 300 MG 24 hr tablet    3. Primary insomnia  F51.01 QUEtiapine (SEROQUEL XR) 300 MG 24 hr tablet    4. Memory loss  R41.3 gabapentin (NEURONTIN) 300 MG capsule      Past Psychiatric History: I have reviewed past psychiatric history from progress note on 06/17/2017.  Inpatient [facility] health admission at [facility]-01/02/2020 - 01/11/2020.  Past Medical History:  Past Medical History:  Diagnosis Date   Anginal pain    Anxiety    Bipolar 1 disorder    Bipolar disorder    Coronary artery disease    Depression    Fatigue    Fibromyalgia    GERD (gastroesophageal reflux disease)    Heart disease    Heart failure    HLD (hyperlipidemia)    HOH (hard of hearing)    Hypothyroidism    IBS (irritable bowel syndrome)    Pelvic pain in female    Perianal lesion    high grade sil lesion- keratinizing type   RA (rheumatoid arthritis)    Raynaud phenomenon    Sjogren's disease    Sleep apnea    Vaginal Pap smear, abnormal     Past Surgical History:  Procedure Laterality Date   CATARACT EXTRACTION W/PHACO Left 02/23/2017   Procedure: CATARACT EXTRACTION PHACO AND INTRAOCULAR LENS PLACEMENT (IOC);  Surgeon: Galen Manila, MD;  Location: [facility] ORS;  Service: Ophthalmology;  Laterality: Left;  [place] 00:35 AP% 9.6 CDE  3.47 Fluid pack lot # [id number]   CATARACT EXTRACTION W/PHACO Right 03/30/2017   Procedure: CATARACT EXTRACTION PHACO AND INTRAOCULAR LENS PLACEMENT (IOC);  Surgeon: Galen Manila, MD;  Location: [facility] ORS;  Service: Ophthalmology;  Laterality: Right;  [place] 00:32.6 AP% 13.8 CDE 4.51 Fluid Pack Lot # [id number]   EYE SURGERY     HYSTEROSCOPY WITH D & C N/A 12/22/2021   Procedure: DILATATION AND CURETTAGE /HYSTEROSCOPY;  Surgeon: Hildred Laser, MD;  Location: [facility] ORS;  Service: Gynecology;  Laterality: N/A;   TONSILLECTOMY      Family Psychiatric History: I have reviewed family psychiatric  history from progress note on 06/17/2017.  Family History:  Family History  Problem Relation Age of Onset   Heart failure Father    Parkinson's disease Mother    Bipolar disorder Mother    Depression Sister    Schizophrenia Paternal Grandmother    Diabetes Maternal Grandmother    Pancreatic cancer Maternal Grandmother    Cancer Neg Hx    Heart disease Neg Hx    Breast cancer Neg Hx     Social History: I have reviewed social history from progress note on 06/17/2017. Social History   Socioeconomic History   Marital status: Divorced    Spouse name: Not on file   Number of children: 2   Years of education: Not on file   Highest education level: Master's degree (e.g., MA, MS, MEng, MEd, MSW, MBA)  Occupational History    Comment: retired2  Tobacco Use   Smoking status: Never   Smokeless tobacco: Former  Building services engineer Use: Never used  Substance and Sexual Activity   Alcohol use: No    Alcohol/week: 0.0 standard drinks of alcohol   Drug use: No   Sexual activity: Never    Birth control/protection: Post-menopausal  Other Topics Concern   Not on file  Social History Narrative   Lives alone   Social Determinants of Health   Financial Resource Strain: Medium Risk (01/26/2017)   Overall Financial Resource Strain (CARDIA)    Difficulty of Paying Living Expenses: Somewhat hard  Food Insecurity: No Food Insecurity (01/26/2017)   Hunger Vital Sign    Worried About Running Out of Food in the Last Year: Never true    Ran Out of Food in the Last Year: Never true  Transportation Needs: No Transportation Needs (01/26/2017)   PRAPARE - Administrator, Civil Service (Medical): No    Lack of Transportation (Non-Medical): No  Physical Activity: Inactive (01/26/2017)   Exercise Vital Sign    Days of Exercise per Week: 0 days    Minutes of Exercise per Session: 0 min  Stress: No Stress Concern Present (01/26/2017)   [employer] of Occupational Health -  Occupational Stress Questionnaire    Feeling of Stress : Only a little  Social Connections: Unknown (01/26/2017)   Social Connection and Isolation Panel [NHANES]    Frequency of Communication with Friends and Family: Not on file    Frequency of Social Gatherings with Friends and Family: Not on file    Attends Religious Services: Never    Database administrator or Organizations: No    Attends Banker Meetings: Never    Marital Status: Divorced    Allergies:  Allergies  Allergen Reactions   Amoxicillin-Pot Clavulanate Nausea Only and Other (See Comments)    Diarrhea Has patient had a PCN reaction causing immediate rash, facial/tongue/throat swelling, SOB or lightheadedness with hypotension:  Has  patient had a PCN reaction causing severe rash involving mucus membranes or skin necrosis: Has patient had a PCN reaction that required hospitalization:  Has patient had a PCN reaction occurring within the last 10 years: If all of the above answers are "NO", then may proceed with Cephalosporin use.    Oxycodone-Acetaminophen Nausea Only   Erythromycin Nausea Only   Azithromycin Nausea And Vomiting   Codeine    Macrolides And Ketolides    Oxycodone-Acetaminophen    Penicillins Other (See Comments)    Has patient had a PCN reaction causing immediate rash, facial/tongue/throat swelling, SOB or lightheadedness with hypotension: NO Has patient had a PCN reaction causing severe rash involving mucus membranes or skin necrosis: NO Has patient had a PCN reaction that required hospitalization: NO Has patient had a PCN reaction occurring within the last 10 years: NO If all of the above answers are "NO", then may proceed with Cephalosporin use.    Remeron [Mirtazapine]     Side effect-likely tardive dyskinesia   Tramadol Nausea And Vomiting   Zolpidem     Sleep walking   Lamotrigine Rash    LAMICTAL   Latex Rash    Latex bandages   Sulfa Antibiotics Rash    Metabolic Disorder  Labs: Lab Results  Component Value Date   HGBA1C 5.3 11/22/2017   Lab Results  Component Value Date   PROLACTIN 33.0 (H) 11/22/2017   Lab Results  Component Value Date   CHOL 222 (H) 01/02/2020   TRIG 190 (H) 01/02/2020   HDL 69 01/02/2020   CHOLHDL 3.2 01/02/2020   VLDL 38 01/02/2020   LDLCALC 115 (H) 01/02/2020   LDLCALC 119 (H) 11/22/2017   Lab Results  Component Value Date   TSH 2.678 01/02/2020    Therapeutic Level Labs: No results found for: "LITHIUM" Lab Results  Component Value Date   VALPROATE 90 10/16/2021   VALPROATE 60 01/22/2021   Lab Results  Component Value Date   CBMZ 7.4 10/16/2021   CBMZ 7.3 01/22/2021    Current Medications: Current Outpatient Medications  Medication Sig Dispense Refill   Acetaminophen 500 MG capsule Take 1,000 mg by mouth in the morning, at noon, and at bedtime. tid     aspirin 81 MG chewable tablet Chew 81 mg by mouth daily with breakfast.     Calcium Citrate-Vitamin D 200-250 MG-UNIT TABS Take 1 tablet by mouth daily after breakfast.      carbamazepine (TEGRETOL XR) 200 MG 12 hr tablet TAKE 2 TABLETS BY MOUTH TWICE A DAY 360 tablet 1   divalproex (DEPAKOTE ER) 500 MG 24 hr tablet TAKE ONE TABLET BY MOUTH TWICE A DAY 180 tablet 1   fluticasone (FLONASE) 50 MCG/ACT nasal spray      folic acid (FOLVITE) 1 MG tablet Take 2 mg by mouth daily with breakfast.      hypromellose (GENTEAL) 0.3 % GEL ophthalmic ointment Apply to eye.     ibandronate (BONIVA) 150 MG tablet every 30 (thirty) days.     levothyroxine (SYNTHROID, LEVOTHROID) 75 MCG tablet Take 75 mcg by mouth daily before breakfast.      methotrexate 2.5 MG tablet Take 15 mg by mouth every Wednesday. WEDNESDAYS AT LUNCH- takes differently     Multiple Vitamins-Minerals (MULTIVITAMIN WITH MINERALS) tablet Take 1 tablet by mouth daily with breakfast.      MYRBETRIQ 50 MG TB24 tablet TAKE 1 TABLET BY MOUTH ONCE A DAY 30 tablet 11   naproxen sodium (ALEVE) 220 MG  tablet Take 220  mg by mouth daily as needed.     nitroGLYCERIN (NITROSTAT) 0.4 MG SL tablet Place under the tongue.     Propylene Glycol 0.6 % SOLN Apply to eye. Takes 4 times a day, systane     QUEtiapine (SEROQUEL XR) 50 MG TB24 24 hr tablet TAKE ONE TABLET BY MOUTH AT BEDTIME ALONG WITH 300 MG. TOTAL OF 350 MG 30 tablet 1   temazepam (RESTORIL) 7.5 MG capsule TAKE ONE CAPSULE BY MOUTH ONCE A DAY AT BEDTIME 30 capsule 5   ezetimibe (ZETIA) 10 MG tablet Take 1 tablet by mouth daily.     gabapentin (NEURONTIN) 300 MG capsule TAKE 1 CAPSULE BY MOUTH TWICE DAILY 180 capsule 1   pravastatin (PRAVACHOL) 80 MG tablet Take 1 tablet by mouth at bedtime.     QUEtiapine (SEROQUEL XR) 300 MG 24 hr tablet Take 1 tablet (300 mg total) by mouth at bedtime. 90 tablet 1   No current facility-administered medications for this visit.     Musculoskeletal: Strength & Muscle Tone: within normal limits Gait & Station: normal Patient leans: N/A  Psychiatric Specialty Exam: Review of Systems  Neurological:        Vertigo on and off  Psychiatric/Behavioral:  The patient is nervous/anxious.   All other systems reviewed and are negative.   Blood pressure (!) 156/80, pulse 74, temperature 97.8 F (36.6 C), temperature source Skin, height 5' (1.524 m), weight 100 lb 9.6 oz (45.6 kg).Body mass index is 19.65 kg/m.  General Appearance: Casual  Eye Contact:  Fair  Speech:  Normal Rate  Volume:  Normal  Mood:  Anxious  Affect:  Appropriate  Thought Process:  Goal Directed and Descriptions of Associations: Intact  Orientation:  Full (Time, Place, and Person)  Thought Content: Logical   Suicidal Thoughts:  No  Homicidal Thoughts:  No  Memory:  Immediate;   Fair Recent;   Fair Remote;   Fair  Judgement:  Fair  Insight:  Fair  Psychomotor Activity:  Normal  Concentration:  Concentration: Fair and Attention Span: Fair  Recall:  [employer] of Knowledge: Fair  Language: Fair  Akathisia:  No  Handed:  Right  AIMS (if  indicated): done  Assets:  Communication Skills Desire for Improvement Social Support Transportation  ADL's:  Intact  Cognition: WNL  Sleep:  Fair   Screenings: Midwife Visit from 06/29/2022 in [facility] Office Visit from 04/29/2022 in [facility] Office Visit from 01/26/2022 in [facility] Office Visit from 11/26/2021 in [facility] Office Visit from 08/26/2021 in [facility]  AIMS Total Score 0 1 0 0 0      AUDIT    Flowsheet Row Admission (Discharged) from 01/02/2020 in [facility]  Alcohol Use Disorder Identification Test Final Score (AUDIT) 0      GAD-7    Flowsheet Row Office Visit from 04/29/2022 in [facility] Office Visit from 01/26/2022 in [facility] Office Visit from 11/26/2021 in [facility] Office Visit from 07/17/2020 in [facility]  Total GAD-7 Score 7 4 9 5       PHQ2-9    Flowsheet Row Office Visit from 06/29/2022 in [facility] Office Visit from 04/29/2022 in [facility] Office Visit from 01/26/2022 in [facility] Office Visit from 11/26/2021 in [facility] Office Visit from 08/26/2021 in [facility]  PHQ-2 Total Score 0 3 0 2 0  PHQ-9 Total Score 5 9 -- 7 5      Flowsheet Row Office Visit from 06/29/2022 in [facility] Office Visit from 04/29/2022 in [facility] Office Visit from  01/26/2022 in [facility]  C-SSRS RISK CATEGORY No Risk No Risk No Risk        Assessment and Plan: MACIL CRADY is a 77 year old Caucasian female, divorced, lives in [place], has a history of bipolar disorder and multiple medical problems was evaluated in office today.  Patient is currently overall stable with regards to her mood although she does have short-term memory problems likely multifactorial, discussed plan as noted below.  Plan Bipolar disorder in remission Seroquel extended release 350 mg p.o. nightly Tegretol extended release 400 mg p.o. twice daily Depakote extended release 500 mg p.o. twice daily Gabapentin 300 mg p.o. twice daily Depakote level-10/16/2021-90-therapeutic Carbamazepine level-7.4-therapeutic-10/16/2021.  GAD-stable Gabapentin 300 mg p.o. twice daily Seroquel as prescribed Continue CBT with Ms. Felecia Jan I have coordinated care.  Primary insomnia-stable Temazepam 7.5 mg p.o. nightly as needed Reviewed [place] PMP AWARxE  Memory loss-likely multifactorial-patient completed SLUMS in session today and scored  24/30.  Patient encouraged to continue to read, social interaction, word search, puzzles, scrabble, eating healthier, continue to exercise regularly.  Patient currently in physical therapy which has been beneficial.  Patient with vertigo/dizziness-advised to follow up with primary care provider.  May also need an ENT evaluation.  This was discussed.  Patient with elevated blood pressure reading in session today, reports blood pressure likely elevated since she is at the doctor's office.  Patient decline today's blood pressure routed to her primary care provider.    Follow-up in clinic in 3 months or sooner if needed.    Collaboration of Care: Collaboration of Care: Primary Care Provider AEB encouraged to follow up with primary care provider for vertigo/elevated blood pressure reading and Referral or follow-up  with counselor/therapist AEB patient encouraged to continue CBT with Ms. Felecia Jan.  Patient/Guardian was advised Release of Information must be obtained prior to any record release in order to collaborate their care with an outside provider. Patient/Guardian was advised if they have not already done so to contact the registration department to sign all necessary forms in order for [place] to release information regarding their care.   Consent: Patient/Guardian gives verbal consent for treatment and assignment of benefits for services provided during this visit. Patient/Guardian expressed understanding and agreed to proceed.   This note was generated in part or whole with voice recognition software. Voice recognition is usually quite accurate but there are transcription errors that can and very often do occur. I apologize for any typographical errors that were not detected and corrected.    Jomarie Longs, MD 06/29/2022, 1:18 PM

## 2022-06-29 NOTE — Telephone Encounter (Signed)
Patient in today for an appt with Dr Elna Breslow her B/P was elevated made patient aware of B/P asked if she could monitor her B/P at home she stated that she did not want to get into that and also asked that I not seen over this information to her PCP

## 2022-07-02 ENCOUNTER — Other Ambulatory Visit: Payer: Self-pay | Admitting: Psychiatry

## 2022-07-02 DIAGNOSIS — F3174 Bipolar disorder, in full remission, most recent episode manic: Secondary | ICD-10-CM

## 2022-07-21 ENCOUNTER — Telehealth: Payer: Self-pay

## 2022-07-21 NOTE — Telephone Encounter (Signed)
pt left a message that she wantd to take prevagen a OTC medication for memory issues. Wants to know if she can take that with her other medications.

## 2022-07-21 NOTE — Telephone Encounter (Signed)
Prevagen  is a dietary supplement which has an ingredient called Apoaequorin , which is a protein found in jellyfish and claims to improve mild memory loss linked to aging.  However since we do not have a lot of data available regarding this medication as well as interactions, hard to say whether it will interact with any of the medications .

## 2022-07-21 NOTE — Telephone Encounter (Signed)
Pt.notified

## 2022-09-27 ENCOUNTER — Emergency Department
Admission: EM | Admit: 2022-09-27 | Discharge: 2022-09-27 | Disposition: A | Discharge status: Home / Self Care | Payer: Medicare PPO | Attending: Emergency Medicine | Admitting: Emergency Medicine

## 2022-09-27 ENCOUNTER — Emergency Department: Payer: Medicare PPO

## 2022-09-27 ENCOUNTER — Other Ambulatory Visit: Payer: Self-pay

## 2022-09-27 DIAGNOSIS — I2510 Atherosclerotic heart disease of native coronary artery without angina pectoris: Secondary | ICD-10-CM | POA: Insufficient documentation

## 2022-09-27 DIAGNOSIS — R1031 Right lower quadrant pain: Secondary | ICD-10-CM | POA: Diagnosis present

## 2022-09-27 DIAGNOSIS — I10 Essential (primary) hypertension: Secondary | ICD-10-CM | POA: Diagnosis not present

## 2022-09-27 DIAGNOSIS — E039 Hypothyroidism, unspecified: Secondary | ICD-10-CM | POA: Insufficient documentation

## 2022-09-27 LAB — CBC
HCT: 41.8 % (ref 36.0–46.0)
Hemoglobin: 13.9 g/dL (ref 12.0–15.0)
MCH: 35 pg — ABNORMAL HIGH (ref 26.0–34.0)
MCHC: 33.3 g/dL (ref 30.0–36.0)
MCV: 105.3 fL — ABNORMAL HIGH (ref 80.0–100.0)
Platelets: 140 10*3/uL — ABNORMAL LOW (ref 150–400)
RBC: 3.97 MIL/uL (ref 3.87–5.11)
RDW: 13.2 % (ref 11.5–15.5)
WBC: 3.5 10*3/uL — ABNORMAL LOW (ref 4.0–10.5)
nRBC: 0 % (ref 0.0–0.2)

## 2022-09-27 LAB — COMPREHENSIVE METABOLIC PANEL
ALT: 15 U/L (ref 0–44)
AST: 26 U/L (ref 15–41)
Albumin: 4.1 g/dL (ref 3.5–5.0)
Alkaline Phosphatase: 50 U/L (ref 38–126)
Anion gap: 9 (ref 5–15)
BUN: 19 mg/dL (ref 8–23)
CO2: 26 mmol/L (ref 22–32)
Calcium: 8.8 mg/dL — ABNORMAL LOW (ref 8.9–10.3)
Chloride: 104 mmol/L (ref 98–111)
Creatinine, Ser: 0.52 mg/dL (ref 0.44–1.00)
GFR, Estimated: >60 mL/min (ref >60)
Glucose, Bld: 90 mg/dL (ref 70–99)
Potassium: 4.1 mmol/L (ref 3.5–5.1)
Sodium: 139 mmol/L (ref 135–145)
Total Bilirubin: 0.6 mg/dL (ref 0.3–1.2)
Total Protein: 6.6 g/dL (ref 6.5–8.1)

## 2022-09-27 LAB — URINALYSIS, ROUTINE W REFLEX MICROSCOPIC
Bilirubin Urine: NEGATIVE
Glucose, UA: NEGATIVE mg/dL
Hgb urine dipstick: NEGATIVE
Ketones, ur: NEGATIVE mg/dL
Leukocytes,Ua: NEGATIVE
Nitrite: NEGATIVE
Protein, ur: NEGATIVE mg/dL
Specific Gravity, Urine: 1.014 (ref 1.005–1.030)
pH: 7 (ref 5.0–8.0)

## 2022-09-27 LAB — LIPASE, BLOOD: Lipase: 50 U/L (ref 11–51)

## 2022-09-27 MED ORDER — IOHEXOL 300 MG/ML  SOLN
75.0000 mL | Freq: Once | INTRAMUSCULAR | Status: AC | PRN
  Administered 2022-09-27: 75 mL via INTRAVENOUS

## 2022-09-27 NOTE — ED Triage Notes (Signed)
Pt states coming in with right flank pain and around the belly button that started several days ago. Pt denies chest pain or shortness of breath.

## 2022-09-27 NOTE — Discharge Instructions (Signed)
Your laboratory testing, CT imaging, and urinalysis are all normal.  We suspect that your pain will get better within the next couple of days.  If your pain worsens, or if you develop a fever, vomiting, change in your bowel movements, blood in your urine or burning when you urinate, or develop any other new, worsening, or change in symptoms or other concerns, please return the emergency department.  Otherwise, please follow-up with your primary care provider.  It was a pleasure caring for you today.

## 2022-09-27 NOTE — ED Provider Notes (Signed)
[facility] Provider Note    Event Date/Time   First MD Initiated Contact with Patient 09/27/22 1107     (approximate)   History   Abdominal Pain   HPI  Stephanie [name] is a 77 y.o. female with a past medical history of bipolar disorder, hyperlipidemia, generalized anxiety disorder, somatoform disorder, who presents today for evaluation of right lower quadrant pain that began yesterday.  She reports that it is intermittent, worse with walking and improved with laying down.  She has not taken anything for her symptoms.  She denies nausea or vomiting.  No fevers or chills.  No diarrhea.  No urinary symptoms.  She denies blood in her stool.  Patient Active Problem List   Diagnosis Date Noted   Bleeding 10/16/2021   Low platelet count (HCC) 10/15/2021   Bipolar 1 disorder, manic, mild (HCC) 06/23/2021   At risk for prolonged QT interval syndrome 06/23/2021   Elevated blood pressure reading 03/11/2021   Sleep apnea 09/30/2020   Bipolar 1 disorder, depressed (HCC) 06/14/2020   Primary insomnia 05/09/2020   Cognitive complaints 05/09/2020   Tardive dyskinesia 01/18/2020   Bipolar 1 disorder, manic, moderate (HCC) 01/02/2020   Personal history of affective disorder 09/06/2019   Bipolar I disorder, most recent episode (or current) manic (HCC) 11/02/2018   Insomnia due to mental disorder 11/02/2018   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/20/2017   Chest pain 05/17/2015   OP (osteoporosis) 02/25/2015   Sjogren's syndrome (HCC) 02/20/2015   Combined fat and carbohydrate induced hyperlipemia 10/03/2014   Breathlessness on exertion 09/24/2014   Bipolar 1 disorder with moderate mania (HCC) 08/29/2014   Bipolar disorder, in full remission, most recent episode manic (HCC) 08/29/2014   H/O gastrointestinal disease 08/29/2014   GAD (generalized anxiety disorder) 08/29/2014   Fibromyalgia 08/29/2014   H/O elevated lipids 08/29/2014   H/O: hypothyroidism 08/29/2014   Insomnia,  persistent 08/29/2014   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   Chronic pain associated with significant psychosocial dysfunction 08/29/2014   Gougerout-Sjoegren syndrome 08/29/2014   Severe somatoform disorder 08/29/2014   Rheumatoid arthritis (HCC) 08/29/2014   Benign essential HTN 01/10/2014   3-vessel CAD 01/10/2014   Polypharmacy 06/29/2013   High risk medication use 06/29/2013   Arthritis or polyarthritis, rheumatoid (HCC) 06/22/2013   Acid reflux 05/05/2012   Adult hypothyroidism 01/07/2012   Flu vaccine need 01/07/2012   Cough 12/14/2011   CD (contact dermatitis) 10/15/2011   Finger wound, simple, open 10/15/2011          Physical Exam   Triage Vital Signs: ED Triage Vitals  Encounter Vitals Group     BP 09/27/22 0944 (!) 150/79     Systolic BP Percentile --      Diastolic BP Percentile --      Pulse Rate 09/27/22 0944 79     Resp 09/27/22 0944 20     Temp 09/27/22 0944 98.2 F (36.8 C)     Temp src --      SpO2 09/27/22 0944 92 %     Weight 09/27/22 0945 103 lb (46.7 kg)     Height 09/27/22 0945 4\' 9"  (1.448 m)     Head Circumference --      Peak Flow --      Pain Score 09/27/22 0945 3     Pain Loc --      Pain Education --      Exclude from Growth Chart --     Most recent vital  signs: Vitals:   09/27/22 1307 09/27/22 1315  BP: (!) 183/87   Pulse: 65 62  Resp: 18   Temp:    SpO2: 97% 98%    Physical Exam Vitals and nursing note reviewed.  Constitutional:      General: Awake and alert. No acute distress.    Appearance: Normal appearance. The patient is normal weight.  HENT:     Head: Normocephalic and atraumatic.     Mouth: Mucous membranes are moist.  Eyes:     General: PERRL. Normal EOMs        Right eye: No discharge.        Left eye: No discharge.     Conjunctiva/sclera: Conjunctivae normal.  Cardiovascular:     Rate and Rhythm: Normal rate and regular rhythm.     Pulses: Normal pulses.  Pulmonary:     Effort: Pulmonary  effort is normal. No respiratory distress.     Breath sounds: Normal breath sounds.  Abdominal:     Abdomen is soft. There is very mild right lower quadrant and suprapubic abdominal tenderness. No rebound or guarding. No distention. Musculoskeletal:        General: No swelling. Normal range of motion.     Cervical back: Normal range of motion and neck supple.  Skin:    General: Skin is warm and dry.     Capillary Refill: Capillary refill takes less than 2 seconds.     Findings: No rash.  Neurological:     Mental Status: The patient is awake and alert.      ED Results / Procedures / Treatments   Labs (all labs ordered are listed, but only abnormal results are displayed) Labs Reviewed  COMPREHENSIVE METABOLIC PANEL - Abnormal; Notable for the following components:      Result Value   Calcium 8.8 (*)    All other components within normal limits  CBC - Abnormal; Notable for the following components:   WBC 3.5 (*)    MCV 105.3 (*)    MCH 35.0 (*)    Platelets 140 (*)    All other components within normal limits  URINALYSIS, ROUTINE W REFLEX MICROSCOPIC - Abnormal; Notable for the following components:   Color, Urine YELLOW (*)    APPearance HAZY (*)    All other components within normal limits  LIPASE, BLOOD     EKG     RADIOLOGY I independently reviewed and interpreted imaging and agree with radiologists findings.     PROCEDURES:  Critical Care performed:   Procedures   MEDICATIONS ORDERED IN ED: Medications  iohexol (OMNIPAQUE) 300 MG/ML solution 75 mL (75 mLs Intravenous Contrast Given 09/27/22 1239)     IMPRESSION / MDM / ASSESSMENT AND PLAN / ED COURSE  I reviewed the triage vital signs and the nursing notes.   Differential diagnosis includes, but is not limited to, appendicitis, urinary tract infection, nephrolithiasis, colitis.  I reviewed the patient's chart.  Patient is followed closely by Dr. Allena Katz with rheumatology for her rheumatoid arthritis.   She has not had any recent emergency department visits.  Patient is awake and alert, hemodynamically stable and afebrile.  Further workup is indicated.  Labs obtained are overall quite reassuring.  No leukocytosis, normal LFTs and lipase.  Urinalysis does not suggest infection.  Patient declined analgesia, reports that her pain is mild and she has no pain with lying down.  She was able to ambulate to the bathroom by herself.  CT scan is reassuring, no  acute findings.  Recommended close outpatient follow-up with her PCP, and strict return precautions.  Patient understands and agrees with plan.  She was discharged in stable condition.  Patient's presentation is most consistent with acute complicated illness / injury requiring diagnostic workup.      FINAL CLINICAL IMPRESSION(S) / ED DIAGNOSES   Final diagnoses:  Right lower quadrant abdominal pain     Rx / DC Orders   ED Discharge Orders     None        Note:  This document was prepared using Dragon voice recognition software and may include unintentional dictation errors.   Keturah Shavers 09/27/22 1455    Shaune Pollack, MD 09/27/22 1945

## 2022-09-28 ENCOUNTER — Telehealth: Payer: Self-pay | Admitting: Psychiatry

## 2022-09-28 ENCOUNTER — Ambulatory Visit: Payer: Medicare PPO | Admitting: Psychiatry

## 2022-09-28 NOTE — Telephone Encounter (Signed)
Thank you for the update!

## 2022-09-28 NOTE — Telephone Encounter (Signed)
Patient called stating she has abdominal pain and is hospitalized. Patient will call back to reschedule appointment. Patient should not be charged a no show fee

## 2022-10-14 ENCOUNTER — Other Ambulatory Visit: Payer: Self-pay | Admitting: Psychiatry

## 2022-10-14 DIAGNOSIS — Z79899 Other long term (current) drug therapy: Secondary | ICD-10-CM

## 2022-10-27 ENCOUNTER — Telehealth: Payer: Self-pay

## 2022-10-27 NOTE — Telephone Encounter (Signed)
Pt called triage line stating that myrbetriq is costing her too much and would like a cheaper option. Please advise.

## 2022-10-28 NOTE — Telephone Encounter (Signed)
Pt called triage line this morning, states $120 would be too much for her to pay for myrbetriq.

## 2022-11-03 NOTE — Telephone Encounter (Signed)
Pt called triage line this morning stating she is going to stop talking myrbetriq due to the cost. See previous mychart message for more information.

## 2022-11-04 ENCOUNTER — Telehealth: Payer: Self-pay | Admitting: Urology

## 2022-11-04 ENCOUNTER — Other Ambulatory Visit: Payer: Self-pay | Admitting: Urology

## 2022-11-04 DIAGNOSIS — N3941 Urge incontinence: Secondary | ICD-10-CM

## 2022-11-04 MED ORDER — TOLTERODINE TARTRATE ER 4 MG PO CP24: 4.0000 mg | ORAL_CAPSULE | Freq: Every day | ORAL | 3 refills | Status: DC

## 2022-11-04 NOTE — Telephone Encounter (Signed)
Pt wants to know if if she can get something different instead of Myrbetriq sent to [facility].  She would like to get the best of the 2.  Oxybutynin Tolterodine

## 2022-11-06 NOTE — Telephone Encounter (Signed)
Spoke with patient and advised results Appt scheduled 

## 2022-11-06 NOTE — Telephone Encounter (Signed)
Harle Battiest, PA-C  to Me     11/04/22  4:25 PM I sent a prescription for the tolterodine.  I would like to see her in 6 weeks so that we can see how she is doing on the new medication.

## 2022-11-12 ENCOUNTER — Ambulatory Visit (INDEPENDENT_AMBULATORY_CARE_PROVIDER_SITE_OTHER): Payer: Medicare PPO | Admitting: Psychiatry

## 2022-11-12 ENCOUNTER — Encounter: Payer: Self-pay | Admitting: Psychiatry

## 2022-11-12 VITALS — BP 164/91 | HR 74 | Temp 97.6°F | Ht <= 58 in | Wt 104.0 lb

## 2022-11-12 DIAGNOSIS — F411 Generalized anxiety disorder: Secondary | ICD-10-CM

## 2022-11-12 DIAGNOSIS — R413 Other amnesia: Secondary | ICD-10-CM

## 2022-11-12 DIAGNOSIS — F3174 Bipolar disorder, in full remission, most recent episode manic: Secondary | ICD-10-CM

## 2022-11-12 DIAGNOSIS — Z79899 Other long term (current) drug therapy: Secondary | ICD-10-CM | POA: Diagnosis not present

## 2022-11-12 DIAGNOSIS — F5101 Primary insomnia: Secondary | ICD-10-CM

## 2022-11-12 DIAGNOSIS — G2401 Drug induced subacute dyskinesia: Secondary | ICD-10-CM

## 2022-11-12 MED ORDER — DIVALPROEX SODIUM ER 500 MG PO TB24
ORAL_TABLET | ORAL | 1 refills | Status: DC
Start: 2022-11-12 — End: 2023-05-27

## 2022-11-12 NOTE — Progress Notes (Signed)
BH MD OP Progress Note  11/12/2022 4:33 PM Stephanie [name]  MRN:  [id number]  Chief Complaint:  Chief Complaint  Patient presents with   Follow-up   Depression   Anxiety   Medication Refill   HPI: Stephanie [name] is a 77 year old Caucasian female, divorced, lives in [place] has a history of bipolar disorder, GAD, primary insomnia, memory loss, GAD, Raynaud's phenomena, Sjogren syndrome, hypothyroidism, coronary artery disease, IBS, hyperlipidemia, fibromyalgia, GERD was evaluated in office today.  Patient today reports she is currently struggling with abdominal pain.  She went to the emergency department on 09/27/2022.  At that time she had CT scan of her abdomen as well as labs done and was medically cleared and released to her primary care provider.  Patient reports she continues to have pain of her abdomen radiating from the left side down to the right side and then to her groin.  She does have upcoming appointment with urology.  She also agrees to reach out to primary care provider for further recommendations and possibly a referral to a specialist.  Patient reports she is anxious about her abdominal pain otherwise denies any significant changes with her mood symptoms on the current medication regimen.  Agreeable to continuation of current medications.  Patient reports sleep is good.  She continues to take Seroquel and temazepam.  Temazepam is as needed.  She tries to limit use.  Patient does report that her therapist brought to her attention that she may have some movements of her tongue.  Very minimal movements observed around her mouth/tongue during the session.  Patient reports she is not aware of it and it does not distress her.  Patient declines any medication changes or addition of medications to target the tardive dyskinesia symptoms.  Patient denies any suicidality, homicidality or perceptual disturbances.  Patient appeared to be alert, oriented to person place time  situation.  Patient denies any other concerns today.  Visit Diagnosis:    ICD-10-CM   1. Bipolar disorder, in full remission, most recent episode manic (HCC)  F31.74 Carbamazepine level, total    Valproic acid level    2. GAD (generalized anxiety disorder)  F41.1 Valproic acid level    3. Primary insomnia  F51.01     4. High risk medication use  Z79.899 Carbamazepine level, total    Valproic acid level    Platelet count    5. Memory loss  R41.3 divalproex (DEPAKOTE ER) 500 MG 24 hr tablet    6. Tardive dyskinesia  G24.01       Past Psychiatric History: I have reviewed past psychiatric history from progress note on 06/17/2017.  Inpatient mental health admission at [facility]-01/02/2020 - 01/11/2020.  Past Medical History:  Past Medical History:  Diagnosis Date   Anginal pain (HCC)    Anxiety    Bipolar 1 disorder (HCC)    Bipolar disorder (HCC)    Coronary artery disease    Depression    Fatigue    Fibromyalgia    GERD (gastroesophageal reflux disease)    Heart disease    Heart failure (HCC)    HLD (hyperlipidemia)    HOH (hard of hearing)    Hypothyroidism    IBS (irritable bowel syndrome)    Pelvic pain in female    Perianal lesion    high grade sil lesion- keratinizing type   RA (rheumatoid arthritis) (HCC)    Raynaud phenomenon    Sjogren's disease (HCC)    Sleep apnea  Vaginal Pap smear, abnormal     Past Surgical History:  Procedure Laterality Date   CATARACT EXTRACTION W/PHACO Left 02/23/2017   Procedure: CATARACT EXTRACTION PHACO AND INTRAOCULAR LENS PLACEMENT (IOC);  Surgeon: Galen Manila, MD;  Location: [facility] ORS;  Service: Ophthalmology;  Laterality: Left;  [place] 00:35 AP% 9.6 CDE 3.47 Fluid pack lot # [id number]   CATARACT EXTRACTION W/PHACO Right 03/30/2017   Procedure: CATARACT EXTRACTION PHACO AND INTRAOCULAR LENS PLACEMENT (IOC);  Surgeon: Galen Manila, MD;  Location: [facility] ORS;  Service: Ophthalmology;  Laterality:  Right;  [place] 00:32.6 AP% 13.8 CDE 4.51 Fluid Pack Lot # [id number]   EYE SURGERY     HYSTEROSCOPY WITH D & C N/A 12/22/2021   Procedure: DILATATION AND CURETTAGE /HYSTEROSCOPY;  Surgeon: Hildred Laser, MD;  Location: [facility] ORS;  Service: Gynecology;  Laterality: N/A;   TONSILLECTOMY      Family Psychiatric History: I have reviewed family psychiatric history from progress note on 06/17/2017.  Family History:  Family History  Problem Relation Age of Onset   Heart failure Father    Parkinson's disease Mother    Bipolar disorder Mother    Depression Sister    Schizophrenia Paternal Grandmother    Diabetes Maternal Grandmother    Pancreatic cancer Maternal Grandmother    Cancer Neg Hx    Heart disease Neg Hx    Breast cancer Neg Hx     Social History: I have reviewed social history from progress note on 06/17/2017. Social History   Socioeconomic History   Marital status: Divorced    Spouse name: Not on file   Number of children: 2   Years of education: Not on file   Highest education level: Master's degree (e.g., MA, MS, MEng, MEd, MSW, MBA)  Occupational History    Comment: retired2  Tobacco Use   Smoking status: Never   Smokeless tobacco: Former  Building services engineer status: Never Used  Substance and Sexual Activity   Alcohol use: No    Alcohol/week: 0.0 standard drinks of alcohol   Drug use: No   Sexual activity: Never    Birth control/protection: Post-menopausal  Other Topics Concern   Not on file  Social History Narrative   Lives alone   Social Determinants of Health   Financial Resource Strain: Medium Risk (01/26/2017)   Overall Financial Resource Strain (CARDIA)    Difficulty of Paying Living Expenses: Somewhat hard  Food Insecurity: No Food Insecurity (01/26/2017)   Hunger Vital Sign    Worried About Running Out of Food in the Last Year: Never true    Ran Out of Food in the Last Year: Never true  Transportation Needs: No Transportation Needs (01/26/2017)    PRAPARE - Administrator, Civil Service (Medical): No    Lack of Transportation (Non-Medical): No  Physical Activity: Inactive (01/26/2017)   Exercise Vital Sign    Days of Exercise per Week: 0 days    Minutes of Exercise per Session: 0 min  Stress: No Stress Concern Present (01/26/2017)   [employer] of Occupational Health - Occupational Stress Questionnaire    Feeling of Stress : Only a little  Social Connections: Unknown (01/26/2017)   Social Connection and Isolation Panel [NHANES]    Frequency of Communication with Friends and Family: Not on file    Frequency of Social Gatherings with Friends and Family: Not on file    Attends Religious Services: Never    Active Member of Clubs or Organizations: No  Attends Banker Meetings: Never    Marital Status: Divorced    Allergies:  Allergies  Allergen Reactions   Amoxicillin-Pot Clavulanate Nausea Only and Other (See Comments)    Diarrhea Has patient had a PCN reaction causing immediate rash, facial/tongue/throat swelling, SOB or lightheadedness with hypotension:  Has patient had a PCN reaction causing severe rash involving mucus membranes or skin necrosis: Has patient had a PCN reaction that required hospitalization:  Has patient had a PCN reaction occurring within the last 10 years: If all of the above answers are "NO", then may proceed with Cephalosporin use.    Oxycodone-Acetaminophen Nausea Only   Erythromycin Nausea Only   Azithromycin Nausea And Vomiting   Codeine    Macrolides And Ketolides    Oxycodone-Acetaminophen    Penicillins Other (See Comments)    Has patient had a PCN reaction causing immediate rash, facial/tongue/throat swelling, SOB or lightheadedness with hypotension: NO Has patient had a PCN reaction causing severe rash involving mucus membranes or skin necrosis: NO Has patient had a PCN reaction that required hospitalization: NO Has patient had a PCN reaction occurring within  the last 10 years: NO If all of the above answers are "NO", then may proceed with Cephalosporin use.    Remeron [Mirtazapine]     Side effect-likely tardive dyskinesia   Tramadol Nausea And Vomiting   Zolpidem     Sleep walking   Lamotrigine Rash    LAMICTAL   Latex Rash    Latex bandages   Sulfa Antibiotics Rash    Metabolic Disorder Labs: Lab Results  Component Value Date   HGBA1C 5.3 11/22/2017   Lab Results  Component Value Date   PROLACTIN 33.0 (H) 11/22/2017   Lab Results  Component Value Date   CHOL 222 (H) 01/02/2020   TRIG 190 (H) 01/02/2020   HDL 69 01/02/2020   CHOLHDL 3.2 01/02/2020   VLDL 38 01/02/2020   LDLCALC 115 (H) 01/02/2020   LDLCALC 119 (H) 11/22/2017   Lab Results  Component Value Date   TSH 2.678 01/02/2020    Therapeutic Level Labs: No results found for: "LITHIUM" Lab Results  Component Value Date   VALPROATE 90 10/16/2021   VALPROATE 60 01/22/2021   Lab Results  Component Value Date   CBMZ 7.4 10/16/2021   CBMZ 7.3 01/22/2021    Current Medications: Current Outpatient Medications  Medication Sig Dispense Refill   Acetaminophen 500 MG capsule Take 1,000 mg by mouth in the morning, at noon, and at bedtime. tid     aspirin 81 MG chewable tablet Chew 81 mg by mouth daily with breakfast.     Calcium Citrate-Vitamin D 200-250 MG-UNIT TABS Take 1 tablet by mouth daily after breakfast.      carbamazepine (TEGRETOL XR) 200 MG 12 hr tablet TAKE TWO TABLETS BY MOUTH TWICE A DAY 360 tablet 1   ezetimibe (ZETIA) 10 MG tablet Take 1 tablet by mouth daily.     fluticasone (FLONASE) 50 MCG/ACT nasal spray      folic acid (FOLVITE) 1 MG tablet Take 2 mg by mouth daily with breakfast.      gabapentin (NEURONTIN) 300 MG capsule TAKE 1 CAPSULE BY MOUTH TWICE DAILY 180 capsule 1   hypromellose (GENTEAL) 0.3 % GEL ophthalmic ointment Apply to eye.     ibandronate (BONIVA) 150 MG tablet every 30 (thirty) days.     levothyroxine (SYNTHROID,  LEVOTHROID) 75 MCG tablet Take 75 mcg by mouth daily before breakfast.  methotrexate 2.5 MG tablet Take 15 mg by mouth every Wednesday. WEDNESDAYS AT LUNCH- takes differently     mirabegron ER (MYRBETRIQ) 25 MG TB24 tablet Take 25 mg by mouth daily. Takes differently     Multiple Vitamins-Minerals (MULTIVITAMIN WITH MINERALS) tablet Take 1 tablet by mouth daily with breakfast.      naproxen sodium (ALEVE) 220 MG tablet Take 220 mg by mouth daily as needed.     nitroGLYCERIN (NITROSTAT) 0.4 MG SL tablet Place under the tongue.     pravastatin (PRAVACHOL) 80 MG tablet Take 1 tablet by mouth at bedtime.     Propylene Glycol 0.6 % SOLN Apply to eye. Takes 4 times a day, systane     QUEtiapine (SEROQUEL XR) 300 MG 24 hr tablet Take 1 tablet (300 mg total) by mouth at bedtime. 90 tablet 1   QUEtiapine (SEROQUEL XR) 50 MG TB24 24 hr tablet TAKE ONE TABLET BY MOUTH AT BEDTIME ALONG WITH 300 MG. TOTAL OF 350 MG 30 tablet 1   temazepam (RESTORIL) 7.5 MG capsule TAKE ONE CAPSULE BY MOUTH ONCE A DAY AT BEDTIME 30 capsule 5   divalproex (DEPAKOTE ER) 500 MG 24 hr tablet TAKE ONE TABLET BY MOUTH TWICE A DAY 180 tablet 1   tolterodine (DETROL LA) 4 MG 24 hr capsule Take 1 capsule (4 mg total) by mouth daily. (Patient not taking: Reported on 11/12/2022) 90 capsule 3   No current facility-administered medications for this visit.     Musculoskeletal: Strength & Muscle Tone: within normal limits Gait & Station:  walks with cane Patient leans: N/A  Psychiatric Specialty Exam: Review of Systems  Gastrointestinal:  Positive for abdominal pain.  Psychiatric/Behavioral:  The patient is nervous/anxious.     Blood pressure (!) 164/91, pulse 74, temperature 97.6 F (36.4 C), temperature source Skin, height 4\' 9"  (1.448 m), weight 104 lb (47.2 kg).Body mass index is 22.51 kg/m.  General Appearance: Fairly Groomed  Eye Contact:  Fair  Speech:  Clear and Coherent  Volume:  Normal  Mood:  Anxious  Affect:   Appropriate  Thought Process:  Goal Directed and Descriptions of Associations: Intact  Orientation:  Full (Time, Place, and Person)  Thought Content: Logical   Suicidal Thoughts:  No  Homicidal Thoughts:  No  Memory:  Immediate;   Fair Recent;   Fair Remote;   Fair  Judgement:  Fair  Insight:  Fair  Psychomotor Activity:  Normal  Concentration:  Concentration: Fair and Attention Span: Fair  Recall:  [employer] of Knowledge: Fair  Language: Fair  Akathisia:  No  Handed:  Right  AIMS (if indicated): done  Assets:  Communication Skills Desire for Improvement Resilience Social Support Talents/Skills  ADL's:  Intact  Cognition: WNL  Sleep:  Fair   Screenings: Midwife Visit from 11/12/2022 in [facility] Office Visit from 06/29/2022 in [facility] Office Visit from 04/29/2022 in [facility] Office Visit from 01/26/2022 in [facility] Office Visit from 11/26/2021 in [facility]  AIMS Total Score 0 0 1 0 0      AUDIT    Flowsheet Row Admission (Discharged) from 01/02/2020 in [facility]  Alcohol Use Disorder Identification Test Final Score (AUDIT) 0      GAD-7    Flowsheet Row Office Visit from 04/29/2022 in [facility] Office Visit from 01/26/2022 in [facility] Office Visit from 11/26/2021 in [facility] Office Visit from 07/17/2020 in [facility]  Total GAD-7 Score 7 4 9 5       PHQ2-9    Flowsheet Row Office Visit from 06/29/2022 in [facility] Office Visit from 04/29/2022 in [facility] Office  Visit from 01/26/2022 in [facility] Office Visit from 11/26/2021 in [facility] Office Visit from 08/26/2021 in [facility]  PHQ-2 Total Score 0 3 0 2 0  PHQ-9 Total Score 5 9 -- 7 5      Flowsheet Row Office Visit from 11/12/2022 in [facility] ED from 09/27/2022 in [facility] Department at [facility] Visit from 06/29/2022 in [facility]  C-SSRS RISK CATEGORY No Risk No Risk No Risk        Assessment and Plan: ILISSA BONNIN is a 77 year old Caucasian female, divorced, lives in [place], has a history of bipolar disorder, multiple medical problems was evaluated in office today.  Patient with current abdominal pain, ongoing since the past several months, does report anxiety due to the same otherwise denies any significant mania, depression or sleep problems.  Plan as noted below.  Plan Bipolar disorder in remission Seroquel extended release 350 mg p.o. nightly Tegretol extended release 400 mg p.o. twice daily Depakote extended release 500 mg p.o. twice daily Gabapentin 300 mg p.o. twice daily Depakote level-10/16/2021-90-therapeutic Carbamazepine level-7.4-therapeutic  GAD-stable Gabapentin 300 mg p.o. twice daily Seroquel is prescribed Continue CBT with Ms. Felecia Jan Will continue to coordinate care.  Primary insomnia-stable Temazepam 7.5 mg p.o. nightly as needed Reviewed [place] [facility] [place] PMP AWARxE  High risk medication use-will order carbamazepine level, Depakote level, platelet level. I have reviewed LFTs-10/08/2022-within normal limits Reviewed sodium-10/08/2022-141-within normal limits.  Tardive dyskinesia-chronic-currently reports her movements around her mouth-tongue does not bother her.  Patient is not interested in medication trials for the same.  Will continue to  monitor.  Patient with history of memory problems-currently has good support system at [facility] care , SLUMS -24 out of 30-4/22/2024.  Will continue to monitor.    Collaboration of Care: Collaboration of Care: Other patient and Delorise Shiner to follow up with primary care provider as well as a specialist for current abdominal pain.  I have reviewed notes per emergency department physician-09/27/2022 as noted above.  Patient/Guardian was advised Release of Information must be obtained prior to any record release in order to collaborate their care with an outside provider. Patient/Guardian was advised if they have not already done so to contact the registration department to sign all necessary forms in order for [place] to release information regarding their care.   Consent: Patient/Guardian gives verbal consent for treatment and assignment of benefits for services provided during this visit. Patient/Guardian expressed understanding and agreed to proceed.  This note was generated in part or whole with voice recognition software. Voice recognition is usually quite accurate but there are transcription errors that can and very often do occur. I apologize for any typographical errors that were not detected and corrected.     Jomarie Longs, MD 11/12/2022, 4:33 PM

## 2022-11-18 ENCOUNTER — Telehealth: Payer: Self-pay | Admitting: Psychiatry

## 2022-11-18 LAB — VALPROIC ACID LEVEL: Valproic Acid Lvl: 67 ug/mL (ref 50–100)

## 2022-11-18 LAB — PLATELET COUNT: Platelets: 125 10*3/uL — ABNORMAL LOW (ref 150–450)

## 2022-11-18 LAB — CARBAMAZEPINE LEVEL, TOTAL: Carbamazepine (Tegretol), S: 7.8 ug/mL (ref 4.0–12.0)

## 2022-11-18 NOTE — Telephone Encounter (Signed)
I have reviewed resultant labs-carbamazepine level-7.8-within normal range Depakote level-67-therapeutic.   Platelet count-low at 125.  Patient does have a history of low platelet counts-chronic.  However this is trending low since her last platelet count in July.  Patient needs to contact primary care provider for further management.

## 2022-11-19 NOTE — Telephone Encounter (Signed)
Left message to call office back

## 2022-11-19 NOTE — Telephone Encounter (Signed)
pt returned call. pt was given results and told to contact pcp to treat.

## 2022-11-20 ENCOUNTER — Telehealth: Payer: Self-pay

## 2022-11-20 NOTE — Telephone Encounter (Signed)
pt called states that she let her pcp know and he states that her platel count usually runs low and he is not concerned.

## 2022-11-20 NOTE — Telephone Encounter (Signed)
Thank you and noted

## 2022-12-08 NOTE — Progress Notes (Signed)
12/13/22 8:38 PM   Stephanie [name] 11-Aug-1945 [id number]  Referring provider:  Kandyce Rud, MD [phone] [address] [facility] [place],  [place] [address]  Urological history  1. OAB -contributing factors of age, vaginal atrophy and Seroquel -Myrbetriq 50 mg daily - cost prohibitive   2. Urge incontinence -contributing factors of age, vaginal atrophy and Seroquel   3. Urethral caruncle -applying the vaginal estrogen cream three nights weekly   4. rUTI's -contributing factors of age, vaginal atrophy, incontinence, immunocompromised, family history (mother has a history of rUTI's) and IBS -contrast CT (09/2022) - NED  -documented urine cultures over the last year  May 01, 2022-less than 10,000 colonies   Chief Complaint  Patient presents with   Follow-up    HPI: Stephanie [name] is a 77 y.o.female who presents today for a 6-week follow-up after changing from Myrbetriq to Detrol LA 4 mg daily  Previous records reviewed.   She is having 8 or more daytime voids, no nocturia with a mild urge to urinate.  She is always wearing absorbent products for leakage.  She did not indicate any bothersome urinary leakage on questionnaire.  She does not limit fluid intake.  She does engage in toilet mapping.  Patient denies any modifying or aggravating factors.  Patient denies any recent UTI's, gross hematuria, dysuria or suprapubic/flank pain.  Patient denies any fevers, chills, nausea or vomiting.    UA bacteriuria-likely chronic  PVR demonstrates adequate emptying  She is at goal on the Detrol LA 4 mg daily.  She is having mild side effects of dry mouth and dry eye.  PMH: Past Medical History:  Diagnosis Date   Anginal pain (HCC)    Anxiety    Bipolar 1 disorder (HCC)    Bipolar disorder (HCC)    Coronary artery disease    Depression    Fatigue    Fibromyalgia    GERD (gastroesophageal reflux disease)    Heart disease    Heart failure  (HCC)    HLD (hyperlipidemia)    HOH (hard of hearing)    Hypothyroidism    IBS (irritable bowel syndrome)    Pelvic pain in female    Perianal lesion    high grade sil lesion- keratinizing type   RA (rheumatoid arthritis) (HCC)    Raynaud phenomenon    Sjogren's disease (HCC)    Sleep apnea    Vaginal Pap smear, abnormal     Surgical History: Past Surgical History:  Procedure Laterality Date   CATARACT EXTRACTION W/PHACO Left 02/23/2017   Procedure: CATARACT EXTRACTION PHACO AND INTRAOCULAR LENS PLACEMENT (IOC);  Surgeon: Galen Manila, MD;  Location: [facility] ORS;  Service: Ophthalmology;  Laterality: Left;  [place] 00:35 AP% 9.6 CDE 3.47 Fluid pack lot # [id number]   CATARACT EXTRACTION W/PHACO Right 03/30/2017   Procedure: CATARACT EXTRACTION PHACO AND INTRAOCULAR LENS PLACEMENT (IOC);  Surgeon: Galen Manila, MD;  Location: [facility] OR[facility];  Service: Ophthalmology;  Laterality: Right;  [place] 00:32.6 AP% 13.8 CDE 4.51 Fluid Pack Lot # [id number]   EYE SURGERY     HYSTEROSCOPY WITH D & C N/A 12/22/2021   Procedure: DILATATION AND CURETTAGE /HYSTEROSCOPY;  Surgeon: Hildred Laser, MD;  Location: [facility] ORS;  Service: Gynecology;  Laterality: N/A;   TONSILLECTOMY      Home Medications:  Allergies as of 12/09/2022       Reactions   Amoxicillin-pot Clavulanate Nausea Only, Other (See Comments)   Diarrhea Has patient had a  PCN reaction causing immediate rash, facial/tongue/throat swelling, SOB or lightheadedness with hypotension:  Has patient had a PCN reaction causing severe rash involving mucus membranes or skin necrosis: Has patient had a PCN reaction that required hospitalization:  Has patient had a PCN reaction occurring within the last 10 years: If all of the above answers are "NO", then may proceed with Cephalosporin use.   Oxycodone-acetaminophen Nausea Only   Erythromycin Nausea Only   Azithromycin Nausea And Vomiting   Codeine    Macrolides And Ketolides     Oxycodone-acetaminophen    Penicillins Other (See Comments)   Has patient had a PCN reaction causing immediate rash, facial/tongue/throat swelling, SOB or lightheadedness with hypotension: NO Has patient had a PCN reaction causing severe rash involving mucus membranes or skin necrosis: NO Has patient had a PCN reaction that required hospitalization: NO Has patient had a PCN reaction occurring within the last 10 years: NO If all of the above answers are "NO", then may proceed with Cephalosporin use.   Remeron [mirtazapine]    Side effect-likely tardive dyskinesia   Tramadol Nausea And Vomiting   Zolpidem    Sleep walking   Lamotrigine Rash   LAMICTAL   Latex Rash   Latex bandages   Sulfa Antibiotics Rash        Medication List        Accurate as of December 09, 2022 11:59 PM. If you have any questions, ask your nurse or doctor.          STOP taking these medications    mirabegron ER 25 MG Tb24 tablet Commonly known as: MYRBETRIQ Stopped by: Michiel Cowboy       TAKE these medications    Acetaminophen 500 MG capsule Take 1,000 mg by mouth in the morning, at noon, and at bedtime. tid   aspirin 81 MG chewable tablet Chew 81 mg by mouth daily with breakfast.   Calcium Citrate-Vitamin D 200-250 MG-UNIT Tabs Take 1 tablet by mouth daily after breakfast.   carbamazepine 200 MG 12 hr tablet Commonly known as: TEGRETOL XR TAKE TWO TABLETS BY MOUTH TWICE A DAY   divalproex 500 MG 24 hr tablet Commonly known as: DEPAKOTE ER TAKE ONE TABLET BY MOUTH TWICE A DAY   ezetimibe 10 MG tablet Commonly known as: ZETIA Take 1 tablet by mouth daily.   fluticasone 50 MCG/ACT nasal spray Commonly known as: FLONASE   folic acid 1 MG tablet Commonly known as: FOLVITE Take 2 mg by mouth daily with breakfast.   gabapentin 300 MG capsule Commonly known as: NEURONTIN TAKE 1 CAPSULE BY MOUTH TWICE DAILY   hypromellose 0.3 % Gel ophthalmic ointment Commonly known as:  GENTEAL Apply to eye.   ibandronate 150 MG tablet Commonly known as: BONIVA every 30 (thirty) days.   levothyroxine 75 MCG tablet Commonly known as: SYNTHROID Take 75 mcg by mouth daily before breakfast.   methotrexate 2.5 MG tablet Commonly known as: RHEUMATREX Take 15 mg by mouth every Wednesday. WEDNESDAYS AT LUNCH- takes differently   multivitamin with minerals tablet Take 1 tablet by mouth daily with breakfast.   naproxen sodium 220 MG tablet Commonly known as: ALEVE Take 220 mg by mouth daily as needed.   nitroGLYCERIN 0.4 MG SL tablet Commonly known as: NITROSTAT Place under the tongue.   pravastatin 80 MG tablet Commonly known as: PRAVACHOL Take 1 tablet by mouth at bedtime.   Propylene Glycol 0.6 % Soln Apply to eye. Takes 4 times a day, systane  QUEtiapine 300 MG 24 hr tablet Commonly known as: SEROquel XR Take 1 tablet (300 mg total) by mouth at bedtime.   QUEtiapine 50 MG Tb24 24 hr tablet Commonly known as: SEROQUEL XR TAKE ONE TABLET BY MOUTH AT BEDTIME ALONG WITH 300 MG. TOTAL OF 350 MG   temazepam 7.5 MG capsule Commonly known as: RESTORIL TAKE ONE CAPSULE BY MOUTH ONCE A DAY AT BEDTIME   tolterodine 4 MG 24 hr capsule Commonly known as: DETROL LA Take 1 capsule (4 mg total) by mouth daily.        Allergies:  Allergies  Allergen Reactions   Amoxicillin-Pot Clavulanate Nausea Only and Other (See Comments)    Diarrhea Has patient had a PCN reaction causing immediate rash, facial/tongue/throat swelling, SOB or lightheadedness with hypotension:  Has patient had a PCN reaction causing severe rash involving mucus membranes or skin necrosis: Has patient had a PCN reaction that required hospitalization:  Has patient had a PCN reaction occurring within the last 10 years: If all of the above answers are "NO", then may proceed with Cephalosporin use.    Oxycodone-Acetaminophen Nausea Only   Erythromycin Nausea Only   Azithromycin Nausea And  Vomiting   Codeine    Macrolides And Ketolides    Oxycodone-Acetaminophen    Penicillins Other (See Comments)    Has patient had a PCN reaction causing immediate rash, facial/tongue/throat swelling, SOB or lightheadedness with hypotension: NO Has patient had a PCN reaction causing severe rash involving mucus membranes or skin necrosis: NO Has patient had a PCN reaction that required hospitalization: NO Has patient had a PCN reaction occurring within the last 10 years: NO If all of the above answers are "NO", then may proceed with Cephalosporin use.    Remeron [Mirtazapine]     Side effect-likely tardive dyskinesia   Tramadol Nausea And Vomiting   Zolpidem     Sleep walking   Lamotrigine Rash    LAMICTAL   Latex Rash    Latex bandages   Sulfa Antibiotics Rash    Family History: Family History  Problem Relation Age of Onset   Heart failure Father    Parkinson's disease Mother    Bipolar disorder Mother    Depression Sister    Schizophrenia Paternal Grandmother    Diabetes Maternal Grandmother    Pancreatic cancer Maternal Grandmother    Cancer Neg Hx    Heart disease Neg Hx    Breast cancer Neg Hx     Social History:  reports that she has never smoked. She has quit using smokeless tobacco. She reports that she does not drink alcohol and does not use drugs.   Physical Exam: BP (!) 159/80   Pulse 76   Constitutional:  Well nourished. Alert and oriented, No acute distress. HEENT: [place] AT, moist mucus membranes.  Trachea midline, no masses. Cardiovascular: No clubbing, cyanosis, or edema. Respiratory: Normal respiratory effort, no increased work of breathing. Neurologic: Grossly intact, no focal deficits, moving all 4 extremities. Psychiatric: Normal mood and affect.    Laboratory data: ontains abnormal data Comprehensive Metabolic Panel (CMP) Order: [id number] Component Ref Range & Units 2 mo ago  Glucose 70 - 110 mg/dL 86  Sodium 132 - 440 mmol/L 141   Potassium 3.6 - 5.1 mmol/L 4.8  Chloride 97 - 109 mmol/L 104  Carbon Dioxide (CO2) 22.0 - 32.0 mmol/L 31.9  Urea Nitrogen (BUN) 7 - 25 mg/dL 15  Creatinine 0.6 - 1.1 mg/dL 0.6  Glomerular Filtration Rate (eGFR) >60  mL/min/1.73sq m 92  Comment: CKD-EPI (2021) does not include patient's race in the calculation of eGFR.  Monitoring changes of plasma creatinine and eGFR over time is useful for monitoring kidney function.  Interpretive Ranges for eGFR (CKD-EPI 2021):  eGFR:       >60 mL/min/1.73 sq. m - Normal eGFR:       30-59 mL/min/1.73 sq. m - Moderately Decreased eGFR:       15-29 mL/min/1.73 sq. m  - Severely Decreased eGFR:       < 15 mL/min/1.73 sq. m  - Kidney Failure   Note: These eGFR calculations do not apply in acute situations when eGFR is changing rapidly or patients on dialysis.  Calcium 8.7 - 10.3 mg/dL 9.3  AST 8 - 39 U/L 24  ALT 5 - 38 U/L 13  Alk Phos (alkaline Phosphatase) 34 - 104 U/L 49  Albumin 3.5 - 4.8 g/dL 4.0  Bilirubin, Total 0.3 - 1.2 mg/dL 0.4  Protein, Total 6.1 - 7.9 g/dL 5.8 Low   A/G Ratio 1.0 - 5.0 gm/dL 2.2  Resulting Agency [facility]   Specimen Collected: 10/08/22 11:06   Performed by: [facility] Last Resulted: 10/08/22 16:36  Received From: [facility]  Result Received: 10/27/22 09:04   Urinalysis See Epic and HPI I have reviewed the labs.   Pertinent Imaging: CLINICAL DATA:  Right lower quadrant abdominal pain.   EXAM: CT ABDOMEN AND PELVIS WITH CONTRAST   TECHNIQUE: Multidetector CT imaging of the abdomen and pelvis was performed using the standard protocol following bolus administration of intravenous contrast.   RADIATION DOSE REDUCTION: This exam was performed according to the departmental dose-optimization program which includes automated exposure control, adjustment of the mA and/or kV according to patient size and/or use of iterative reconstruction technique.    CONTRAST:  75mL OMNIPAQUE IOHEXOL 300 MG/ML  SOLN   COMPARISON:  CT abdomen and pelvis dated 04/24/2005.   FINDINGS: Lower chest: No acute abnormality.   Hepatobiliary: No focal liver abnormality is seen. No gallstones, gallbladder wall thickening, or biliary dilatation.   Pancreas: Unremarkable. No pancreatic ductal dilatation or surrounding inflammatory changes.   Spleen: Normal in size without focal abnormality.   Adrenals/Urinary Tract: Adrenal glands are unremarkable. Kidneys are normal, without renal calculi, focal lesion, or hydronephrosis. Bladder is unremarkable.   Stomach/Bowel: Stomach is within normal limits. Appendix appears normal. No evidence of bowel wall thickening, distention, or inflammatory changes.   Vascular/Lymphatic: Aortic atherosclerosis. No enlarged abdominal or pelvic lymph nodes.   Reproductive: Uterus and bilateral adnexa are unremarkable.   Other: No abdominal wall hernia or abnormality. No abdominopelvic ascites.   Musculoskeletal: Degenerative changes are seen in the spine.   IMPRESSION: 1. No acute findings in the abdomen or pelvis.   Aortic Atherosclerosis (ICD10-I70.0).     Electronically Signed   By: Romona Curls M.D.   On: 09/27/2022 12:59   12/09/22 11:30  Scan Result 38 ml   I have independently reviewed the films.  See HPI.    Assessment & Plan:    1. rUTI's - asymptomatic at today's visit -UA with bacteriuria secondary to colonization -Urine will not be sent for culture as she is not having UTI symptoms  2. Incontinence  -She is at goal with the Detrol LA 4 mg daily -Continue Detrol LA 4 mg daily  3. High risk hematuria -She was having issues with spotting in her absorbent underwear last year, she was offered  a hematuria workup but declined -She denied any further spotting in her underwear        Return in about 1 year (around 12/09/2023) for PVR and OAB questionnaire.   Michiel Cowboy, PA-C   [facility] [address] [place], [place] [address] [phone]

## 2022-12-09 ENCOUNTER — Ambulatory Visit: Payer: Medicare PPO | Admitting: Urology

## 2022-12-09 ENCOUNTER — Encounter: Payer: Self-pay | Admitting: Urology

## 2022-12-09 VITALS — BP 159/80 | HR 76

## 2022-12-09 DIAGNOSIS — N390 Urinary tract infection, site not specified: Secondary | ICD-10-CM

## 2022-12-09 DIAGNOSIS — N952 Postmenopausal atrophic vaginitis: Secondary | ICD-10-CM

## 2022-12-09 DIAGNOSIS — Z87440 Personal history of urinary (tract) infections: Secondary | ICD-10-CM

## 2022-12-09 DIAGNOSIS — N3941 Urge incontinence: Secondary | ICD-10-CM | POA: Diagnosis not present

## 2022-12-09 DIAGNOSIS — R319 Hematuria, unspecified: Secondary | ICD-10-CM

## 2022-12-09 LAB — BLADDER SCAN AMB NON-IMAGING: Scan Result: 38

## 2022-12-09 MED ORDER — TOLTERODINE TARTRATE ER 4 MG PO CP24
4.0000 mg | ORAL_CAPSULE | Freq: Every day | ORAL | 3 refills | Status: DC
Start: 2022-12-09 — End: 2023-12-08

## 2022-12-10 LAB — URINALYSIS, COMPLETE
Bilirubin, UA: NEGATIVE
Glucose, UA: NEGATIVE
Ketones, UA: NEGATIVE
Nitrite, UA: NEGATIVE
Protein,UA: NEGATIVE
RBC, UA: NEGATIVE
Specific Gravity, UA: 1.015 (ref 1.005–1.030)
Urobilinogen, Ur: 0.2 mg/dL (ref 0.2–1.0)
pH, UA: 7 (ref 5.0–7.5)

## 2022-12-10 LAB — MICROSCOPIC EXAMINATION

## 2022-12-18 ENCOUNTER — Other Ambulatory Visit: Payer: Self-pay | Admitting: Psychiatry

## 2022-12-18 DIAGNOSIS — Z79899 Other long term (current) drug therapy: Secondary | ICD-10-CM

## 2022-12-18 DIAGNOSIS — F5101 Primary insomnia: Secondary | ICD-10-CM

## 2022-12-18 DIAGNOSIS — F411 Generalized anxiety disorder: Secondary | ICD-10-CM

## 2022-12-21 ENCOUNTER — Other Ambulatory Visit: Payer: Self-pay | Admitting: Psychiatry

## 2022-12-21 DIAGNOSIS — Z79899 Other long term (current) drug therapy: Secondary | ICD-10-CM

## 2022-12-22 ENCOUNTER — Other Ambulatory Visit: Payer: Self-pay | Admitting: Psychiatry

## 2022-12-22 DIAGNOSIS — F5101 Primary insomnia: Secondary | ICD-10-CM

## 2022-12-31 DIAGNOSIS — N390 Urinary tract infection, site not specified: Secondary | ICD-10-CM | POA: Insufficient documentation

## 2022-12-31 DIAGNOSIS — N952 Postmenopausal atrophic vaginitis: Secondary | ICD-10-CM | POA: Insufficient documentation

## 2022-12-31 DIAGNOSIS — N3281 Overactive bladder: Secondary | ICD-10-CM | POA: Insufficient documentation

## 2023-01-20 ENCOUNTER — Telehealth: Payer: Self-pay

## 2023-01-20 DIAGNOSIS — R413 Other amnesia: Secondary | ICD-10-CM

## 2023-01-20 DIAGNOSIS — F3174 Bipolar disorder, in full remission, most recent episode manic: Secondary | ICD-10-CM

## 2023-01-20 MED ORDER — CARBAMAZEPINE ER 200 MG PO TB12
400.0000 mg | ORAL_TABLET | Freq: Two times a day (BID) | ORAL | 3 refills | Status: DC
Start: 2023-01-20 — End: 2023-11-24

## 2023-01-20 MED ORDER — GABAPENTIN 300 MG PO CAPS: ORAL_CAPSULE | ORAL | 3 refills | Status: DC

## 2023-01-20 NOTE — Telephone Encounter (Signed)
I have sent gabapentin and carbamazepine extended release to pharmacy, [place].

## 2023-01-20 NOTE — Telephone Encounter (Signed)
Pt.notified

## 2023-01-20 NOTE — Telephone Encounter (Signed)
pt needs refill on the gabapentin so they can fix her bubble pack she out of medication today.

## 2023-01-20 NOTE — Telephone Encounter (Signed)
pharmacy notified and they are going to get it ready .

## 2023-01-20 NOTE — Telephone Encounter (Signed)
pt called states that she also need tegretor xr sent into pharmacy

## 2023-02-11 ENCOUNTER — Encounter: Payer: Self-pay | Admitting: Psychiatry

## 2023-02-11 ENCOUNTER — Ambulatory Visit: Payer: Medicare PPO | Admitting: Psychiatry

## 2023-02-11 VITALS — BP 118/78 | HR 69 | Temp 97.9°F | Ht <= 58 in | Wt 106.0 lb

## 2023-02-11 DIAGNOSIS — F3174 Bipolar disorder, in full remission, most recent episode manic: Secondary | ICD-10-CM

## 2023-02-11 DIAGNOSIS — F411 Generalized anxiety disorder: Secondary | ICD-10-CM

## 2023-02-11 DIAGNOSIS — G2401 Drug induced subacute dyskinesia: Secondary | ICD-10-CM

## 2023-02-11 DIAGNOSIS — F5101 Primary insomnia: Secondary | ICD-10-CM

## 2023-02-11 DIAGNOSIS — Z79899 Other long term (current) drug therapy: Secondary | ICD-10-CM

## 2023-02-11 NOTE — Progress Notes (Signed)
BH MD OP Progress Note  02/11/2023 3:00 PM Stephanie [name]  MRN:  [id number]  Chief Complaint:  Chief Complaint  Patient presents with   Follow-up   Depression   Anxiety   Insomnia   Medication Refill   HPI: Stephanie [name] is a 77 year old Caucasian female, divorced, lives in [place], has a history of bipolar disorder, GAD, primary insomnia, memory loss, GAD, Raynaud's phenomenon, Sjogren's syndrome, hypothyroidism, coronary artery disease, IBS, hyperlipidemia, fibromyalgia, GERD was evaluated in office today.  The patient, with a history of bipolar disorder, rheumatoid arthritis, and Sjogren's syndrome, presents with concerns about recent falls at home. She suspects these falls may have triggered her sciatica, a new symptom characterized by nerve pain. The patient has a history of multiple falls, which she attributes to instability of her walker, which has different sized wheels at the front and back. She has been using a walker due to previous falls and has had physical therapy in the past for fall-related injuries.  The patient also reports a recent episode of urinary tract infection (UTI), which she has experienced frequently. She has been advised to take a supplement, to help reduce the frequency of UTIs.  The patient is on multiple medications, including Seroquel, Depakote, carbamazepine, gabapentin, as well as multiple others.  She expresses concern about the potential for her medications to contribute to her falls, particularly as she ages.  Despite these health challenges, the patient reports that her mood has been stable on her current medications. She has a strong faith and uses scripture recitation as a form of relaxation and sleep aid. She also engages in word games and puzzles for cognitive stimulation and has a supportive network of caregivers who assist with daily tasks and medical appointments.  The patient's sleep pattern is somewhat irregular, often going to sleep after 11:30  pm and waking up at 6:00 am. She expresses a preference for being able to sleep well, which she achieves with the help of her medications.  The patient has been proactive in managing her health, seeking advice from her healthcare providers, researching her conditions, and implementing recommended changes, such as using ice packs for injuries and considering modifications to her walker. She expresses gratitude for the support she receives from her caregivers and healthcare team.  Patient currently denies any suicidality, homicidality or perceptual disturbances.  Patient appeared to be alert, oriented to person place time situation.  3 word memory immediate 3 out of 3, after 5 minutes 3 out of 3.  Patient was able to spell 'WORLD' forward and backward.  Attention and focus seem to be good.   Visit Diagnosis:    ICD-10-CM   1. Bipolar disorder, in full remission, most recent episode manic (HCC)  F31.74     2. GAD (generalized anxiety disorder)  F41.1     3. Primary insomnia  F51.01     4. High risk medication use  Z79.899     5. Tardive dyskinesia  G24.01       Past Psychiatric History: I have reviewed past psychiatric history from progress note on 06/17/2017.  Inpatient mental health admission at [facility]-01/02/2020 - 01/11/2020.  Past Medical History:  Past Medical History:  Diagnosis Date   Anginal pain (HCC)    Anxiety    Bipolar 1 disorder (HCC)    Bipolar disorder (HCC)    Coronary artery disease    Depression    Fatigue    Fibromyalgia    GERD (gastroesophageal reflux  disease)    Heart disease    Heart failure (HCC)    HLD (hyperlipidemia)    HOH (hard of hearing)    Hypothyroidism    IBS (irritable bowel syndrome)    Pelvic pain in female    Perianal lesion    high grade sil lesion- keratinizing type   RA (rheumatoid arthritis) (HCC)    Raynaud phenomenon    Sjogren's disease (HCC)    Sleep apnea    Vaginal Pap smear, abnormal     Past  Surgical History:  Procedure Laterality Date   CATARACT EXTRACTION W/PHACO Left 02/23/2017   Procedure: CATARACT EXTRACTION PHACO AND INTRAOCULAR LENS PLACEMENT (IOC);  Surgeon: Galen Manila, MD;  Location: [facility] ORS;  Service: Ophthalmology;  Laterality: Left;  [place] 00:35 AP% 9.6 CDE 3.47 Fluid pack lot # [id number]   CATARACT EXTRACTION W/PHACO Right 03/30/2017   Procedure: CATARACT EXTRACTION PHACO AND INTRAOCULAR LENS PLACEMENT (IOC);  Surgeon: Galen Manila, MD;  Location: [facility] ORS;  Service: Ophthalmology;  Laterality: Right;  [place] 00:32.6 AP% 13.8 CDE 4.51 Fluid Pack Lot # [id number]   EYE SURGERY     HYSTEROSCOPY WITH D & C N/A 12/22/2021   Procedure: DILATATION AND CURETTAGE /HYSTEROSCOPY;  Surgeon: Hildred Laser, MD;  Location: [facility] ORS;  Service: Gynecology;  Laterality: N/A;   TONSILLECTOMY      Family Psychiatric History: I have reviewed family psychiatric history from progress note on 06/17/2017.  Family History:  Family History  Problem Relation Age of Onset   Heart failure Father    Parkinson's disease Mother    Bipolar disorder Mother    Depression Sister    Schizophrenia Paternal Grandmother    Diabetes Maternal Grandmother    Pancreatic cancer Maternal Grandmother    Cancer Neg Hx    Heart disease Neg Hx    Breast cancer Neg Hx     Social History: I have reviewed social history from progress note on 06/17/2017. Social History   Socioeconomic History   Marital status: Divorced    Spouse name: Not on file   Number of children: 2   Years of education: Not on file   Highest education level: Master's degree (e.g., MA, MS, MEng, MEd, MSW, MBA)  Occupational History    Comment: retired2  Tobacco Use   Smoking status: Never   Smokeless tobacco: Former  Building services engineer status: Never Used  Substance and Sexual Activity   Alcohol use: No    Alcohol/week: 0.0 standard drinks of alcohol   Drug use: No   Sexual activity: Never    Birth control/protection:  Post-menopausal  Other Topics Concern   Not on file  Social History Narrative   Lives alone   Social Determinants of Health   Financial Resource Strain: Patient Declined (02/02/2023)   Received from [facility]   Overall Financial Resource Strain (CARDIA)    Difficulty of Paying Living Expenses: Patient declined  Food Insecurity: Patient Declined (02/02/2023)   Received from [facility]   Hunger Vital Sign    Worried About Running Out of Food in the Last Year: Patient declined    Ran Out of Food in the Last Year: Patient declined  Transportation Needs: Patient Declined (02/02/2023)   Received from [facility] - Transportation    In the past 12 months, has lack of transportation kept you from medical appointments or from getting medications?: Patient declined    Lack of Transportation (Non-Medical):  Patient declined  Physical Activity: Inactive (01/26/2017)   Exercise Vital Sign    Days of Exercise per Week: 0 days    Minutes of Exercise per Session: 0 min  Stress: No Stress Concern Present (01/26/2017)   [employer] of Occupational Health - Occupational Stress Questionnaire    Feeling of Stress : Only a little  Social Connections: Unknown (01/26/2017)   Social Connection and Isolation Panel [NHANES]    Frequency of Communication with Friends and Family: Not on file    Frequency of Social Gatherings with Friends and Family: Not on file    Attends Religious Services: Never    Database administrator or Organizations: No    Attends Banker Meetings: Never    Marital Status: Divorced    Allergies:  Allergies  Allergen Reactions   Amoxicillin-Pot Clavulanate Nausea Only and Other (See Comments)    Diarrhea Has patient had a PCN reaction causing immediate rash, facial/tongue/throat swelling, SOB or lightheadedness with hypotension:  Has patient had a PCN reaction causing severe rash involving  mucus membranes or skin necrosis: Has patient had a PCN reaction that required hospitalization:  Has patient had a PCN reaction occurring within the last 10 years: If all of the above answers are "NO", then may proceed with Cephalosporin use.    Oxycodone-Acetaminophen Nausea Only   Erythromycin Nausea Only   Azithromycin Nausea And Vomiting   Codeine    Macrolides And Ketolides    Oxycodone-Acetaminophen    Penicillins Other (See Comments)    Has patient had a PCN reaction causing immediate rash, facial/tongue/throat swelling, SOB or lightheadedness with hypotension: NO Has patient had a PCN reaction causing severe rash involving mucus membranes or skin necrosis: NO Has patient had a PCN reaction that required hospitalization: NO Has patient had a PCN reaction occurring within the last 10 years: NO If all of the above answers are "NO", then may proceed with Cephalosporin use.    Remeron [Mirtazapine]     Side effect-likely tardive dyskinesia   Tramadol Nausea And Vomiting   Zolpidem     Sleep walking   Lamotrigine Rash    LAMICTAL   Latex Rash    Latex bandages   Sulfa Antibiotics Rash    Metabolic Disorder Labs: Lab Results  Component Value Date   HGBA1C 5.3 11/22/2017   Lab Results  Component Value Date   PROLACTIN 33.0 (H) 11/22/2017   Lab Results  Component Value Date   CHOL 222 (H) 01/02/2020   TRIG 190 (H) 01/02/2020   HDL 69 01/02/2020   CHOLHDL 3.2 01/02/2020   VLDL 38 01/02/2020   LDLCALC 115 (H) 01/02/2020   LDLCALC 119 (H) 11/22/2017   Lab Results  Component Value Date   TSH 2.678 01/02/2020    Therapeutic Level Labs: No results found for: "LITHIUM" Lab Results  Component Value Date   VALPROATE 67 11/17/2022   VALPROATE 90 10/16/2021   Lab Results  Component Value Date   CBMZ 7.8 11/17/2022   CBMZ 7.4 10/16/2021    Current Medications: Current Outpatient Medications  Medication Sig Dispense Refill   Acetaminophen 500 MG capsule Take  1,000 mg by mouth in the morning, at noon, and at bedtime. tid     aspirin 81 MG chewable tablet Chew 81 mg by mouth daily with breakfast.     Calcium Citrate-Vitamin D 200-250 MG-UNIT TABS Take 1 tablet by mouth daily after breakfast.      carbamazepine (TEGRETOL XR) 200 MG 12  hr tablet Take 2 tablets (400 mg total) by mouth 2 (two) times daily. 360 tablet 3   divalproex (DEPAKOTE ER) 500 MG 24 hr tablet TAKE ONE TABLET BY MOUTH TWICE A DAY 180 tablet 1   fluticasone (FLONASE) 50 MCG/ACT nasal spray      folic acid (FOLVITE) 1 MG tablet Take 2 mg by mouth daily with breakfast.      gabapentin (NEURONTIN) 300 MG capsule TAKE 1 CAPSULE BY MOUTH TWICE DAILY 180 capsule 3   hypromellose (GENTEAL) 0.3 % GEL ophthalmic ointment Apply to eye.     ibandronate (BONIVA) 150 MG tablet every 30 (thirty) days.     levothyroxine (SYNTHROID, LEVOTHROID) 75 MCG tablet Take 75 mcg by mouth daily before breakfast.      methotrexate 2.5 MG tablet Take 15 mg by mouth every Wednesday. WEDNESDAYS AT LUNCH- takes differently     Multiple Vitamins-Minerals (MULTIVITAMIN WITH MINERALS) tablet Take 1 tablet by mouth daily with breakfast.      naproxen sodium (ALEVE) 220 MG tablet Take 220 mg by mouth daily as needed.     nitroGLYCERIN (NITROSTAT) 0.4 MG SL tablet Place under the tongue.     Propylene Glycol 0.6 % SOLN Apply to eye. Takes 4 times a day, systane     QUEtiapine (SEROQUEL XR) 300 MG 24 hr tablet TAKE ONE TABLET (300 MG TOTAL) BY MOUTH AT BEDTIME. 90 tablet 1   temazepam (RESTORIL) 7.5 MG capsule TAKE ONE CAPSULE BY MOUTH ONCE A DAY AT BEDTIME 30 capsule 5   tolterodine (DETROL LA) 4 MG 24 hr capsule Take 1 capsule (4 mg total) by mouth daily. 90 capsule 3   ezetimibe (ZETIA) 10 MG tablet Take 1 tablet by mouth daily.     mometasone (ELOCON) 0.1 % cream Apply topically. (Patient not taking: Reported on 02/11/2023)     pravastatin (PRAVACHOL) 80 MG tablet Take 1 tablet by mouth at bedtime.     No current  facility-administered medications for this visit.     Musculoskeletal: Strength & Muscle Tone: within normal limits Gait & Station:  Walks with walker Patient leans: N/A  Psychiatric Specialty Exam: Review of Systems  Psychiatric/Behavioral: Negative.      Blood pressure 118/78, pulse 69, temperature 97.9 F (36.6 C), temperature source Temporal, height 4\' 9"  (1.448 m), weight 106 lb (48.1 kg), SpO2 96%.Body mass index is 22.94 kg/m.  General Appearance: Fairly Groomed  Eye Contact:  Fair  Speech:  Clear and Coherent  Volume:  Normal  Mood:  Euthymic  Affect:  Appropriate  Thought Process:  Goal Directed and Descriptions of Associations: Intact  Orientation:  Full (Time, Place, and Person)  Thought Content: Logical   Suicidal Thoughts:  No  Homicidal Thoughts:  No  Memory:  Immediate;   Fair Recent;   Fair Remote;   Fair  Judgement:  Fair  Insight:  Fair  Psychomotor Activity:  Normal  Concentration:  Concentration: Fair and Attention Span: Fair  Recall:  Good  Fund of Knowledge: Fair  Language: Fair  Akathisia:  No  Handed:  Right  AIMS (if indicated): done  Assets:  Communication Skills Desire for Improvement Housing Social Support Transportation  ADL's:  Intact  Cognition: WNL  Sleep:  Fair   Screenings: Geneticist, molecular Office Visit from 11/12/2022 in [facility] Office Visit from 06/29/2022 in [facility] Office Visit from 04/29/2022 in [facility] Office  Visit from 01/26/2022 in [facility] Office Visit from 11/26/2021 in [facility]  AIMS Total Score 0 0 1 0 0      AUDIT    Flowsheet Row Admission (Discharged) from 01/02/2020 in [facility]  Alcohol Use Disorder Identification Test Final Score (AUDIT) 0      GAD-7     Flowsheet Row Office Visit from 04/29/2022 in [facility] Office Visit from 01/26/2022 in [facility] Office Visit from 11/26/2021 in [facility] Office Visit from 07/17/2020 in [facility]  Total GAD-7 Score 7 4 9 5       PHQ2-9    Flowsheet Row Office Visit from 06/29/2022 in [facility] Office Visit from 04/29/2022 in [facility] Office Visit from 01[facility]26/2022 in [facility] Office Visit from 11/26/2021 in [facility] Office Visit from 08/26/2021 in [facility]  PHQ-2 Total Score 0 3 0 2 0  PHQ-9 Total Score 5 9 -- 7 5      Flowsheet Row Office Visit from 02/11/2023 in [facility] Office Visit from 11/12/2022 in [facility] ED from 09/27/2022 in [facility] Department at [facility]  C-SSRS RISK CATEGORY No Risk No Risk No Risk        Assessment and Plan: Stephanie [name] is a 42 year old Caucasian female, divorced, [place], has a history of bipolar disorder, multiple medical problems was evaluated in office today.  Patient with recent falls, on polypharmacy, will benefit from dosage reduction of medications like Seroquel, patient agreeable to the same, plan as noted below.   Bipolar Disorder in remission Bipolar disorder with mood stabilization on Seroquel (quetiapine), Depakote, carbamazepine, and gabapentin. Reports good mood management but has experienced falls, possibly due to medication side effects. Currently taking 350 mg of Seroquel at bedtime. Discussed reducing Seroquel dose to 300 mg to mitigate fall risk. Informed  about potential risks of dizziness and drowsiness from medication combination, especially as aging increases fall risk. Prefers to continue temazepam for sleep despite potential risks. - Reduce Seroquel (quetiapine) dose from 350 mg to 300 mg at bedtime - Instruct nurse to contact the pharmacy to adjust the bubble pack to reflect the new Seroquel dosage - Continue Tegretol extended release 400 mg p.o. twice daily - Continue Depakote extended release 500 mg p.o. twice daily - Gabapentin 300 mg p.o. twice daily - Monitor for any changes in mood or increase in fall frequency after dose adjustment  Generalized anxiety disorder-stable - Continue gabapentin 300 mg p.o. twice daily. - Continue CBT with Ms. Felecia Jan.  Primary insomnia-stable - Continue temazepam 7.5 mg p.o. nightly as needed - Reviewed [place] PMP AWARxE   High risk medication use -Reviewed and discussed CBC with differential-dated 01/26/2023-platelets 137-stable, CMP including AST/ALT-within normal limits, lipid panel-within normal limits, TSH-within normal limits, valproic acid level-11/17/2022-67-therapeutic, carbamazepine  level-7.8-therapeutic   Recurrent sciatica likely triggered by recent falls. Uses a walker, which may have contributed to instability. Using ice packs for pain management and has received physical therapy in the past.  - Continue using ice packs as needed - Consider resuming physical therapy for fall prevention and sciatica management - Perform low back pain exercises as recommended by primary  care physician  Chronic rheumatoid arthritis with well-managed platelet count. Not currently on prednisone but has used it in the past for flare-ups. Discussed potential dryness in throat due to rheumatoid arthritis and Sjogren's syndrome. - Continue current management plan - Monitor for any new symptoms or flare-ups  Recurrent UTIs. Advised to use D-mannose supplements to help prevent UTIs and can take it alongside  antibiotics like ciprofloxacin. Discussed the benefits of D-mannose in reducing UTI frequency. - Consider using D-mannose supplements as recommended - Continue monitoring for UTI symptoms and use antibiotics as prescribed     Follow-up - Schedule follow-up appointment in 4-5 months - Ensure caregiver assists with YouTube Tai Chi videos for exercise - Consider engaging in word games or puzzles for cognitive stimulation.  Collaboration of Care: Collaboration of Care: Referral or follow-up with counselor/therapist AEB patient encouraged to continue CBT as well as follow-up with her providers as needed.  Collateral information obtained from caregiver who was present in session today as well.  Patient/Guardian was advised Release of Information must be obtained prior to any record release in order to collaborate their care with an outside provider. Patient/Guardian was advised if they have not already done so to contact the registration department to sign all necessary forms in order for [place] to release information regarding their care.   Consent: Patient/Guardian gives verbal consent for treatment and assignment of benefits for services provided during this visit. Patient/Guardian expressed understanding and agreed to proceed.   I have spent atleast 40 minutes face to face with patient today which includes the time spent for preparing to see the patient ( e.g., review of test, records ), obtaining and to review and separately obtained history , ordering medications and test ,psychoeducation and supportive psychotherapy and care coordination,as well as documenting clinical information in electronic health record,interpreting and communication of test results   This note was generated in part or whole with voice recognition software. Voice recognition is usually quite accurate but there are transcription errors that can and very often do occur. I apologize for any typographical errors that were not  detected and corrected.    Jomarie Longs, MD 02/12/2023, 7:56 AM

## 2023-04-01 ENCOUNTER — Ambulatory Visit: Payer: Medicare PPO | Admitting: Dermatology

## 2023-04-22 ENCOUNTER — Ambulatory Visit: Payer: Medicare PPO | Admitting: Dermatology

## 2023-04-27 ENCOUNTER — Other Ambulatory Visit: Payer: Self-pay | Admitting: Family Medicine

## 2023-04-27 DIAGNOSIS — Z1231 Encounter for screening mammogram for malignant neoplasm of breast: Secondary | ICD-10-CM

## 2023-05-14 ENCOUNTER — Telehealth: Payer: Self-pay

## 2023-05-14 ENCOUNTER — Telehealth: Payer: Self-pay | Admitting: Psychiatry

## 2023-05-14 NOTE — Telephone Encounter (Signed)
 Per front desk staff Sue Lush she left pt a voicemail message to call office back to set up an appt.

## 2023-05-14 NOTE — Telephone Encounter (Signed)
 Reached out to PT as per provider for earlier appt. PT didn't answer but a voicemail was left letting her know of an opening we had as well as being added to our wait list.

## 2023-05-14 NOTE — Telephone Encounter (Signed)
 Will recommend a sooner appointment prior to medication increase. Please place this patient on a list and schedule for sooner . Please let patient know.

## 2023-05-14 NOTE — Telephone Encounter (Signed)
 pt would like to go back on the seroquel 350mg . pt states that she notice more depression. pt next appt 4-2 and last seen 12-5

## 2023-05-27 ENCOUNTER — Ambulatory Visit (INDEPENDENT_AMBULATORY_CARE_PROVIDER_SITE_OTHER): Admitting: Psychiatry

## 2023-05-27 ENCOUNTER — Encounter: Payer: Self-pay | Admitting: Psychiatry

## 2023-05-27 VITALS — BP 122/82 | HR 82 | Temp 97.8°F | Ht <= 58 in | Wt 98.8 lb

## 2023-05-27 DIAGNOSIS — F3132 Bipolar disorder, current episode depressed, moderate: Secondary | ICD-10-CM

## 2023-05-27 DIAGNOSIS — R413 Other amnesia: Secondary | ICD-10-CM

## 2023-05-27 DIAGNOSIS — G2401 Drug induced subacute dyskinesia: Secondary | ICD-10-CM | POA: Diagnosis not present

## 2023-05-27 DIAGNOSIS — F5101 Primary insomnia: Secondary | ICD-10-CM

## 2023-05-27 DIAGNOSIS — F411 Generalized anxiety disorder: Secondary | ICD-10-CM | POA: Diagnosis not present

## 2023-05-27 MED ORDER — QUETIAPINE FUMARATE ER 50 MG PO TB24: 50.0000 mg | ORAL_TABLET | Freq: Every day | ORAL | 0 refills | Status: DC

## 2023-05-27 MED ORDER — DIVALPROEX SODIUM ER 500 MG PO TB24: ORAL_TABLET | ORAL | 1 refills | Status: DC

## 2023-05-27 NOTE — Progress Notes (Unsigned)
 BH MD OP Progress Note  05/27/2023 1:03 PM Stephanie [name]  MRN:  [id number]  Chief Complaint:  Chief Complaint  Patient presents with   Follow-up   Anxiety   Depression   Medication Refill   Insomnia   HPI: Stephanie [name] is a 78 year old Caucasian female, divorced, lives manage alone, has a history of bipolar disorder, GAD, primary insomnia, memory loss, hypothyroidism, coronary artery disease, IBS, hyperlipidemia, fibromyalgia, GERD was evaluated in office today.  Since her last visit in December, she has experienced worsening symptoms of depression likely due to seasonal changes, with increased sadness and symptoms particularly prominent during the fall and winter months. She has stopped attending church, which was a significant support system, but plans to return with the help of church members who have organized transportation for her.  She increased her dosage of Seroquel by 50 mg increments.  Had 50 mg available from a previous prescription.  Since doing so she has noticed an improvement in her mood symptoms.  She would like to stay on this dosage of 350 mg of Seroquel for now.  She continues to be compliant on her medications like gabapentin, Depakote, carbamazepine.  Denies side effects.  She has been experiencing trouble sleeping, often waking up at night to urinate. She has a history of urinary tract infections (UTIs) but is not currently experiencing one. She completed a course of antibiotics for a UTI in February.  She has temazepam available for sleep which she has been using almost every night recently.  In spite of that she has to wake up due to her need for urination.  She has lost eight pounds since December, attributing this to a lack of energy to prepare meals. She receives assistance from a caregiving agency, which helps with meals and transportation. She has been eating meals provided by [employer] and is considering nutritional supplements like Boost to help with her  weight.  She is considering moving to an assisted living facility and has visited some options with her family. She prefers a first-floor accommodation due to discomfort with elevators.  She appeared to be alert, oriented to person place time situation.  3 word memory immediate 3 out of 3, after 5 minutes 3 out of 3.  Was able to do serial sevens, attention and focus seem to be good.  Patient denied any suicidality, homicidality or perceptual disturbances.   Collateral information obtained from home instead caregiver who was present the patient.  According to caregiver patient may have been stressed out about her tax filing recently.  She does have Meals on Wheels available however she does not have a case to eat that food some days.  Agrees to assess patient with her meals and to manage her weight as well as to assist with following up with primary care provider.    Visit Diagnosis:    ICD-10-CM   1. Bipolar 1 disorder, depressed, moderate (HCC)  F31.32 QUEtiapine (SEROQUEL XR) 50 MG TB24 24 hr tablet    2. GAD (generalized anxiety disorder)  F41.1     3. Primary insomnia  F51.01     4. Tardive dyskinesia  G24.01     5. Memory loss  R41.3 divalproex (DEPAKOTE ER) 500 MG 24 hr tablet      Past Psychiatric History: I have reviewed past psychiatric history from progress note on 06/17/2017.  Inpatient [facility] health admission at [facility]-01/02/2020 - 01/11/2020.  Past Medical History:  Past Medical History:  Diagnosis Date   Anginal pain (HCC)    Anxiety    Bipolar 1 disorder (HCC)    Bipolar disorder (HCC)    Coronary artery disease    Depression    Fatigue    Fibromyalgia    GERD (gastroesophageal reflux disease)    Heart disease    Heart failure (HCC)    HLD (hyperlipidemia)    HOH (hard of hearing)    Hypothyroidism    IBS (irritable bowel syndrome)    Pelvic pain in female    Perianal lesion    high grade sil lesion- keratinizing type   RA  (rheumatoid arthritis) (HCC)    Raynaud phenomenon    Sjogren's disease (HCC)    Sleep apnea    Vaginal Pap smear, abnormal     Past Surgical History:  Procedure Laterality Date   CATARACT EXTRACTION W/PHACO Left 02/23/2017   Procedure: CATARACT EXTRACTION PHACO AND INTRAOCULAR LENS PLACEMENT (IOC);  Surgeon: Galen Manila, MD;  Location: [facility] ORS;  Service: Ophthalmology;  Laterality: Left;  [place] 00:35 AP% 9.6 CDE 3.47 Fluid pack lot # [id number]   CATARACT EXTRACTION W/PHACO Right 03/30/2017   Procedure: CATARACT EXTRACTION PHACO AND INTRAOCULAR LENS PLACEMENT (IOC);  Surgeon: Galen Manila, MD;  Location: [facility] ORS;  Service: Ophthalmology;  Laterality: Right;  [place] 00:32.6 AP% 13.8 CDE 4.51 Fluid Pack Lot # [id number]   EYE SURGERY     HYSTEROSCOPY WITH D & C N/A 12/22/2021   Procedure: DILATATION AND CURETTAGE /HYSTEROSCOPY;  Surgeon: Hildred Laser, MD;  Location: [facility] ORS;  Service: Gynecology;  Laterality: N/A;   TONSILLECTOMY      Family Psychiatric History: I have reviewed family psychiatric history from progress note on 06/17/2017.  Family History:  Family History  Problem Relation Age of Onset   Heart failure Father    Parkinson's disease Mother    Bipolar disorder Mother    Depression Sister    Schizophrenia Paternal Grandmother    Diabetes Maternal Grandmother    Pancreatic cancer Maternal Grandmother    Cancer Neg Hx    Heart disease Neg Hx    Breast cancer Neg Hx     Social History: I have reviewed social history from progress note on 06/17/2017. Social History   Socioeconomic History   Marital status: Divorced    Spouse name: Not on file   Number of children: 2   Years of education: Not on file   Highest education level: Master's degree (e.g., MA, MS, MEng, MEd, MSW, MBA)  Occupational History    Comment: retired2  Tobacco Use   Smoking status: Never   Smokeless tobacco: Former  Building services engineer status: Never Used  Substance and Sexual Activity    Alcohol use: No    Alcohol/week: 0.0 standard drinks of alcohol   Drug use: No   Sexual activity: Never    Birth control/protection: Post-menopausal  Other Topics Concern   Not on file  Social History Narrative   Lives alone   Social Drivers of Health   Financial Resource Strain: Patient Declined (02/02/2023)   Received from [facility]   Overall Financial Resource Strain (CARDIA)    Difficulty of Paying Living Expenses: Patient declined  Food Insecurity: Patient Declined (02/02/2023)   Received from [facility]   Hunger Vital Sign    Worried About Running Out of Food in the Last Year: Patient declined    Ran Out of Food in the Last Year: Patient declined  Transportation Needs: Patient Declined (02/02/2023)   Received from [facility] - Transportation    In the past 12 months, has lack of transportation kept you from medical appointments or from getting medications?: Patient declined    Lack of Transportation (Non-Medical): Patient declined  Physical Activity: Inactive (01/26/2017)   Exercise Vital Sign    Days of Exercise per Week: 0 days    Minutes of Exercise per Session: 0 min  Stress: No Stress Concern Present (01/26/2017)   [employer] of Occupational Health - Occupational Stress Questionnaire    Feeling of Stress : Only a little  Social Connections: Unknown (01/26/2017)   Social Connection and Isolation Panel [NHANES]    Frequency of Communication with Friends and Family: Not on file    Frequency of Social Gatherings with Friends and Family: Not on file    Attends Religious Services: Never    Database administrator or Organizations: No    Attends Banker Meetings: Never    Marital Status: Divorced    Allergies:  Allergies  Allergen Reactions   Amoxicillin-Pot Clavulanate Nausea Only and Other (See Comments)    Diarrhea Has patient had a PCN reaction causing immediate rash,  facial/tongue/throat swelling, SOB or lightheadedness with hypotension:  Has patient had a PCN reaction causing severe rash involving mucus membranes or skin necrosis: Has patient had a PCN reaction that required hospitalization:  Has patient had a PCN reaction occurring within the last 10 years: If all of the above answers are "NO", then may proceed with Cephalosporin use.    Oxycodone-Acetaminophen Nausea Only   Erythromycin Nausea Only   Azithromycin Nausea And Vomiting   Codeine    Macrolides And Ketolides    Oxycodone-Acetaminophen    Penicillins Other (See Comments)    Has patient had a PCN reaction causing immediate rash, facial/tongue/throat swelling, SOB or lightheadedness with hypotension: NO Has patient had a PCN reaction causing severe rash involving mucus membranes or skin necrosis: NO Has patient had a PCN reaction that required hospitalization: NO Has patient had a PCN reaction occurring within the last 10 years: NO If all of the above answers are "NO", then may proceed with Cephalosporin use.    Remeron [Mirtazapine]     Side effect-likely tardive dyskinesia   Tramadol Nausea And Vomiting   Zolpidem     Sleep walking   Lamotrigine Rash    LAMICTAL   Latex Rash    Latex bandages   Sulfa Antibiotics Rash    Metabolic Disorder Labs: Lab Results  Component Value Date   HGBA1C 5.3 11/22/2017   Lab Results  Component Value Date   PROLACTIN 33.0 (H) 11/22/2017   Lab Results  Component Value Date   CHOL 222 (H) 01/02/2020   TRIG 190 (H) 01/02/2020   HDL 69 01/02/2020   CHOLHDL 3.2 01/02/2020   VLDL 38 01/02/2020   LDLCALC 115 (H) 01/02/2020   LDLCALC 119 (H) 11/22/2017   Lab Results  Component Value Date   TSH 2.678 01/02/2020    Therapeutic Level Labs: No results found for: "LITHIUM" Lab Results  Component Value Date   VALPROATE 67 11/17/2022   VALPROATE 90 10/16/2021   Lab Results  Component Value Date   CBMZ 7.8 11/17/2022   CBMZ 7.4  10/16/2021    Current Medications: Current Outpatient Medications  Medication Sig Dispense Refill   Acetaminophen 500 MG capsule Take 1,000 mg by mouth in the morning, at noon, and at  bedtime. tid     aspirin 81 MG chewable tablet Chew 81 mg by mouth daily with breakfast.     Calcium Citrate-Vitamin D 200-250 MG-UNIT TABS Take 1 tablet by mouth daily after breakfast.      carbamazepine (TEGRETOL XR) 200 MG 12 hr tablet Take 2 tablets (400 mg total) by mouth 2 (two) times daily. 360 tablet 3   fluticasone (FLONASE) 50 MCG/ACT nasal spray      folic acid (FOLVITE) 1 MG tablet Take 2 mg by mouth daily with breakfast.      gabapentin (NEURONTIN) 300 MG capsule TAKE 1 CAPSULE BY MOUTH TWICE DAILY 180 capsule 3   hypromellose (GENTEAL) 0.3 % GEL ophthalmic ointment Apply to eye.     ibandronate (BONIVA) 150 MG tablet every 30 (thirty) days.     levothyroxine (SYNTHROID, LEVOTHROID) 75 MCG tablet Take 75 mcg by mouth daily before breakfast.      methotrexate 2.5 MG tablet Take 15 mg by mouth every Wednesday. WEDNESDAYS AT LUNCH- takes differently     mometasone (ELOCON) 0.1 % cream Apply topically.     Multiple Vitamins-Minerals (MULTIVITAMIN WITH MINERALS) tablet Take 1 tablet by mouth daily with breakfast.      naproxen sodium (ALEVE) 220 MG tablet Take 220 mg by mouth daily as needed.     nitroGLYCERIN (NITROSTAT) 0.4 MG SL tablet Place under the tongue.     Propylene Glycol 0.6 % SOLN Apply to eye. Takes 4 times a day, systane     QUEtiapine (SEROQUEL XR) 300 MG 24 hr tablet TAKE ONE TABLET (300 MG TOTAL) BY MOUTH AT BEDTIME. 90 tablet 1   QUEtiapine (SEROQUEL XR) 50 MG TB24 24 hr tablet Take 1 tablet (50 mg total) by mouth at bedtime. Take along with 300 mg 90 tablet 0   temazepam (RESTORIL) 7.5 MG capsule TAKE ONE CAPSULE BY MOUTH ONCE A DAY AT BEDTIME 30 capsule 5   tolterodine (DETROL LA) 4 MG 24 hr capsule Take 1 capsule (4 mg total) by mouth daily. 90 capsule 3   divalproex (DEPAKOTE ER)  500 MG 24 hr tablet TAKE ONE TABLET BY MOUTH TWICE A DAY 180 tablet 1   ezetimibe (ZETIA) 10 MG tablet Take 1 tablet by mouth daily.     hydroxychloroquine (PLAQUENIL) 200 MG tablet Take 200 mg by mouth daily.     pravastatin (PRAVACHOL) 80 MG tablet Take 1 tablet by mouth at bedtime.     No current facility-administered medications for this visit.     Musculoskeletal: Strength & Muscle Tone: within normal limits Gait & Station:  walks with a cane Patient leans: N/A  Psychiatric Specialty Exam: Review of Systems  Constitutional:  Positive for appetite change and fatigue.  Genitourinary:  Positive for frequency.  Psychiatric/Behavioral:  Positive for dysphoric mood and sleep disturbance.     Blood pressure 122/82, pulse 82, temperature 97.8 F (36.6 C), temperature source Temporal, height 4\' 9"  (1.448 m), weight 98 lb 12.8 oz (44.8 kg), SpO2 95%.Body mass index is 21.38 kg/m.  General Appearance: Casual  Eye Contact:  Fair  Speech:  Clear and Coherent  Volume:  Normal  Mood:  Depressed  Affect:  Congruent  Thought Process:  Goal Directed and Descriptions of Associations: Intact  Orientation:  Full (Time, Place, and Person)  Thought Content: Logical   Suicidal Thoughts:  No  Homicidal Thoughts:  No  Memory:  Immediate;   Fair Recent;   Fair Remote;   Fair  Judgement:  Fair  Insight:  Fair  Psychomotor Activity:  Normal  Concentration:  Concentration: Fair and Attention Span: Fair  Recall:  [employer] of Knowledge: Fair  Language: Fair  Akathisia:  No  Handed:  Right  AIMS (if indicated): done  Assets:  Desire for Improvement Housing Social Support Transportation  ADL's:  Intact  Cognition: WNL  Sleep:  {BHH GOOD/FAIR/POOR:22877}   Screenings: Midwife Visit from 11/12/2022 in [facility] Office Visit from 06/29/2022 in [facility] Office Visit from 04/29/2022 in  [facility] Office Visit from 01/26/2022 in [facility] Visit from 11/26/2021 in [facility]  AIMS Total Score 0 0 1 0 0      AUDIT    Flowsheet Row Admission (Discharged) from 01/02/2020 in [facility]  Alcohol Use Disorder Identification Test Final Score (AUDIT) 0      GAD-7    Flowsheet Row Office Visit from 04/29/2022 in [facility] Office Visit from 01/26/2022 in [facility] Office Visit from 11/26/2021 in [facility] Office Visit from 07/17/2020 in [facility]  Total GAD-7 Score 7 4 9 5       PHQ2-9    Flowsheet Row Office Visit from 06/29/2022 in [facility] Office Visit from 04/29/2022 in [facility] Office Visit from 01/26/2022 in [facility] Office Visit from 11/26/2021 in [facility] Office Visit from 08/26/2021 in [facility]  PHQ-2 Total Score 0 3 0 2 0  PHQ-9 Total Score 5 9 -- 7 5      Flowsheet Row Office Visit from 02/11/2023 in [facility] Office Visit from 11/12/2022 in [facility] ED from 09/27/2022 in [facility] Emergency Department at [facility]  C-SSRS RISK CATEGORY No Risk No Risk No Risk        Assessment and Plan: ZULEMA PULASKI is a 78 year old Caucasian female, divorced, lives in [place], was evaluated in office today, discussed assessment and plan as noted below.  Bipolar I Disorder depressed, moderate-unstable Bipolar I Disorder with exacerbation of  symptoms during winter months. Increased Seroquel dosage herself few days ago from 300 mg to 350 mg, resulting in some improvement. Denies suicidal ideation but reports sleep disturbances and decreased energy. Also experiencing anxiety. Emphasized maintaining current medication regimen and benefits of light exposure . Discussed potential side effects of increased Seroquel, such as dizziness and falls, and advised caution with nighttime mobility. - Increase Seroquel 350 mg at bedtime - Encourage exposure to natural light and consider using a light therapy device - Continue Tegretol extended release 400 mg twice daily - Continue Depakote extended release 500 mg twice daily - Continue Gabapentin 300 mg twice daily. - Monitor mood and anxiety symptoms - Use walker or cane for nighttime mobility to prevent falls - Encourage social interaction, such as attending church - Follow up in the first week of April  Generalized anxiety disorder-improving Currently reports anxiety is manageable. - Continue current medication regimen. - Continue CBT with Ms. Felecia Jan.  Sleep Disturbance-unstable Reports difficulty sleeping, possibly related to increased nighttime urination and mood disturbances. Currently taking temazepam for sleep but reports waking up  during the night. Discussed evaluating potential causes of nighttime awakenings, such as urinary frequency. - Continue Temazepam 7.5 mg at bedtime as needed for sleep - [place]ed [place] - Evaluate and address potential causes of nighttime awakenings, such as urinary frequency   Follow-up -  Contact primary care provider to discuss repeating urine analysis to ensure UTI resolution - Encourage consumption of nutrient-rich foods and meals - Consider nutritional supplements such as Boost or Ensure - Monitor weight and nutritional intake - Follow-up in clinic in 2 weeks or sooner if needed.  Collaboration of Care: Collaboration of Care: Other  collateral information obtained from caregiver who was present in session.  Advised to monitor weight, food intake as well as follow up with primary care provider.  Patient/Guardian was advised Release of Information must be obtained prior to any record release in order to collaborate their care with an outside provider. Patient/Guardian was advised if they have not already done so to contact the registration department to sign all necessary forms in order for [place] to release information regarding their care.   Consent: Patient/Guardian gives verbal consent for treatment and assignment of benefits for services provided during this visit. Patient/Guardian expressed understanding and agreed to proceed.  Discussed the use of a AI scribe software for clinical note transcription with the patient, who gave verbal consent to proceed.  This note was generated in part or whole with voice recognition software. Voice recognition is usually quite accurate but there are transcription errors that can and very often do occur. I apologize for any typographical errors that were not detected and corrected.     Jomarie Longs, MD 05/27/2023, 1:03 PM

## 2023-05-31 ENCOUNTER — Ambulatory Visit
Admission: RE | Admit: 2023-05-31 | Discharge: 2023-05-31 | Disposition: A | Discharge status: Home / Self Care | Payer: Medicare PPO | Source: Ambulatory Visit | Attending: Family Medicine | Admitting: Family Medicine

## 2023-05-31 DIAGNOSIS — Z1231 Encounter for screening mammogram for malignant neoplasm of breast: Secondary | ICD-10-CM | POA: Insufficient documentation

## 2023-06-09 ENCOUNTER — Other Ambulatory Visit: Payer: Self-pay

## 2023-06-09 ENCOUNTER — Telehealth: Payer: Self-pay

## 2023-06-09 ENCOUNTER — Encounter: Payer: Self-pay | Admitting: Psychiatry

## 2023-06-09 ENCOUNTER — Ambulatory Visit (INDEPENDENT_AMBULATORY_CARE_PROVIDER_SITE_OTHER): Payer: Self-pay | Admitting: Psychiatry

## 2023-06-09 VITALS — BP 128/74 | HR 70 | Temp 95.2°F | Ht <= 58 in | Wt 100.2 lb

## 2023-06-09 DIAGNOSIS — F3175 Bipolar disorder, in partial remission, most recent episode depressed: Secondary | ICD-10-CM | POA: Diagnosis not present

## 2023-06-09 DIAGNOSIS — G2401 Drug induced subacute dyskinesia: Secondary | ICD-10-CM

## 2023-06-09 DIAGNOSIS — F411 Generalized anxiety disorder: Secondary | ICD-10-CM | POA: Diagnosis not present

## 2023-06-09 DIAGNOSIS — F5101 Primary insomnia: Secondary | ICD-10-CM

## 2023-06-09 DIAGNOSIS — Z9181 History of falling: Secondary | ICD-10-CM

## 2023-06-09 MED ORDER — QUETIAPINE FUMARATE ER 300 MG PO TB24: 300.0000 mg | ORAL_TABLET | Freq: Every day | ORAL | 1 refills | Status: DC

## 2023-06-09 MED ORDER — TEMAZEPAM 7.5 MG PO CAPS: 7.5000 mg | ORAL_CAPSULE | Freq: Every evening | ORAL | 5 refills | Status: DC | PRN

## 2023-06-09 NOTE — Telephone Encounter (Signed)
 pt called left a message that she wanted to make sure that you knew to send medications to [facility] and they due the bubble pack. she states that they suppose to deliever today. she need it send in asap.

## 2023-06-09 NOTE — Telephone Encounter (Signed)
 Yes, her medications which were due for refills were sent to her pharmacy.

## 2023-06-09 NOTE — Progress Notes (Unsigned)
 BH MD OP Progress Note  06/09/2023 11:01 AM Stephanie [name]  MRN:  [id number]  Chief Complaint:  Chief Complaint  Patient presents with   Follow-up   Anxiety   Depression   Medication Refill   HPI: Stephanie [name] is a 78 year old Caucasian female, divorced, lives in [place], has a history of bipolar disorder, GAD, primary insomnia memory loss, hypothyroidism, coronary artery disease, IBS, hyperlipidemia, fibromyalgia, GERD was evaluated in office today.  She has experienced an improvement in mood since the last visit, which she attributes to an increased dosage of Seroquel at 350 mg.  Denies suicidal thoughts, hallucinations or paranoia. She had previously used expired Seroquel when unable to get an appointment, which she felt was still effective.  She has a refill for Seroquel pending at her pharmacy.  Agrees to pick it up.  She has gained two pounds since the last visit, although she remains below her previous weight of 106 pounds. She attributes the weight gain to making her own smoothies, including a banana shake with fresh blueberries provided by [employer]. She dislikes commercial nutritional drinks like Boost or Ensure.  She reports variable sleep quality and takes temazepam most nights to aid sleep.  She had concerns about frequent urination, initially thought to be a UTI, but attributed to increased water intake as advised by her primary care doctor. She has adjusted her fluid intake by diluting cranberry juice and reducing Gatorade consumption, which she hasn't had in about a month. This adjustment has helped reduce nighttime urination.  She experienced a fall two days ago while carrying water for her outdoor cat, resulting in hitting her hip and back on a cement patio. She did not recall hitting her head but had a headache the following day. She plans to have her caregivers manage the outdoor cat's needs to prevent future falls.  She sees her therapist, Stephanie [name], once a  month, with the next appointment scheduled for April 23rd.  She appeared to be alert, oriented to person place time situation.  3 word memory immediate 3 out of 3.  After 5 minutes 2 out of 3.  Attention and focus seem to be good and was able to do serial sevens.  Visit Diagnosis:    ICD-10-CM   1. Bipolar disorder, in partial remission, most recent episode depressed (HCC)  F31.75    Type I    2. GAD (generalized anxiety disorder)  F41.1 QUEtiapine (SEROQUEL XR) 300 MG 24 hr tablet    3. Primary insomnia  F51.01 QUEtiapine (SEROQUEL XR) 300 MG 24 hr tablet    temazepam (RESTORIL) 7.5 MG capsule    4. Tardive dyskinesia  G24.01     5. At risk for falls  Z91.81       Past Psychiatric History: I have reviewed past psychiatric history from progress note on 06/17/2017.  Inpatient [facility] health admission at [facility]-01/02/2020 - 01/11/2020.  Past Medical History:  Past Medical History:  Diagnosis Date   Anginal pain (HCC)    Anxiety    Bipolar 1 disorder (HCC)    Bipolar disorder (HCC)    Coronary artery disease    Depression    Fatigue    Fibromyalgia    GERD (gastroesophageal reflux disease)    Heart disease    Heart failure (HCC)    HLD (hyperlipidemia)    HOH (hard of hearing)    Hypothyroidism    IBS (irritable bowel syndrome)    Pelvic pain in  female    Perianal lesion    high grade sil lesion- keratinizing type   RA (rheumatoid arthritis) (HCC)    Raynaud phenomenon    Sjogren's disease (HCC)    Sleep apnea    Vaginal Pap smear, abnormal     Past Surgical History:  Procedure Laterality Date   CATARACT EXTRACTION W/PHACO Left 02/23/2017   Procedure: CATARACT EXTRACTION PHACO AND INTRAOCULAR LENS PLACEMENT (IOC);  Surgeon: Galen Manila, MD;  Location: [facility] ORS;  Service: Ophthalmology;  Laterality: Left;  [place] 00:35 AP% 9.6 CDE 3.47 Fluid pack lot # [id number]   CATARACT EXTRACTION W/PHACO Right 03/30/2017   Procedure: CATARACT EXTRACTION  PHACO AND INTRAOCULAR LENS PLACEMENT (IOC);  Surgeon: Galen Manila, MD;  Location: [facility] ORS;  Service: Ophthalmology;  Laterality: Right;  [place] 00:32.6 AP% 13.8 CDE 4.51 Fluid Pack Lot # [id number]   EYE SURGERY     HYSTEROSCOPY WITH D & C N/A 12/22/2021   Procedure: DILATATION AND CURETTAGE /HYSTEROSCOPY;  Surgeon: Hildred Laser, MD;  Location: [facility] ORS;  Service: Gynecology;  Laterality: N/A;   TONSILLECTOMY      Family Psychiatric History: I have reviewed family psychiatric history from progress note on 06/17/2017.  Family History:  Family History  Problem Relation Age of Onset   Heart failure Father    Parkinson's disease Mother    Bipolar disorder Mother    Depression Sister    Schizophrenia Paternal Grandmother    Diabetes Maternal Grandmother    Pancreatic cancer Maternal Grandmother    Cancer Neg Hx    Heart disease Neg Hx    Breast cancer Neg Hx     Social History: I have reviewed social history from progress note on 06/17/2017. Social History   Socioeconomic History   Marital status: Divorced    Spouse name: Not on file   Number of children: 2   Years of education: Not on file   Highest education level: Master's degree (e.g., MA, MS, MEng, MEd, MSW, MBA)  Occupational History    Comment: retired2  Tobacco Use   Smoking status: Never   Smokeless tobacco: Former  Building services engineer status: Never Used  Substance and Sexual Activity   Alcohol use: No    Alcohol/week: 0.0 standard drinks of alcohol   Drug use: No   Sexual activity: Never    Birth control/protection: Post-menopausal  Other Topics Concern   Not on file  Social History Narrative   Lives alone   Social Drivers of Health   Financial Resource Strain: Patient Declined (02/02/2023)   Received from [facility]   Overall Financial Resource Strain (CARDIA)    Difficulty of Paying Living Expenses: Patient declined  Food Insecurity: Patient Declined (02/02/2023)   Received from  [facility]   Hunger Vital Sign    Worried About Running Out of Food in the Last Year: Patient declined    Ran Out of Food in the Last Year: Patient declined  Transportation Needs: Patient Declined (02/02/2023)   Received from [facility] - Transportation    In the past 12 months, has lack of transportation kept you from medical appointments or from getting medications?: Patient declined    Lack of Transportation (Non-Medical): Patient declined  Physical Activity: Inactive (01/26/2017)   Exercise Vital Sign    Days of Exercise per Week: 0 days    Minutes of Exercise per Session: 0 min  Stress: No Stress Concern Present (01/26/2017)  [employer] of Occupational Health - Occupational Stress Questionnaire    Feeling of Stress : Only a little  Social Connections: Unknown (01/26/2017)   Social Connection and Isolation Panel [NHANES]    Frequency of Communication with Friends and Family: Not on file    Frequency of Social Gatherings with Friends and Family: Not on file    Attends Religious Services: Never    Database administrator or Organizations: No    Attends Banker Meetings: Never    Marital Status: Divorced    Allergies:  Allergies  Allergen Reactions   Amoxicillin-Pot Clavulanate Nausea Only and Other (See Comments)    Diarrhea Has patient had a PCN reaction causing immediate rash, facial/tongue/throat swelling, SOB or lightheadedness with hypotension:  Has patient had a PCN reaction causing severe rash involving mucus membranes or skin necrosis: Has patient had a PCN reaction that required hospitalization:  Has patient had a PCN reaction occurring within the last 10 years: If all of the above answers are "NO", then may proceed with Cephalosporin use.    Oxycodone-Acetaminophen Nausea Only   Erythromycin Nausea Only   Azithromycin Nausea And Vomiting   Codeine    Macrolides And Ketolides     Oxycodone-Acetaminophen    Penicillins Other (See Comments)    Has patient had a PCN reaction causing immediate rash, facial/tongue/throat swelling, SOB or lightheadedness with hypotension: NO Has patient had a PCN reaction causing severe rash involving mucus membranes or skin necrosis: NO Has patient had a PCN reaction that required hospitalization: NO Has patient had a PCN reaction occurring within the last 10 years: NO If all of the above answers are "NO", then may proceed with Cephalosporin use.    Remeron [Mirtazapine]     Side effect-likely tardive dyskinesia   Tramadol Nausea And Vomiting   Zolpidem     Sleep walking   Lamotrigine Rash    LAMICTAL   Latex Rash    Latex bandages   Sulfa Antibiotics Rash    Metabolic Disorder Labs: Lab Results  Component Value Date   HGBA1C 5.3 11/22/2017   Lab Results  Component Value Date   PROLACTIN 33.0 (H) 11/22/2017   Lab Results  Component Value Date   CHOL 222 (H) 01/02/2020   TRIG 190 (H) 01/02/2020   HDL 69 01/02/2020   CHOLHDL 3.2 01/02/2020   VLDL 38 01/02/2020   LDLCALC 115 (H) 01/02/2020   LDLCALC 119 (H) 11/22/2017   Lab Results  Component Value Date   TSH 2.678 01/02/2020    Therapeutic Level Labs: No results found for: "LITHIUM" Lab Results  Component Value Date   VALPROATE 67 11/17/2022   VALPROATE 90 10/16/2021   Lab Results  Component Value Date   CBMZ 7.8 11/17/2022   CBMZ 7.4 10/16/2021    Current Medications: Current Outpatient Medications  Medication Sig Dispense Refill   Acetaminophen 500 MG capsule Take 1,000 mg by mouth in the morning, at noon, and at bedtime. tid     aspirin 81 MG chewable tablet Chew 81 mg by mouth daily with breakfast.     Calcium Citrate-Vitamin D 200-250 MG-UNIT TABS Take 1 tablet by mouth daily after breakfast.      carbamazepine (TEGRETOL XR) 200 MG 12 hr tablet Take 2 tablets (400 mg total) by mouth 2 (two) times daily. 360 tablet 3   divalproex (DEPAKOTE ER) 500  MG 24 hr tablet TAKE ONE TABLET BY MOUTH TWICE A DAY 180 tablet 1   fluticasone (  FLONASE) 50 MCG/ACT nasal spray      folic acid (FOLVITE) 1 MG tablet Take 2 mg by mouth daily with breakfast.      gabapentin (NEURONTIN) 300 MG capsule TAKE 1 CAPSULE BY MOUTH TWICE DAILY 180 capsule 3   hydroxychloroquine (PLAQUENIL) 200 MG tablet Take 200 mg by mouth daily.     hypromellose (GENTEAL) 0.3 % GEL ophthalmic ointment Apply to eye.     ibandronate (BONIVA) 150 MG tablet every 30 (thirty) days.     levothyroxine (SYNTHROID, LEVOTHROID) 75 MCG tablet Take 75 mcg by mouth daily before breakfast.      methotrexate 2.5 MG tablet Take 15 mg by mouth every Wednesday. WEDNESDAYS AT LUNCH- takes differently     mometasone (ELOCON) 0.1 % cream Apply topically.     Multiple Vitamins-Minerals (MULTIVITAMIN WITH MINERALS) tablet Take 1 tablet by mouth daily with breakfast.      naproxen sodium (ALEVE) 220 MG tablet Take 220 mg by mouth daily as needed.     nitroGLYCERIN (NITROSTAT) 0.4 MG SL tablet Place under the tongue.     Propylene Glycol 0.6 % SOLN Apply to eye. Takes 4 times a day, systane     QUEtiapine (SEROQUEL XR) 50 MG TB24 24 hr tablet Take 1 tablet (50 mg total) by mouth at bedtime. Take along with 300 mg 90 tablet 0   tolterodine (DETROL LA) 4 MG 24 hr capsule Take 1 capsule (4 mg total) by mouth daily. 90 capsule 3   ezetimibe (ZETIA) 10 MG tablet Take 1 tablet by mouth daily.     pravastatin (PRAVACHOL) 80 MG tablet Take 1 tablet by mouth at bedtime.     QUEtiapine (SEROQUEL XR) 300 MG 24 hr tablet Take 1 tablet (300 mg total) by mouth at bedtime. 90 tablet 1   [START ON 06/30/2023] temazepam (RESTORIL) 7.5 MG capsule Take 1 capsule (7.5 mg total) by mouth at bedtime as needed for sleep. 30 capsule 5   No current facility-administered medications for this visit.     Musculoskeletal: Strength & Muscle Tone: within normal limits Gait & Station: normal Patient leans: N/A  Psychiatric  Specialty Exam: Review of Systems  Psychiatric/Behavioral:  Positive for dysphoric mood and sleep disturbance.     Blood pressure 128/74, pulse 70, temperature (!) 95.2 F (35.1 C), temperature source Skin, height 4\' 9"  (1.448 m), weight 100 lb 3.2 oz (45.5 kg).Body mass index is 21.68 kg/m.  General Appearance: Casual  Eye Contact:  Fair  Speech:  Normal Rate  Volume:  Normal  Mood:  Depressed improving  Affect:  Congruent  Thought Process:  Goal Directed and Descriptions of Associations: Intact  Orientation:  Full (Time, Place, and Person)  Thought Content: Logical   Suicidal Thoughts:  No  Homicidal Thoughts:  No  Memory:  Immediate;   Fair Recent;   Fair Remote;   Fair  Judgement:  Fair  Insight:  Fair  Psychomotor Activity:  Normal  Concentration:  Concentration: Fair and Attention Span: Fair  Recall:  [employer] of Knowledge: Fair  Language: Fair  Akathisia:  No  Handed:  Right  AIMS (if indicated): done  Assets:  Desire for Improvement Housing Social Support Transportation  ADL's:  Intact  Cognition: WNL  Sleep:   varies   Screenings: Geneticist, molecular Office Visit from 05/27/2023 in [facility] Office Visit from 11/12/2022 in [facility] Office Visit from 06/29/2022 in [facility] Office Visit from 04/29/2022 in [facility] Office Visit from 01/26/2022 in [facility]  AIMS Total Score 0 0 0 1 0      AUDIT    Flowsheet Row Admission (Discharged) from 01/02/2020 in [facility]  Alcohol Use Disorder Identification Test Final Score (AUDIT) 0      GAD-7    Flowsheet Row Office Visit from 05/27/2023 in [facility] Office Visit from 04/29/2022 in [facility]  Office Visit from 01/26/2022 in [facility] Office Visit from 11/26/2021 in [facility] Office Visit from 07/17/2020 in [facility]  Total GAD-7 Score 11 7 4 9 5       PHQ2-9    Flowsheet Row Office Visit from 06/09/2023 in [facility] Office Visit from 05/27/2023 in [facility] Office Visit from 06/29/2022 in [facility] Office Visit from 04/29/2022 in [facility] Office Visit from 01/26/2022 in [facility]  PHQ-2 Total Score 2 4 0 3 0  PHQ-9 Total Score 8 16 5 9  --      Flowsheet Row Office Visit from 06/09/2023 in [facility] Office Visit from 05/27/2023 in [facility] Office Visit from 02/11/2023 in [facility]  C-SSRS RISK CATEGORY No Risk Low Risk No Risk        Assessment and Plan: TARENA GOCKLEY is a 78 year old Caucasian female, divorced, lives in [place] was evaluated in office today.  Discussed assessment and plan as noted below.  Bipolar disorder type I moderate in partial remission Depressive disorder with improvement on increased Seroquel dosage to 350 mg. Reports improved mood and appetite, evidenced by weight gain. Temazepam effectively aids sleep. Denies suicidal ideation, hallucinations, or paranoia. Aware of seasonal mood changes and plans to adjust Seroquel dosage accordingly. Previously used expired Seroquel, which remained effective. - Continue Seroquel 350 mg daily - Continue Tegretol extended release 400 mg twice daily ( level -7.8-9/12/2022) - Continue Depakote extended release 500 mg twice daily (Depakote level- 67 , 11/17/22) -  Continue Gabapentin 300 mg twice daily - Maintain current dietary habits to support weight gain - Consider reducing Seroquel dosage to 300 mg by the end of May if mood remains well-managed - Ensure timely pickup of Seroquel prescription from pharmacy  Generalized anxiety disorder-improving Currently reports anxiety symptoms as manageable.  Continued to be motivated to work with therapist. - Continue current medication regimen - Continue CBT with Ms. Felecia Jan  Insomnia-improving Reports sleep as improved since she does not have to wake up too many times at night to urinate anymore. - Continue Temazepam 7.5 mg at bedtime as needed for sleep - Reviewed [place] PMP AWARxE  Fall risk Experienced a fall two days ago while carrying water, impacting hip and back. No recollection of head impact but had a headache the following day. Fall occurred on a hard cement patio without cane use due to carrying a heavy container. Caregivers available to assist with tasks to prevent future falls. - Instruct caregivers to assist with outdoor tasks to prevent falls - Notify primary care physician about the fall for further evaluation and management  Follow-up - Attend therapy appointment on April  23rd - Inform primary care physician about the fall - Schedule follow-up appointment with this provider in 3 months or sooner if needed.     Collaboration of Care: Collaboration of Care: Primary Care Provider AEB encouraged to reach out to primary care provider due to recent fall and Referral or follow-up with counselor/therapist AEB encouraged to continue psychotherapy sessions with therapist.  Patient/Guardian was advised Release of Information must be obtained prior to any record release in order to collaborate their care with an outside provider. Patient/Guardian was advised if they have not already done so to contact the registration department to sign all necessary forms in order for [place] to release information  regarding their care.   Consent: Patient/Guardian gives verbal consent for treatment and assignment of benefits for services provided during this visit. Patient/Guardian expressed understanding and agreed to proceed.  This note was generated in part or whole with voice recognition software. Voice recognition is usually quite accurate but there are transcription errors that can and very often do occur. I apologize for any typographical errors that were not detected and corrected.    Discussed the use of a AI scribe software for clinical note transcription with the patient, who gave verbal consent to proceed.    Jomarie Longs, MD 06/10/2023, 8:26 AM

## 2023-06-09 NOTE — Patient Instructions (Signed)
 VISIT SUMMARY:  During your visit, we discussed your ongoing management of depression and weight, as well as a recent fall you experienced. You reported an improvement in mood with your current medication regimen and have gained some weight. We also addressed your sleep quality and frequent urination concerns. Additionally, we reviewed your recent fall and made plans to prevent future incidents.  YOUR PLAN:  -BIPOLAR DEPRESSIVE DISORDER: Major depressive disorder is a mental health condition characterized by persistent feelings of sadness and loss of interest. Your mood has improved with the increased dosage of Seroquel to 350 mg, and you are also using temazepam to aid sleep. Continue taking Seroquel 350 mg daily and temazepam as needed. Maintain your current dietary habits to support weight gain. If your mood remains stable, consider reducing the Seroquel dosage to 300 mg by the end of May. Ensure timely pickup of your Seroquel prescription from the pharmacy.  -FALL RISK: You experienced a fall recently while carrying water, which resulted in hitting your hip and back. To prevent future falls, your caregivers will assist with outdoor tasks. Please notify your primary care physician about the fall for further evaluation and management.  -COGNITIVE FUNCTION: Your brief cognitive assessment showed that your cognitive function is intact, with good recall and performance on tasks. No immediate concerns were noted.  INSTRUCTIONS:  Please attend your therapy appointment with Inetta Fermo on April 23rd. Inform your primary care physician about your recent fall. Schedule a follow-up appointment with your psychiatrist as needed.

## 2023-06-10 DIAGNOSIS — Z9181 History of falling: Secondary | ICD-10-CM | POA: Insufficient documentation

## 2023-06-22 ENCOUNTER — Encounter: Admitting: Dermatology

## 2023-06-29 ENCOUNTER — Ambulatory Visit: Admitting: Dermatology

## 2023-06-29 ENCOUNTER — Encounter: Payer: Self-pay | Admitting: Dermatology

## 2023-06-29 DIAGNOSIS — D229 Melanocytic nevi, unspecified: Secondary | ICD-10-CM

## 2023-06-29 DIAGNOSIS — L814 Other melanin hyperpigmentation: Secondary | ICD-10-CM | POA: Diagnosis not present

## 2023-06-29 DIAGNOSIS — L821 Other seborrheic keratosis: Secondary | ICD-10-CM

## 2023-06-29 DIAGNOSIS — L578 Other skin changes due to chronic exposure to nonionizing radiation: Secondary | ICD-10-CM | POA: Diagnosis not present

## 2023-06-29 DIAGNOSIS — Z1283 Encounter for screening for malignant neoplasm of skin: Secondary | ICD-10-CM | POA: Diagnosis not present

## 2023-06-29 DIAGNOSIS — W908XXA Exposure to other nonionizing radiation, initial encounter: Secondary | ICD-10-CM

## 2023-06-29 DIAGNOSIS — D1801 Hemangioma of skin and subcutaneous tissue: Secondary | ICD-10-CM

## 2023-06-29 DIAGNOSIS — I872 Venous insufficiency (chronic) (peripheral): Secondary | ICD-10-CM

## 2023-06-29 NOTE — Patient Instructions (Addendum)

## 2023-06-29 NOTE — Progress Notes (Signed)
   Follow-Up Visit   Subjective  Stephanie [name] is a 78 y.o. female who presents for the following: Skin Cancer Screening and Full Body Skin Exam  The patient presents for Total-Body Skin Exam (TBSE) for skin cancer screening and mole check. The patient has spots, moles and lesions to be evaluated, some may be new or changing and the patient may have concern these could be cancer.    The following portions of the chart were reviewed this encounter and updated as appropriate: medications, allergies, medical history  Review of Systems:  No other skin or systemic complaints except as noted in HPI or Assessment and Plan.  Objective  Well appearing patient in no apparent distress; mood and affect are within normal limits.  A full examination was performed including scalp, head, eyes, ears, nose, lips, neck, chest, axillae, abdomen, back, buttocks, bilateral upper extremities, bilateral lower extremities, hands, feet, fingers, toes, fingernails, and toenails. All findings within normal limits unless otherwise noted below.   Relevant physical exam findings are noted in the Assessment and Plan.    Assessment & Plan   SKIN CANCER SCREENING PERFORMED TODAY.  ACTINIC DAMAGE - Chronic condition, secondary to cumulative UV/sun exposure - diffuse scaly erythematous macules with underlying dyspigmentation - Recommend daily broad spectrum sunscreen SPF 30+ to sun-exposed areas, reapply every 2 hours as needed.  - Staying in the shade or wearing long sleeves, sun glasses (UVA+UVB protection) and wide brim hats (4-inch brim around the entire circumference of the hat) are also recommended for sun protection.  - Call for new or changing lesions.  LENTIGINES, SEBORRHEIC KERATOSES, HEMANGIOMAS - Benign normal skin lesions - Benign-appearing - Call for any changes  MELANOCYTIC NEVI - Tan-brown and/or pink-flesh-colored symmetric macules and papules - Benign appearing on exam today - Observation -  Call clinic for new or changing moles - Recommend daily use of broad spectrum spf 30+ sunscreen to sun-exposed areas.   STASIS DERMATITIS Exam: Erythematous golden to brown pigmented scaly patches involving the ankle and distal lower leg with associated lower leg edema bilateral.  flared  Stasis in the legs causes chronic leg swelling, which may result in itchy or painful rashes, skin discoloration, skin texture changes, and sometimes ulceration.  Recommend daily graduated compression hose/stockings- easiest to put on first thing in morning, remove at bedtime.  Elevate legs as much as possible. Avoid salt/sodium rich foods.  Treatment Plan: Recommend compression stockings daily. Elevate legs daily   MULTIPLE BENIGN NEVI   LENTIGINES   ACTINIC ELASTOSIS   SEBORRHEIC KERATOSES   CHERRY ANGIOMA   VENOUS STASIS DERMATITIS OF BOTH LOWER EXTREMITIES   Return in about 1 year (around 06/28/2024) for TBSE.  Kerstin Peeling, RMA, am acting as scribe for Harris Liming, MD .   Documentation: I have reviewed the above documentation for accuracy and completeness, and I agree with the above.  Harris Liming, MD

## 2023-08-17 ENCOUNTER — Encounter: Admitting: Dermatology

## 2023-09-16 ENCOUNTER — Encounter: Payer: Self-pay | Admitting: Psychiatry

## 2023-09-16 ENCOUNTER — Ambulatory Visit: Admitting: Psychiatry

## 2023-09-16 VITALS — BP 116/78 | HR 83 | Temp 98.0°F | Ht <= 58 in | Wt 98.0 lb

## 2023-09-16 DIAGNOSIS — F5101 Primary insomnia: Secondary | ICD-10-CM

## 2023-09-16 DIAGNOSIS — F411 Generalized anxiety disorder: Secondary | ICD-10-CM

## 2023-09-16 DIAGNOSIS — G2401 Drug induced subacute dyskinesia: Secondary | ICD-10-CM

## 2023-09-16 DIAGNOSIS — F3176 Bipolar disorder, in full remission, most recent episode depressed: Secondary | ICD-10-CM

## 2023-09-16 MED ORDER — QUETIAPINE FUMARATE ER 50 MG PO TB24: 50.0000 mg | ORAL_TABLET | Freq: Every day | ORAL | 1 refills | Status: DC

## 2023-09-16 NOTE — Progress Notes (Signed)
 BH MD OP Progress Note  09/16/2023 12:35 PM Stephanie [name]  MRN:  [id number]  Chief Complaint:  Chief Complaint  Patient presents with   Follow-up   Anxiety   Depression   Medication Refill   Discussed the use of AI scribe software for clinical note transcription with the patient, who gave verbal consent to proceed.  History of Present Illness Stephanie [name] is a 78 year old Caucasian female, divorced, lives in [place], has a history of bipolar disorder, GAD, primary insomnia, memory loss, hypothyroidism, coronary artery disease, IBS, hyperlipidemia, fibromyalgia, GERD was evaluated in office today for a follow-up appointment.  Mood symptoms have been stable over the past three months while continuing on Seroquel  350 mg. She has experienced weight fluctuation. She attributes some of her dietary intake to Meals on Wheels and Healthy Choice frozen meals, which she finds more palatable.  She describes a history of falls, with two incidents, most recent one occurring on June 3rd. The first fall happened outside on her patio when she lost her balance while checking on her cats, resulting in a fall on her left hip. The second fall occurred indoors on her kitchen floor, where she fell backwards and injured her right hip and back.  She is in communication with her primary care provider who is aware of the falls.  She reports a regular exercise routine, which includes daily stationary bike sessions and occasional visits to [employer] with her caregivers.  She has caregivers who assist her Monday through Friday, but she manages independently on weekends. She uses a stationary bike for exercise and wants to resume Tai Chi, which she previously practiced using a DVD that is no longer functional.  Denies thoughts of self-harm or harm to others, and no auditory or visual hallucinations. She manages her own bills and daily activities with some assistance from caregivers for tasks like laundry. She  reports sleeping well overall, though she experienced a bad dream during recent inclement weather.  She appeared to be alert, oriented to person place time situation.  3 word memory immediate 3 out of 3, after 5 minutes 3 out of 3.  She appeared to do well with serial sevens with some support.  Attention and focus seem to be fairly within normal limits.  She continues to follow up with Ms. Ellouise Hummer and is motivated to stay in therapy.    Visit Diagnosis:    ICD-10-CM   1. Bipolar disorder, in full remission, most recent episode depressed (HCC)  F31.76 QUEtiapine  (SEROQUEL  XR) 50 MG TB24 24 hr tablet   Type I    2. GAD (generalized anxiety disorder)  F41.1     3. Primary insomnia  F51.01     4. Tardive dyskinesia  G24.01       Past Psychiatric History: I have reviewed past psychiatric history from progress note on 06/17/2017.  Inpatient mental health admission at [facility]-01/02/2020 - 01/11/2020.  Past Medical History:  Past Medical History:  Diagnosis Date   Anginal pain (HCC)    Anxiety    Bipolar 1 disorder (HCC)    Bipolar disorder (HCC)    Coronary artery disease    Depression    Fatigue    Fibromyalgia    GERD (gastroesophageal reflux disease)    Heart disease    Heart failure (HCC)    HLD (hyperlipidemia)    HOH (hard of hearing)    Hypothyroidism    IBS (irritable bowel syndrome)  Pelvic pain in female    Perianal lesion    high grade sil lesion- keratinizing type   RA (rheumatoid arthritis) (HCC)    Raynaud phenomenon    Sjogren's disease (HCC)    Sleep apnea    Vaginal Pap smear, abnormal     Past Surgical History:  Procedure Laterality Date   CATARACT EXTRACTION W/PHACO Left 02/23/2017   Procedure: CATARACT EXTRACTION PHACO AND INTRAOCULAR LENS PLACEMENT (IOC);  Surgeon: Jaye Fallow, MD;  Location: [facility] ORS;  Service: Ophthalmology;  Laterality: Left;  US  00:35 AP% 9.6 CDE 3.47 Fluid pack lot # [id number]   CATARACT  EXTRACTION W/PHACO Right 03/30/2017   Procedure: CATARACT EXTRACTION PHACO AND INTRAOCULAR LENS PLACEMENT (IOC);  Surgeon: Jaye Fallow, MD;  Location: [facility] ORS;  Service: Ophthalmology;  Laterality: Right;  US  00:32.6 AP% 13.8 CDE 4.51 Fluid Pack Lot # [id number]   EYE SURGERY     HYSTEROSCOPY WITH D & C N/A 12/22/2021   Procedure: DILATATION AND CURETTAGE /HYSTEROSCOPY;  Surgeon: Connell Davies, MD;  Location: [facility] ORS;  Service: Gynecology;  Laterality: N/A;   TONSILLECTOMY      Family Psychiatric History: I have reviewed family psychiatric history from progress note on 06/17/2017.  Family History:  Family History  Problem Relation Age of Onset   Heart failure Father    Parkinson's disease Mother    Bipolar disorder Mother    Depression Sister    Schizophrenia Paternal Grandmother    Diabetes Maternal Grandmother    Pancreatic cancer Maternal Grandmother    Cancer Neg Hx    Heart disease Neg Hx    Breast cancer Neg Hx     Social History: I have reviewed social history from progress note on 06/17/2017. Social History   Socioeconomic History   Marital status: Divorced    Spouse name: Not on file   Number of children: 2   Years of education: Not on file   Highest education level: Master's degree (e.g., MA, MS, MEng, MEd, MSW, MBA)  Occupational History    Comment: retired2  Tobacco Use   Smoking status: Never   Smokeless tobacco: Former  Building services engineer status: Never Used  Substance and Sexual Activity   Alcohol use: No    Alcohol/week: 0.0 standard drinks of alcohol   Drug use: No   Sexual activity: Never    Birth control/protection: Post-menopausal  Other Topics Concern   Not on file  Social History Narrative   Lives alone   Social Drivers of Health   Financial Resource Strain: Patient Declined (02/02/2023)   Received from [facility]   Overall Financial Resource Strain (CARDIA)    Difficulty of Paying Living Expenses: Patient  declined  Food Insecurity: Patient Declined (02/02/2023)   Received from [facility]   Hunger Vital Sign    Within the past 12 months, you worried that your food would run out before you got the money to buy more.: Patient declined    Within the past 12 months, the food you bought just didn't last and you didn't have money to get more.: Patient declined  Transportation Needs: Patient Declined (02/02/2023)   Received from [facility] - Transportation    In the past 12 months, has lack of transportation kept you from medical appointments or from getting medications?: Patient declined    Lack of Transportation (Non-Medical): Patient declined  Physical Activity: Inactive (01/26/2017)   Exercise Vital Sign  Days of Exercise per Week: 0 days    Minutes of Exercise per Session: 0 min  Stress: No Stress Concern Present (01/26/2017)   [employer] of Occupational Health - Occupational Stress Questionnaire    Feeling of Stress : Only a little  Social Connections: Unknown (01/26/2017)   Social Connection and Isolation Panel    Frequency of Communication with Friends and Family: Not on file    Frequency of Social Gatherings with Friends and Family: Not on file    Attends Religious Services: Never    Active Member of Clubs or Organizations: No    Attends Banker Meetings: Never    Marital Status: Divorced    Allergies:  Allergies  Allergen Reactions   Amoxicillin-Pot Clavulanate Nausea Only and Other (See Comments)    Diarrhea Has patient had a PCN reaction causing immediate rash, facial/tongue/throat swelling, SOB or lightheadedness with hypotension:  Has patient had a PCN reaction causing severe rash involving mucus membranes or skin necrosis: Has patient had a PCN reaction that required hospitalization:  Has patient had a PCN reaction occurring within the last 10 years: If all of the above answers are NO, then may proceed  with Cephalosporin use.    Oxycodone-Acetaminophen  Nausea Only   Erythromycin Nausea Only   Azithromycin Nausea And Vomiting   Codeine    Macrolides And Ketolides    Oxycodone-Acetaminophen     Penicillins Other (See Comments)    Has patient had a PCN reaction causing immediate rash, facial/tongue/throat swelling, SOB or lightheadedness with hypotension: NO Has patient had a PCN reaction causing severe rash involving mucus membranes or skin necrosis: NO Has patient had a PCN reaction that required hospitalization: NO Has patient had a PCN reaction occurring within the last 10 years: NO If all of the above answers are NO, then may proceed with Cephalosporin use.    Remeron  [Mirtazapine ]     Side effect-likely tardive dyskinesia   Tramadol Nausea And Vomiting   Zolpidem      Sleep walking   Lamotrigine Rash    LAMICTAL   Latex Rash    Latex bandages   Sulfa Antibiotics Rash    Metabolic Disorder Labs: Lab Results  Component Value Date   HGBA1C 5.3 11/22/2017   Lab Results  Component Value Date   PROLACTIN 33.0 (H) 11/22/2017   Lab Results  Component Value Date   CHOL 222 (H) 01/02/2020   TRIG 190 (H) 01/02/2020   HDL 69 01/02/2020   CHOLHDL 3.2 01/02/2020   VLDL 38 01/02/2020   LDLCALC 115 (H) 01/02/2020   LDLCALC 119 (H) 11/22/2017   Lab Results  Component Value Date   TSH 2.678 01/02/2020    Therapeutic Level Labs: No results found for: LITHIUM Lab Results  Component Value Date   VALPROATE 67 11/17/2022   VALPROATE 90 10/16/2021   Lab Results  Component Value Date   CBMZ 7.8 11/17/2022   CBMZ 7.4 10/16/2021    Current Medications: Current Outpatient Medications  Medication Sig Dispense Refill   Acetaminophen  500 MG capsule Take 1,000 mg by mouth in the morning, at noon, and at bedtime. tid     aspirin  81 MG chewable tablet Chew 81 mg by mouth daily with breakfast.     Calcium  Citrate-Vitamin D  200-250 MG-UNIT TABS Take 1 tablet by mouth daily  after breakfast.      carbamazepine  (TEGRETOL  XR) 200 MG 12 hr tablet Take 2 tablets (400 mg total) by mouth 2 (two) times daily. 360 tablet  3   divalproex  (DEPAKOTE  ER) 500 MG 24 hr tablet TAKE ONE TABLET BY MOUTH TWICE A DAY 180 tablet 1   ezetimibe (ZETIA) 10 MG tablet Take 1 tablet by mouth daily.     fluticasone  (FLONASE ) 50 MCG/ACT nasal spray      folic acid  (FOLVITE ) 1 MG tablet Take 2 mg by mouth daily with breakfast.      gabapentin  (NEURONTIN ) 300 MG capsule TAKE 1 CAPSULE BY MOUTH TWICE DAILY 180 capsule 3   hydroxychloroquine (PLAQUENIL) 200 MG tablet Take 200 mg by mouth daily.     hypromellose (GENTEAL) 0.3 % GEL ophthalmic ointment Apply to eye.     ibandronate  (BONIVA ) 150 MG tablet every 30 (thirty) days.     levothyroxine  (SYNTHROID , LEVOTHROID) 75 MCG tablet Take 75 mcg by mouth daily before breakfast.      methotrexate  2.5 MG tablet Take 15 mg by mouth every Wednesday. WEDNESDAYS AT LUNCH- takes differently     mometasone  (ELOCON ) 0.1 % cream Apply topically.     Multiple Vitamins-Minerals (MULTIVITAMIN WITH MINERALS) tablet Take 1 tablet by mouth daily with breakfast.      naproxen  sodium (ALEVE ) 220 MG tablet Take 220 mg by mouth daily as needed.     nitroGLYCERIN  (NITROSTAT ) 0.4 MG SL tablet Place under the tongue.     pravastatin  (PRAVACHOL ) 80 MG tablet Take 1 tablet by mouth at bedtime.     Propylene Glycol 0.6 % SOLN Apply to eye. Takes 4 times a day, systane     QUEtiapine  (SEROQUEL  XR) 300 MG 24 hr tablet Take 1 tablet (300 mg total) by mouth at bedtime. 90 tablet 1   temazepam  (RESTORIL ) 7.5 MG capsule Take 1 capsule (7.5 mg total) by mouth at bedtime as needed for sleep. 30 capsule 5   tolterodine  (DETROL  LA) 4 MG 24 hr capsule Take 1 capsule (4 mg total) by mouth daily. 90 capsule 3   QUEtiapine  (SEROQUEL  XR) 50 MG TB24 24 hr tablet Take 1 tablet (50 mg total) by mouth at bedtime. Take along with 300 mg 90 tablet 1   saccharomyces boulardii (FLORASTOR) 250 MG  capsule Take 250 mg by mouth.     No current facility-administered medications for this visit.     Musculoskeletal: Strength & Muscle Tone: within normal limits Gait & Station: walks with a cane Patient leans: N/A  Psychiatric Specialty Exam: Review of Systems  Psychiatric/Behavioral: Negative.      Blood pressure 116/78, pulse 83, temperature 98 F (36.7 C), temperature source Temporal, height 4' 9 (1.448 m), weight 98 lb (44.5 kg), SpO2 93%.Body mass index is 21.21 kg/m.  General Appearance: Casual  Eye Contact:  Fair  Speech:  Clear and Coherent  Volume:  Normal  Mood:  Euthymic  Affect:  Congruent  Thought Process:  Goal Directed and Descriptions of Associations: Intact  Orientation:  Full (Time, Place, and Person)  Thought Content: Logical   Suicidal Thoughts:  No  Homicidal Thoughts:  No  Memory:  Immediate;   Fair Recent;   Fair Remote;   Fair  Judgement:  Fair  Insight:  Fair  Psychomotor Activity:  Normal  Concentration:  Concentration: Fair and Attention Span: Fair  Recall:  [employer] of Knowledge: Fair  Language: Fair  Akathisia:  No  Handed:  Right  AIMS (if indicated): done  Assets:  Communication Skills Desire for Improvement Housing Social Support Transportation  ADL's:  Intact  Cognition: WNL  Sleep:  Fair   Screenings:  AIMS    Flowsheet Row Office Visit from 09/16/2023 in [facility] Office Visit from 05/27/2023 in [facility] Office Visit from 11/12/2022 in [facility] Office Visit from 06/29/2022 in [facility] Office Visit from 04/29/2022 in [facility]  AIMS Total Score 0 0 0 0 1   AUDIT    Flowsheet Row Admission (Discharged) from 01/02/2020 in [facility]  Alcohol Use Disorder Identification Test Final Score  (AUDIT) 0   GAD-7    Flowsheet Row Office Visit from 09/16/2023 in [facility] Office Visit from 05/27/2023 in [facility] Office Visit from 04/29/2022 in [facility] Office Visit from 01/26/2022 in [facility] Office Visit from 11/26/2021 in [facility]  Total GAD-7 Score 5 11 7 4 9    PHQ2-9    Flowsheet Row Office Visit from 09/16/2023 in [facility] Office Visit from 06/09/2023 in [facility] Office Visit from 05/27/2023 in [facility] Office Visit from 06/29/2022 in [facility] Office Visit from 04/29/2022 in [facility]  PHQ-2 Total Score 0 2 4 0 3  PHQ-9 Total Score -- 8 16 5 9    Flowsheet Row Office Visit from 09/16/2023 in [facility] Office Visit from 06/09/2023 in [facility] Office Visit from 05/27/2023 in [facility]  C-SSRS RISK CATEGORY No Risk No Risk Low Risk     Assessment and Plan: KIYANA VAZGUEZ is a 78 year old Caucasian female, divorced, lives in [place] was evaluated in office today.  Discussed assessment and plan as noted below.  Bipolar disorder type I moderate in remission GAD-stable Insomnia-stable Currently reports mood symptoms are stable.  She does have falls due to balance issues.  She is not interested in reducing the dosage of any of her medications although she is on polypharmacy and medications could also increase the risk of falls.  She does have caregiver at home and uses a walker/cane and would like to stay on the current medication regimen.   Anxiety and sleep problems overall good. Continue Seroquel  350 mg daily Continue Tegretol  extended release 400 mg twice daily ( level dated 11/17/2022- 7.8) Continue Depakote  extended release 500 mg twice daily ( level 11/17/22-67) Continue Gabapentin  300 mg twice daily Continue Temazepam  7.5 mg at bedtime as needed for sleep. Continue psychotherapy sessions. Reviewed [place] PMP AWARxE  Encouraged to continue to follow up with primary care provider for recent falls.  We will consider repeating her medication levels and other labs when she returns.  Follow-up Follow-up in clinic in 8 to 10 weeks or sooner if needed.   Collaboration of Care: Collaboration of Care: Referral or follow-up with counselor/therapist AEB encouraged to continue CBT, Will coordinate care with Ms. Ellouise Hummer.  Patient/Guardian was advised Release of Information must be obtained prior to any record release in order to collaborate their care with an outside provider. Patient/Guardian was advised if they have not already done so to contact the registration department to sign all necessary forms in order for us  to release information regarding their care.   Consent: Patient/Guardian gives verbal consent for treatment and assignment of benefits  for services provided during this visit. Patient/Guardian expressed understanding and agreed to proceed.  This note was generated in part or whole with voice recognition software. Voice recognition is usually quite accurate but there are transcription errors that can and very often do occur. I apologize for any typographical errors that were not detected and corrected.     Archana Eckman, MD 09/17/2023, 7:48 AM

## 2023-11-23 ENCOUNTER — Telehealth: Payer: Self-pay

## 2023-11-23 ENCOUNTER — Telehealth: Payer: Self-pay | Admitting: Psychiatry

## 2023-11-23 NOTE — Telephone Encounter (Signed)
 Contacted patient after receiving a message from daughter that patient is not doing well.  I have also coordinated care with Ms. Ellouise Hummer therapist who reported patient may have had manic/hypomanic symptoms recently.  Likely triggered by situational anxiety.  As per daughter who left a message with nurse patient likely going through depression now. Communicated with staff to schedule this patient for an appointment tomorrow at 11:30 AM.  Communicated with patient as well as caregiver Jon who agrees to come in for the appointment tomorrow at 11:30.

## 2023-11-23 NOTE — Telephone Encounter (Signed)
 Medication management - Call with patient's daughter, after she left a message requesting a call back from our office to express concerns she did not feel her mother was doing as well. Collateral reported she was not aware of pt's appt for 11/25/23 having to be rescheduled and stated she felt patient should be seen as soon as possible.  Collateral reported she felt pt was starting to come down from a manic high phase and was not sleeping but now concerned she is going more into depression.  Collateral requested our office reach out to patient to reschedule for first available with Dr. Coby since pt's appointment from 11/25/23 had to be cancelled and needed to be rescheduled.  Collateral stated our office may need to be persistent to get her mother to come in due to her reported current mental status.

## 2023-11-24 ENCOUNTER — Ambulatory Visit (INDEPENDENT_AMBULATORY_CARE_PROVIDER_SITE_OTHER): Admitting: Psychiatry

## 2023-11-24 ENCOUNTER — Other Ambulatory Visit: Payer: Self-pay

## 2023-11-24 ENCOUNTER — Encounter: Payer: Self-pay | Admitting: Psychiatry

## 2023-11-24 VITALS — BP 139/83 | HR 69 | Temp 97.0°F | Ht <= 58 in | Wt 98.8 lb

## 2023-11-24 DIAGNOSIS — F3162 Bipolar disorder, current episode mixed, moderate: Secondary | ICD-10-CM | POA: Diagnosis not present

## 2023-11-24 DIAGNOSIS — F411 Generalized anxiety disorder: Secondary | ICD-10-CM | POA: Diagnosis not present

## 2023-11-24 DIAGNOSIS — Z79899 Other long term (current) drug therapy: Secondary | ICD-10-CM

## 2023-11-24 DIAGNOSIS — F5101 Primary insomnia: Secondary | ICD-10-CM | POA: Diagnosis not present

## 2023-11-24 DIAGNOSIS — G2401 Drug induced subacute dyskinesia: Secondary | ICD-10-CM

## 2023-11-24 MED ORDER — QUETIAPINE FUMARATE ER 400 MG PO TB24: 400.0000 mg | ORAL_TABLET | Freq: Every day | ORAL | 0 refills | Status: DC

## 2023-11-24 MED ORDER — CARBAMAZEPINE ER 200 MG PO TB12
200.0000 mg | ORAL_TABLET | Freq: Two times a day (BID) | ORAL | 0 refills | Status: AC
Start: 2023-11-24 — End: ?

## 2023-11-24 NOTE — Progress Notes (Signed)
 BH MD OP Progress Note  11/24/2023 1:34 PM Stephanie [name]  MRN:  [id number]  Chief Complaint:  Chief Complaint  Patient presents with   Follow-up   Depression   Anxiety   Medication Refill   Discussed the use of AI scribe software for clinical note transcription with the patient, who gave verbal consent to proceed.  History of Present Illness Stephanie [name] is a 78 year old Caucasian female, divorced, lives in [place], has a history of bipolar disorder, GAD, primary insomnia, memory loss, hypothyroidism, coronary artery disease, IBS, hyperlipidemia, fibromyalgia, GERD was evaluated in office today for a follow-up appointment.  Current struggles with mood instability include her description of talking fast, increased activity, and receiving feedback from caregivers to slow down. Her adult children and therapist have expressed concern about possible manic symptoms. She acknowledges a history of mania and states that her son has recently noticed signs of mania, while her daughter has expressed concern about possible depression and coming down from a manic episode recently. She describes feeling 'some depressed' and anxious when considering changes to her living situation, such as the possibility of moving to assisted living and selling her home of over 40 years.  Significant sleep disturbance affects her, and she states she is not sleeping well. She uses temazepam  for sleep some nights.  She notes that temazepam  helps her sleep better but often increases her dizziness the following morning.  Her current medication regimen includes Seroquel  (total 350 mg at bedtime), Tegretol  (carbamazepine  200 mg, 2 tablets twice daily), Depakote  (500 mg twice daily), gabapentin  (300 mg twice daily), and temazepam  7.5 mg as needed for sleep. She has taken Tegretol , Depakote , and gabapentin  for a long time. She expresses concern about medication side effects, particularly dizziness, and questions whether her  medications could be reduced.   Ongoing psychosocial stressors include major life decisions, such as whether to sell or rent her home and move to assisted living. She expresses ambivalence and anxiety about making a rash decision and recognizes her children's concerns about her judgment during periods of mood instability. She reports working with a Glass blower/designer and seeking input from her children. She describes utilizing physical therapy to address mobility and safety concerns.  She appeared to be alert, oriented to person place time situation.  3 word memory immediate 3 out of 3, after 5 minutes 2 out of 3.  She was able to do calculations well, attention and focus seem to be good.  She denies any current suicidality, homicidality or perceptual disturbances.  She uses a cane and also has a walker at home to prevent falls and to assist with her mobility.  She continues to have caregivers at home who comes Monday through Friday.  Initially during the session she was noted as having elevated blood pressure reading however on repeat her blood pressure is trending down.  She continues to follow-up with Ms.Ellouise Hummer her therapist.   Visit Diagnosis:    ICD-10-CM   1. Bipolar 1 disorder, mixed, moderate (HCC)  F31.62 carbamazepine  (TEGRETOL  XR) 200 MG 12 hr tablet    QUEtiapine  (SEROQUEL  XR) 400 MG 24 hr tablet    Valproic acid  level    Carbamazepine  level, total    2. GAD (generalized anxiety disorder)  F41.1 carbamazepine  (TEGRETOL  XR) 200 MG 12 hr tablet    3. Primary insomnia  F51.01 QUEtiapine  (SEROQUEL  XR) 400 MG 24 hr tablet    4. Tardive dyskinesia  G24.01     5. High  risk medication use  Z79.899 Prolactin    Hemoglobin A1c    Valproic acid  level    Carbamazepine  level, total      Past Psychiatric History: I have reviewed past psychiatric history from progress note on 06/17/2017.  Inpatient mental health admission at [facility]-01/02/2020 -  01/11/2020.  Past Medical History:  Past Medical History:  Diagnosis Date   Anginal pain (HCC)    Anxiety    Bipolar 1 disorder (HCC)    Bipolar disorder (HCC)    Coronary artery disease    Depression    Fatigue    Fibromyalgia    GERD (gastroesophageal reflux disease)    Heart disease    Heart failure (HCC)    HLD (hyperlipidemia)    HOH (hard of hearing)    Hypothyroidism    IBS (irritable bowel syndrome)    Pelvic pain in female    Perianal lesion    high grade sil lesion- keratinizing type   RA (rheumatoid arthritis) (HCC)    Raynaud phenomenon    Sjogren's disease (HCC)    Sleep apnea    Vaginal Pap smear, abnormal     Past Surgical History:  Procedure Laterality Date   CATARACT EXTRACTION W/PHACO Left 02/23/2017   Procedure: CATARACT EXTRACTION PHACO AND INTRAOCULAR LENS PLACEMENT (IOC);  Surgeon: Jaye Fallow, MD;  Location: [facility] ORS;  Service: Ophthalmology;  Laterality: Left;  US  00:35 AP% 9.6 CDE 3.47 Fluid pack lot # [id number]   CATARACT EXTRACTION W/PHACO Right 03/30/2017   Procedure: CATARACT EXTRACTION PHACO AND INTRAOCULAR LENS PLACEMENT (IOC);  Surgeon: Jaye Fallow, MD;  Location: [facility] ORS;  Service: Ophthalmology;  Laterality: Right;  US  00:32.6 AP% 13.8 CDE 4.51 Fluid Pack Lot # [id number]   EYE SURGERY     HYSTEROSCOPY WITH D & C N/A 12/22/2021   Procedure: DILATATION AND CURETTAGE /HYSTEROSCOPY;  Surgeon: Connell Davies, MD;  Location: [facility] ORS;  Service: Gynecology;  Laterality: N/A;   TONSILLECTOMY      Family Psychiatric History: I have reviewed family psychiatric history from progress note on 06/17/2017.  Family History:  Family History  Problem Relation Age of Onset   Heart failure Father    Parkinson's disease Mother    Bipolar disorder Mother    Depression Sister    Schizophrenia Paternal Grandmother    Diabetes Maternal Grandmother    Pancreatic cancer Maternal Grandmother    Cancer Neg Hx    Heart disease Neg Hx    Breast  cancer Neg Hx     Social History: I have reviewed social history from progress note on 06/17/2017. Social History   Socioeconomic History   Marital status: Divorced    Spouse name: Not on file   Number of children: 2   Years of education: Not on file   Highest education level: Master's degree (e.g., MA, MS, MEng, MEd, MSW, MBA)  Occupational History    Comment: retired2  Tobacco Use   Smoking status: Never   Smokeless tobacco: Former  Building services engineer status: Never Used  Substance and Sexual Activity   Alcohol use: No    Alcohol/week: 0.0 standard drinks of alcohol   Drug use: No   Sexual activity: Never    Birth control/protection: Post-menopausal  Other Topics Concern   Not on file  Social History Narrative   Lives alone   Social Drivers of Health   Financial Resource Strain: Patient Declined (02/02/2023)   Received from [facility]  Overall Financial Resource Strain (CARDIA)    Difficulty of Paying Living Expenses: Patient declined  Food Insecurity: Patient Declined (02/02/2023)   Received from [facility]   Hunger Vital Sign    Within the past 12 months, you worried that your food would run out before you got the money to buy more.: Patient declined    Within the past 12 months, the food you bought just didn't last and you didn't have money to get more.: Patient declined  Transportation Needs: Patient Declined (02/02/2023)   Received from [facility] - Transportation    In the past 12 months, has lack of transportation kept you from medical appointments or from getting medications?: Patient declined    Lack of Transportation (Non-Medical): Patient declined  Physical Activity: Inactive (01/26/2017)   Exercise Vital Sign    Days of Exercise per Week: 0 days    Minutes of Exercise per Session: 0 min  Stress: No Stress Concern Present (01/26/2017)   [employer] of Occupational Health -  Occupational Stress Questionnaire    Feeling of Stress : Only a little  Social Connections: Unknown (01/26/2017)   Social Connection and Isolation Panel    Frequency of Communication with Friends and Family: Not on file    Frequency of Social Gatherings with Friends and Family: Not on file    Attends Religious Services: Never    Active Member of Clubs or Organizations: No    Attends Banker Meetings: Never    Marital Status: Divorced    Allergies:  Allergies  Allergen Reactions   Amoxicillin-Pot Clavulanate Nausea Only and Other (See Comments)    Diarrhea Has patient had a PCN reaction causing immediate rash, facial/tongue/throat swelling, SOB or lightheadedness with hypotension:  Has patient had a PCN reaction causing severe rash involving mucus membranes or skin necrosis: Has patient had a PCN reaction that required hospitalization:  Has patient had a PCN reaction occurring within the last 10 years: If all of the above answers are NO, then may proceed with Cephalosporin use.    Oxycodone-Acetaminophen  Nausea Only   Erythromycin Nausea Only   Azithromycin Nausea And Vomiting   Codeine    Macrolides And Ketolides    Oxycodone-Acetaminophen     Penicillins Other (See Comments)    Has patient had a PCN reaction causing immediate rash, facial/tongue/throat swelling, SOB or lightheadedness with hypotension: NO Has patient had a PCN reaction causing severe rash involving mucus membranes or skin necrosis: NO Has patient had a PCN reaction that required hospitalization: NO Has patient had a PCN reaction occurring within the last 10 years: NO If all of the above answers are NO, then may proceed with Cephalosporin use.    Remeron  [Mirtazapine ]     Side effect-likely tardive dyskinesia   Tramadol Nausea And Vomiting   Zolpidem      Sleep walking   Lamotrigine Rash    LAMICTAL   Latex Rash    Latex bandages   Sulfa Antibiotics Rash    Metabolic Disorder Labs: Lab  Results  Component Value Date   HGBA1C 5.3 11/22/2017   Lab Results  Component Value Date   PROLACTIN 33.0 (H) 11/22/2017   Lab Results  Component Value Date   CHOL 222 (H) 01/02/2020   TRIG 190 (H) 01/02/2020   HDL 69 01/02/2020   CHOLHDL 3.2 01/02/2020   VLDL 38 01/02/2020   LDLCALC 115 (H) 01/02/2020   LDLCALC 119 (H) 11/22/2017   Lab Results  Component Value Date   TSH 2.678 01/02/2020    Therapeutic Level Labs: No results found for: LITHIUM Lab Results  Component Value Date   VALPROATE 67 11/17/2022   VALPROATE 90 10/16/2021   Lab Results  Component Value Date   CBMZ 7.8 11/17/2022   CBMZ 7.4 10/16/2021    Current Medications: Current Outpatient Medications  Medication Sig Dispense Refill   QUEtiapine  (SEROQUEL  XR) 400 MG 24 hr tablet Take 1 tablet (400 mg total) by mouth at bedtime. Stop the 300 mg and 50 mg 90 tablet 0   Acetaminophen  500 MG capsule Take 1,000 mg by mouth in the morning, at noon, and at bedtime. tid     aspirin  81 MG chewable tablet Chew 81 mg by mouth daily with breakfast.     Calcium  Citrate-Vitamin D  200-250 MG-UNIT TABS Take 1 tablet by mouth daily after breakfast.      carbamazepine  (TEGRETOL  XR) 200 MG 12 hr tablet Take 1 tablet (200 mg total) by mouth 2 (two) times daily. Dose change 180 tablet 0   divalproex  (DEPAKOTE  ER) 500 MG 24 hr tablet TAKE ONE TABLET BY MOUTH TWICE A DAY 180 tablet 1   ezetimibe (ZETIA) 10 MG tablet Take 1 tablet by mouth daily.     fluticasone  (FLONASE ) 50 MCG/ACT nasal spray      folic acid  (FOLVITE ) 1 MG tablet Take 2 mg by mouth daily with breakfast.      gabapentin  (NEURONTIN ) 300 MG capsule TAKE 1 CAPSULE BY MOUTH TWICE DAILY 180 capsule 3   hydroxychloroquine (PLAQUENIL) 200 MG tablet Take 200 mg by mouth daily.     hypromellose (GENTEAL) 0.3 % GEL ophthalmic ointment Apply to eye.     ibandronate  (BONIVA ) 150 MG tablet every 30 (thirty) days.     levothyroxine  (SYNTHROID , LEVOTHROID) 75 MCG tablet  Take 75 mcg by mouth daily before breakfast.      methotrexate  2.5 MG tablet Take 15 mg by mouth every Wednesday. WEDNESDAYS AT LUNCH- takes differently     mometasone  (ELOCON ) 0.1 % cream Apply topically.     Multiple Vitamins-Minerals (MULTIVITAMIN WITH MINERALS) tablet Take 1 tablet by mouth daily with breakfast.      naproxen  sodium (ALEVE ) 220 MG tablet Take 220 mg by mouth daily as needed.     nitroGLYCERIN  (NITROSTAT ) 0.4 MG SL tablet Place under the tongue.     pravastatin  (PRAVACHOL ) 80 MG tablet Take 1 tablet by mouth at bedtime.     Propylene Glycol 0.6 % SOLN Apply to eye. Takes 4 times a day, systane     saccharomyces boulardii (FLORASTOR) 250 MG capsule Take 250 mg by mouth.     temazepam  (RESTORIL ) 7.5 MG capsule Take 1 capsule (7.5 mg total) by mouth at bedtime as needed for sleep. 30 capsule 5   tolterodine  (DETROL  LA) 4 MG 24 hr capsule Take 1 capsule (4 mg total) by mouth daily. 90 capsule 3   No current facility-administered medications for this visit.     Musculoskeletal: Strength & Muscle Tone: within normal limits Gait & Station: walks with cane  Patient leans: N/A  Psychiatric Specialty Exam: Review of Systems  Psychiatric/Behavioral:  Positive for dysphoric mood and sleep disturbance. The patient is nervous/anxious.        Manic symptoms    Blood pressure 139/83, pulse 69, temperature (!) 97 F (36.1 C), temperature source Temporal, height 4' 9 (1.448 m), weight 98 lb 12.8 oz (44.8 kg).Body mass index is 21.38 kg/m.  General  Appearance: Casual  Eye Contact:  Fair  Speech:  Clear and Coherent  Volume:  Normal  Mood:  Anxious and Depressed,recent manic symptoms  Affect:  Congruent  Thought Process:  Goal Directed and Descriptions of Associations: Circumstantial  Orientation:  Full (Time, Place, and Person)  Thought Content: Rumination   Suicidal Thoughts:  No  Homicidal Thoughts:  No  Memory:  Immediate;   Fair Recent;   Fair Remote;   Limited   Judgement:  Fair  Insight:  Fair  Psychomotor Activity:  Normal  Concentration:  Concentration: Fair and Attention Span: Fair  Recall:  [employer] of Knowledge: Fair  Language: Fair  Akathisia:  No  Handed:  Right  AIMS (if indicated): done  Assets:  Communication Skills Desire for Improvement Housing Social Support Transportation  ADL's:  Intact  Cognition: WNL  Sleep:  ok as long as she takes Temazepam    Screenings: Geneticist, molecular Office Visit from 11/24/2023 in [name] Office Visit from 09/16/2023 in [name] Office Visit from 05/27/2023 in [name] Office Visit from 11/12/2022 in [name] Office Visit from 06/29/2022 in [name]  AIMS Total Score 0 0 0 0 0   AUDIT    Flowsheet Row Admission (Discharged) from 01/02/2020 in [facility] INPATIENT BEHAVIORAL MEDICINE  Alcohol Use Disorder Identification Test Final Score (AUDIT) 0   GAD-7    Flowsheet Row Office Visit from 09/16/2023 in [name] Office Visit from 05/27/2023 in [name] Office Visit from 04/29/2022 in [name] Office Visit from 01/26/2022 in [name] Office Visit from 11/26/2021 in [name]  Total GAD-7 Score 5 11 7 4 9    PHQ2-9    Flowsheet Row Office Visit from 09/16/2023 in [name] Office Visit from 06/09/2023 in [name] Office Visit from 05/27/2023 in [name] Office Visit from 06/29/2022 in [name] Office Visit  from 04/29/2022 in [name]  PHQ-2 Total Score 0 2 4 0 3  PHQ-9 Total Score -- 8 16 5 9    Flowsheet Row Office Visit from 11/24/2023 in [name] Office Visit from 09/16/2023 in [name] Office Visit from 06/09/2023 in [name]  C-SSRS RISK CATEGORY No Risk No Risk No Risk     Assessment and Plan: GUSTA MARKSBERRY 78 year old Caucasian female who presents for a follow-up appointment.  Discussed assessment and plan as noted below.  1. Bipolar 1 disorder, mixed, moderate (HCC)-unstable Recently with manic symptoms, racing thoughts, impulsivity, restlessness and currently with possible anxiety and depression symptoms mostly about situational stressors including her limited mobility, her housing concerns.  She is on multiple mood stabilizers and has been taking them for a very long time.  Also worried about the effect of these medications including dizziness with which she struggles with on a daily basis.  Agreeable to reduce the dosage of carbamazepine  while uptitrating the dose of Seroquel  to address her mood and sleep with caution regarding worsening dizziness drowsiness, orthostatic hypotension with the Seroquel . Increase Seroquel  XR to 400 mg daily to address current depression/mixed symptoms. Since Tegretol   is being reduced if patient does have excessive sedation or dizziness from Seroquel  being uptitrated will consider reducing the Seroquel  back to a 300 or 350 milligram due to drug to drug interaction between Seroquel  and Tegretol . Reduce Tegretol  extended release to 200 mg twice daily (level dated 11/17/2022-7.8) Continue Depakote  extended release 500 mg twice daily(level dated 11/17/18 24-67) Continue Gabapentin  300 mg twice daily  2. GAD (generalized anxiety disorder)-unstable Currently with worsening anxiety due to her  concerns about selling her home, moving to assisted living given her physical limitations.  She currently does have a therapist and has been compliant with psychotherapy sessions. Increase Seroquel  XR to 400 mg daily (reducing the dosage of Seroquel  if excessive drowsiness/dizziness specifically due to drug to drug interaction between Tegretol  and the fact that Tegretol  dose is being reduced.) Continue Gabapentin  300 mg twice daily Continue psychotherapy sessions with Ms. Ellouise Hummer , I have coordinated care.  3. Primary insomnia-unstable Recent sleep problems likely multifactorial including worsening anxiety and recent mania/mixed symptoms. Increase Seroquel  to address sleep. Continue Temazepam  7.5 mg at bedtime as needed ,however hold if excessive sleepiness or dizziness. Reviewed [place] PMP AWARxE  4. Tardive dyskinesia-per history Denies any concerns about it at this visit.  5. High risk medication use Will order Depakote  level, carbamazepine  level.  Also ordered hemoglobin A1c and prolactin level. Patient advised to go to [facility] 5 days after starting the dose change of carbamazepine . Reviewed and discussed LFT-10/20/2023-normal range. Creatinine/GFR-10/20/2023-acceptable range CBC with differential-platelet count-150, acceptable range MCV elevated at 107.2, MCH elevated at 35.4-patient to continue to follow-up with her primary provider for lab changes.  Lipid panel-within normal limits.  Dated 08/03/2023. TSH-1.497-dated 08/03/2023-within normal limits.  I have also coordinated care with caregiver who was present in session today.  Discussed with caregiver regarding concerns/risk of falls with her current psychotropic medications and the fact that her Seroquel  is being uptitrated.  Patient advised to use a walker or a cane and to make use of support system at home.  Patient advised to let this provider know if any increased dizziness or falls with the medications or go to the nearest  emergency department if with worsening symptoms of mood or physical complaints of dizziness.  Collaboration of Care: Collaboration of Care: Other I have coordinated care with Ms. Ellouise Hummer discussed treatment plan.  Attempted to contact daughter during the session with patient on speaker phone since daughter had contacted the office raising concerns about recent mood changes.  While in session I contacted the pharmacy spoke to pharmacist Rosina regarding medication changes being made and to discontinue previous medication dosages of carbamazepine  and Seroquel .  Patient currently receives bubble packs.  Patient/Guardian was advised Release of Information must be obtained prior to any record release in order to collaborate their care with an outside provider. Patient/Guardian was advised if they have not already done so to contact the registration department to sign all necessary forms in order for us  to release information regarding their care.   Consent: Patient/Guardian gives verbal consent for treatment and assignment of benefits for services provided during this visit. Patient/Guardian expressed understanding and agreed to proceed.  I have spent atleast 40 minutes face to face with patient today which includes the time spent for preparing to see the patient ( e.g., review of test, records ), obtaining and to review and separately obtained history , ordering medications and test ,psychoeducation and supportive psychotherapy and care coordination,as well as documenting clinical information in electronic health record,interpreting and  communication of test results.   Khi Mcmillen, MD 11/26/2023, 8:03 AM

## 2023-11-25 ENCOUNTER — Ambulatory Visit: Admitting: Psychiatry

## 2023-11-29 ENCOUNTER — Telehealth: Payer: Self-pay

## 2023-11-29 NOTE — Telephone Encounter (Signed)
 Medication management - Telephone call with pt., after she left a message requesting a call back to verify new Tegretol  XR and Seroquel  orders. Reviewed Dr.Eappen's most recent visit with decrease in Carbamazepine  (Tegretol  XR) to 1 tablet twice a day, down from 2 tablets twice a day and increase in Seroquel  to 400 mg at bedtime, up from 350 mg.  Reviewed this several times with patient, as she wrote these instructions down and repeated the changes back to this RN.  Patient stated she needed to wait 5 days and then go for ordered labwork as Dr. Coby had instructed.  Patient to call back if any issues with change in dosages or of any other questions.

## 2023-11-29 NOTE — Telephone Encounter (Signed)
Noted and Thank you 

## 2023-11-30 ENCOUNTER — Telehealth: Payer: Self-pay

## 2023-11-30 NOTE — Telephone Encounter (Signed)
 Patient called triage line asking about a urine hat to collect a urine sample on her appointment 10/01. Returned call to patient to make patient aware that we do not use hats in the office due to possible contamination and inaccurate results on urinalysis. Patient states she does not understand why a hat can not be used and her primary care physician uses them. I responded informing her that all practices do not follow same protocol and with us  being urology we recommend clean catch urine samples. Patient then stated her impediment is that she has Rheumatoid Arthritis and can not be certain that she can catch the urine in a small cup mid stream. Advised the patient that if a urine is needed on her upcoming visit Stephanie [name] may get a Cath UA. Patient immediately denied and stated catheters hurt and she's 78 years old and refuses to go through that pain. I voiced understanding and made her aware and ensured her if a urine sample is needed assistance will be provided. Patient voiced understanding.

## 2023-12-02 NOTE — Telephone Encounter (Signed)
 Pt called back and wanted to let Clotilda know that she just had labs done on 9/16. And that she will not do a Cath Ua if we need a urine sample.

## 2023-12-06 ENCOUNTER — Emergency Department
Admission: EM | Admit: 2023-12-06 | Discharge: 2023-12-06 | Disposition: A | Discharge status: Home / Self Care | Attending: Emergency Medicine | Admitting: Emergency Medicine

## 2023-12-06 ENCOUNTER — Other Ambulatory Visit: Payer: Self-pay

## 2023-12-06 ENCOUNTER — Emergency Department

## 2023-12-06 DIAGNOSIS — I2510 Atherosclerotic heart disease of native coronary artery without angina pectoris: Secondary | ICD-10-CM | POA: Diagnosis not present

## 2023-12-06 DIAGNOSIS — R079 Chest pain, unspecified: Secondary | ICD-10-CM | POA: Insufficient documentation

## 2023-12-06 LAB — HEPATIC FUNCTION PANEL
ALT: 18 U/L (ref 0–44)
AST: 29 U/L (ref 15–41)
Albumin: 3.7 g/dL (ref 3.5–5.0)
Alkaline Phosphatase: 43 U/L (ref 38–126)
Bilirubin, Direct: 0.1 mg/dL (ref 0.0–0.2)
Indirect Bilirubin: 0.5 mg/dL (ref 0.3–0.9)
Total Bilirubin: 0.6 mg/dL (ref 0.0–1.2)
Total Protein: 6.2 g/dL — ABNORMAL LOW (ref 6.5–8.1)

## 2023-12-06 LAB — CBC
HCT: 41.1 % (ref 36.0–46.0)
Hemoglobin: 13.5 g/dL (ref 12.0–15.0)
MCH: 34.8 pg — ABNORMAL HIGH (ref 26.0–34.0)
MCHC: 32.8 g/dL (ref 30.0–36.0)
MCV: 105.9 fL — ABNORMAL HIGH (ref 80.0–100.0)
Platelets: 141 K/uL — ABNORMAL LOW (ref 150–400)
RBC: 3.88 MIL/uL (ref 3.87–5.11)
RDW: 13.3 % (ref 11.5–15.5)
WBC: 4.7 K/uL (ref 4.0–10.5)
nRBC: 0 % (ref 0.0–0.2)

## 2023-12-06 LAB — BASIC METABOLIC PANEL WITH GFR
Anion gap: 10 (ref 5–15)
BUN: 18 mg/dL (ref 8–23)
CO2: 27 mmol/L (ref 22–32)
Calcium: 9.1 mg/dL (ref 8.9–10.3)
Chloride: 102 mmol/L (ref 98–111)
Creatinine, Ser: 0.6 mg/dL (ref 0.44–1.00)
GFR, Estimated: >60 mL/min (ref >60)
Glucose, Bld: 92 mg/dL (ref 70–99)
Potassium: 4.7 mmol/L (ref 3.5–5.1)
Sodium: 139 mmol/L (ref 135–145)

## 2023-12-06 LAB — TROPONIN I (HIGH SENSITIVITY)
Troponin I (High Sensitivity): 4 ng/L (ref <18)
Troponin I (High Sensitivity): 4 ng/L (ref <18)

## 2023-12-06 MED ORDER — OMEPRAZOLE MAGNESIUM 20 MG PO TBEC: 20.0000 mg | DELAYED_RELEASE_TABLET | Freq: Every day | ORAL | 0 refills | Status: DC

## 2023-12-06 MED ORDER — LIDOCAINE VISCOUS HCL 2 % MT SOLN
15.0000 mL | Freq: Once | OROMUCOSAL | Status: AC
Start: 2023-12-06 — End: 2023-12-06
  Administered 2023-12-06: 15 mL via ORAL
  Filled 2023-12-06: qty 15

## 2023-12-06 MED ORDER — ALUM & MAG HYDROXIDE-SIMETH 200-200-20 MG/5ML PO SUSP
30.0000 mL | Freq: Once | ORAL | Status: AC
Start: 2023-12-06 — End: 2023-12-06
  Administered 2023-12-06: 30 mL via ORAL
  Filled 2023-12-06: qty 30

## 2023-12-06 MED ORDER — FAMOTIDINE 20 MG PO TABS
20.0000 mg | ORAL_TABLET | Freq: Once | ORAL | Status: AC
  Administered 2023-12-06: 20 mg via ORAL
  Filled 2023-12-06: qty 1

## 2023-12-06 NOTE — ED Provider Notes (Signed)
 [facility] Provider Note    Event Date/Time   First MD Initiated Contact with Patient 12/06/23 1347     (approximate)   History   Chest Pain   HPI  Stephanie [name] is a 78 y.o. female past medical history significant for coronary artery disease, OSA, hyperlipidemia, who presents to the emergency department with an episode of chest pain.  States that she was in her kitchen this morning after she woke up and started having a severe chest pain that she described as a sharp stabbing pain to the middle of her chest and her abdomen.  States that he she took a nitroglycerin .  Since arriving to the emergency department her symptoms have significantly improved.  Denies any shortness of breath.  No nausea vomiting or diaphoresis.  Denies any exertional chest pain or change of position chest pain.  No history of DVT or PE.  Followed by cardiology and last had a catheterization in 2009.  No significant family history for coronary artery disease.  No tearing chest pain.     Physical Exam   Triage Vital Signs: ED Triage Vitals  Encounter Vitals Group     BP 12/06/23 1054 (!) 141/83     Girls Systolic BP Percentile --      Girls Diastolic BP Percentile --      Boys Systolic BP Percentile --      Boys Diastolic BP Percentile --      Pulse Rate 12/06/23 1054 66     Resp 12/06/23 1054 18     Temp 12/06/23 1054 98.2 F (36.8 C)     Temp src --      SpO2 12/06/23 1054 100 %     Weight 12/06/23 1053 98 lb (44.5 kg)     Height 12/06/23 1053 4' 11 (1.499 m)     Head Circumference --      Peak Flow --      Pain Score 12/06/23 1053 8     Pain Loc --      Pain Education --      Exclude from Growth Chart --     Most recent vital signs: Vitals:   12/06/23 1054 12/06/23 1356  BP: (!) 141/83   Pulse: 66   Resp: 18   Temp: 98.2 F (36.8 C)   SpO2: 100% 100%    Physical Exam Constitutional:      Appearance: She is well-developed.  HENT:     Head: Atraumatic.   Eyes:     Conjunctiva/sclera: Conjunctivae normal.  Cardiovascular:     Rate and Rhythm: Regular rhythm.     Heart sounds: Normal heart sounds.  Pulmonary:     Effort: No respiratory distress.  Abdominal:     General: There is no distension.     Palpations: Abdomen is soft.     Tenderness: There is no abdominal tenderness.  Musculoskeletal:        General: Normal range of motion.     Cervical back: Normal range of motion.     Right lower leg: No edema.     Left lower leg: No edema.  Skin:    General: Skin is warm.     Capillary Refill: Capillary refill takes less than 2 seconds.  Neurological:     General: No focal deficit present.     Mental Status: She is alert. Mental status is at baseline.     IMPRESSION / MDM / ASSESSMENT AND PLAN / ED COURSE  I reviewed the triage vital signs and the nursing notes.  Differential diagnosis including ACS, anemia, gastritis/PUD, symptomatic cholelithiasis, pancreatitis, pneumonia.  Have a low suspicion for pulmonary embolism, no shortness of breath, low risk Wells criteria, not pleuritic in nature and 100% on room air.  Low suspicion for dissection, no tearing chest pain, pulses are equal and symmetric   EKG  I, Clotilda Punter, the attending physician, personally viewed and interpreted this ECG.  EKG showed normal sinus rhythm.  With no significant change when compared to prior EKG.  No significant ST elevation or depression.  No significant findings concerning for acute ischemia or dysrhythmia  Repeat EKG obtained with no significant change from initial  No tachycardic or bradycardic dysrhythmias while on cardiac telemetry.  RADIOLOGY I independently reviewed imaging, my interpretation of imaging: Chest x-ray no signs of pneumonia no widened mediastinum, no pneumothorax or pulmonary edema  LABS (all labs ordered are listed, but only abnormal results are displayed) Labs interpreted as -    Labs Reviewed  CBC - Abnormal; Notable  for the following components:      Result Value   MCV 105.9 (*)    MCH 34.8 (*)    Platelets 141 (*)    All other components within normal limits  HEPATIC FUNCTION PANEL - Abnormal; Notable for the following components:   Total Protein 6.2 (*)    All other components within normal limits  BASIC METABOLIC PANEL WITH GFR  TROPONIN I (HIGH SENSITIVITY)  TROPONIN I (HIGH SENSITIVITY)     MDM  Patient given GI cocktail and famotidine   Lab work overall reassuring with no significant electrolyte abnormality.  Creatinine appears to be at baseline.  No significant leukocytosis and has normal LFTs.  Serial troponins are negative.  Heart score 4.  On reevaluation states that her symptoms have resolved and she is not having any pain at this time.  Most likely with gastritis/PUD.  Discussed close follow-up as an outpatient with her cardiologist.  Discussed return to the emergency department if she had any return or worsening symptoms.  Will start the patient on a PPI and discussed close follow-up with her primary care physician.  No questions at time of discharge.     PROCEDURES:  Critical Care performed: No  Procedures  Patient's presentation is most consistent with acute presentation with potential threat to life or bodily function.   MEDICATIONS ORDERED IN ED: Medications  famotidine  (PEPCID ) tablet 20 mg (20 mg Oral Given 12/06/23 1408)  alum & mag hydroxide-simeth (MAALOX/MYLANTA) 200-200-20 MG/5ML suspension 30 mL (30 mLs Oral Given 12/06/23 1408)    And  lidocaine  (XYLOCAINE ) 2 % viscous mouth solution 15 mL (15 mLs Oral Given 12/06/23 1408)    FINAL CLINICAL IMPRESSION(S) / ED DIAGNOSES   Final diagnoses:  Chest pain, unspecified type     Rx / DC Orders   ED Discharge Orders          Ordered    omeprazole (PRILOSEC OTC) 20 MG tablet  Daily        12/06/23 1519             Note:  This document was prepared using Dragon voice recognition software and may include  unintentional dictation errors.   Punter Clotilda, MD 12/06/23 [phone]

## 2023-12-06 NOTE — Discharge Instructions (Signed)
 You are seen in the emergency department for chest pain.  Your EKG was normal.  You had 2 heart enzymes (troponin) that were normal and do not believe you are having a heart attack today.  Your chest x-ray was normal and did not show any signs of pneumonia  You were given a acid reflux medication which improved your symptoms.  You were given a prescription to start an acid reflux medicine for the next 2 weeks to see if this improves your symptoms.  Call your cardiologist tomorrow to discuss close follow-up and let them know you were seen in the emergency department for chest pain.  Discussed whether you need further stress testing.  Is importantly return to the emergency department if your symptoms return.  You are given a prescription for omeprazole

## 2023-12-06 NOTE — ED Triage Notes (Signed)
 Pt comes mid sternal cp that started this morning. Pt states CAD. Pt states stabbing pain. Pt took 3 nitroglycerin  at intervals and now pain is 8.

## 2023-12-07 NOTE — Progress Notes (Unsigned)
 12/08/23 10:14 AM   Stephanie [name] Aug 01, 1945 [id number]  Referring provider:  Diedra Lame, MD [phone] [address] [facility] [place],  [place] [address]  Urological history  1. OAB -contributing factors of age, vaginal atrophy and Seroquel  -Myrbetriq  50 mg daily - cost prohibitive -Detrol  LA 4 mg daily    2. Urge incontinence -contributing factors of age, vaginal atrophy and Seroquel    3. Urethral caruncle -applying the vaginal estrogen cream three nights weekly   4. rUTI's -contributing factors of age, vaginal atrophy, incontinence, immunocompromised, family history (mother has a history of rUTI's) and IBS -contrast CT (09/2022) - NED  -documented urine cultures over the last year - June 01, 2023, no growth - April 13, 2023, E. Coli - December 31, 2022 E. coli   Chief Complaint  Patient presents with   OAB (overactive bladder)    HPI: Stephanie [name] is a 78 y.o. woman who presents today for yearly follow up with her caregiver, Jon.   Previous records reviewed.   She is having 1-7 daytime voids, 1-2 episodes of nocturia and a strong urge to urinate.  She has urge incontinence.  She leaks 3 or more times a day.  She wears 4 absorbent pads and 1 depends daily.  She sometimes limits fluid intake and she does engage in toilet mapping.  Patient denies any modifying or aggravating factors.  Patient denies any recent UTI's, gross hematuria, dysuria or suprapubic/flank pain.  Patient denies any fevers, chills, nausea or vomiting.    Serum creatinine (11/2023) 0.60, > 60 eGFR  UA (10/2023) negative   She is not having any untoward side effects of the Detrol  LA 4 mg daily.  She states she is satisfied with her urinary symptoms.  She has had 3 UTIs since I last saw her.  She is not using the vaginal estrogen cream as she wanted to decrease the amount of medication she was taking.  PMH: Past Medical History:  Diagnosis Date    Anginal pain    Anxiety    Bipolar 1 disorder (HCC)    Bipolar disorder (HCC)    Coronary artery disease    Depression    Fatigue    Fibromyalgia    GERD (gastroesophageal reflux disease)    Heart disease    Heart failure (HCC)    HLD (hyperlipidemia)    HOH (hard of hearing)    Hypothyroidism    IBS (irritable bowel syndrome)    Pelvic pain in female    Perianal lesion    high grade sil lesion- keratinizing type   RA (rheumatoid arthritis) (HCC)    Raynaud phenomenon    Sjogren's disease    Sleep apnea    Vaginal Pap smear, abnormal     Surgical History: Past Surgical History:  Procedure Laterality Date   CATARACT EXTRACTION W/PHACO Left 02/23/2017   Procedure: CATARACT EXTRACTION PHACO AND INTRAOCULAR LENS PLACEMENT (IOC);  Surgeon: Jaye Fallow, MD;  Location: [facility] ORS;  Service: Ophthalmology;  Laterality: Left;  US  00:35 AP% 9.6 CDE 3.47 Fluid pack lot # [id number]   CATARACT EXTRACTION W/PHACO Right 03/30/2017   Procedure: CATARACT EXTRACTION PHACO AND INTRAOCULAR LENS PLACEMENT (IOC);  Surgeon: Jaye Fallow, MD;  Location: [facility] ORS;  Service: Ophthalmology;  Laterality: Right;  US  00:32.6 AP% 13.8 CDE 4.51 Fluid Pack Lot # [id number]   EYE SURGERY     HYSTEROSCOPY WITH D & C N/A 12/22/2021   Procedure: DILATATION AND CURETTAGE /  HYSTEROSCOPY;  Surgeon: Connell Davies, MD;  Location: [facility] ORS;  Service: Gynecology;  Laterality: N/A;   TONSILLECTOMY      Home Medications:  Allergies as of 12/08/2023       Reactions   Amoxicillin-pot Clavulanate Nausea Only, Other (See Comments)   Diarrhea Has patient had a PCN reaction causing immediate rash, facial/tongue/throat swelling, SOB or lightheadedness with hypotension:  Has patient had a PCN reaction causing severe rash involving mucus membranes or skin necrosis: Has patient had a PCN reaction that required hospitalization:  Has patient had a PCN reaction occurring within the last 10 years: If all of the above  answers are NO, then may proceed with Cephalosporin use.   Oxycodone-acetaminophen  Nausea Only   Erythromycin Nausea Only   Azithromycin Nausea And Vomiting   Codeine    Macrolides And Ketolides    Oxycodone-acetaminophen     Penicillins Other (See Comments)   Has patient had a PCN reaction causing immediate rash, facial/tongue/throat swelling, SOB or lightheadedness with hypotension: NO Has patient had a PCN reaction causing severe rash involving mucus membranes or skin necrosis: NO Has patient had a PCN reaction that required hospitalization: NO Has patient had a PCN reaction occurring within the last 10 years: NO If all of the above answers are NO, then may proceed with Cephalosporin use.   Remeron  [mirtazapine ]    Side effect-likely tardive dyskinesia   Tramadol Nausea And Vomiting   Zolpidem     Sleep walking   Lamotrigine Rash   LAMICTAL   Latex Rash   Latex bandages   Sulfa Antibiotics Rash        Medication List        Accurate as of December 08, 2023 10:14 AM. If you have any questions, ask your nurse or doctor.          Acetaminophen  500 MG capsule Take 1,000 mg by mouth in the morning, at noon, and at bedtime. tid   aspirin  81 MG chewable tablet Chew 81 mg by mouth daily with breakfast.   Calcium  Citrate-Vitamin D  200-250 MG-UNIT Tabs Take 1 tablet by mouth daily after breakfast.   carbamazepine  200 MG 12 hr tablet Commonly known as: TEGRETOL  XR Take 1 tablet (200 mg total) by mouth 2 (two) times daily. Dose change   divalproex  500 MG 24 hr tablet Commonly known as: DEPAKOTE  ER TAKE ONE TABLET BY MOUTH TWICE A DAY   ezetimibe 10 MG tablet Commonly known as: ZETIA Take 1 tablet by mouth daily.   fluticasone  50 MCG/ACT nasal spray Commonly known as: FLONASE    folic acid  1 MG tablet Commonly known as: FOLVITE  Take 2 mg by mouth every morning. What changed: Another medication with the same name was removed. Continue taking this medication, and  follow the directions you see here. Changed by: Demetrias Goodbar   gabapentin  300 MG capsule Commonly known as: NEURONTIN  TAKE 1 CAPSULE BY MOUTH TWICE DAILY   hydroxychloroquine 200 MG tablet Commonly known as: PLAQUENIL Take 200 mg by mouth daily.   hypromellose 0.3 % Gel ophthalmic ointment Commonly known as: GENTEAL Apply to eye.   ibandronate  150 MG tablet Commonly known as: BONIVA  every 30 (thirty) days.   levothyroxine  75 MCG tablet Commonly known as: SYNTHROID  Take 75 mcg by mouth daily before breakfast.   methotrexate  2.5 MG tablet Commonly known as: RHEUMATREX Take 15 mg by mouth every Wednesday. WEDNESDAYS AT LUNCH- takes differently   mNexspike 10 MCG/0.2ML Susy Generic drug: COVID-19 mRNA Vacc (Moderna)   mometasone   0.1 % cream Commonly known as: ELOCON  Apply topically.   multivitamin with minerals tablet Take 1 tablet by mouth daily with breakfast.   naproxen  sodium 220 MG tablet Commonly known as: ALEVE  Take 220 mg by mouth daily as needed.   nitroGLYCERIN  0.4 MG SL tablet Commonly known as: NITROSTAT  Place under the tongue.   omeprazole 20 MG capsule Commonly known as: PRILOSEC Take 20 mg by mouth daily.   omeprazole 20 MG tablet Commonly known as: PriLOSEC OTC Take 1 tablet (20 mg total) by mouth daily for 14 days.   pravastatin  40 MG tablet Commonly known as: PRAVACHOL  Take by mouth. What changed: Another medication with the same name was removed. Continue taking this medication, and follow the directions you see here. Changed by: CLOTILDA CORNWALL   Propylene Glycol 0.6 % Soln Apply to eye. Takes 4 times a day, systane   QUEtiapine  400 MG 24 hr tablet Commonly known as: SEROquel  XR Take 1 tablet (400 mg total) by mouth at bedtime. Stop the 300 mg and 50 mg   saccharomyces boulardii 250 MG capsule Commonly known as: FLORASTOR Take 250 mg by mouth.   temazepam  7.5 MG capsule Commonly known as: RESTORIL  Take 1 capsule (7.5 mg total)  by mouth at bedtime as needed for sleep.   tolterodine  4 MG 24 hr capsule Commonly known as: DETROL  LA Take 1 capsule (4 mg total) by mouth daily.        Allergies:  Allergies  Allergen Reactions   Amoxicillin-Pot Clavulanate Nausea Only and Other (See Comments)    Diarrhea Has patient had a PCN reaction causing immediate rash, facial/tongue/throat swelling, SOB or lightheadedness with hypotension:  Has patient had a PCN reaction causing severe rash involving mucus membranes or skin necrosis: Has patient had a PCN reaction that required hospitalization:  Has patient had a PCN reaction occurring within the last 10 years: If all of the above answers are NO, then may proceed with Cephalosporin use.    Oxycodone-Acetaminophen  Nausea Only   Erythromycin Nausea Only   Azithromycin Nausea And Vomiting   Codeine    Macrolides And Ketolides    Oxycodone-Acetaminophen     Penicillins Other (See Comments)    Has patient had a PCN reaction causing immediate rash, facial/tongue/throat swelling, SOB or lightheadedness with hypotension: NO Has patient had a PCN reaction causing severe rash involving mucus membranes or skin necrosis: NO Has patient had a PCN reaction that required hospitalization: NO Has patient had a PCN reaction occurring within the last 10 years: NO If all of the above answers are NO, then may proceed with Cephalosporin use.    Remeron  [Mirtazapine ]     Side effect-likely tardive dyskinesia   Tramadol Nausea And Vomiting   Zolpidem      Sleep walking   Lamotrigine Rash    LAMICTAL   Latex Rash    Latex bandages   Sulfa Antibiotics Rash    Family History: Family History  Problem Relation Age of Onset   Heart failure Father    Parkinson's disease Mother    Bipolar disorder Mother    Depression Sister    Schizophrenia Paternal Grandmother    Diabetes Maternal Grandmother    Pancreatic cancer Maternal Grandmother    Cancer Neg Hx    Heart disease Neg Hx     Breast cancer Neg Hx     Social History:  reports that she has never smoked. She has quit using smokeless tobacco. She reports that she does not drink alcohol  and does not use drugs.   Physical Exam: BP 128/76 (BP Location: Left Arm, Patient Position: Sitting, Cuff Size: Normal)   Pulse 92   Ht 4' 11 (1.499 m)   Wt 100 lb 6.4 oz (45.5 kg)   BMI 20.28 kg/m   Constitutional:  Well nourished. Alert and oriented, No acute distress. HEENT: [place] AT, moist mucus membranes.  Trachea midline Cardiovascular: No clubbing, cyanosis, or edema. Respiratory: Normal respiratory effort, no increased work of breathing. Neurologic: Grossly intact, no focal deficits, moving all 4 extremities. Psychiatric: Normal mood and affect.    Laboratory data: See Epic and HPI I have reviewed the labs.   Pertinent Imaging: N/A  Assessment & Plan:    1. rUTI's - asymptomatic at today's visit  2. Incontinence  - She is at goal with the Detrol  LA 4 mg daily -Continue Detrol  LA 4 mg daily  3. High risk hematuria - no reports of gross heme  4. Vaginal atrophy - Explained that vaginal estrogen cream can actually help prevent UTIs, she states that she feels the frequency of her UTIs is actually diminishing, but if it should start to become troublesome for her again, she is open to restarting the vaginal estrogen cream, she will reach out when she desires a prescription  Return in about 1 year (around 12/07/2024) for  OAB questionnaire.   CLOTILDA CORNWALL, PA-C   [facility] [address], [address] [place], [place] [address] [phone]

## 2023-12-08 ENCOUNTER — Encounter: Payer: Self-pay | Admitting: Urology

## 2023-12-08 ENCOUNTER — Ambulatory Visit: Payer: Self-pay | Admitting: Urology

## 2023-12-08 VITALS — BP 128/76 | HR 92 | Ht 59.0 in | Wt 100.4 lb

## 2023-12-08 DIAGNOSIS — N390 Urinary tract infection, site not specified: Secondary | ICD-10-CM | POA: Diagnosis not present

## 2023-12-08 DIAGNOSIS — R319 Hematuria, unspecified: Secondary | ICD-10-CM

## 2023-12-08 DIAGNOSIS — N952 Postmenopausal atrophic vaginitis: Secondary | ICD-10-CM

## 2023-12-08 DIAGNOSIS — N3941 Urge incontinence: Secondary | ICD-10-CM | POA: Diagnosis not present

## 2023-12-08 LAB — HEMOGLOBIN A1C
Est. average glucose Bld gHb Est-mCnc: 105 mg/dL
Hgb A1c MFr Bld: 5.3 % (ref 4.8–5.6)

## 2023-12-08 LAB — VALPROIC ACID LEVEL: Valproic Acid Lvl: 62 ug/mL (ref 50–100)

## 2023-12-08 LAB — CARBAMAZEPINE LEVEL, TOTAL: Carbamazepine (Tegretol), S: 5.1 ug/mL (ref 4.0–12.0)

## 2023-12-08 LAB — PROLACTIN: Prolactin: 9.5 ng/mL (ref 3.6–25.2)

## 2023-12-08 MED ORDER — TOLTERODINE TARTRATE ER 4 MG PO CP24
4.0000 mg | ORAL_CAPSULE | Freq: Every day | ORAL | 3 refills | Status: AC
Start: 2023-12-08 — End: ?

## 2023-12-09 ENCOUNTER — Ambulatory Visit: Payer: Self-pay | Admitting: Psychiatry

## 2023-12-09 NOTE — Progress Notes (Signed)
 Called patient to inform of the lab results she voiced understanding

## 2023-12-09 NOTE — Telephone Encounter (Signed)
 All labs resulted including prolactin level, valproic acid  level, hemoglobin A1c, carbamazepine  level are within acceptable range.

## 2023-12-21 ENCOUNTER — Other Ambulatory Visit: Payer: Self-pay | Admitting: Psychiatry

## 2023-12-21 DIAGNOSIS — F3160 Bipolar disorder, current episode mixed, unspecified: Secondary | ICD-10-CM

## 2023-12-21 DIAGNOSIS — G2401 Drug induced subacute dyskinesia: Secondary | ICD-10-CM

## 2023-12-29 ENCOUNTER — Other Ambulatory Visit: Payer: Self-pay | Admitting: Psychiatry

## 2023-12-29 DIAGNOSIS — F5101 Primary insomnia: Secondary | ICD-10-CM

## 2024-01-05 ENCOUNTER — Other Ambulatory Visit: Payer: Self-pay

## 2024-01-05 ENCOUNTER — Ambulatory Visit (INDEPENDENT_AMBULATORY_CARE_PROVIDER_SITE_OTHER): Admitting: Psychiatry

## 2024-01-05 ENCOUNTER — Encounter: Payer: Self-pay | Admitting: Psychiatry

## 2024-01-05 VITALS — BP 145/88 | HR 78 | Temp 97.2°F | Ht 59.0 in | Wt 99.0 lb

## 2024-01-05 DIAGNOSIS — G2401 Drug induced subacute dyskinesia: Secondary | ICD-10-CM | POA: Diagnosis not present

## 2024-01-05 DIAGNOSIS — F411 Generalized anxiety disorder: Secondary | ICD-10-CM

## 2024-01-05 DIAGNOSIS — F5101 Primary insomnia: Secondary | ICD-10-CM | POA: Diagnosis not present

## 2024-01-05 DIAGNOSIS — F3178 Bipolar disorder, in full remission, most recent episode mixed: Secondary | ICD-10-CM

## 2024-01-05 NOTE — Progress Notes (Addendum)
 BH MD OP Progress Note  01/05/2024 11:37 AM Stephanie [name]  MRN:  [id number]  Chief Complaint:  Chief Complaint  Patient presents with   Follow-up   Anxiety   Depression   Medication Refill   Discussed the use of AI scribe software for clinical note transcription with the patient, who gave verbal consent to proceed.  History of Present Illness Stephanie [name] is a 78 year old Caucasian female, divorced, lives in [place], has a history of bipolar disorder, GAD, primary insomnia, memory loss, hypothyroidism, coronary artery disease, IBS, hyperlipidemia, fibromyalgia, GERD was evaluated in office today for a follow-up appointment.  She appeared to be pleasant and cheerful today and was able to walk to the appointment with minimal support, does have a cane just in case with her.  She reports improvement in mood stability following recent medication adjustments for her history of manic and depressive episodes. After previously experiencing a manic episode followed by a depressive episode,  Seroquel  was increased and Tegretol  decreased, which she feels has made a significant positive difference, particularly as she enters the fall and winter seasons when her seasonal affective symptoms typically worsens. She states that she has not noticed any withdrawal symptoms or negative effects from the lower dose of Tegretol  (200 mg twice daily) and feels that the medication changes have helped her manage her mood. She denies feeling too depressed or manic and describes herself as stable at this time.  Regarding temazepam , she does not take it every night, instead using it about every other day, as she remains aware of the risk of dizziness, especially in older adults. She acknowledges receiving a new batch of temazepam  but has not opened it and is considering further reducing her use. She has not noticed significant dizziness, drowsiness, or instability with the current regimen, which includes Seroquel ,  Tegretol , and other medications such as Depakote  and gabapentin , as discussed with her provider.  She maintains engagement in activities and social support, including attending church with the help of church members who provide transportation and participating in [employer] study. Her son and daughter remain supportive, though she does not see them as often as she would like. 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 She expresses optimism about her current mental health and the impact of recent medication changes.  She denies any suicidality, homicidality or perceptual disturbances.  She engages in bike riding at home and physical therapy exercises. She has not resumed Tai Chi or returned to [employer] since a recent fall.  She appeared to be alert, oriented to person place time situation.  3 word memory immediate 3 out of 3, after 5 minutes 3 out of 3.  She was able to do simple calculation, serial sevens without any difficulty.  Her attention and focus seem to be good.  She recently saw cardiology and reports no concerns.    Visit Diagnosis:    ICD-10-CM   1. Bipolar disorder, in full remission, most recent episode mixed  F31.78    Type I    2. GAD (generalized anxiety disorder)  F41.1     3. Primary insomnia  F51.01     4. Tardive dyskinesia  G24.01       Past Psychiatric History: I have reviewed past psychiatric history from progress note on 06/17/2017.  Inpatient behavioral health admission at [facility]-01/02/2020 - 01/11/2020.  Past Medical History:  Past Medical History:  Diagnosis Date   Anginal pain    Anxiety    Bipolar 1 disorder (HCC)    Bipolar disorder (HCC)    Coronary artery disease    Depression    Fatigue    Fibromyalgia    GERD (gastroesophageal reflux disease)     Heart disease    Heart failure (HCC)    HLD (hyperlipidemia)    HOH (hard of hearing)    Hypothyroidism    IBS (irritable bowel syndrome)    Pelvic pain in female    Perianal lesion    high grade sil lesion- keratinizing type   RA (rheumatoid arthritis) (HCC)    Raynaud phenomenon    Sjogren's disease    Sleep apnea    Vaginal Pap smear, abnormal     Past Surgical History:  Procedure Laterality Date   CATARACT EXTRACTION W/PHACO Left 02/23/2017   Procedure: CATARACT EXTRACTION PHACO AND INTRAOCULAR LENS PLACEMENT (IOC);  Surgeon: Jaye Fallow, MD;  Location: [facility] ORS;  Service: Ophthalmology;  Laterality: Left;  US  00:35 AP% 9.6 CDE 3.47 Fluid pack lot # [id number]   CATARACT EXTRACTION W/PHACO Right 03/30/2017   Procedure: CATARACT EXTRACTION PHACO AND INTRAOCULAR LENS PLACEMENT (IOC);  Surgeon: Jaye Fallow, MD;  Location: [facility] ORS;  Service: Ophthalmology;  Laterality: Right;  US  00:32.6 AP% 13.8 CDE 4.51 Fluid Pack Lot # [id number]   EYE SURGERY     HYSTEROSCOPY WITH D & C N/A 12/22/2021   Procedure: DILATATION AND CURETTAGE /HYSTEROSCOPY;  Surgeon: Connell Davies, MD;  Location: [facility] ORS;  Service: Gynecology;  Laterality: N/A;   TONSILLECTOMY      Family Psychiatric History: I have reviewed family psychiatric history from progress note on 06/17/2017.  Family History:  Family History  Problem Relation Age of Onset   Heart failure Father    Parkinson's disease Mother    Bipolar disorder Mother    Depression Sister    Schizophrenia Paternal Grandmother    Diabetes Maternal Grandmother    Pancreatic cancer Maternal Grandmother    Cancer Neg Hx    Heart disease Neg Hx    Breast cancer Neg Hx     Social History: I have reviewed social history from progress note on 06/17/2017. Social History   Socioeconomic History   Marital status: Divorced    Spouse name: Not on file   Number of children: 2   Years of education: Not on file   Highest education level:  Master's degree (e.g., MA, MS, MEng, MEd, MSW, MBA)  Occupational History    Comment: retired2  Tobacco Use   Smoking status: Never   Smokeless tobacco: Former  Building Services Engineer status: Never Used  Substance and Sexual Activity   Alcohol use: No    Alcohol/week: 0.0 standard drinks of alcohol   Drug use: No   Sexual activity: Never    Birth control/protection: Post-menopausal  Other Topics Concern   Not on file  Social History Narrative   Lives alone   Social Drivers of Health   Financial Resource Strain: Low Risk  (12/29/2023)   Received from [facility]   Overall Financial Resource Strain (CARDIA)    Difficulty of Paying Living Expenses: Not hard at all  Food Insecurity: No Food Insecurity (12/29/2023)   [facility]   Hunger Vital Sign    Within the past 12 months, you worried that your food would run  out before you got the money to buy more.: Never true    Within the past 12 months, the food you bought just didn't last and you didn't have money to get more.: Never true  Transportation Needs: No Transportation Needs (12/29/2023)   Received from [facility] - Transportation    In the past 12 months, has lack of transportation kept you from medical appointments or from getting medications?: No    Lack of Transportation (Non-Medical): No  Physical Activity: Inactive (01/26/2017)   Exercise Vital Sign    Days of Exercise per Week: 0 days    Minutes of Exercise per Session: 0 min  Stress: No Stress Concern Present (01/26/2017)   [employer] of Occupational Health - Occupational Stress Questionnaire    Feeling of Stress : Only a little  Social Connections: Unknown (01/26/2017)   Social Connection and Isolation Panel    Frequency of Communication with Friends and Family: Not on file    Frequency of Social Gatherings with Friends and Family: Not on file    Attends Religious Services: Never     Active Member of Clubs or Organizations: No    Attends Banker Meetings: Never    Marital Status: Divorced    Allergies:  Allergies  Allergen Reactions   Amoxicillin-Pot Clavulanate Nausea Only and Other (See Comments)    Diarrhea Has patient had a PCN reaction causing immediate rash, facial/tongue/throat swelling, SOB or lightheadedness with hypotension:  Has patient had a PCN reaction causing severe rash involving mucus membranes or skin necrosis: Has patient had a PCN reaction that required hospitalization:  Has patient had a PCN reaction occurring within the last 10 years: If all of the above answers are NO, then may proceed with Cephalosporin use.    Oxycodone-Acetaminophen  Nausea Only   Erythromycin Nausea Only   Azithromycin Nausea And Vomiting   Codeine    Macrolides And Ketolides    Oxycodone-Acetaminophen     Penicillins Other (See Comments)    Has patient had a PCN reaction causing immediate rash, facial/tongue/throat swelling, SOB or lightheadedness with hypotension: NO Has patient had a PCN reaction causing severe rash involving mucus membranes or skin necrosis: NO Has patient had a PCN reaction that required hospitalization: NO Has patient had a PCN reaction occurring within the last 10 years: NO If all of the above answers are NO, then may proceed with Cephalosporin use.    Remeron  [Mirtazapine ]     Side effect-likely tardive dyskinesia   Tramadol Nausea And Vomiting   Zolpidem      Sleep walking   Lamotrigine Rash    LAMICTAL   Latex Rash    Latex bandages   Sulfa Antibiotics Rash    Metabolic Disorder Labs: Lab Results  Component Value Date   HGBA1C 5.3 12/07/2023   Lab Results  Component Value Date   PROLACTIN 9.5 12/07/2023   PROLACTIN 33.0 (H) 11/22/2017   Lab Results  Component Value Date   CHOL 222 (H) 01/02/2020   TRIG 190 (H) 01/02/2020   HDL 69 01/02/2020   CHOLHDL 3.2 01/02/2020   VLDL 38 01/02/2020   LDLCALC 115  (H) 01/02/2020   LDLCALC 119 (H) 11/22/2017   Lab Results  Component Value Date   TSH 2.678 01/02/2020    Therapeutic Level Labs: No results found for: LITHIUM Lab Results  Component Value Date   VALPROATE 62 12/07/2023   VALPROATE 67 11/17/2022   Lab Results  Component Value Date  CBMZ 5.1 12/07/2023   CBMZ 7.8 11/17/2022    Current Medications: Current Outpatient Medications  Medication Sig Dispense Refill   Acetaminophen  500 MG capsule Take 1,000 mg by mouth in the morning, at noon, and at bedtime. tid     aspirin  81 MG chewable tablet Chew 81 mg by mouth daily with breakfast.     Calcium  Citrate-Vitamin D  200-250 MG-UNIT TABS Take 1 tablet by mouth daily after breakfast.      carbamazepine  (TEGRETOL  XR) 200 MG 12 hr tablet Take 1 tablet (200 mg total) by mouth 2 (two) times daily. Dose change 180 tablet 0   divalproex  (DEPAKOTE  ER) 500 MG 24 hr tablet TAKE ONE TABLET BY MOUTH TWICE A DAY 180 tablet 1   ezetimibe (ZETIA) 10 MG tablet Take 1 tablet by mouth daily.     fluticasone  (FLONASE ) 50 MCG/ACT nasal spray      folic acid  (FOLVITE ) 1 MG tablet Take 2 mg by mouth every morning.     gabapentin  (NEURONTIN ) 300 MG capsule TAKE 1 CAPSULE BY MOUTH TWICE DAILY 180 capsule 3   hydroxychloroquine (PLAQUENIL) 200 MG tablet Take 200 mg by mouth daily.     hypromellose (GENTEAL) 0.3 % GEL ophthalmic ointment Apply to eye.     ibandronate  (BONIVA ) 150 MG tablet every 30 (thirty) days.     levothyroxine  (SYNTHROID , LEVOTHROID) 75 MCG tablet Take 75 mcg by mouth daily before breakfast.      methotrexate  2.5 MG tablet Take 15 mg by mouth every Wednesday. WEDNESDAYS AT LUNCH- takes differently     MNEXSPIKE 10 MCG/0.2ML SUSY      mometasone  (ELOCON ) 0.1 % cream Apply topically.     Multiple Vitamins-Minerals (MULTIVITAMIN WITH MINERALS) tablet Take 1 tablet by mouth daily with breakfast.      naproxen  sodium (ALEVE ) 220 MG tablet Take 220 mg by mouth daily as needed.      nitroGLYCERIN  (NITROSTAT ) 0.4 MG SL tablet Place under the tongue.     omeprazole (PRILOSEC OTC) 20 MG tablet Take 1 tablet (20 mg total) by mouth daily for 14 days. 14 tablet 0   omeprazole (PRILOSEC) 20 MG capsule Take 20 mg by mouth daily.     pravastatin  (PRAVACHOL ) 40 MG tablet Take by mouth.     Propylene Glycol 0.6 % SOLN Apply to eye. Takes 4 times a day, systane     QUEtiapine  (SEROQUEL  XR) 400 MG 24 hr tablet Take 1 tablet (400 mg total) by mouth at bedtime. Stop the 300 mg and 50 mg 90 tablet 0   saccharomyces boulardii (FLORASTOR) 250 MG capsule Take 250 mg by mouth.     temazepam  (RESTORIL ) 7.5 MG capsule TAKE ONE CAPSULE (7.5 MG TOTAL) BY MOUTH AT BEDTIME AS NEEDED FOR SLEEP. 30 capsule 5   tolterodine  (DETROL  LA) 4 MG 24 hr capsule Take 1 capsule (4 mg total) by mouth daily. 90 capsule 3   No current facility-administered medications for this visit.     Musculoskeletal: Strength & Muscle Tone: within normal limits Gait & Station: normal, walks with cane Patient leans: N/A  Psychiatric Specialty Exam: Review of Systems  Psychiatric/Behavioral:  The patient is nervous/anxious.     Blood pressure (!) 145/88, pulse 78, temperature (!) 97.2 F (36.2 C), temperature source Temporal, height 4' 11 (1.499 m), weight 99 lb (44.9 kg).Body mass index is 20 kg/m.  General Appearance: Casual  Eye Contact:  Fair  Speech:  Clear and Coherent  Volume:  Normal  Mood:  Anxious situational, coping well  Affect:  Congruent  Thought Process:  Goal Directed and Descriptions of Associations: Intact  Orientation:  Full (Time, Place, and Person)  Thought Content: Logical   Suicidal Thoughts:  No  Homicidal Thoughts:  No  Memory:  Immediate;   Fair Recent;   Fair Remote;   limited  Judgement:  Fair  Insight:  Fair  Psychomotor Activity:  Normal  Concentration:  Concentration: Fair and Attention Span: Fair  Recall:  [employer] of Knowledge: Fair  Language: Fair  Akathisia:  No   Handed:  Right  AIMS (if indicated): done  Assets:  Communication Skills Desire for Improvement Housing Social Support Transportation  ADL's:  Intact  Cognition: WNL  Sleep:  Fair   Screenings: Midwife Visit from 01/05/2024 in [facility] Office Visit from 11/24/2023 in [facility] Office Visit from 09/16/2023 in [facility] Office Visit from 05/27/2023 in [facility] Office Visit from 11/12/2022 in [facility]  AIMS Total Score 0 0 0 0 0   AUDIT    Flowsheet Row Admission (Discharged) from 01/02/2020 in [facility]  Alcohol Use Disorder Identification Test Final Score (AUDIT) 0   GAD-7    Flowsheet Row Office Visit from 01/05/2024 in [facility] Office Visit from 09/16/2023 in [facility] Office Visit from 05/27/2023 in [facility] Office Visit from 04/29/2022 in [facility] Office Visit from 01/26/2022 in [facility]  Total GAD-7 Score 6 5 11 7 4    PHQ2-9    Flowsheet Row Office Visit from 01/05/2024 in [facility] Office Visit from 09/16/2023 in [facility] Office Visit from 06/09/2023 in [facility] Office Visit from 05/27/2023 in [facility] Office Visit from 06/29/2022 in [facility]  PHQ-2 Total Score 0 0 2 4 0  PHQ-9 Total Score -- -- 8 16 5    Flowsheet Row Office Visit from 01/05/2024 in [facility]  ED from 12/06/2023 in [facility] Visit from 11/24/2023 in [facility]  C-SSRS RISK CATEGORY No Risk No Risk No Risk     Assessment and Plan: Stephanie [name] is a 70 year old Caucasian female who presented for follow-up appointment, discussed assessment and plan as noted below.  1. Bipolar disorder, in full remission, most recent episode mixed Currently reports overall mood symptoms is good with the recent medication changes and denies any side effects. Continue Seroquel  extended release 400 mg daily Continue Tegretol  extended release 200 mg twice daily (5.1-therapeutic dated 12/07/2023) Long-term plan to taper off Tegretol . Continue Depakote  extended release 500 mg twice daily (62-therapeutic dated 12/07/2023) Continue Gabapentin  300 mg twice daily  2. GAD (generalized anxiety disorder)-improving  although situational anxiety overall has been doing well.  She is coping better. Continue CBT with Ms. Ellouise Hummer Continue Gabapentin  300 mg twice daily  3. Primary insomnia-stable Reports sleep is overall good.  Continue Temazepam  7.5 mg at bedtime as needed, advised to use it less frequently since she is on a higher dosage of Seroquel . Reviewed  PMP AWARxE Aware of long-term risk of being  on benzodiazepines as well as being on multiple psychotropics married side effects of falls, dizziness  4. Tardive dyskinesia-per history Currently denies any concerns  I have also reviewed most recent labs including LFT-within normal limits, prolactin level-9.5-within normal limits, hemoglobin A1c-5.3-within normal limits.  Dated 12/07/2023.  Sodium level dated 12/06/2023-139-within normal limits, BUN/creatinine-within normal limits, CBC with differential dated 10/20/2023-platelet count-133-chronically low.  Patient to follow-up with primary care provider for elevated blood pressure reading.   Follow-up Follow-up  in clinic in 2 months or sooner in person.   Collaboration of Care: Collaboration of Care: Referral or follow-up with counselor/therapist AEB encouraged to continue psychotherapy sessions with Ms. Ellouise Hummer, I have coordinated care.  Patient/Guardian was advised Release of Information must be obtained prior to any record release in order to collaborate their care with an outside provider. Patient/Guardian was advised if they have not already done so to contact the registration department to sign all necessary forms in order for us  to release information regarding their care.   Consent: Patient/Guardian gives verbal consent for treatment and assignment of benefits for services provided during this visit. Patient/Guardian expressed understanding and agreed to proceed.   This note was generated in part or whole with voice recognition software. Voice recognition is usually quite accurate but there are transcription errors that can and very often do occur. I apologize for any typographical errors that were not detected and corrected.  ] Maryellen Barth, MD 01/05/2024, 1:28 PM

## 2024-02-15 ENCOUNTER — Other Ambulatory Visit: Payer: Self-pay | Admitting: Psychiatry

## 2024-02-15 DIAGNOSIS — R413 Other amnesia: Secondary | ICD-10-CM

## 2024-02-15 DIAGNOSIS — F5101 Primary insomnia: Secondary | ICD-10-CM

## 2024-02-15 DIAGNOSIS — F3162 Bipolar disorder, current episode mixed, moderate: Secondary | ICD-10-CM

## 2024-02-24 ENCOUNTER — Encounter: Payer: Self-pay | Admitting: Psychiatry

## 2024-02-24 ENCOUNTER — Ambulatory Visit: Admitting: Psychiatry

## 2024-02-24 ENCOUNTER — Other Ambulatory Visit: Payer: Self-pay

## 2024-02-24 VITALS — BP 118/76 | HR 86 | Temp 97.4°F | Ht 59.0 in | Wt 97.0 lb

## 2024-02-24 DIAGNOSIS — F5101 Primary insomnia: Secondary | ICD-10-CM

## 2024-02-24 DIAGNOSIS — G2401 Drug induced subacute dyskinesia: Secondary | ICD-10-CM | POA: Diagnosis not present

## 2024-02-24 DIAGNOSIS — F411 Generalized anxiety disorder: Secondary | ICD-10-CM | POA: Diagnosis not present

## 2024-02-24 DIAGNOSIS — F3176 Bipolar disorder, in full remission, most recent episode depressed: Secondary | ICD-10-CM

## 2024-02-24 MED ORDER — CARBAMAZEPINE ER 200 MG PO TB12: 200.0000 mg | ORAL_TABLET | Freq: Two times a day (BID) | ORAL | 0 refills | Status: AC

## 2024-02-24 NOTE — Progress Notes (Unsigned)
 BH MD OP Progress Note  02/24/2024 12:55 PM Stephanie [name]  MRN:  [id number]  Chief Complaint:  Chief Complaint  Patient presents with   Follow-up   Medication Refill   Anxiety   Insomnia   bipolar disorders   Discussed the use of AI scribe software for clinical note transcription with the patient, who gave verbal consent to proceed.  History of Present Illness Stephanie [name] is a 78 year old Caucasian female, divorced, lives in [place], has a history of bipolar disorder, GAD, primary insomnia, memory loss, hypothyroidism, coronary artery disease, IBS, hyperlipidemia, fibromyalgia, GERD was evaluated in office today for a follow-up appointment.  Overall, she reports a stable mood and describes doing well, with her caregiver noting improvement in her condition this year. Engaging in activities such as visiting family, attending church and Sunday school when able, and reading the Bible, she identifies these as coping strategies. She describes experiencing stress related to her sister-in-law's worsening Alzheimer's disease and acknowledges that the thought of moving from her long-term home to an assisted living facility feels very stressful and disruptive at this time. She expresses comfort remaining in her current home and values the support from her caregivers.  She states that she continues to see her therapist, Ellouise Hummer, and has rescheduled her next appointment due to a recent respiratory illness.    She appeared to be alert, oriented to person place time situation.  3 word memory immediate 3 out of 3 after 5 minutes 3 out of 3.  She was able to do digits forward and backward, attention and focus seem to be good.  She denies any perceptual disturbances.She denies any thoughts about suicide or harming others.  Regarding sleep, she describes variable sleep quality, noting some nights are better than others. She reports that she stopped Temazepam  entirely,  and now limits daytime  naps to improve nighttime sleep. She currently takes Seroquel  400 mg and carbamazepine , and reports that she has not observed muscle spasms, tremors, stiffness, cramps, tongue movements, or mouth movements. She states that her caregivers have not noticed any abnormal movements.     Visit Diagnosis:    ICD-10-CM   1. Bipolar disorder, in full remission, most recent episode depressed  F31.76     2. GAD (generalized anxiety disorder)  F41.1 carbamazepine  (TEGRETOL  XR) 200 MG 12 hr tablet    3. Primary insomnia  F51.01     4. Tardive dyskinesia  G24.01       Past Psychiatric History: I have reviewed past psychiatric history from progress note on 06/17/2017.  Inpatient behavioral health admissions at [facility]-01/02/2020 - 01/11/2020.  Past Medical History:  Past Medical History:  Diagnosis Date   Anginal pain    Anxiety    Bipolar 1 disorder (HCC)    Bipolar disorder (HCC)    Coronary artery disease    Depression    Fatigue    Fibromyalgia    GERD (gastroesophageal reflux disease)    Heart disease    Heart failure (HCC)    HLD (hyperlipidemia)    HOH (hard of hearing)    Hypothyroidism    IBS (irritable bowel syndrome)    Pelvic pain in female    Perianal lesion    high grade sil lesion- keratinizing type   RA (rheumatoid arthritis) (HCC)    Raynaud phenomenon    Sjogren's disease    Sleep apnea    Vaginal Pap smear, abnormal     Past Surgical History:  Procedure Laterality Date   CATARACT EXTRACTION W/PHACO Left 02/23/2017   Procedure: CATARACT EXTRACTION PHACO AND INTRAOCULAR LENS PLACEMENT (IOC);  Surgeon: Jaye Fallow, MD;  Location: [facility] ORS;  Service: Ophthalmology;  Laterality: Left;  US  00:35 AP% 9.6 CDE 3.47 Fluid pack lot # [id number]   CATARACT EXTRACTION W/PHACO Right 03/30/2017   Procedure: CATARACT EXTRACTION PHACO AND INTRAOCULAR LENS PLACEMENT (IOC);  Surgeon: Jaye Fallow, MD;  Location: [facility] ORS;  Service: Ophthalmology;   Laterality: Right;  US  00:32.6 AP% 13.8 CDE 4.51 Fluid Pack Lot # [id number]   EYE SURGERY     HYSTEROSCOPY WITH D & C N/A 12/22/2021   Procedure: DILATATION AND CURETTAGE /HYSTEROSCOPY;  Surgeon: Connell Davies, MD;  Location: [facility] ORS;  Service: Gynecology;  Laterality: N/A;   TONSILLECTOMY      Family Psychiatric History: I have reviewed family psychiatric history from progress note on 06/17/2017.  Family History:  Family History  Problem Relation Age of Onset   Heart failure Father    Parkinson's disease Mother    Bipolar disorder Mother    Depression Sister    Schizophrenia Paternal Grandmother    Diabetes Maternal Grandmother    Pancreatic cancer Maternal Grandmother    Cancer Neg Hx    Heart disease Neg Hx    Breast cancer Neg Hx     Social History: I have reviewed social history from progress note on 06/17/2017. Social History   Socioeconomic History   Marital status: Divorced    Spouse name: Not on file   Number of children: 2   Years of education: Not on file   Highest education level: Master's degree (e.g., MA, MS, MEng, MEd, MSW, MBA)  Occupational History    Comment: retired2  Tobacco Use   Smoking status: Never   Smokeless tobacco: Former  Building Services Engineer status: Never Used  Substance and Sexual Activity   Alcohol use: No    Alcohol/week: 0.0 standard drinks of alcohol   Drug use: No   Sexual activity: Never    Birth control/protection: Post-menopausal  Other Topics Concern   Not on file  Social History Narrative   Lives alone   Social Drivers of Health   Tobacco Use: Medium Risk (02/24/2024)   Patient History    Smoking Tobacco Use: Never    Smokeless Tobacco Use: Former    Passive Exposure: Not on Actuary Strain: Patient Declined (02/08/2024)   Received from [facility]   Overall Financial Resource Strain (CARDIA)    Difficulty of Paying Living Expenses: Patient declined  Food Insecurity: Patient  Declined (02/08/2024)   Received from [facility]   Epic    Within the past 12 months, you worried that your food would run out before you got the money to buy more.: Patient declined    Within the past 12 months, the food you bought just didn't last and you didn't have money to get more.: Patient declined  Transportation Needs: Patient Declined (02/08/2024)   Received from [facility] - Transportation    In the past 12 months, has lack of transportation kept you from medical appointments or from getting medications?: Patient declined    Lack of Transportation (Non-Medical): Patient declined  Physical Activity: Not on file  Stress: Not on file  Social Connections: Not on file  Depression (PHQ2-9): Low Risk (01/05/2024)   Depression (PHQ2-9)    PHQ-2 Score: 0  Alcohol Screen: Not on  file  Housing: Patient Declined (02/08/2024)   [facility]   Epic    In the last 12 months, was there a time when you were not able to pay the mortgage or rent on time?: Patient declined    In the past 12 months, how many times have you moved where you were living?: 0    At any time in the past 12 months, were you homeless or living in a shelter (including now)?: Patient declined  Utilities: Patient Declined (02/08/2024)   Received from [facility]   Epic    In the past 12 months has the electric, gas, oil, or water company threatened to shut off services in your home?: Patient declined  Health Literacy: Not on file    Allergies: Allergies[1]  Metabolic Disorder Labs: Lab Results  Component Value Date   HGBA1C 5.3 12/07/2023   Lab Results  Component Value Date   PROLACTIN 9.5 12/07/2023   PROLACTIN 33.0 (H) 11/22/2017   Lab Results  Component Value Date   CHOL 222 (H) 01/02/2020   TRIG 190 (H) 01/02/2020   HDL 69 01/02/2020   CHOLHDL 3.2 01/02/2020   VLDL 38 01/02/2020   LDLCALC 115 (H) 01/02/2020    LDLCALC 119 (H) 11/22/2017   Lab Results  Component Value Date   TSH 2.678 01/02/2020    Therapeutic Level Labs: No results found for: LITHIUM Lab Results  Component Value Date   VALPROATE 62 12/07/2023   VALPROATE 67 11/17/2022   Lab Results  Component Value Date   CBMZ 5.1 12/07/2023   CBMZ 7.8 11/17/2022    Current Medications: Current Outpatient Medications  Medication Sig Dispense Refill   Acetaminophen  500 MG capsule Take 1,000 mg by mouth in the morning, at noon, and at bedtime. tid     aspirin  81 MG chewable tablet Chew 81 mg by mouth daily with breakfast.     Calcium  Citrate-Vitamin D  200-250 MG-UNIT TABS Take 1 tablet by mouth daily after breakfast.      carbamazepine  (TEGRETOL  XR) 200 MG 12 hr tablet Take 1 tablet (200 mg total) by mouth 2 (two) times daily. Dose change 180 tablet 0   divalproex  (DEPAKOTE  ER) 500 MG 24 hr tablet TAKE ONE TABLET BY MOUTH TWICE A DAY 180 tablet 1   ezetimibe (ZETIA) 10 MG tablet Take 1 tablet by mouth daily.     fluticasone  (FLONASE ) 50 MCG/ACT nasal spray      folic acid  (FOLVITE ) 1 MG tablet Take 2 mg by mouth every morning.     gabapentin  (NEURONTIN ) 300 MG capsule TAKE 1 CAPSULE BY MOUTH TWICE DAILY 180 capsule 3   hydroxychloroquine (PLAQUENIL) 200 MG tablet Take 200 mg by mouth daily.     hypromellose (GENTEAL) 0.3 % GEL ophthalmic ointment Apply to eye.     ibandronate (BONIVA) 150 MG tablet every 30 (thirty) days.     levothyroxine  (SYNTHROID , LEVOTHROID) 75 MCG tablet Take 75 mcg by mouth daily before breakfast.      methotrexate  2.5 MG tablet Take 15 mg by mouth every Wednesday. WEDNESDAYS AT LUNCH- takes differently     MNEXSPIKE 10 MCG/0.2ML SUSY      mometasone  (ELOCON ) 0.1 % cream Apply topically.     Multiple Vitamins-Minerals (MULTIVITAMIN WITH MINERALS) tablet Take 1 tablet by mouth daily with breakfast.      naproxen  sodium (ALEVE ) 220 MG tablet Take 220 mg by mouth daily as needed.     nitroGLYCERIN (NITROSTAT)  0.4 MG SL tablet Place under the tongue.     omeprazole  (PRILOSEC ) 20 MG capsule Take 20 mg by mouth daily.     pravastatin  (PRAVACHOL ) 40 MG tablet Take by mouth.     Propylene Glycol 0.6 % SOLN Apply to eye. Takes 4 times a day, systane     QUEtiapine  (SEROQUEL  XR) 400 MG 24 hr tablet TAKE 1 TABLET (400 MG TOTAL) BY MOUTH AT BEDTIME. 90 tablet 0   saccharomyces boulardii (FLORASTOR) 250 MG capsule Take 250 mg by mouth.     temazepam  (RESTORIL ) 7.5 MG capsule TAKE ONE CAPSULE (7.5 MG TOTAL) BY MOUTH AT BEDTIME AS NEEDED FOR SLEEP. 30 capsule 5   tolterodine  (DETROL  LA) 4 MG 24 hr capsule Take 1 capsule (4 mg total) by mouth daily. 90 capsule 3   No current facility-administered medications for this visit.     Musculoskeletal: Strength & Muscle Tone: within normal limits Gait & Station: uses a cane Patient leans: N/A  Psychiatric Specialty Exam: Review of Systems  Psychiatric/Behavioral: Negative.      Blood pressure 118/76, pulse 86, temperature (!) 97.4 F (36.3 C), temperature source Temporal, height 4' 11 (1.499 m), weight 97 lb (44 kg).Body mass index is 19.59 kg/m.  General Appearance: Casual  Eye Contact:  Good  Speech:  Clear and Coherent  Volume:  Normal  Mood:  Euthymic  Affect:  Appropriate  Thought Process:  Goal Directed and Descriptions of Associations: Intact  Orientation:  Full (Time, Place, and Person)  Thought Content: Logical   Suicidal Thoughts:  No  Homicidal Thoughts:  No  Memory:  Immediate;   Fair Recent;   Fair Remote;   Limited  Judgement:  Fair  Insight:  Fair  Psychomotor Activity:  Normal  Concentration:  Concentration: Fair and Attention Span: Fair  Recall:  [employer] of Knowledge: Fair  Language: Fair  Akathisia:  No  Handed:  Right  AIMS (if indicated): done  Assets:  Communication Skills Desire for Improvement Housing Social Support Transportation  ADL's:  Intact  Cognition: WNL  Sleep:  Fair   Screenings: Museum/gallery Exhibitions Officer Visit from 01/05/2024 in [facility] Office Visit from 11/24/2023 in [facility] Office Visit from 09/16/2023 in [facility] Office Visit from 05[facility]27/2023 in [facility] Office Visit from 11/12/2022 in [facility]  AIMS Total Score 0 0 0 0 0   AUDIT    Flowsheet Row Admission (Discharged) from 01/02/2020 in [facility]INE  Alcohol Use Disorder Identification Test Final Score (AUDIT) 0   GAD-7    Flowsheet Row Office Visit from 01/05/2024 in [facility] Office Visit from 09/16/2023 in [facility] Office Visit from 05/27/2023 in [facility] Office Visit from 04/29/2022 in [facility] Office Visit from 01/26/2022 in [facility]  Total GAD-7 Score 6 5 11 7 4    PHQ2-9    Flowsheet Row Office Visit from 01/05/2024 in [facility] Office Visit from 09/16/2023 in [facility] Office Visit from 06/09/2023 in [facility] Office Visit from 05/27/2023 in [facility] Office Visit from 06/29/2022 in [facility]  PHQ-2 Total Score 0  0 2 4 0  PHQ-9 Total Score -- -- 8 16 5    Flowsheet Row Office Visit from 02/24/2024 in [facility] Office Visit from 01/05/2024 in [facility] ED from 12/06/2023 in [facility] Emergency Department at [facility]  C-SSRS RISK CATEGORY No Risk No Risk No Risk      Assessment and Plan: TWYLIA OKA is a 78 year old Caucasian female who presented for follow-up appointment, discussed assessment and plan as noted below.  1. Bipolar disorder, in full remission, most recent episode depressed Currently reports overall mood symptoms are stable Continue Seroquel  extended release 400 mg daily Continue Tegretol  extended release 200 mg twice daily (5.1-therapeutic dated 12/07/2023) Long-term plan to taper off Tegretol . Continue Depakote  extended release 500 mg twice daily(62-therapeutic dated 12/07/2023) Continue gabapentin  300 mg twice daily  2. GAD (generalized anxiety disorder)-stable Overall anxiety is well-managed. Continue CBT with Ms. Ellouise Hummer Continue gabapentin  300 mg twice daily  3. Primary insomnia-improving Currently reports sleep is managed by working on sleep hygiene techniques and reducing the quantity of naps during the day. Continue Seroquel  as prescribed Continue temazepam  7.5 mg at bedtime as needed, although available has not been using it Reviewed [place] AWARxE Aware of long-term risk of combining medications which has similar side effects including risk of falls, dizziness, drowsiness.   4. Tardive dyskinesia per history Currently denies any concerns  Follow-up Follow-up in clinic in 3 months or sooner if needed.    Collaboration of Care: Collaboration of Care: Referral or follow-up with counselor/therapist AEB encouraged to continue psychotherapy sessions.  Patient/Guardian was advised Release of Information must be obtained prior to any record release in order to collaborate their care with an outside provider. Patient/Guardian was advised if they have not already done so to contact the registration department to sign all necessary forms in order for us  to release information regarding their care.   Consent: Patient/Guardian gives verbal consent for treatment and assignment of benefits for services provided during  this visit. Patient/Guardian expressed understanding and agreed to proceed.   This note was generated in part or whole with voice recognition software. Voice recognition is usually quite accurate but there are transcription errors that can and very often do occur. I apologize for any typographical errors that were not detected and corrected.    Makiya Jeune, MD 02/24/2024, 12:55 PM     [1]  Allergies Allergen Reactions   Amoxicillin-Pot Clavulanate Nausea Only and Other (See Comments)    Diarrhea Has patient had a PCN reaction causing immediate rash, facial/tongue/throat swelling, SOB or lightheadedness with hypotension:  Has patient had a PCN reaction causing severe rash involving mucus membranes or skin necrosis: Has patient had a PCN reaction that required hospitalization:  Has patient had a PCN reaction occurring within the last 10 years: If all of the above answers are NO, then may proceed with Cephalosporin use.    Oxycodone-Acetaminophen  Nausea Only   Erythromycin Nausea Only   Azithromycin Nausea And Vomiting   Codeine    Macrolides And Ketolides    Oxycodone-Acetaminophen     Penicillins Other (See Comments)    Has patient had a PCN reaction causing immediate rash, facial/tongue/throat swelling, SOB or lightheadedness with hypotension: NO Has patient had a PCN reaction causing severe rash involving mucus membranes or skin necrosis: NO Has patient had a PCN reaction that required hospitalization: NO Has patient had a PCN reaction occurring within the last 10 years: NO If all  of the above answers are NO, then may proceed with Cephalosporin use.    Remeron  [Mirtazapine ]     Side effect-likely tardive dyskinesia   Tramadol Nausea And Vomiting   Zolpidem      Sleep walking   Lamotrigine Rash    LAMICTAL   Latex Rash    Latex bandages   Sulfa Antibiotics Rash

## 2024-06-01 ENCOUNTER — Ambulatory Visit: Admitting: Psychiatry

## 2024-06-29 ENCOUNTER — Ambulatory Visit: Admitting: Dermatology

## 2024-12-07 ENCOUNTER — Ambulatory Visit: Admitting: Urology
